# Patient Record
Sex: Female | Born: 1955 | Race: White | Hispanic: No | Smoking: Former smoker
Health system: Southern US, Community
[De-identification: ages and names within clinical notes are randomized; demographics above are authoritative.]

## PROBLEM LIST (undated history)

## (undated) DIAGNOSIS — G8194 Hemiplegia, unspecified affecting left nondominant side: Secondary | ICD-10-CM

## (undated) DIAGNOSIS — I739 Peripheral vascular disease, unspecified: Secondary | ICD-10-CM

## (undated) DIAGNOSIS — I509 Heart failure, unspecified: Secondary | ICD-10-CM

## (undated) DIAGNOSIS — G919 Hydrocephalus, unspecified: Secondary | ICD-10-CM

## (undated) DIAGNOSIS — I639 Cerebral infarction, unspecified: Secondary | ICD-10-CM

## (undated) DIAGNOSIS — J45909 Unspecified asthma, uncomplicated: Secondary | ICD-10-CM

## (undated) DIAGNOSIS — K5900 Constipation, unspecified: Secondary | ICD-10-CM

## (undated) DIAGNOSIS — I10 Essential (primary) hypertension: Secondary | ICD-10-CM

## (undated) DIAGNOSIS — E079 Disorder of thyroid, unspecified: Secondary | ICD-10-CM

## (undated) DIAGNOSIS — I619 Nontraumatic intracerebral hemorrhage, unspecified: Secondary | ICD-10-CM

## (undated) DIAGNOSIS — C50919 Malignant neoplasm of unspecified site of unspecified female breast: Secondary | ICD-10-CM

## (undated) DIAGNOSIS — K219 Gastro-esophageal reflux disease without esophagitis: Secondary | ICD-10-CM

## (undated) HISTORY — DX: Heart failure, unspecified: I50.9

## (undated) HISTORY — PX: BRAIN SURGERY: SHX531

## (undated) HISTORY — DX: Cerebral infarction, unspecified: I63.9

---

## 2005-12-19 ENCOUNTER — Emergency Department: Payer: Self-pay | Admitting: Emergency Medicine

## 2005-12-19 ENCOUNTER — Other Ambulatory Visit: Payer: Self-pay

## 2006-01-30 ENCOUNTER — Encounter: Payer: Self-pay | Admitting: Neurosurgery

## 2010-12-31 ENCOUNTER — Inpatient Hospital Stay: Payer: Self-pay | Admitting: Surgery

## 2011-01-11 ENCOUNTER — Ambulatory Visit: Payer: Self-pay | Admitting: Internal Medicine

## 2011-01-12 LAB — CANCER ANTIGEN 27.29: CA 27.29: 153.9 U/mL — ABNORMAL HIGH (ref 0.0–38.6)

## 2011-01-16 ENCOUNTER — Ambulatory Visit: Payer: Self-pay | Admitting: Internal Medicine

## 2011-01-23 ENCOUNTER — Ambulatory Visit: Payer: Self-pay | Admitting: Surgery

## 2011-02-10 ENCOUNTER — Ambulatory Visit: Payer: Self-pay | Admitting: Internal Medicine

## 2011-03-13 ENCOUNTER — Ambulatory Visit: Payer: Self-pay | Admitting: Internal Medicine

## 2011-04-13 ENCOUNTER — Ambulatory Visit: Payer: Self-pay | Admitting: Internal Medicine

## 2011-05-13 ENCOUNTER — Ambulatory Visit: Payer: Self-pay | Admitting: Internal Medicine

## 2011-06-13 ENCOUNTER — Ambulatory Visit: Payer: Self-pay | Admitting: Internal Medicine

## 2011-07-09 ENCOUNTER — Ambulatory Visit: Payer: Self-pay | Admitting: Surgery

## 2011-07-09 DIAGNOSIS — I10 Essential (primary) hypertension: Secondary | ICD-10-CM

## 2011-07-15 ENCOUNTER — Ambulatory Visit: Payer: Self-pay | Admitting: Surgery

## 2011-07-18 LAB — PATHOLOGY REPORT

## 2011-10-01 ENCOUNTER — Ambulatory Visit: Payer: Self-pay | Admitting: Internal Medicine

## 2011-10-11 ENCOUNTER — Ambulatory Visit: Payer: Self-pay | Admitting: Internal Medicine

## 2011-10-29 LAB — CBC CANCER CENTER
Basophil #: 0 x10 3/mm (ref 0.0–0.1)
HCT: 40 % (ref 35.0–47.0)
HGB: 13.4 g/dL (ref 12.0–16.0)
Lymphocyte #: 1 x10 3/mm (ref 1.0–3.6)
MCHC: 33.6 g/dL (ref 32.0–36.0)
Monocyte %: 8.6 %
RBC: 4.37 10*6/uL (ref 3.80–5.20)
WBC: 7.1 x10 3/mm (ref 3.6–11.0)

## 2011-11-05 LAB — CBC CANCER CENTER
Eosinophil #: 0.2 x10 3/mm (ref 0.0–0.7)
Eosinophil %: 3.5 %
MCH: 31 pg (ref 26.0–34.0)
Neutrophil #: 4.9 x10 3/mm (ref 1.4–6.5)
Platelet: 318 x10 3/mm (ref 150–440)

## 2011-11-11 ENCOUNTER — Ambulatory Visit: Payer: Self-pay | Admitting: Internal Medicine

## 2011-11-12 LAB — CBC CANCER CENTER
Eosinophil #: 0.2 x10 3/mm (ref 0.0–0.7)
Lymphocyte %: 16.6 %
Monocyte %: 9.4 %
Neutrophil %: 69.5 %
Platelet: 340 x10 3/mm (ref 150–440)
WBC: 6.1 x10 3/mm (ref 3.6–11.0)

## 2011-12-11 ENCOUNTER — Ambulatory Visit: Payer: Self-pay | Admitting: Internal Medicine

## 2012-10-09 IMAGING — PT NM PET TUM IMG RESTAG (PS) SKULL BASE T - THIGH
5 series · 23 of 25 positions shown · non-contrast
Comparison: none

REASON FOR EXAM: left breast cancer
COMMENTS:

[Series 3: ct wb 3.0 b30f · axial · 3.0mm · 0.98mm/px · z∈[-1355,-487]mm · 8 of 435 slices shown]
[im 1/435]
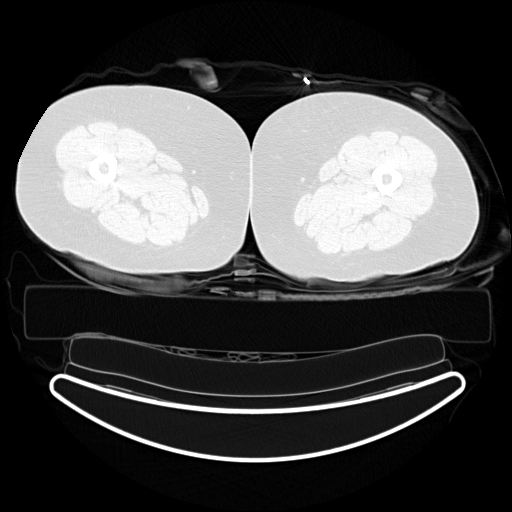
[im 55/435]
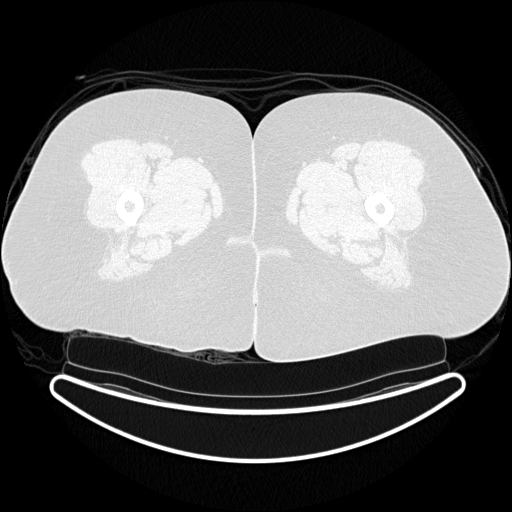
[im 109/435]
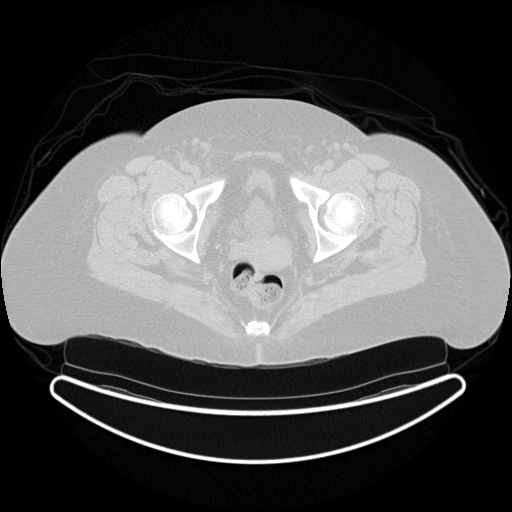
[im 163/435]
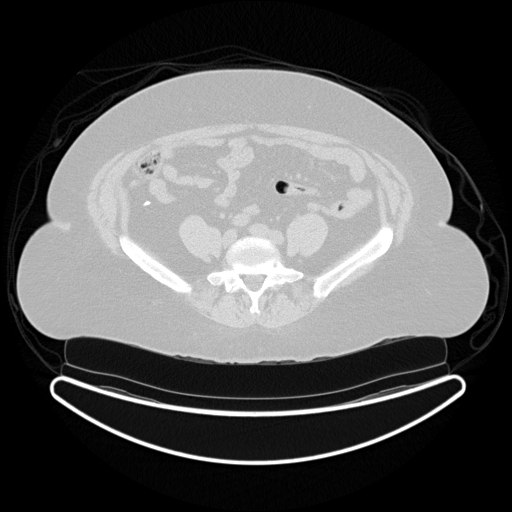
[im 218/435]
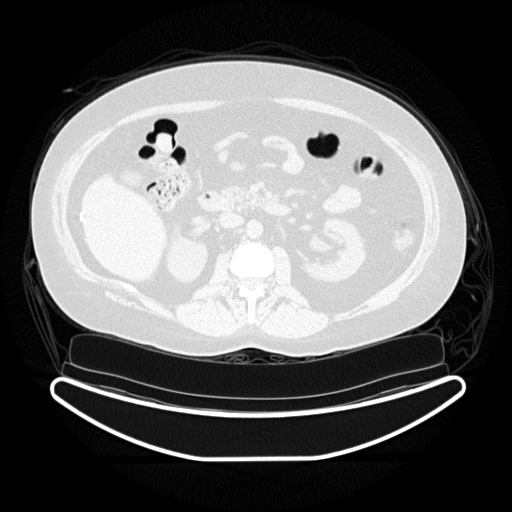
[im 272/435]
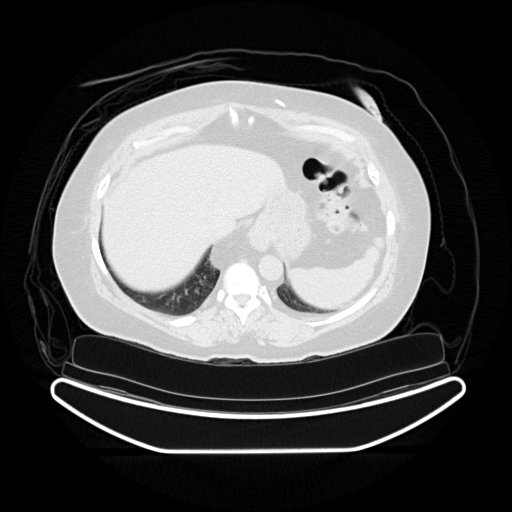
[im 380/435]
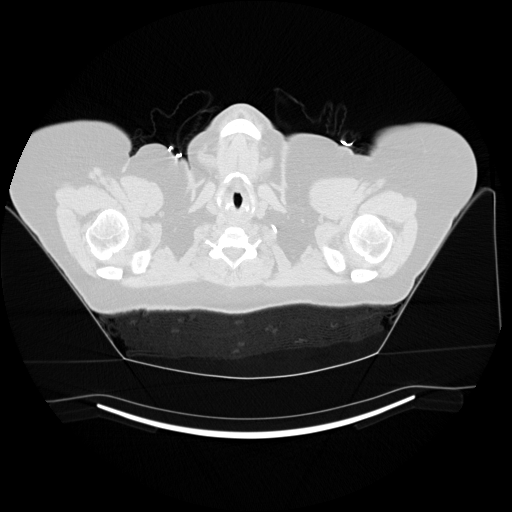
[im 435/435]
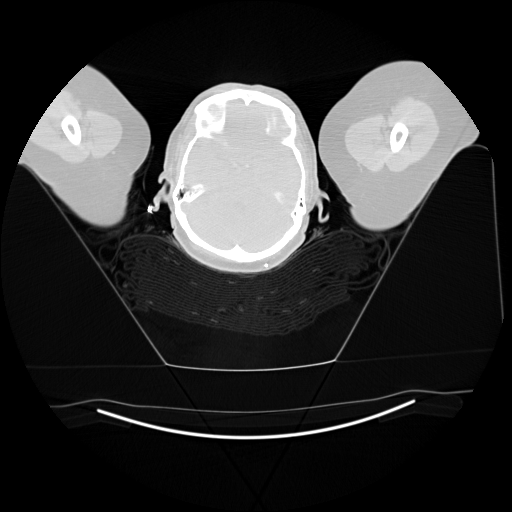

[Series 102: pet wb · axial · 5.0mm · 4.07mm/px · z∈[-1354,-487]mm · 7 of 290 slices shown]
[im 1/290]
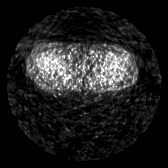
[im 49/290]
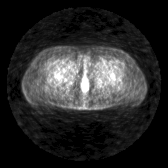
[im 97/290]
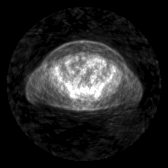
[im 145/290]
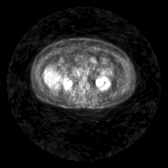
[im 193/290]
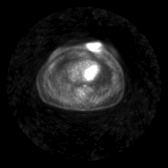
[im 241/290]
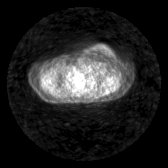
[im 290/290]
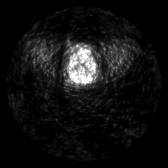

[Series 803: pet axial · 3 of 168 slices shown]
[im 1/168]
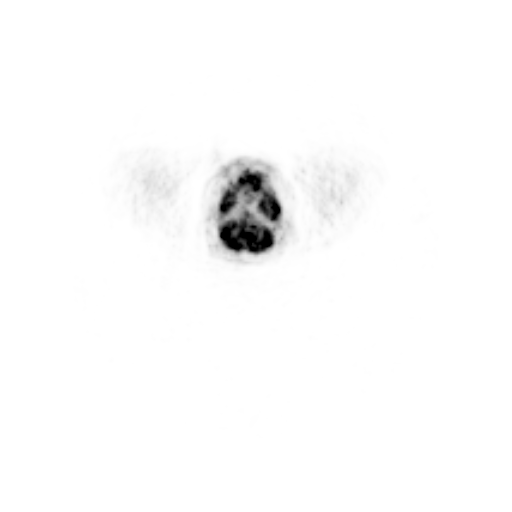
[im 56/168]
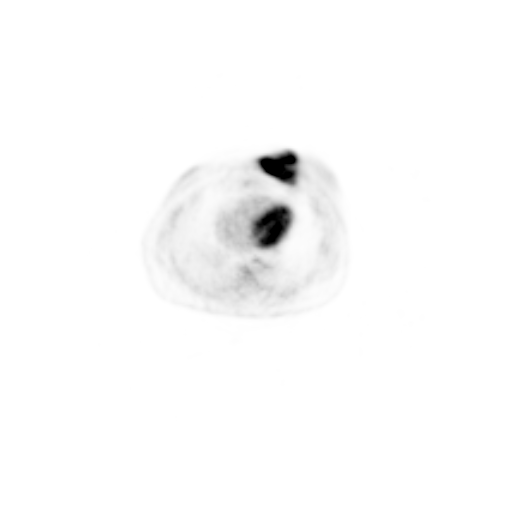
[im 168/168]
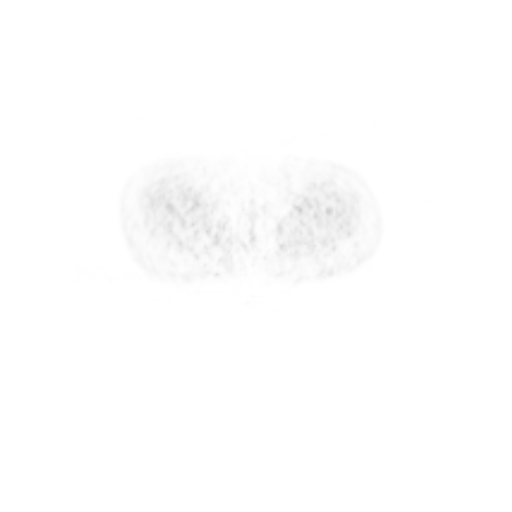

[Series 804: pet coronal · 2 of 74 slices shown]
[im 1/74]
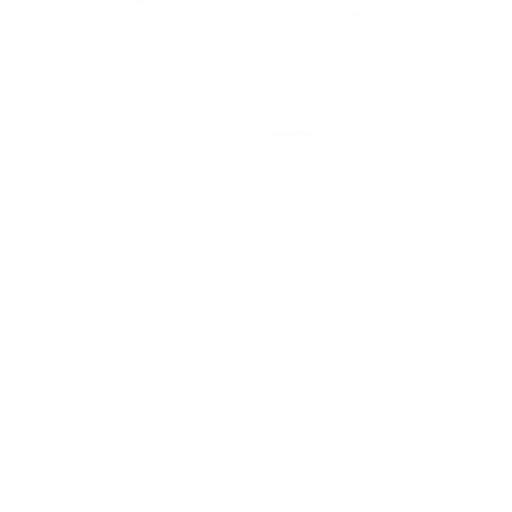
[im 74/74]
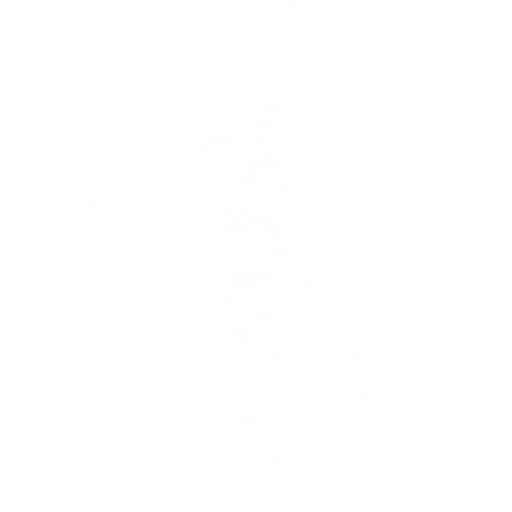

[Series 805: pet sagittal · 3 of 112 slices shown]
[im 1/112]
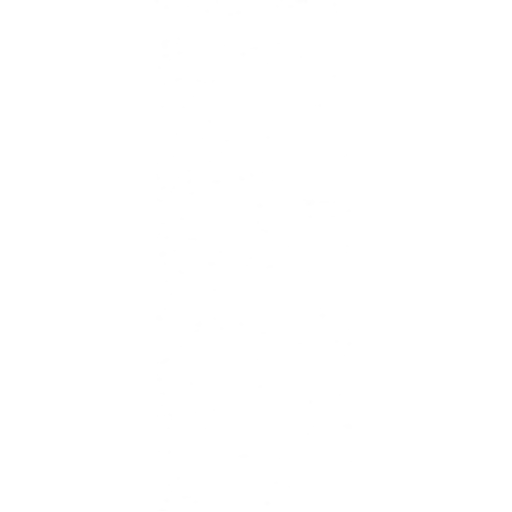
[im 56/112]
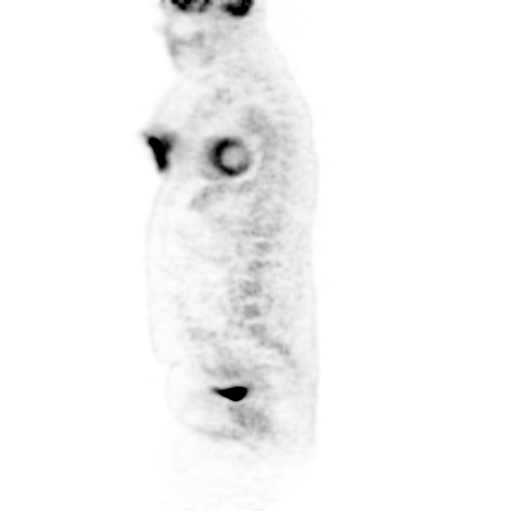
[im 112/112]
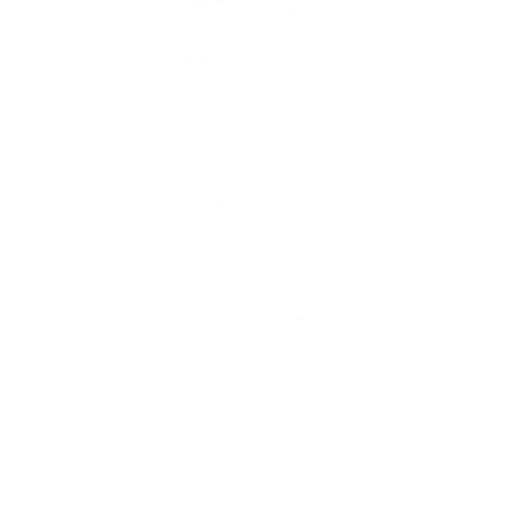

[23 of 25 positions shown; findings below may reference images not displayed]

PROCEDURE:     PET - PET/CT RESTG BREAST CA  - January 16, 2011 [DATE]

RESULT:     The patient is undergoing staging of breast malignancy. The
patient underwent biopsy 10 days ago on the left. The patient's fasting
blood glucose level was 100 mg/dL. The patient received 12.9 mCi of F-18
labeled FDG at [DATE] a.m. Scanning was then performed from [DATE] to [DATE]
a.m. A noncontrast CT scan was performed at the same sitting for
coregistration attenuation correction.

Uptake of the radiopharmaceutical within the neck is normal.

The left breast is enlarged and there is a large mass associated with the
medial aspect of the breast. This mass avidly accumulates the
radiopharmaceutical. A central area of photopenia may reflect the presence
of necrosis. The maximal SUV value demonstrated here is 19 with a mean of
16. I do not see abnormal uptake of the radiopharmaceutical elsewhere in the
left breast nor within the normal sized right breast. No abnormal uptake in
the axilla is demonstrated. Within the thoracic cavity I see no abnormal
uptake in the mediastinum or hilar regions nor pulmonary parenchymal.

Within the abdomen and pelvis normal expected activity within the urinary
bladder and kidneys is seen. There is a tiny focus to the right of the
urinary bladder posteriorly in the pelvis which does not clearly correspond
to a lymph node. It exhibits SUV of 9.6 maximally and 6.7 as a mean. This
likely reflects activity within the distal ureter.

On the CT images the thyroid gland is seen to be heterogeneous in density. I
see no mediastinal or hilar lymphadenopathy nor pleural nor pericardial
effusion. There are no adrenal masses. The kidney are normal in appearance.
The liver and gallbladder exhibit no acute abnormality. I see no periaortic
or pericaval lymphadenopathy. There is no evidence of ascites.
IMPRESSION: 1. There is markedly abnormal uptake within the known mass within the left
breast. I do not see definite abnormal uptake elsewhere within the body. A
tiny focus of increased uptake within the right aspect of the pelvis is
nonspecific and may be related to the distal right ureter.
2. I do not see evidence of acute abnormality on the CT images elsewhere in
the chest or abdomen or pelvis. There is nodularity within the normal size
thyroid gland.

## 2012-11-05 ENCOUNTER — Inpatient Hospital Stay (HOSPITAL_COMMUNITY)
Admission: EM | Admit: 2012-11-05 | Discharge: 2012-11-09 | DRG: 556 | Disposition: A | Discharge status: Rehabilitation Facility | Payer: Medicaid Other | Attending: Internal Medicine | Admitting: Internal Medicine

## 2012-11-05 ENCOUNTER — Observation Stay (HOSPITAL_COMMUNITY): Payer: Medicaid Other

## 2012-11-05 ENCOUNTER — Emergency Department (HOSPITAL_COMMUNITY): Payer: Medicaid Other

## 2012-11-05 ENCOUNTER — Encounter (HOSPITAL_COMMUNITY): Payer: Self-pay | Admitting: Emergency Medicine

## 2012-11-05 DIAGNOSIS — I6350 Cerebral infarction due to unspecified occlusion or stenosis of unspecified cerebral artery: Secondary | ICD-10-CM

## 2012-11-05 DIAGNOSIS — E669 Obesity, unspecified: Secondary | ICD-10-CM | POA: Diagnosis present

## 2012-11-05 DIAGNOSIS — Z9889 Other specified postprocedural states: Secondary | ICD-10-CM

## 2012-11-05 DIAGNOSIS — Z8679 Personal history of other diseases of the circulatory system: Secondary | ICD-10-CM

## 2012-11-05 DIAGNOSIS — R29898 Other symptoms and signs involving the musculoskeletal system: Principal | ICD-10-CM | POA: Diagnosis present

## 2012-11-05 DIAGNOSIS — M6281 Muscle weakness (generalized): Secondary | ICD-10-CM

## 2012-11-05 DIAGNOSIS — Z982 Presence of cerebrospinal fluid drainage device: Secondary | ICD-10-CM

## 2012-11-05 DIAGNOSIS — R4789 Other speech disturbances: Secondary | ICD-10-CM | POA: Diagnosis present

## 2012-11-05 DIAGNOSIS — J45909 Unspecified asthma, uncomplicated: Secondary | ICD-10-CM | POA: Diagnosis not present

## 2012-11-05 DIAGNOSIS — R531 Weakness: Secondary | ICD-10-CM | POA: Diagnosis present

## 2012-11-05 DIAGNOSIS — I10 Essential (primary) hypertension: Secondary | ICD-10-CM | POA: Diagnosis present

## 2012-11-05 DIAGNOSIS — K219 Gastro-esophageal reflux disease without esophagitis: Secondary | ICD-10-CM | POA: Diagnosis present

## 2012-11-05 DIAGNOSIS — I639 Cerebral infarction, unspecified: Secondary | ICD-10-CM

## 2012-11-05 DIAGNOSIS — Z853 Personal history of malignant neoplasm of breast: Secondary | ICD-10-CM

## 2012-11-05 HISTORY — DX: Malignant neoplasm of unspecified site of unspecified female breast: C50.919

## 2012-11-05 HISTORY — DX: Essential (primary) hypertension: I10

## 2012-11-05 HISTORY — DX: Unspecified asthma, uncomplicated: J45.909

## 2012-11-05 HISTORY — DX: Hydrocephalus, unspecified: G91.9

## 2012-11-05 LAB — BASIC METABOLIC PANEL
BUN: 11 mg/dL (ref 6–23)
CO2: 25 mEq/L (ref 19–32)
Chloride: 108 mEq/L (ref 96–112)
GFR calc non Af Amer: 69 mL/min — ABNORMAL LOW (ref >90)
Glucose, Bld: 95 mg/dL (ref 70–99)
Potassium: 3.9 mEq/L (ref 3.5–5.1)
Sodium: 144 mEq/L (ref 135–145)

## 2012-11-05 LAB — CBC
Hemoglobin: 12.2 g/dL (ref 12.0–15.0)
MCH: 31.6 pg (ref 26.0–34.0)
MCHC: 33.6 g/dL (ref 30.0–36.0)
MCV: 94 fL (ref 78.0–100.0)
RBC: 3.86 MIL/uL — ABNORMAL LOW (ref 3.87–5.11)

## 2012-11-05 LAB — CBC WITH DIFFERENTIAL/PLATELET
Eosinophils Absolute: 0.1 10*3/uL (ref 0.0–0.7)
HCT: 41.7 % (ref 36.0–46.0)
Hemoglobin: 14 g/dL (ref 12.0–15.0)
Lymphs Abs: 1.7 10*3/uL (ref 0.7–4.0)
MCH: 31.7 pg (ref 26.0–34.0)
MCHC: 33.6 g/dL (ref 30.0–36.0)
MCV: 94.6 fL (ref 78.0–100.0)
Monocytes Absolute: 0.6 10*3/uL (ref 0.1–1.0)
Monocytes Relative: 6 % (ref 3–12)
Neutrophils Relative %: 73 % (ref 43–77)
RBC: 4.41 MIL/uL (ref 3.87–5.11)

## 2012-11-05 LAB — TROPONIN I: Troponin I: <0.3 ng/mL (ref <0.30)

## 2012-11-05 MED ORDER — METOPROLOL SUCCINATE ER 50 MG PO TB24
50.0000 mg | ORAL_TABLET | Freq: Every morning | ORAL | Status: DC
  Administered 2012-11-06 – 2012-11-09 (×4): 50 mg via ORAL
  Filled 2012-11-05 (×5): qty 1

## 2012-11-05 MED ORDER — ASPIRIN 325 MG PO TABS
325.0000 mg | ORAL_TABLET | Freq: Every day | ORAL | Status: DC
  Administered 2012-11-05 – 2012-11-09 (×5): 325 mg via ORAL
  Filled 2012-11-05 (×6): qty 1

## 2012-11-05 MED ORDER — IPRATROPIUM-ALBUTEROL 18-103 MCG/ACT IN AERO
2.0000 | INHALATION_SPRAY | Freq: Four times a day (QID) | RESPIRATORY_TRACT | Status: DC | PRN
  Filled 2012-11-05: qty 14.7

## 2012-11-05 MED ORDER — PANTOPRAZOLE SODIUM 40 MG PO TBEC
40.0000 mg | DELAYED_RELEASE_TABLET | Freq: Every day | ORAL | Status: DC
  Administered 2012-11-06 – 2012-11-09 (×4): 40 mg via ORAL
  Filled 2012-11-05 (×5): qty 1

## 2012-11-05 MED ORDER — ONDANSETRON HCL 4 MG/2ML IJ SOLN: 4.0000 mg | Freq: Three times a day (TID) | INTRAMUSCULAR | Status: AC | PRN

## 2012-11-05 MED ORDER — ASPIRIN 300 MG RE SUPP
300.0000 mg | Freq: Every day | RECTAL | Status: DC
Start: 2012-11-05 — End: 2012-11-09
  Filled 2012-11-05 (×5): qty 1

## 2012-11-05 MED ORDER — ONDANSETRON HCL 4 MG/2ML IJ SOLN
4.0000 mg | Freq: Four times a day (QID) | INTRAMUSCULAR | Status: DC | PRN
  Filled 2012-11-05: qty 2

## 2012-11-05 MED ORDER — ENOXAPARIN SODIUM 40 MG/0.4ML ~~LOC~~ SOLN
40.0000 mg | SUBCUTANEOUS | Status: DC
  Administered 2012-11-05 – 2012-11-08 (×4): 40 mg via SUBCUTANEOUS
  Filled 2012-11-05 (×5): qty 0.4

## 2012-11-05 MED ORDER — SODIUM CHLORIDE 0.9 % IV BOLUS (SEPSIS)
1000.0000 mL | Freq: Once | INTRAVENOUS | Status: AC
  Administered 2012-11-05: 1000 mL via INTRAVENOUS

## 2012-11-05 NOTE — ED Notes (Signed)
Pt states she has numbness on left side; pt states it started yesterday and states the left side did not act like it was suppose to; pts sister at bedside states that the pt speech is "prolonged." pt denies change in her speech; pt alert and mentating appropriately; pt states she feels like her left side is heavy

## 2012-11-05 NOTE — ED Notes (Signed)
States only has partial feeling in left side strated yesterday  Also having left sided facial droop and speech slurred yesterday. Weak on one side

## 2012-11-05 NOTE — ED Notes (Signed)
Pt alert and mentating appropriately; pt denies pain; pt states she has some lightheadedness; pt denies dizziness; pt denies n/v/d; pt denies fevers and chills; pt c/o weakness and numbness on left side

## 2012-11-05 NOTE — ED Notes (Signed)
Report given to floor nurse, Evea, RN; Nurse has no further questions upon report given; pt being prepared for transport after EKG completed

## 2012-11-05 NOTE — H&P (Signed)
[facility] History and Physical  Desiree Mitchell ZOX:[id number] DOB: 1955/09/27 DOA: 11/05/2012  Referring physician: Dr. Rhunette Croft PCP: No primary provider on file.   Chief Complaint: left side weakness and chest discomfort.  HPI: Desiree Mitchell is a 57 y.o. female PMH of breast cancer (s/p bilat mastectomy and chemotherapy; currently in remision and no receiving any treatment), HTN, and hydrocephalus (with VP shunt placement); came to ED after experiencing sudden onset of left side weakness and numbness. Patient reports symptoms first started Tuesday night (only her left leg was feeling heavy); then on Wednesday symptoms and heavy left side sensation worsen and start affecting her arm; patient decline seeking medical attention at that moment. On day of admission 11/05/12 patient unable move her left side, unable to walk w/o stumbling and also per family members started having difficulty speaking (some slurred speech and articulation difficulties). No code stroke activated due to delay in presentation. CT scan neg in ED; [facility] called to admit patient for further evaluation and treatment.  Of note, on ROS patient reports some mid epigastric/chest discomfort, but no palpitations; burning in nature and w/o radiation.  Patient denies SOB, nausea, vomiting, abdominal pain, fever, chills, headaches, blurred vision, diplopia or any other complaints.   Review of Systems:  Negative except as mentioned on HPI.  Past Medical History  Diagnosis Date  . Hypertension    Past Surgical History  Procedure Laterality Date  . Brain surgery      stent in head   Social History:  reports that she has never smoked. She does not have any smokeless tobacco history on file. She reports that  drinks alcohol. Her drug history is not on file. lives at home with her sisters; no need for assistance with ADL's prior to new deficit.  Allergies  Allergen Reactions  . Tape     blisters    Family Hx: significant for  HTN  Prior to Admission medications   Medication Sig Start Date End Date Taking? Authorizing Provider  acetaminophen (TYLENOL) 325 MG tablet Take 650 mg by mouth every 6 (six) hours as needed for pain.   Yes Historical Provider, MD  albuterol-ipratropium (COMBIVENT) 18-103 MCG/ACT inhaler Inhale 2 puffs into the lungs every 6 (six) hours as needed for wheezing.   Yes Historical Provider, MD  diphenhydramine-acetaminophen (TYLENOL PM) 25-500 MG TABS Take 1 tablet by mouth at bedtime as needed (to sleep).   Yes Historical Provider, MD  metoprolol succinate (TOPROL-XL) 50 MG 24 hr tablet Take 50 mg by mouth every morning. Take with or immediately following a meal.   Yes Historical Provider, MD   Physical Exam: Filed Vitals:   11/05/12 1600 11/05/12 1615 11/05/12 1642 11/05/12 1645  BP: 156/73 163/80  162/77  Pulse: 70 83  80  Temp:   98.7 F (37.1 C) 98.7 F (37.1 C)  TempSrc:    Oral  Resp: 18 18  20   SpO2: 97% 99%  99%     General:  Able to speak in full sentences, no CP or SOB; patient mild left facial droop and complaining of left side weakness and facial numbness  Eyes: perrl, no icterus, no nystagmus, EOMI  ENT: mois MM, no erythema or exudates, fair dentition  Neck: supple, no thyromegaly or bruits  Cardiovascular: no murmurs, no rubs, no gallops, S1 and S2  Respiratory: CTA bilaterally  Abdomen: obese, soft, NT, ND, positive BS  Skin: no rash or petechiae  Musculoskeletal: discomfort on her LE on palpation left > right; trace  edema bilaterally   Psychiatric: appropriate and with stable mood  Neurologic: CN intact, left side numbness described on exam; also with MS 2-3/5 affecting LUE and LLE (leg more than arm); right side 5/5; gait not evaluated. Normal speech. Tongue and uvula midline. AAOX3  Labs on Admission:  Basic Metabolic Panel:  Recent Labs Lab 11/05/12 1328  NA 144  K 3.9  CL 108  CO2 25  GLUCOSE 95  BUN 11  CREATININE 0.92  CALCIUM 9.1    CBC:  Recent Labs Lab 11/05/12 1328  WBC 8.6  NEUTROABS 6.3  HGB 14.0  HCT 41.7  MCV 94.6  PLT 315   Radiological Exams on Admission: Ct Head Wo Contrast  11/05/2012  *RADIOLOGY REPORT*  Clinical Data: Left side numbness.  CT HEAD WITHOUT CONTRAST  Technique:  Contiguous axial images were obtained from the base of the skull through the vertex without contrast.  Comparison: None.  Findings: The patient has a left parietal approach ventriculostomy shunt catheter.  The catheter just crosses the midline.  The ventricles are decompressed.  Hypoattenuation in the subcortical and periventricular deep white matter is identified.  There is no evidence of acute abnormality including infarct, hemorrhage, mass lesion, mass effect, midline shift or abnormal extra-axial fluid collection.  No hydrocephalus or pneumocephalus.  Calvarium intact.  IMPRESSION: No acute finding with a ventriculostomy shunt catheter in place. Chronic microvascular ischemic change noted.   Original Report Authenticated By: Holley Dexter, M.D.     EKG: SNR; no acute ischemic changes.  Assessment/Plan 1-Weakness of left side of body: sudden and still present on exam. Patient reports symptoms started about 24-30 hours prior to admission. Not TPA candidate due to dilate on presentation and hx of cerebral shunt. -admit to telemetry -MRI/MRA if possible (radiology will investigate if patient can have procedure or not) -ASA for secondary prevention -neurology consulted will follow recommendations -PT/OT/SLP -will check A1C, lipid panel, TSH and B12  2-HTN (hypertension): continue Toprol. Will be permissive with mild HTN in setting of acute CVA  3-H/O cerebral aneurysm repair: no coils seen on CT head. Patient VP shunt. Per radiologist he thinks patient will be able to have MRI.  4-Obesity: low calorie diet discussed  5-Asthma: stable, no SOB. Continue PRN combivent  6-Chest discomfort: appears to be related to  GERD. -start PPI -cycle CE'z -check EKG  7-LE discomfort and mild swelling: L > R; will get LE dopplers.  WUJ:[id number].  Neurology has been consulted by ED.  Code Status: Full Family Communication: sisters at bedside Disposition Plan: admit to telemetry inpatient status for further evaluation and treatment of left side weakness; LOS > 2 midnights. Patient appears to have acute CVA.  Time spent: >30 minutes  Trishia Cuthrell [facility] Pager [phone]  If 7PM-7AM, please contact night-coverage [email or web address] Password [facility] 11/05/2012, 5:32 PM

## 2012-11-05 NOTE — ED Notes (Signed)
MD at bedside. 

## 2012-11-06 ENCOUNTER — Encounter (HOSPITAL_COMMUNITY): Payer: Self-pay | Admitting: Neurology

## 2012-11-06 ENCOUNTER — Observation Stay (HOSPITAL_COMMUNITY): Payer: Medicaid Other

## 2012-11-06 DIAGNOSIS — I519 Heart disease, unspecified: Secondary | ICD-10-CM

## 2012-11-06 DIAGNOSIS — R4789 Other speech disturbances: Secondary | ICD-10-CM

## 2012-11-06 DIAGNOSIS — M79609 Pain in unspecified limb: Secondary | ICD-10-CM

## 2012-11-06 DIAGNOSIS — M6281 Muscle weakness (generalized): Secondary | ICD-10-CM

## 2012-11-06 LAB — URINALYSIS, ROUTINE W REFLEX MICROSCOPIC
Nitrite: NEGATIVE
Specific Gravity, Urine: 1.012 (ref 1.005–1.030)
Urobilinogen, UA: 1 mg/dL (ref 0.0–1.0)
pH: 6.5 (ref 5.0–8.0)

## 2012-11-06 LAB — TSH: TSH: 1.355 u[IU]/mL (ref 0.350–4.500)

## 2012-11-06 LAB — HEMOGLOBIN A1C: Hgb A1c MFr Bld: 6.1 % — ABNORMAL HIGH (ref <5.7)

## 2012-11-06 LAB — VITAMIN B12: Vitamin B-12: 269 pg/mL (ref 211–911)

## 2012-11-06 LAB — LIPID PANEL
Cholesterol: 180 mg/dL (ref 0–200)
HDL: 39 mg/dL — ABNORMAL LOW (ref >39)
LDL Cholesterol: 109 mg/dL — ABNORMAL HIGH (ref 0–99)
Triglycerides: 162 mg/dL — ABNORMAL HIGH (ref <150)

## 2012-11-06 LAB — URINE MICROSCOPIC-ADD ON

## 2012-11-06 MED ORDER — SIMVASTATIN 40 MG PO TABS
40.0000 mg | ORAL_TABLET | Freq: Every day | ORAL | Status: DC
  Administered 2012-11-06 – 2012-11-08 (×3): 40 mg via ORAL
  Filled 2012-11-06 (×5): qty 1

## 2012-11-06 NOTE — Progress Notes (Signed)
  Echocardiogram 2D Echocardiogram has been performed.  [place] 11/06/2012, 10:42 AM

## 2012-11-06 NOTE — Consult Note (Signed)
NEURO HOSPITALIST CONSULT NOTE    Reason for Consult: left sided weakness  HPI:                                                                                                                                          Desiree Mitchell is an 57 y.o. female who noted 3 days ago that " she had a cramp in her left calf".  This cramp then progressed to her left leg and arm over a 24-48 hour period.  Yesterday her sister felt she had a left facial droop and patient felt she then had weakness of her left leg, arm and face associated with left sided decreased sensation in same distribution. Patient states her symptoms have not changed.  She denies any new stressors in her life.  She is no on ASA at home. Initial CT was unremarkable for mass or bleed in brain.   Past Medical History  Diagnosis Date  . Hypertension   . Breast cancer   . Hydrocephalus   . Asthma     Past Surgical History  Procedure Laterality Date  . Brain surgery      Shunt placement    Family History: Mother HTN, DM Father HTN  Social History:  reports that she has never smoked. She does not have any smokeless tobacco history on file. She reports that  drinks alcohol. Her drug history is not on file.  Allergies  Allergen Reactions  . Tape     blisters    MEDICATIONS:                                                                                                                     Prior to Admission:  Prescriptions prior to admission  Medication Sig Dispense Refill  . acetaminophen (TYLENOL) 325 MG tablet Take 650 mg by mouth every 6 (six) hours as needed for pain.      Marland Kitchen albuterol-ipratropium (COMBIVENT) 18-103 MCG/ACT inhaler Inhale 2 puffs into the lungs every 6 (six) hours as needed for wheezing.      . diphenhydramine-acetaminophen (TYLENOL PM) 25-500 MG TABS Take 1 tablet by mouth at bedtime as needed (to sleep).      . metoprolol succinate (TOPROL-XL) 50 MG 24 hr tablet Take 50 mg by mouth  every morning. Take with or immediately following a meal.       Scheduled: . aspirin  300 mg Rectal Daily   Or  . aspirin  325 mg Oral Daily  . enoxaparin (LOVENOX) injection  40 mg Subcutaneous Q24H  . metoprolol succinate  50 mg Oral q morning - 10a  . pantoprazole  40 mg Oral Q1200     ROS:                                                                                                                                       History obtained from the patient  General ROS: negative for - chills, fatigue, fever, night sweats, weight gain or weight loss Psychological ROS: negative for - behavioral disorder, hallucinations, memory difficulties, mood swings or suicidal ideation Ophthalmic ROS: negative for - blurry vision, double vision, eye pain or loss of vision ENT ROS: negative for - epistaxis, nasal discharge, oral lesions, sore throat, tinnitus or vertigo Allergy and Immunology ROS: negative for - hives or itchy/watery eyes Hematological and Lymphatic ROS: negative for - bleeding problems, bruising or swollen lymph nodes Endocrine ROS: negative for - galactorrhea, hair pattern changes, polydipsia/polyuria or temperature intolerance Respiratory ROS: negative for - cough, hemoptysis, shortness of breath or wheezing Cardiovascular ROS: negative for - chest pain, dyspnea on exertion, edema or irregular heartbeat Gastrointestinal ROS: negative for - abdominal pain, diarrhea, hematemesis, nausea/vomiting or stool incontinence Genito-Urinary ROS: negative for - dysuria, hematuria, incontinence or urinary frequency/urgency Musculoskeletal ROS: negative for - joint swelling or muscular weakness Neurological ROS: as noted in HPI Dermatological ROS: negative for rash and skin lesion changes   Blood pressure 150/90, pulse 74, temperature 98.6 F (37 C), temperature source Oral, resp. rate 18, SpO2 98.00%.   Neurologic Examination:                                                                                                       Mental Status: Alert, oriented, thought content appropriate.  Speech fluent without evidence of aphasia.  Able to follow 3 step commands without difficulty. Cranial Nerves: II: Discs flat bilaterally; Visual fields grossly normal, pupils equal, round, reactive to light and accommodation III,IV, VI: ptosis not present, extra-ocular motions intact bilaterally V,VII: smile and face symmetric until I started to examine patient at this point she would hold left portion of face still.  No air leak in left aspect of face. facial light touch sensation decreased along left aspect of face and  splits midline to tuning fork along face and trunk.  VIII: hearing normal bilaterally IX,X: gag reflex present XI: bilateral shoulder shrug XII: midline tongue extension Motor: Upon walking in to room she is holding a Ipad in her left hand above the bed about 3 feet with no difficulty and talking to me with symmetrical face.  As I started to examin patient she states she cannot move her left arm off the bed. I held her arm off the bed and asked her to push down with left arm--she preceded to hold the arm 3 feet off the bed and state she could not lower her arm. Left grip showed minimal effort and left lower extremity showed 5/5 strength with intermittent effort and give way strength. Right UE and LE full 5/5  Sensory: splits midline sensation to LT and tuning fork from sternum to umbilicus.  Deep Tendon Reflexes: 2+ and symmetric throughout Plantars: Right: downgoing   Left: downgoing Cerebellar: normal finger-to-nose,  normal heel-to-shin test CV: pulses palpable throughout    Lab Results  Component Value Date/Time   CHOL 180 11/06/2012  6:40 AM    Results for orders placed during the hospital encounter of 11/05/12 (from the past 48 hour(s))  BASIC METABOLIC PANEL     Status: Abnormal   Collection Time    11/05/12  1:28 PM      Result Value Range   Sodium 144  135 -  145 mEq/L   Potassium 3.9  3.5 - 5.1 mEq/L   Chloride 108  96 - 112 mEq/L   CO2 25  19 - 32 mEq/L   Glucose, Bld 95  70 - 99 mg/dL   BUN 11  6 - 23 mg/dL   Creatinine, Ser 4.54  0.50 - 1.10 mg/dL   Calcium 9.1  8.4 - 09.8 mg/dL   GFR calc non Af Amer 69 (*) >90 mL/min   GFR calc Af Amer 80 (*) >90 mL/min   Comment:            The eGFR has been calculated     using the CKD EPI equation.     This calculation has not been     validated in all clinical     situations.     eGFR's persistently     <90 mL/min signify     possible Chronic Kidney Disease.  CBC WITH DIFFERENTIAL     Status: None   Collection Time    11/05/12  1:28 PM      Result Value Range   WBC 8.6  4.0 - 10.5 K/uL   RBC 4.41  3.87 - 5.11 MIL/uL   Hemoglobin 14.0  12.0 - 15.0 g/dL   HCT 11.9  14.7 - 82.9 %   MCV 94.6  78.0 - 100.0 fL   MCH 31.7  26.0 - 34.0 pg   MCHC 33.6  30.0 - 36.0 g/dL   RDW 56.2  13.0 - 86.5 %   Platelets 315  150 - 400 K/uL   Neutrophils Relative 73  43 - 77 %   Neutro Abs 6.3  1.7 - 7.7 K/uL   Lymphocytes Relative 19  12 - 46 %   Lymphs Abs 1.7  0.7 - 4.0 K/uL   Monocytes Relative 6  3 - 12 %   Monocytes Absolute 0.6  0.1 - 1.0 K/uL   Eosinophils Relative 1  0 - 5 %   Eosinophils Absolute 0.1  0.0 - 0.7 K/uL   Basophils Relative 0  0 - 1 %   Basophils Absolute 0.0  0.0 - 0.1 K/uL  TROPONIN I     Status: None   Collection Time    11/05/12  7:05 PM      Result Value Range   Troponin I <0.30  <0.30 ng/mL   Comment:            Due to the release kinetics of cTnI,     a negative result within the first hours     of the onset of symptoms does not rule out     myocardial infarction with certainty.     If myocardial infarction is still suspected,     repeat the test at appropriate intervals.  CBC     Status: Abnormal   Collection Time    11/05/12  7:31 PM      Result Value Range   WBC 8.7  4.0 - 10.5 K/uL   RBC 3.86 (*) 3.87 - 5.11 MIL/uL   Hemoglobin 12.2  12.0 - 15.0 g/dL   HCT  09.6  04.5 - 40.9 %   MCV 94.0  78.0 - 100.0 fL   MCH 31.6  26.0 - 34.0 pg   MCHC 33.6  30.0 - 36.0 g/dL   RDW 81.1  91.4 - 78.2 %   Platelets 342  150 - 400 K/uL  CREATININE, SERUM     Status: Abnormal   Collection Time    11/05/12  7:31 PM      Result Value Range   Creatinine, Ser 0.95  0.50 - 1.10 mg/dL   GFR calc non Af Amer 66 (*) >90 mL/min   GFR calc Af Amer 77 (*) >90 mL/min   Comment:            The eGFR has been calculated     using the CKD EPI equation.     This calculation has not been     validated in all clinical     situations.     eGFR's persistently     <90 mL/min signify     possible Chronic Kidney Disease.  TSH     Status: None   Collection Time    11/05/12  7:31 PM      Result Value Range   TSH 1.355  0.350 - 4.500 uIU/mL  VITAMIN B12     Status: None   Collection Time    11/05/12  7:31 PM      Result Value Range   Vitamin B-12 269  211 - 911 pg/mL  TROPONIN I     Status: None   Collection Time    11/06/12 12:39 AM      Result Value Range   Troponin I <0.30  <0.30 ng/mL   Comment:            Due to the release kinetics of cTnI,     a negative result within the first hours     of the onset of symptoms does not rule out     myocardial infarction with certainty.     If myocardial infarction is still suspected,     repeat the test at appropriate intervals.  URINALYSIS, ROUTINE W REFLEX MICROSCOPIC     Status: Abnormal   Collection Time    11/06/12 12:46 AM      Result Value Range   Color, Urine YELLOW  YELLOW   APPearance CLOUDY (*) CLEAR   Specific Gravity, Urine 1.012  1.005 - 1.030   pH  6.5  5.0 - 8.0   Glucose, UA NEGATIVE  NEGATIVE mg/dL   Hgb urine dipstick NEGATIVE  NEGATIVE   Bilirubin Urine NEGATIVE  NEGATIVE   Ketones, ur NEGATIVE  NEGATIVE mg/dL   Protein, ur NEGATIVE  NEGATIVE mg/dL   Urobilinogen, UA 1.0  0.0 - 1.0 mg/dL   Nitrite NEGATIVE  NEGATIVE   Leukocytes, UA LARGE (*) NEGATIVE  URINE MICROSCOPIC-ADD ON     Status:  Abnormal   Collection Time    11/06/12 12:46 AM      Result Value Range   Squamous Epithelial / LPF FEW (*) RARE   WBC, UA 21-50  <3 WBC/hpf   RBC / HPF 0-2  <3 RBC/hpf   Bacteria, UA FEW (*) RARE   Urine-Other AMORPHOUS URATES/PHOSPHATES    HEMOGLOBIN A1C     Status: Abnormal   Collection Time    11/06/12  1:00 AM      Result Value Range   Hemoglobin A1C 6.1 (*) <5.7 %   Comment: (NOTE)                                                                               According to the ADA Clinical Practice Recommendations for 2011, when     HbA1c is used as a screening test:      >=6.5%   Diagnostic of Diabetes Mellitus               (if abnormal result is confirmed)     5.7-6.4%   Increased risk of developing Diabetes Mellitus     References:Diagnosis and Classification of Diabetes Mellitus,Diabetes     Care,2011,34(Suppl 1):S62-S69 and Standards of Medical Care in             Diabetes - 2011,Diabetes Care,2011,34 (Suppl 1):S11-S61.   Mean Plasma Glucose 128 (*) <117 mg/dL  TROPONIN I     Status: None   Collection Time    11/06/12  6:40 AM      Result Value Range   Troponin I <0.30  <0.30 ng/mL   Comment:            Due to the release kinetics of cTnI,     a negative result within the first hours     of the onset of symptoms does not rule out     myocardial infarction with certainty.     If myocardial infarction is still suspected,     repeat the test at appropriate intervals.  LIPID PANEL     Status: Abnormal   Collection Time    11/06/12  6:40 AM      Result Value Range   Cholesterol 180  0 - 200 mg/dL   Triglycerides 161 (*) <150 mg/dL   HDL 39 (*) >09 mg/dL   Total CHOL/HDL Ratio 4.6     VLDL 32  0 - 40 mg/dL   LDL Cholesterol 604 (*) 0 - 99 mg/dL   Comment:            Total Cholesterol/HDL:CHD Risk     Coronary Heart Disease Risk Table  Men   Women      1/2 Average Risk   3.4   3.3      Average Risk       5.0   4.4      2 X Average Risk    9.6   7.1      3 X Average Risk  23.4   11.0                Use the calculated Patient Ratio     above and the CHD Risk Table     to determine the patient's CHD Risk.                ATP III CLASSIFICATION (LDL):      <100     mg/dL   Optimal      161-096  mg/dL   Near or Above                        Optimal      130-159  mg/dL   Borderline      045-409  mg/dL   High      >811     mg/dL   Very High    Dg Chest 2 View  11/05/2012  *RADIOLOGY REPORT*  Clinical Data: CVA  CHEST - 2 VIEW  Comparison: None.  Findings: Right Port-A-Cath tip:  SVC. VP shunt tubing noted.  The patient is rotated to the right on today's exam, resulting in reduced diagnostic sensitivity and specificity.   Cardiomegaly noted without edema.  No pleural effusion identified.  IMPRESSION:  1.  Cardiomegaly without edema. 2.  Right Port-A-Cath tip:  SVC.   Original Report Authenticated By: Gaylyn Rong, M.D.    Ct Head Wo Contrast  11/05/2012  *RADIOLOGY REPORT*  Clinical Data: Left side numbness.  CT HEAD WITHOUT CONTRAST  Technique:  Contiguous axial images were obtained from the base of the skull through the vertex without contrast.  Comparison: None.  Findings: The patient has a left parietal approach ventriculostomy shunt catheter.  The catheter just crosses the midline.  The ventricles are decompressed.  Hypoattenuation in the subcortical and periventricular deep white matter is identified.  There is no evidence of acute abnormality including infarct, hemorrhage, mass lesion, mass effect, midline shift or abnormal extra-axial fluid collection.  No hydrocephalus or pneumocephalus.  Calvarium intact.  IMPRESSION: No acute finding with a ventriculostomy shunt catheter in place. Chronic microvascular ischemic change noted.   Original Report Authenticated By: Holley Dexter, M.D.    Vascular Ultrasound  Carotid Duplex (Doppler) has been completed.  There is no obvious evidence of hemodynamically significant carotid  artery stenosis >40%. Vertebral arteries are patent with antegrade flow   Assessment/Plan: 57 YO female with progressive left sided weakness and decreased sensation.  Exam findings not consistent with stroke and show inconsistencies in both sensation and muscle testing.  CT head is negative for hydrocephalus or acute mass, bleed or CVA.  With exam finding and negative diagnostic tests likely not stroke and feel there ia a underlying psychogenic component.   Recommend: Given history of BRCa will obtain CT head with contrast tomorrow to confirm both no mass and evolution of possible stroke  Assessment and plan discussed with with attending physician and they are in agreement.    Felicie Morn PA-C [facility] [phone]  11/06/2012, 1:38 PM  Patient seen and examined together with physician assistant and I concur with the assessment and plan.  Dorian Pod, MD

## 2012-11-06 NOTE — Progress Notes (Signed)
Utilization review completed.  P.J. Jennie Hannay,RN,BSN Case Manager [phone]  

## 2012-11-06 NOTE — Evaluation (Signed)
Speech Language Pathology Evaluation Patient Details Name: Jariyah Hackley MRN: [id number] DOB: 03-30-1956 Today's Date: 11/06/2012 Time: 1308-6578 SLP Time Calculation (min): 20 min  Problem List:  Patient Active Problem List  Diagnosis  . Weakness of left side of body  . HTN (hypertension)  . H/O cerebral aneurysm repair  . Obesity  . Asthma   Past Medical History:  Past Medical History  Diagnosis Date  . Hypertension   . Breast cancer   . Hydrocephalus   . Asthma    Past Surgical History:  Past Surgical History  Procedure Laterality Date  . Brain surgery      stent in head   HPI:  57 YO female with progressive left sided weakness and decreased sensation.  Exam findings not consistent with stroke and show inconsistencies in both sensation and muscle testing.  CT head is negative for hydrocephalus or acute mass, bleed or CVA.  Concern for psychogenic component.     Assessment / Plan / Recommendation Clinical Impression  Pt demonstrates adequate cognitive linguistic function. She reports episodes today of slurred speech but is WFL at time of assessment. Pt verbalizing complex language with some hesitations and repetitions, but no evidence of aphasia or neurogenic deficit. Pt verbalizing anxiety regarding health, and also sadness over loss of family members. Pt may benefit from psych consult/counseling. No SLP f/u needed at this time. Will sign off.     SLP Assessment  Patient does not need any further Speech Lanaguage Pathology Services    Follow Up Recommendations       Frequency and Duration        Pertinent Vitals/Pain NA   SLP Goals     SLP Evaluation Prior Functioning  Cognitive/Linguistic Baseline: Within functional limits Type of Home: House Lives With: Family;Other (Comment) Available Help at Discharge: Family;Available 24 hours/day Vocation: On disability   Cognition  Overall Cognitive Status: Appears within functional limits for tasks  assessed Arousal/Alertness: Awake/alert Orientation Level: Oriented X4 Attention: Alternating Alternating Attention: Appears intact Memory: Appears intact Awareness: Appears intact Problem Solving: Appears intact Executive Function: Reasoning Reasoning: Appears intact Safety/Judgment: Appears intact    Comprehension  Auditory Comprehension Overall Auditory Comprehension: Appears within functional limits for tasks assessed    Expression Verbal Expression Overall Verbal Expression: Appears within functional limits for tasks assessed Written Expression Dominant Hand: Right   Oral / Motor Oral Motor/Sensory Function Overall Oral Motor/Sensory Function: Appears within functional limits for tasks assessed Motor Speech Overall Motor Speech: Appears within functional limits for tasks assessed   GO Functional Assessment Tool Used: clinical judgement Functional Limitations: Attention Attention Current Status (I6962): 0 percent impaired, limited or restricted Attention Goal Status (X5284): 0 percent impaired, limited or restricted Attention Discharge Status (X3244): 0 percent impaired, limited or restricted   Sherrina Zaugg, [name] 11/06/2012, 4:13 PM

## 2012-11-06 NOTE — Progress Notes (Signed)
Rehab Admissions Coordinator Note:  Patient was screened by Meryl Dare for appropriateness for an Inpatient Acute Rehab Consult.  At this time, we are recommending Inpatient Rehab consult.  Meryl Dare 11/06/2012, 3:13 PM  I can be reached at [phone].

## 2012-11-06 NOTE — Progress Notes (Signed)
Text paged MD to inform that MRI dept are unable to do MRI at this point because of unknown vp shunt. MRI personnel put in a request for medical records as [facility] for more information.

## 2012-11-06 NOTE — Evaluation (Signed)
Physical Therapy Evaluation Patient Details Name: Desiree Mitchell MRN: [id number] DOB: [birth year] Today's Date: 11/06/2012 Time: 4696-2952 PT Time Calculation (min): 32 min  PT Assessment / Plan / Recommendation Clinical Impression  Pt. was admitted after she developed left sided weakness, numbness and chest discomfort several days earlier.  she has history of hydrocephalus with shunt placement, cerebral aneurysm, breast cancer, asthma , HTN and obesity.  CT of head was negative for acute findings.  MRI results pending.  She presents to PT with weakness in left UE and left LE of non dominant side.  She also has trouble with conveying her ideas clearly on first try, but unsure this is related.  She will benefit from acute PT to address these and below areas of impairment.    PT Assessment  Patient needs continued PT services    Follow Up Recommendations  CIR    Does the patient have the potential to tolerate intense rehabilitation      Barriers to Discharge Decreased caregiver support;Other (comment) (pt. lives with sister who recently received a lung CA dx.)      Equipment Recommendations  Other (comment) (TBD)    Recommendations for Other Services Rehab consult   Frequency Min 4X/week    Precautions / Restrictions Precautions Precautions: Fall Restrictions Weight Bearing Restrictions: No   Pertinent Vitals/Pain No distress, no pain      Mobility  Bed Mobility Bed Mobility: Not assessed Transfers Transfers: Sit to Stand;Stand to Sit Sit to Stand: 4: Min assist;From chair/3-in-1 Stand to Sit: 4: Min assist;To chair/3-in-1 Details for Transfer Assistance: Pt. needs min assist for safety and stability Ambulation/Gait Ambulation/Gait Assistance: 4: Min assist Ambulation Distance (Feet): 75 Feet Assistive device: None Ambulation/Gait Assistance Details: Pt. with difficulty in left swing phase (slow and with increased effort) but able to manage herself. Gait Pattern:  Step-through pattern;Decreased stance time - left;Decreased step length - left;Decreased hip/knee flexion - left;Decreased dorsiflexion - left;Decreased weight shift to left Gait velocity: slow Stairs: No Modified Rankin (Stroke Patients Only) Pre-Morbid Rankin Score: No significant disability Modified Rankin: Moderate disability    Exercises     PT Diagnosis: Difficulty walking;Abnormality of gait;Hemiplegia non-dominant side  PT Problem List: Decreased strength;Decreased activity tolerance;Decreased balance;Decreased mobility;Decreased coordination;Decreased knowledge of use of DME;Impaired sensation;Obesity PT Treatment Interventions: DME instruction;Gait training;Stair training;Functional mobility training;Therapeutic activities;Balance training;Patient/family education   PT Goals Acute Rehab PT Goals PT Goal Formulation: With patient Time For Goal Achievement: 11/13/12 Potential to Achieve Goals: Good Pt will go Supine/Side to Sit: with modified independence;with HOB 0 degrees PT Goal: Supine/Side to Sit - Progress: Goal set today Pt will go Sit to Supine/Side: with modified independence;with HOB 0 degrees PT Goal: Sit to Supine/Side - Progress: Goal set today Pt will go Sit to Stand: with modified independence PT Goal: Sit to Stand - Progress: Goal set today Pt will go Stand to Sit: with modified independence PT Goal: Stand to Sit - Progress: Goal set today Pt will Transfer Bed to Chair/Chair to Bed: with modified independence PT Transfer Goal: Bed to Chair/Chair to Bed - Progress: Goal set today Pt will Ambulate: >150 feet;with least restrictive assistive device;with modified independence PT Goal: Ambulate - Progress: Goal set today  Visit Information  Last PT Received On: 11/06/12 Assistance Needed: +1    Subjective Data  Subjective: Pt. reports she lives with her sister.  "I do all the driving". Patient Stated Goal: to get better and return home with sister   Prior  Functioning  Home Living  Lives With: Family;Other (Comment) (sister) Available Help at Discharge: Family;Available 24 hours/day Type of Home: House Home Access: Stairs to enter [employer] of Steps: 1 Entrance Stairs-Rails: None Home Layout: One level Bathroom Shower/Tub: Forensic scientist: Standard Bathroom Accessibility: Yes How Accessible: Accessible via walker Home Adaptive Equipment: Bedside commode/3-in-1;Walker - rolling Prior Function Level of Independence: Independent Able to Take Stairs?: Yes Driving: Yes Vocation: On disability Communication Communication: No difficulties Dominant Hand: Right    Cognition  Cognition Overall Cognitive Status: Appears within functional limits for tasks assessed/performed Arousal/Alertness: Awake/alert Orientation Level: Oriented X4 / Intact Behavior During Session: WFL for tasks performed Cognition - Other Comments: appears somewhat stressed and " on edge"    Extremity/Trunk Assessment Right Upper Extremity Assessment RUE ROM/Strength/Tone: WFL for tasks assessed RUE Sensation: WFL - Light Touch RUE Coordination: WFL - gross/fine motor Left Upper Extremity Assessment LUE ROM/Strength/Tone: Deficits LUE ROM/Strength/Tone Deficits: strength grossly tested at 3-/5  LUE Sensation: Deficits LUE Sensation Deficits: pt. describes "numb" feeling even though she detects light touch LUE Coordination: Deficits LUE Coordination Deficits: slowed and clumsy Right Lower Extremity Assessment RLE ROM/Strength/Tone: Within functional levels RLE Sensation: WFL - Light Touch RLE Coordination: WFL - gross/fine motor Left Lower Extremity Assessment LLE ROM/Strength/Tone: Deficits LLE ROM/Strength/Tone Deficits: strength grossly 3/5 range LLE Sensation: Deficits LLE Sensation Deficits: pt. reports feeling of numbness , able to detect light touch LLE Coordination: Deficits LLE Coordination Deficits: difficulty  with RAMs and heel to shin Trunk Assessment Trunk Assessment: Normal   Balance Balance Balance Assessed: Yes Static Sitting Balance Static Sitting - Balance Support: No upper extremity supported;Feet supported Static Sitting - Level of Assistance: 6: Modified independent (Device/Increase time) Static Standing Balance Static Standing - Balance Support: No upper extremity supported;During functional activity Static Standing - Level of Assistance: 5: Stand by assistance  End of Session PT - End of Session Equipment Utilized During Treatment: Gait belt Activity Tolerance: Patient tolerated treatment well Patient left: in chair;with call bell/phone within reach Nurse Communication: Mobility status  GP Functional Assessment Tool Used: clinical judgement Functional Limitation: Mobility: Walking and moving around Mobility: Walking and Moving Around Current Status (W0981): At least 20 percent but less than 40 percent impaired, limited or restricted Mobility: Walking and Moving Around Discharge Status [phone]): At least 1 percent but less than 20 percent impaired, limited or restricted   Ferman Hamming 11/06/2012, 2:59 PM Weldon Picking PT Acute Rehab Services [phone] Beeper [phone]

## 2012-11-06 NOTE — Evaluation (Signed)
Occupational Therapy Evaluation Patient Details Name: Desiree Mitchell MRN: [id number] DOB: 15-Sep-1955 Today's Date: 11/06/2012 Time: 1478-2956 OT Time Calculation (min): 26 min  OT Assessment / Plan / Recommendation Clinical Impression  Pt. was admitted after she developed left sided weakness, numbness and chest discomfort several days earlier.  she has history of hydrocephalus with shunt placement, cerebral aneurysm, breast cancer, asthma , HTN and obesity.  CT of head was negative for acute findings.  MRI results pending.  Will benefit from continued OT services to address below problem list.  Recommending CIR to further progress rehab before return home.    OT Assessment  Patient needs continued OT Services    Follow Up Recommendations  CIR    Barriers to Discharge      Equipment Recommendations  Tub/shower seat    Recommendations for Other Services Rehab consult  Frequency  Min 3X/week    Precautions / Restrictions Precautions Precautions: Fall Restrictions Weight Bearing Restrictions: No   Pertinent Vitals/Pain See vitals    ADL  Eating/Feeding: Performed;Modified independent (using RUE) Where Assessed - Eating/Feeding: Chair Grooming: Performed;Brushing hair;Min guard (using RUE) Where Assessed - Grooming: Supported standing Upper Body Bathing: Simulated;Minimal assistance Where Assessed - Upper Body Bathing: Unsupported sitting Lower Body Bathing: Simulated;Minimal assistance Where Assessed - Lower Body Bathing: Supported sit to stand Upper Body Dressing: Simulated;Minimal assistance Where Assessed - Upper Body Dressing: Unsupported sitting Lower Body Dressing: Performed;Minimal assistance (don/doff socks) Where Assessed - Lower Body Dressing: Supported sit to Pharmacist, hospital: Mining engineer Method: Sit to Barista:  (chair) Equipment Used: Gait belt Transfers/Ambulation Related to ADLs: min assist for  balance ADL Comments: Pt with decreased independence due to L UE/LE weakness. Educated pt on performing LUE AROM and AAROM (using right hand to hold left wrist).  Pt attempting to use iPAD on OT arrival.  Encouraged pt to try to use Left hand when navigating/playing games on iPad.      OT Diagnosis: Generalized weakness;Paresis  OT Problem List: Decreased strength;Decreased range of motion;Impaired balance (sitting and/or standing);Decreased knowledge of use of DME or AE;Obesity;Impaired UE functional use;Impaired sensation OT Treatment Interventions: Self-care/ADL training;Therapeutic exercise;DME and/or AE instruction;Therapeutic activities;Patient/family education;Balance training   OT Goals Acute Rehab OT Goals OT Goal Formulation: With patient Time For Goal Achievement: 11/13/12 Potential to Achieve Goals: Good ADL Goals Pt Will Perform Grooming: with supervision;Standing at sink ADL Goal: Grooming - Progress: Goal set today Pt Will Perform Upper Body Bathing: with set-up;Sitting, chair;Sitting, edge of bed ADL Goal: Upper Body Bathing - Progress: Goal set today Pt Will Perform Upper Body Dressing: with set-up;Sitting, chair;Sitting, bed ADL Goal: Upper Body Dressing - Progress: Goal set today Pt Will Transfer to Toilet: with supervision;Ambulation;Comfort height toilet ADL Goal: Toilet Transfer - Progress: Goal set today Miscellaneous OT Goals Miscellaneous OT Goal #1: Pt will incorporate bil UE tasks into ADLs 75% of time to increase functional use of LUE. OT Goal: Miscellaneous Goal #1 - Progress: Goal set today Miscellaneous OT Goal #2: Pt will independently perform LUE AROM/AAROM 2-3x daily. OT Goal: Miscellaneous Goal #2 - Progress: Goal set today  Visit Information  Last OT Received On: 11/06/12 Assistance Needed: +1    Subjective Data      Prior Functioning     Home Living Lives With: Family;Other (Comment) Available Help at Discharge: Family;Available 24  hours/day Type of Home: House Home Access: Stairs to enter [employer] of Steps: 1 Entrance Stairs-Rails: None Home Layout: One level Bathroom Shower/Tub:  Tub/shower unit;Curtain Teacher, early years/pre: Yes How Accessible: Accessible via walker Home Adaptive Equipment: Bedside commode/3-in-1;Walker - rolling Prior Function Level of Independence: Independent Able to Take Stairs?: Yes Driving: Yes Vocation: On disability Communication Communication: No difficulties Dominant Hand: Right         Vision/Perception Vision - History Baseline Vision: No visual deficits Patient Visual Report: No change from baseline   Cognition  Cognition Overall Cognitive Status: Appears within functional limits for tasks assessed/performed Arousal/Alertness: Awake/alert Orientation Level: Oriented X4 / Intact Behavior During Session: WFL for tasks performed Cognition - Other Comments: appears somewhat stressed and " on edge"    Extremity/Trunk Assessment Right Upper Extremity Assessment RUE ROM/Strength/Tone: WFL for tasks assessed RUE Sensation: WFL - Light Touch;WFL - Proprioception RUE Coordination: WFL - gross/fine motor Left Upper Extremity Assessment LUE ROM/Strength/Tone: Deficits LUE ROM/Strength/Tone Deficits: strength 3/5 in hand, 2+/5 in elbow and shoulder.   LUE Sensation: Deficits LUE Sensation Deficits: "feels heavy" but able to detect light touch. LUE Coordination: Deficits LUE Coordination Deficits: incr time and effort Right Lower Extremity Assessment RLE ROM/Strength/Tone: Within functional levels RLE Sensation: WFL - Light Touch RLE Coordination: WFL - gross/fine motor Left Lower Extremity Assessment LLE ROM/Strength/Tone: Deficits LLE ROM/Strength/Tone Deficits: strength grossly 3/5 range LLE Sensation: Deficits LLE Sensation Deficits: pt. reports feeling of numbness , able to detect light touch LLE Coordination: Deficits LLE  Coordination Deficits: difficulty with RAMs and heel to shin Trunk Assessment Trunk Assessment: Normal     Mobility Bed Mobility Bed Mobility: Not assessed Transfers Transfers: Sit to Stand;Stand to Sit Sit to Stand: 4: Min assist;From chair/3-in-1;With armrests;With upper extremity assist Stand to Sit: 4: Min assist;To chair/3-in-1;With armrests;With upper extremity assist Details for Transfer Assistance: min assist for steadying and balance     Exercise     Balance Balance Balance Assessed: Yes Static Sitting Balance Static Sitting - Balance Support: No upper extremity supported;Feet supported Static Sitting - Level of Assistance: 6: Modified independent (Device/Increase time) Static Standing Balance Static Standing - Balance Support: No upper extremity supported;During functional activity Static Standing - Level of Assistance: 5: Stand by assistance;4: Min assist (close guarding for safety)   End of Session OT - End of Session Equipment Utilized During Treatment: Gait belt Activity Tolerance: Patient tolerated treatment well Patient left: in chair;with call bell/phone within reach;with nursing in room Nurse Communication: Mobility status  GO   11/06/2012 Cipriano Mile OTR/L Pager [phone] Office [phone]'  Cipriano Mile 11/06/2012, 4:47 PM

## 2012-11-06 NOTE — Progress Notes (Signed)
*  PRELIMINARY RESULTS* Vascular Ultrasound Carotid Duplex (Doppler) has been completed.  There is no obvious evidence of hemodynamically significant carotid artery stenosis >40%. Vertebral arteries are patent with antegrade flow.  Incidental finding: There is a 2.3cm round, heterogenous lesion with cystic components in the anteromedial right neck. There is homogenous thyroid tissue lateral to this lesion.  *Preliminary Results* Bilateral lower extremity venous duplex completed. Bilateral lower extremities are negative for deep vein thrombosis. No evidence of Baker's cyst bilaterally.  Preliminary results discussed with Dr.Gherghe.  11/06/2012 11:57 AM Gertie Fey, RDMS, RDCS

## 2012-11-06 NOTE — Progress Notes (Signed)
[facility] PROGRESS NOTE  Kathya Wilz ZOX:[id number] DOB: 1956/02/15 DOA: 11/05/2012 PCP: No primary provider on file.  Brief Narrative: Desiree Mitchell is a 57 y.o. female PMH of breast cancer (s/p bilat mastectomy and chemotherapy; currently in remision and no receiving any treatment), HTN, and hydrocephalus (with VP shunt placement); came to ED after experiencing sudden onset of left side weakness and numbness. Patient reports symptoms first started Tuesday night (only her left leg was feeling heavy); then on Wednesday symptoms and heavy left side sensation worsen and start affecting her arm; patient decline seeking medical attention at that moment. On day of admission 11/05/12 patient unable move her left side, unable to walk w/o stumbling and also per family members started having difficulty speaking (some slurred speech and articulation difficulties). No code stroke activated due to delay in presentation. CT scan neg in ED  Assessment/Plan: Weakness of left side of body: sudden and still present on exam. Patient reports symptoms started about 24-30 hours prior to admission. MRI/MRA under investigation by radiology whether it is safe.  -ASA for secondary prevention  -neurology consulted will follow recommendations  -PT/OT/SLP  -will check A1C, lipid panel, TSH and B12   HTN (hypertension): continue Toprol. Will be permissive with mild HTN in setting of acute CVA   H/O cerebral aneurysm repair: no coils seen on CT head. Patient VP shunt.   LE discomfort and mild swelling: L > R; will get LE dopplers.  Code Status: Presumed Full Family Communication: none  Disposition Plan: pending CVA evaluation  Consultants:  Neurology  Procedures:  none  Antibiotics:  none  HPI/Subjective: - persistent left sided weakness this morning  Objective: Filed Vitals:   11/06/12 0000 11/06/12 0200 11/06/12 0400 11/06/12 0600  BP: 148/80 168/88 167/83 175/89  Pulse: 84 65 76 70  Temp:  97.6 F (36.4 C) 97.6 F (36.4 C) 97.3 F (36.3 C) 97.7 F (36.5 C)  TempSrc:      Resp: 18 18 18 18   SpO2: 97% 98% 99% 98%    Intake/Output Summary (Last 24 hours) at 11/06/12 0804 Last data filed at 11/06/12 0743  Gross per 24 hour  Intake    360 ml  Output      0 ml  Net    360 ml   There were no vitals filed for this visit.  Exam:   General:  NAD  Cardiovascular: regular rate and rhythm, without MRG  Respiratory: good air movement, clear to auscultation throughout, no wheezing, ronchi or rales  Abdomen: soft, not tender to palpation, positive bowel sounds  MSK: no peripheral edema  Neuro: CN 2-12 grossly intact, left sided weakness present  Data Reviewed: Basic Metabolic Panel:  Recent Labs Lab 11/05/12 1328 11/05/12 1931  NA 144  --   K 3.9  --   CL 108  --   CO2 25  --   GLUCOSE 95  --   BUN 11  --   CREATININE 0.92 0.95  CALCIUM 9.1  --    Liver Function Tests: No results found for this basename: AST, ALT, ALKPHOS, BILITOT, PROT, ALBUMIN,  in the last 168 hours No results found for this basename: LIPASE, AMYLASE,  in the last 168 hours No results found for this basename: AMMONIA,  in the last 168 hours CBC:  Recent Labs Lab 11/05/12 1328 11/05/12 1931  WBC 8.6 8.7  NEUTROABS 6.3  --   HGB 14.0 12.2  HCT 41.7 36.3  MCV 94.6 94.0  PLT 315 342  Cardiac Enzymes:  Recent Labs Lab 11/05/12 1905 11/06/12 0039 11/06/12 0640  TROPONINI <0.30 <0.30 <0.30   BNP (last 3 results) No results found for this basename: PROBNP,  in the last 8760 hours CBG: No results found for this basename: GLUCAP,  in the last 168 hours  No results found for this or any previous visit (from the past 240 hour(s)).   Studies: Dg Chest 2 View  11/05/2012  *RADIOLOGY REPORT*  Clinical Data: CVA  CHEST - 2 VIEW  Comparison: None.  Findings: Right Port-A-Cath tip:  SVC. VP shunt tubing noted.  The patient is rotated to the right on today's exam, resulting in  reduced diagnostic sensitivity and specificity.   Cardiomegaly noted without edema.  No pleural effusion identified.  IMPRESSION:  1.  Cardiomegaly without edema. 2.  Right Port-A-Cath tip:  SVC.   Original Report Authenticated By: Gaylyn Rong, M.D.    Ct Head Wo Contrast  11/05/2012  *RADIOLOGY REPORT*  Clinical Data: Left side numbness.  CT HEAD WITHOUT CONTRAST  Technique:  Contiguous axial images were obtained from the base of the skull through the vertex without contrast.  Comparison: None.  Findings: The patient has a left parietal approach ventriculostomy shunt catheter.  The catheter just crosses the midline.  The ventricles are decompressed.  Hypoattenuation in the subcortical and periventricular deep white matter is identified.  There is no evidence of acute abnormality including infarct, hemorrhage, mass lesion, mass effect, midline shift or abnormal extra-axial fluid collection.  No hydrocephalus or pneumocephalus.  Calvarium intact.  IMPRESSION: No acute finding with a ventriculostomy shunt catheter in place. Chronic microvascular ischemic change noted.   Original Report Authenticated By: Holley Dexter, M.D.     Scheduled Meds: . aspirin  300 mg Rectal Daily   Or  . aspirin  325 mg Oral Daily  . enoxaparin (LOVENOX) injection  40 mg Subcutaneous Q24H  . metoprolol succinate  50 mg Oral q morning - 10a  . pantoprazole  40 mg Oral Q1200   Continuous Infusions:   Principal Problem:   Weakness of left side of body Active Problems:   HTN (hypertension)   H/O cerebral aneurysm repair   Obesity   Asthma  Pamella Pert, MD [facility] Pager [phone]. If 7 PM - 7 AM, please contact night-coverage at [email or web address], password [facility] 11/06/2012, 8:04 AM  LOS: 1 day

## 2012-11-07 ENCOUNTER — Inpatient Hospital Stay (HOSPITAL_COMMUNITY): Payer: Medicaid Other

## 2012-11-07 LAB — URINE CULTURE

## 2012-11-07 MED ORDER — TRAMADOL HCL 50 MG PO TABS
50.0000 mg | ORAL_TABLET | Freq: Four times a day (QID) | ORAL | Status: DC | PRN
  Administered 2012-11-07 – 2012-11-09 (×3): 50 mg via ORAL
  Filled 2012-11-07 (×3): qty 1

## 2012-11-07 NOTE — Progress Notes (Signed)
NEURO HOSPITALIST PROGRESS NOTE   SUBJECTIVE:                                                                                                                        Complains of pain both legs since last night. Otherwise, denies headache, vertigo, double vision, difficulty swallowing, slurred speech, language or vision impairment. CT brain showed a non specific 8 mm hypodensity in the right upper pons, visible only on a single image.   OBJECTIVE:                                                                                                                           Vital signs in last 24 hours: Temp:  [97.8 F (36.6 C)-98.6 F (37 C)] 97.8 F (36.6 C) (03/29 0600) Pulse Rate:  [57-77] 64 (03/29 0600) Resp:  [18] 18 (03/29 0600) BP: (148-173)/(80-92) 173/92 mmHg (03/29 0600) SpO2:  [95 %-99 %] 98 % (03/29 0600)  Intake/Output from previous day: 03/28 0701 - 03/29 0700 In: 1080 [P.O.:1080] Out: 300 [Urine:300] Intake/Output this shift: Total I/O In: 420 [P.O.:420] Out: -  Nutritional status: Cardiac  Past Medical History  Diagnosis Date  . Hypertension   . Breast cancer   . Hydrocephalus   . Asthma     Neurologic ROS negative with exception of above.   Neurologic Exam:  Mental status is intact. Speech and language are not impaired. Some mild weakness left lower face. There is left sided weakness but I am unsure she is given me full effort. No other abnormalities on neuro-exam.  Lab Results: Lab Results  Component Value Date/Time   CHOL 180 11/06/2012  6:40 AM   Lipid Panel  Recent Labs  11/06/12 0640  CHOL 180  TRIG 162*  HDL 39*  CHOLHDL 4.6  VLDL 32  LDLCALC 956*    Studies/Results: Dg Chest 2 View  11/05/2012  *RADIOLOGY REPORT*  Clinical Data: CVA  CHEST - 2 VIEW  Comparison: None.  Findings: Right Port-A-Cath tip:  SVC. VP shunt tubing noted.  The patient is rotated to the right on today's exam, resulting  in reduced diagnostic sensitivity and specificity.   Cardiomegaly noted without edema.  No pleural effusion identified.  IMPRESSION:  1.  Cardiomegaly without edema. 2.  Right Port-A-Cath tip:  SVC.   Original Report Authenticated By: Gaylyn Rong, M.D.    Ct Head Wo Contrast  11/07/2012  *RADIOLOGY REPORT*  Clinical Data: Weakness and dizziness.  Breast cancer. Hypertension.  CT HEAD WITHOUT CONTRAST  Technique:  Contiguous axial images were obtained from the base of the skull through the vertex without contrast.  Comparison: 11/05/2012  Findings: Questionable small chronic linear lacunar infarct in the left cerebellum, image 6 of series 2.  Nonspecific 8 mm hypodensity in the right upper pons, image 8 of series 2.  Thalami intact. Basal ganglia intact.  The left parietal and colostomy catheter is present extending to the left lateral ventricle and terminating in the vicinity of the corpus callosum.  Patchy white matter hypodensities favor chronic ischemic microvascular white matter disease.  No intracranial hemorrhages observed.  No hydrocephalus.  IMPRESSION:  1.  8 mm hypodense lesion in the right upper pons.  This is nonspecific and only visible on this single image - although quite likely a chronic lacunar infarct, I cannot exclude a subacute lesion or small mass. 2.  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 3.  Patchy white matter hypodensities favor chronic ischemic microvascular white matter disease. 4.  Left parietal ventriculostomy catheter positioning stable, tip adjacent to the corpus callosum.  No hydrocephalus.   Original Report Authenticated By: Gaylyn Rong, M.D.    Ct Head Wo Contrast  11/05/2012  *RADIOLOGY REPORT*  Clinical Data: Left side numbness.  CT HEAD WITHOUT CONTRAST  Technique:  Contiguous axial images were obtained from the base of the skull through the vertex without contrast.  Comparison: None.  Findings: The patient has a left parietal approach  ventriculostomy shunt catheter.  The catheter just crosses the midline.  The ventricles are decompressed.  Hypoattenuation in the subcortical and periventricular deep white matter is identified.  There is no evidence of acute abnormality including infarct, hemorrhage, mass lesion, mass effect, midline shift or abnormal extra-axial fluid collection.  No hydrocephalus or pneumocephalus.  Calvarium intact.  IMPRESSION: No acute finding with a ventriculostomy shunt catheter in place. Chronic microvascular ischemic change noted.   Original Report Authenticated By: Holley Dexter, M.D.     MEDICATIONS                                                                                                                       I have reviewed the patient's current medications.  ASSESSMENT/PLAN:  57 years old female with history of breast cancer on remission ( s/p bilateral mastectomy and chemotherapy), HTN, surgical repair brain aneurysm and VP shunt, admitted with new onset left sided weakness. Follow up CT brain was done without contrast lst night, and there is a small hypodense area in the right pons which could represent a subacute-chronic infarct and less likely a mass. It is unclear whether or not her shunt is MRI compatible. Unfortunately, she will need to have CT brain with contrast if MRI is not feasible. Will continue to follow.  Wyatt Portela, MD [facility] [phone]  11/07/2012, 9:11 AM

## 2012-11-07 NOTE — Progress Notes (Signed)
Per pt she has a shunt that has been placed for 10 years. She was told to not have a MRI after having this placed. To my knowledge there are no shunts that CAN NOT be scanned but there are shunts that are conditional for MRI. These shunts are not damaged by the MRI but do have to be re calibrated by a representative from the vendor of the shunt's manufactor; or the Neurosurgeon who manages this shunt with in 48-72 hours. Many attempts were made with [facility] yesterday ; informed/signed by pt consent faxed many times and many calls made with no response from hosp. I have also called and via the RN Darral Dash asked if the pt could get her purse back to the hosp as she says she does have a identifying card for this shunt in her purse. She sent her purse home with a friend when she arrived at the hospital. This also doesn't sound to be an option per the pt. If we can receive identifying information on this patient we will be happy to continue clearing this patient for MRI. Call with questions or concerns ext [phone] -Eustaquio Boyden, RT,R,MR

## 2012-11-07 NOTE — Progress Notes (Signed)
[facility] PROGRESS NOTE  Desiree Mitchell FAO:[id number] DOB: 1956-01-24 DOA: 11/05/2012 PCP: No primary provider on file.  Brief Narrative: Desiree Mitchell is a 57 y.o. female PMH of breast cancer (s/p bilat mastectomy and chemotherapy; currently in remision and no receiving any treatment), HTN, and hydrocephalus (with VP shunt placement); came to ED after experiencing sudden onset of left side weakness and numbness. Patient reports symptoms first started Tuesday night (only her left leg was feeling heavy); then on Wednesday symptoms and heavy left side sensation worsen and start affecting her arm; patient decline seeking medical attention at that moment. On day of admission 11/05/12 patient unable move her left side, unable to walk w/o stumbling and also per family members started having difficulty speaking (some slurred speech and articulation difficulties). No code stroke activated due to delay in presentation. CT scan neg in ED  Assessment/Plan: Weakness of left side of body: sudden and still present on exam. Patient reports symptoms started about 24-30 hours prior to admission. -ASA for secondary prevention  -neurology consulted will follow recommendations  -PT/OT/SLP  - CT with contrast pending  HTN (hypertension): continue Toprol.  H/O cerebral aneurysm repair: no coils seen on CT head. Patent VP shunt.   LE discomfort and mild swelling: L > R;  LE dopplers negative for DVT  Code Status: Presumed Full Family Communication: none  Disposition Plan: pending CVA evaluation  Consultants:  Neurology  Procedures:  none  Antibiotics:  none  HPI/Subjective: - doing well this morning, no new weakness, persistent left sided weakness.   Objective: Filed Vitals:   11/06/12 1506 11/06/12 1742 11/06/12 2200 11/07/12 0600  BP: 148/85 152/82 148/80 173/92  Pulse: 77 65 57 64  Temp: 98 F (36.7 C) 98.2 F (36.8 C) 98.1 F (36.7 C) 97.8 F (36.6 C)  TempSrc: Oral Oral     Resp: 18 18 18 18   SpO2: 97% 95% 99% 98%    Intake/Output Summary (Last 24 hours) at 11/07/12 0739 Last data filed at 11/06/12 1700  Gross per 24 hour  Intake   1080 ml  Output    300 ml  Net    780 ml   Exam:   General:  NAD  Cardiovascular: regular rate and rhythm, without MRG  Respiratory: good air movement, clear to auscultation throughout, no wheezing, ronchi or rales  Abdomen: soft, not tender to palpation, positive bowel sounds  MSK: no peripheral edema  Neuro: CN 2-12 grossly intact, left sided weakness present  Data Reviewed: Basic Metabolic Panel:  Recent Labs Lab 11/05/12 1328 11/05/12 1931  NA 144  --   K 3.9  --   CL 108  --   CO2 25  --   GLUCOSE 95  --   BUN 11  --   CREATININE 0.92 0.95  CALCIUM 9.1  --    CBC:  Recent Labs Lab 11/05/12 1328 11/05/12 1931  WBC 8.6 8.7  NEUTROABS 6.3  --   HGB 14.0 12.2  HCT 41.7 36.3  MCV 94.6 94.0  PLT 315 342   Cardiac Enzymes:  Recent Labs Lab 11/05/12 1905 11/06/12 0039 11/06/12 0640  TROPONINI <0.30 <0.30 <0.30    Recent Results (from the past 240 hour(s))  URINE CULTURE     Status: None   Collection Time    11/06/12 12:46 AM      Result Value Range Status   Specimen Description URINE, CLEAN CATCH   Final   Special Requests NONE   Final   Culture  Setup Time 11/06/2012 05:50   Final   Colony Count 9,000 COLONIES/ML   Final   Culture INSIGNIFICANT GROWTH   Final   Report Status 11/07/2012 FINAL   Final     Studies: Dg Chest 2 View  11/05/2012  *RADIOLOGY REPORT*  Clinical Data: CVA  CHEST - 2 VIEW  Comparison: None.  Findings: Right Port-A-Cath tip:  SVC. VP shunt tubing noted.  The patient is rotated to the right on today's exam, resulting in reduced diagnostic sensitivity and specificity.   Cardiomegaly noted without edema.  No pleural effusion identified.  IMPRESSION:  1.  Cardiomegaly without edema. 2.  Right Port-A-Cath tip:  SVC.   Original Report Authenticated By: Gaylyn Rong, M.D.    Ct Head Wo Contrast  11/05/2012  *RADIOLOGY REPORT*  Clinical Data: Left side numbness.  CT HEAD WITHOUT CONTRAST  Technique:  Contiguous axial images were obtained from the base of the skull through the vertex without contrast.  Comparison: None.  Findings: The patient has a left parietal approach ventriculostomy shunt catheter.  The catheter just crosses the midline.  The ventricles are decompressed.  Hypoattenuation in the subcortical and periventricular deep white matter is identified.  There is no evidence of acute abnormality including infarct, hemorrhage, mass lesion, mass effect, midline shift or abnormal extra-axial fluid collection.  No hydrocephalus or pneumocephalus.  Calvarium intact.  IMPRESSION: No acute finding with a ventriculostomy shunt catheter in place. Chronic microvascular ischemic change noted.   Original Report Authenticated By: Holley Dexter, M.D.     Scheduled Meds: . aspirin  300 mg Rectal Daily   Or  . aspirin  325 mg Oral Daily  . enoxaparin (LOVENOX) injection  40 mg Subcutaneous Q24H  . metoprolol succinate  50 mg Oral q morning - 10a  . pantoprazole  40 mg Oral Q1200  . simvastatin  40 mg Oral q1800   Continuous Infusions:   Principal Problem:   Weakness of left side of body Active Problems:   HTN (hypertension)   H/O cerebral aneurysm repair   Obesity   Asthma  Pamella Pert, MD [facility] Pager [phone]. If 7 PM - 7 AM, please contact night-coverage at [email or web address], password [facility] 11/07/2012, 7:39 AM  LOS: 2 days

## 2012-11-08 ENCOUNTER — Inpatient Hospital Stay (HOSPITAL_COMMUNITY): Payer: Medicaid Other

## 2012-11-08 MED ORDER — IOHEXOL 300 MG/ML  SOLN
80.0000 mL | Freq: Once | INTRAMUSCULAR | Status: AC | PRN
  Administered 2012-11-08: 80 mL via INTRAVENOUS

## 2012-11-08 MED ORDER — INFLUENZA VIRUS VACC SPLIT PF IM SUSP
0.5000 mL | INTRAMUSCULAR | Status: DC
  Filled 2012-11-08: qty 0.5

## 2012-11-08 NOTE — Progress Notes (Signed)
[facility] PROGRESS NOTE  Isamar Nazir NFA:[id number] DOB: 01-Oct-1955 DOA: 11/05/2012 PCP: No primary provider on file.  Brief Narrative: Desiree Mitchell is a 57 y.o. female PMH of breast cancer (s/p bilat mastectomy and chemotherapy; currently in remision and no receiving any treatment), HTN, and hydrocephalus (with VP shunt placement); came to ED after experiencing sudden onset of left side weakness and numbness. Patient reports symptoms first started Tuesday night (only her left leg was feeling heavy); then on Wednesday symptoms and heavy left side sensation worsen and start affecting her arm; patient decline seeking medical attention at that moment. On day of admission 11/05/12 patient unable move her left side, unable to walk w/o stumbling and also per family members started having difficulty speaking (some slurred speech and articulation difficulties). No code stroke activated due to delay in presentation. CT scan neg in ED  Assessment/Plan: Weakness of left side of body: sudden and still present on exam. Patient reports symptoms started about 24-30 hours prior to admission. -ASA for secondary prevention  -neurology consulted will follow recommendations  -PT/OT/SLP  - CT with contrast not impressive. Do not know MRI compatibility with her shunt. Of note, when she had the shunt her name was Deirdre Priest.   HTN (hypertension): continue Toprol.  H/O cerebral aneurysm repair: no coils seen on CT head. Patent VP shunt.   LE discomfort and mild swelling: L > R;  LE dopplers negative for DVT  Code Status: Presumed Full Family Communication: none  Disposition Plan: Rehab 1-2 days.   Consultants:  Neurology  Procedures:  2D echo Study Conclusions  - Left ventricle: The cavity size was normal. Wall thickness was normal. Systolic function was normal. The estimated ejection fraction was in the range of 60% to 65%. Wall motion was normal; there were no regional wall  motion abnormalities. Doppler parameters are consistent with abnormal left ventricular relaxation (grade 1 diastolic dysfunction). - Mitral valve: Trivial regurgitation. - Left atrium: The atrium was at the upper limits of normal in size. - Right atrium: Central venous pressure: 5mm Hg (est). - Atrial septum: No defect or patent foramen ovale was identified. - Tricuspid valve: Trivial regurgitation. - Pulmonary arteries: Systolic pressure could not be accurately estimated. - Pericardium, extracardiac: There was no pericardial effusion.   Antibiotics:  none  HPI/Subjective: - sitting in chair, comfortable  Objective: Filed Vitals:   11/08/12 0114 11/08/12 0605 11/08/12 0941 11/08/12 1429  BP: 146/70 138/77 155/70 156/72  Pulse: 71 65 61 64  Temp: 98.3 F (36.8 C) 97.6 F (36.4 C) 98 F (36.7 C) 98.2 F (36.8 C)  TempSrc: Oral Oral Oral Oral  Resp: 20 18 16 16   SpO2: 97% 99% 98% 99%    Intake/Output Summary (Last 24 hours) at 11/08/12 1512 Last data filed at 11/07/12 1800  Gross per 24 hour  Intake    360 ml  Output    650 ml  Net   -290 ml   Exam:   General:  NAD  Cardiovascular: regular rate and rhythm, without MRG  Respiratory: good air movement, clear to auscultation throughout, no wheezing, ronchi or rales  Abdomen: soft, not tender to palpation, positive bowel sounds  MSK: no peripheral edema  Neuro: CN 2-12 grossly intact, left sided weakness present  Data Reviewed: Basic Metabolic Panel:  Recent Labs Lab 11/05/12 1328 11/05/12 1931  NA 144  --   K 3.9  --   CL 108  --   CO2 25  --   GLUCOSE 95  --  BUN 11  --   CREATININE 0.92 0.95  CALCIUM 9.1  --    CBC:  Recent Labs Lab 11/05/12 1328 11/05/12 1931  WBC 8.6 8.7  NEUTROABS 6.3  --   HGB 14.0 12.2  HCT 41.7 36.3  MCV 94.6 94.0  PLT 315 342   Cardiac Enzymes:  Recent Labs Lab 11/05/12 1905 11/06/12 0039 11/06/12 0640  TROPONINI <0.30 <0.30 <0.30    Recent  Results (from the past 240 hour(s))  URINE CULTURE     Status: None   Collection Time    11/06/12 12:46 AM      Result Value Range Status   Specimen Description URINE, CLEAN CATCH   Final   Special Requests NONE   Final   Culture  Setup Time 11/06/2012 05:50   Final   Colony Count 9,000 COLONIES/ML   Final   Culture INSIGNIFICANT GROWTH   Final   Report Status 11/07/2012 FINAL   Final     Studies: Ct Head Wo Contrast  11/07/2012  *RADIOLOGY REPORT*  Clinical Data: Weakness and dizziness.  Breast cancer. Hypertension.  CT HEAD WITHOUT CONTRAST  Technique:  Contiguous axial images were obtained from the base of the skull through the vertex without contrast.  Comparison: 11/05/2012  Findings: Questionable small chronic linear lacunar infarct in the left cerebellum, image 6 of series 2.  Nonspecific 8 mm hypodensity in the right upper pons, image 8 of series 2.  Thalami intact. Basal ganglia intact.  The left parietal and colostomy catheter is present extending to the left lateral ventricle and terminating in the vicinity of the corpus callosum.  Patchy white matter hypodensities favor chronic ischemic microvascular white matter disease.  No intracranial hemorrhages observed.  No hydrocephalus.  IMPRESSION:  1.  8 mm hypodense lesion in the right upper pons.  This is nonspecific and only visible on this single image - although quite likely a chronic lacunar infarct, I cannot exclude a subacute lesion or small mass. 2.  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 3.  Patchy white matter hypodensities favor chronic ischemic microvascular white matter disease. 4.  Left parietal ventriculostomy catheter positioning stable, tip adjacent to the corpus callosum.  No hydrocephalus.   Original Report Authenticated By: Gaylyn Rong, M.D.    Ct Head W Wo Contrast  11/08/2012  *RADIOLOGY REPORT*  Clinical Data: Weakness and dizziness.  Breast cancer.  Abnormal CT scan.  CT HEAD WITHOUT AND  WITH CONTRAST  Technique:  Contiguous axial images were obtained from the base of the skull through the vertex without and with intravenous contrast.  Contrast: 80mL OMNIPAQUE IOHEXOL 300 MG/ML  SOLN  Comparison: CT head without contrast 11/07/2012.  Findings: Extensive white matter changes are again noted.  The postcontrast images demonstrate no pathologic enhancement. Asymmetric white matter hypoattenuation in the right pons is confirmed.  There is asymmetric white matter disease in the left cerebellum as well.  A left parietal ventriculostomy shunt is in place.  The tip of the shunt is at the anterior genu of the corpus callosum.  The ventricles are normal size.  No significant extra-axial fluid collection is present.  IMPRESSION:  1.  Diffuse white matter disease is stable. 2.  White matter changes in the right pons and left cerebellum are again noted.  These are nonspecific. 3.  Left parietal shunt catheter. 4.  No acute intracranial abnormality or significant interval change.  Of note, most ventriculostomy shunt catheters are safe within the MRI environment, but  the exact make and model is needed to determine if any reprogramming is necessary.   Original Report Authenticated By: Marin Roberts, M.D.     Scheduled Meds: . aspirin  300 mg Rectal Daily   Or  . aspirin  325 mg Oral Daily  . enoxaparin (LOVENOX) injection  40 mg Subcutaneous Q24H  . metoprolol succinate  50 mg Oral q morning - 10a  . pantoprazole  40 mg Oral Q1200  . simvastatin  40 mg Oral q1800   Continuous Infusions:   Principal Problem:   Weakness of left side of body Active Problems:   HTN (hypertension)   H/O cerebral aneurysm repair   Obesity   Asthma  Pamella Pert, MD [facility] Pager [phone]. If 7 PM - 7 AM, please contact night-coverage at [email or web address], password [facility] 11/08/2012, 3:12 PM  LOS: 3 days

## 2012-11-08 NOTE — Progress Notes (Signed)
NEURO HOSPITALIST PROGRESS NOTE   SUBJECTIVE:                                                                                                                        Offers no new neurological complains. CT brain with and without contrast confirms the presence of small, non enhancing, non specific area of low attenuation right pons. Stated that left sided weakness is unchanged.  OBJECTIVE:                                                                                                                           Vital signs in last 24 hours: Temp:  [97.6 F (36.4 C)-98.3 F (36.8 C)] 98 F (36.7 C) (03/30 0941) Pulse Rate:  [55-71] 61 (03/30 0941) Resp:  [16-20] 16 (03/30 0941) BP: (137-155)/(70-80) 155/70 mmHg (03/30 0941) SpO2:  [96 %-99 %] 98 % (03/30 0941)  Intake/Output from previous day: 03/29 0701 - 03/30 0700 In: 780 [P.O.:780] Out: 650 [Urine:650] Intake/Output this shift:   Nutritional status: Cardiac  Past Medical History  Diagnosis Date  . Hypertension   . Breast cancer   . Hydrocephalus   . Asthma     Neurologic ROS negative with exception of above.  Neurologic Exam:  Mental Status: Alert, oriented, thought content appropriate.  Speech fluent without evidence of aphasia.  Able to follow 3 step commands without difficulty. Cranial Nerves: II: Discs flat bilaterally; Visual fields grossly normal, pupils equal, round, reactive to light and accommodation III,IV, VI: ptosis not present, extra-ocular motions intact bilaterally V: facial light touch sensation normal bilaterally. VII: questionable subtle weakness left lower face. VIII: hearing normal bilaterally IX,X: gag reflex present XI: bilateral shoulder shrug XII: midline tongue extension Motor: Significant for weakness left arm and leg, but perhaps no full effort from the patient during testing.  Sensory: Pinprick and light touch intact throughout, bilaterally Deep  Tendon Reflexes: 2+ and symmetric throughout Plantars: Right: downgoing   Left: downgoing Cerebellar: normal finger-to-nose,  normal heel-to-shin test Gait: no tested. CV: pulses palpable throughout    Lab Results: Lab Results  Component Value Date/Time   CHOL 180 11/06/2012  6:40 AM   Lipid Panel  Recent Labs  11/06/12 0640  CHOL 180  TRIG 162*  HDL 39*  CHOLHDL 4.6  VLDL 32  LDLCALC 161*    Studies/Results: Ct Head Wo Contrast  11/07/2012  *RADIOLOGY REPORT*  Clinical Data: Weakness and dizziness.  Breast cancer. Hypertension.  CT HEAD WITHOUT CONTRAST  Technique:  Contiguous axial images were obtained from the base of the skull through the vertex without contrast.  Comparison: 11/05/2012  Findings: Questionable small chronic linear lacunar infarct in the left cerebellum, image 6 of series 2.  Nonspecific 8 mm hypodensity in the right upper pons, image 8 of series 2.  Thalami intact. Basal ganglia intact.  The left parietal and colostomy catheter is present extending to the left lateral ventricle and terminating in the vicinity of the corpus callosum.  Patchy white matter hypodensities favor chronic ischemic microvascular white matter disease.  No intracranial hemorrhages observed.  No hydrocephalus.  IMPRESSION:  1.  8 mm hypodense lesion in the right upper pons.  This is nonspecific and only visible on this single image - although quite likely a chronic lacunar infarct, I cannot exclude a subacute lesion or small mass. 2.  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 3.  Patchy white matter hypodensities favor chronic ischemic microvascular white matter disease. 4.  Left parietal ventriculostomy catheter positioning stable, tip adjacent to the corpus callosum.  No hydrocephalus.   Original Report Authenticated By: Gaylyn Rong, M.D.    Ct Head W Wo Contrast  11/08/2012  *RADIOLOGY REPORT*  Clinical Data: Weakness and dizziness.  Breast cancer.  Abnormal CT scan.   CT HEAD WITHOUT AND WITH CONTRAST  Technique:  Contiguous axial images were obtained from the base of the skull through the vertex without and with intravenous contrast.  Contrast: 80mL OMNIPAQUE IOHEXOL 300 MG/ML  SOLN  Comparison: CT head without contrast 11/07/2012.  Findings: Extensive white matter changes are again noted.  The postcontrast images demonstrate no pathologic enhancement. Asymmetric white matter hypoattenuation in the right pons is confirmed.  There is asymmetric white matter disease in the left cerebellum as well.  A left parietal ventriculostomy shunt is in place.  The tip of the shunt is at the anterior genu of the corpus callosum.  The ventricles are normal size.  No significant extra-axial fluid collection is present.  IMPRESSION:  1.  Diffuse white matter disease is stable. 2.  White matter changes in the right pons and left cerebellum are again noted.  These are nonspecific. 3.  Left parietal shunt catheter. 4.  No acute intracranial abnormality or significant interval change.  Of note, most ventriculostomy shunt catheters are safe within the MRI environment, but the exact make and model is needed to determine if any reprogramming is necessary.   Original Report Authenticated By: Marin Roberts, M.D.     MEDICATIONS                                                                                                                       I have reviewed the patient's current  medications.  ASSESSMENT/PLAN:                                                                                                           Clinically unchanged. CT brain with and without contrast showed small, non enhancing, non specific area of low attenuation right pons. A pontine stroke seems to be unlikely, as patient's findings on neuro-exam seem to be more consistent with a supratentorial than a brainstem syndrome. MRI brain not feasible as per neuroradiology. Continue PT and aspirin. Will sign  off.  Wyatt Portela, MD [facility] [phone]  11/08/2012, 12:46 PM

## 2012-11-09 ENCOUNTER — Inpatient Hospital Stay (HOSPITAL_COMMUNITY)
Admission: RE | Admit: 2012-11-09 | Discharge: 2012-11-14 | DRG: 945 | Disposition: A | Discharge status: Home Health | Payer: Medicaid Other | Source: Intra-hospital | Attending: Physical Medicine & Rehabilitation | Admitting: Physical Medicine & Rehabilitation

## 2012-11-09 DIAGNOSIS — Z9010 Acquired absence of unspecified breast and nipple: Secondary | ICD-10-CM

## 2012-11-09 DIAGNOSIS — G911 Obstructive hydrocephalus: Secondary | ICD-10-CM

## 2012-11-09 DIAGNOSIS — J45909 Unspecified asthma, uncomplicated: Secondary | ICD-10-CM

## 2012-11-09 DIAGNOSIS — Z7982 Long term (current) use of aspirin: Secondary | ICD-10-CM

## 2012-11-09 DIAGNOSIS — Z79899 Other long term (current) drug therapy: Secondary | ICD-10-CM

## 2012-11-09 DIAGNOSIS — E785 Hyperlipidemia, unspecified: Secondary | ICD-10-CM

## 2012-11-09 DIAGNOSIS — I6330 Cerebral infarction due to thrombosis of unspecified cerebral artery: Secondary | ICD-10-CM

## 2012-11-09 DIAGNOSIS — R29898 Other symptoms and signs involving the musculoskeletal system: Secondary | ICD-10-CM

## 2012-11-09 DIAGNOSIS — I10 Essential (primary) hypertension: Secondary | ICD-10-CM

## 2012-11-09 DIAGNOSIS — I639 Cerebral infarction, unspecified: Secondary | ICD-10-CM

## 2012-11-09 DIAGNOSIS — Z982 Presence of cerebrospinal fluid drainage device: Secondary | ICD-10-CM

## 2012-11-09 DIAGNOSIS — R29810 Facial weakness: Secondary | ICD-10-CM

## 2012-11-09 DIAGNOSIS — Z9221 Personal history of antineoplastic chemotherapy: Secondary | ICD-10-CM

## 2012-11-09 DIAGNOSIS — Z853 Personal history of malignant neoplasm of breast: Secondary | ICD-10-CM

## 2012-11-09 DIAGNOSIS — Z5189 Encounter for other specified aftercare: Principal | ICD-10-CM

## 2012-11-09 DIAGNOSIS — R471 Dysarthria and anarthria: Secondary | ICD-10-CM

## 2012-11-09 DIAGNOSIS — K219 Gastro-esophageal reflux disease without esophagitis: Secondary | ICD-10-CM

## 2012-11-09 LAB — CBC
Hemoglobin: 12.3 g/dL (ref 12.0–15.0)
MCV: 94.8 fL (ref 78.0–100.0)
Platelets: 334 10*3/uL (ref 150–400)
RBC: 3.88 MIL/uL (ref 3.87–5.11)
WBC: 9.3 10*3/uL (ref 4.0–10.5)

## 2012-11-09 LAB — CREATININE, SERUM: GFR calc Af Amer: 73 mL/min — ABNORMAL LOW (ref >90)

## 2012-11-09 MED ORDER — ENOXAPARIN SODIUM 40 MG/0.4ML ~~LOC~~ SOLN
40.0000 mg | SUBCUTANEOUS | Status: DC
  Administered 2012-11-09 – 2012-11-13 (×5): 40 mg via SUBCUTANEOUS
  Filled 2012-11-09 (×6): qty 0.4

## 2012-11-09 MED ORDER — ASPIRIN 325 MG PO TABS: 325.0000 mg | ORAL_TABLET | Freq: Every day | ORAL | Status: DC

## 2012-11-09 MED ORDER — PANTOPRAZOLE SODIUM 40 MG PO TBEC
40.0000 mg | DELAYED_RELEASE_TABLET | Freq: Every day | ORAL | Status: DC
  Administered 2012-11-10 – 2012-11-14 (×5): 40 mg via ORAL
  Filled 2012-11-09 (×4): qty 1

## 2012-11-09 MED ORDER — METOPROLOL SUCCINATE ER 50 MG PO TB24
50.0000 mg | ORAL_TABLET | Freq: Every day | ORAL | Status: DC
  Administered 2012-11-10 – 2012-11-14 (×5): 50 mg via ORAL
  Filled 2012-11-09 (×6): qty 1

## 2012-11-09 MED ORDER — SIMVASTATIN 40 MG PO TABS
40.0000 mg | ORAL_TABLET | Freq: Every day | ORAL | Status: DC
  Administered 2012-11-09 – 2012-11-13 (×5): 40 mg via ORAL
  Filled 2012-11-09 (×6): qty 1

## 2012-11-09 MED ORDER — ASPIRIN 300 MG RE SUPP
300.0000 mg | Freq: Every day | RECTAL | Status: DC
  Filled 2012-11-09 (×6): qty 1

## 2012-11-09 MED ORDER — SODIUM CHLORIDE 0.9 % IJ SOLN
10.0000 mL | INTRAMUSCULAR | Status: DC | PRN
  Administered 2012-11-09 – 2012-11-14 (×9): 10 mL

## 2012-11-09 MED ORDER — SORBITOL 70 % SOLN
30.0000 mL | Freq: Every day | Status: DC | PRN
  Administered 2012-11-11: 30 mL via ORAL
  Filled 2012-11-09: qty 30

## 2012-11-09 MED ORDER — ONDANSETRON HCL 4 MG/2ML IJ SOLN: 4.0000 mg | Freq: Four times a day (QID) | INTRAMUSCULAR | Status: DC | PRN

## 2012-11-09 MED ORDER — ONDANSETRON HCL 4 MG PO TABS: 4.0000 mg | ORAL_TABLET | Freq: Four times a day (QID) | ORAL | Status: DC | PRN

## 2012-11-09 MED ORDER — SIMVASTATIN 40 MG PO TABS: 40.0000 mg | ORAL_TABLET | Freq: Every day | ORAL | Status: DC

## 2012-11-09 MED ORDER — ASPIRIN 325 MG PO TABS
325.0000 mg | ORAL_TABLET | Freq: Every day | ORAL | Status: DC
  Administered 2012-11-10 – 2012-11-14 (×5): 325 mg via ORAL
  Filled 2012-11-09 (×6): qty 1

## 2012-11-09 MED ORDER — TRAMADOL HCL 50 MG PO TABS
50.0000 mg | ORAL_TABLET | Freq: Four times a day (QID) | ORAL | Status: DC | PRN
  Administered 2012-11-09 – 2012-11-13 (×5): 50 mg via ORAL
  Filled 2012-11-09 (×5): qty 1

## 2012-11-09 MED ORDER — ENOXAPARIN SODIUM 40 MG/0.4ML ~~LOC~~ SOLN: 40.0000 mg | SUBCUTANEOUS | Status: DC

## 2012-11-09 MED ORDER — IPRATROPIUM-ALBUTEROL 18-103 MCG/ACT IN AERO: 2.0000 | INHALATION_SPRAY | Freq: Four times a day (QID) | RESPIRATORY_TRACT | Status: DC | PRN

## 2012-11-09 MED ORDER — SODIUM CHLORIDE 0.9 % IJ SOLN
10.0000 mL | Freq: Two times a day (BID) | INTRAMUSCULAR | Status: DC
  Administered 2012-11-11: 10 mL

## 2012-11-09 MED ORDER — ACETAMINOPHEN 325 MG PO TABS
325.0000 mg | ORAL_TABLET | ORAL | Status: DC | PRN
  Administered 2012-11-13: 650 mg via ORAL
  Filled 2012-11-09 (×2): qty 2

## 2012-11-09 MED ORDER — INFLUENZA VIRUS VACC SPLIT PF IM SUSP
0.5000 mL | INTRAMUSCULAR | Status: AC | PRN
  Administered 2012-11-13: 0.5 mL via INTRAMUSCULAR
  Filled 2012-11-09: qty 0.5

## 2012-11-09 NOTE — Discharge Summary (Signed)
Physician Discharge Summary  Desiree Mitchell ZOX:[id number] DOB: 1956/05/09 DOA: 11/05/2012  PCP: No primary provider on file.  Admit date: 11/05/2012 Discharge date: 11/09/2012  Time spent: 40 minutes  Recommendations for Outpatient Follow-up:  1. Please follow up with Dr. Pearlean Brownie in stroke clinic in 1-2 months  Discharge Diagnoses:  Principal Problem:   Weakness of left side of body Active Problems:   HTN (hypertension)   H/O cerebral aneurysm repair   Obesity   Asthma  Discharge Condition: stable  Diet recommendation: heart healthy  There were no vitals filed for this visit.  History of present illness:  Desiree Mitchell is a 57 y.o. female PMH of breast cancer (s/p bilat mastectomy and chemotherapy; currently in remision and no receiving any treatment), HTN, and hydrocephalus (with VP shunt placement); came to ED after experiencing sudden onset of left side weakness and numbness. Patient reports symptoms first started Tuesday night (only her left leg was feeling heavy); then on Wednesday symptoms and heavy left side sensation worsen and start affecting her arm; patient decline seeking medical attention at that moment. On day of admission 11/05/12 patient unable move her left side, unable to walk w/o stumbling and also per family members started having difficulty speaking (some slurred speech and articulation difficulties). No code stroke activated due to delay in presentation. CT scan neg in ED  Hospital Course:  Weakness of left side of body: Sudden and still present on exam. Patient reports symptoms started about 24-30 hours prior to admission. Could not obtain an MRI due to shunt and unable to determine which type. CT showed nonspecific white matter changes in the right pons and left cerebellum. On full dose Aspirin and statin now and will be discharged to inpatient rehab.   HTN (hypertension): continue Toprol.  H/O cerebral aneurysm repair: no coils seen on CT head. Patent VP shunt.  Stable LE discomfort and mild swelling: L > R; LE dopplers negative for DVT  Procedures:  Carotid doppler No significant extracranial carotid artery stenosis demonstrated. Unable to visualize the right distal ICA due to patient anatomy. Vertebrals are patent with antegrade flow.  2D echo - Left ventricle: The cavity size was normal. Wall thickness was normal. Systolic function was normal. The estimated ejection fraction was in the range of 60% to 65%. Wall motion was normal; there were no regional wall motion abnormalities. Doppler parameters are consistent with abnormal left ventricular relaxation (grade 1 diastolic dysfunction). - Mitral valve: Trivial regurgitation. - Left atrium: The atrium was at the upper limits of normal in size. - Right atrium: Central venous pressure: 5mm Hg (est). - Atrial septum: No defect or patent foramen ovale was identified. - Tricuspid valve: Trivial regurgitation. - Pulmonary arteries: Systolic pressure could not be accurately estimated. - Pericardium, extracardiac: There was no pericardial effusion.  Left ventricle: The cavity size was normal. Wall thickness was normal. Systolic function was normal. The estimated ejection fraction was in the range of 60% to 65%. Wall motion was normal; there were no regional wall motion abnormalities. Doppler parameters are consistent with abnormal left ventricular relaxation (grade 1 diastolic dysfunction).  Consultations:  Neurology  Discharge Exam: Filed Vitals:   11/08/12 1818 11/08/12 2149 11/09/12 0255 11/09/12 0542  BP: 156/80 175/70 153/70 157/64  Pulse: 64 56 56 62  Temp: 98.3 F (36.8 C) 98.1 F (36.7 C) 97.6 F (36.4 C) 97.7 F (36.5 C)  TempSrc: Oral Oral Oral Oral  Resp: 17 16 16 15   Height:      SpO2:  98% 99% 98% 97%    General: NAD Cardiovascular: RRR Respiratory: CTA biL  Discharge Instructions     Medication List    TAKE these medications       acetaminophen 325  MG tablet  Commonly known as:  TYLENOL  Take 650 mg by mouth every 6 (six) hours as needed for pain.     albuterol-ipratropium 18-103 MCG/ACT inhaler  Commonly known as:  COMBIVENT  Inhale 2 puffs into the lungs every 6 (six) hours as needed for wheezing.     aspirin 325 MG tablet  Take 1 tablet (325 mg total) by mouth daily.     diphenhydramine-acetaminophen 25-500 MG Tabs  Commonly known as:  TYLENOL PM  Take 1 tablet by mouth at bedtime as needed (to sleep).     metoprolol succinate 50 MG 24 hr tablet  Commonly known as:  TOPROL-XL  Take 50 mg by mouth every morning. Take with or immediately following a meal.     simvastatin 40 MG tablet  Commonly known as:  ZOCOR  Take 1 tablet (40 mg total) by mouth daily at 6 PM.       Follow-up Information   Follow up with Gates Rigg, MD. Schedule an appointment as soon as possible for a visit in 1 month.   Contact information:   [address] [place] [address] [phone]      The results of significant diagnostics from this hospitalization (including imaging, microbiology, ancillary and laboratory) are listed below for reference.    Significant Diagnostic Studies: Dg Chest 2 View  11/05/2012  *RADIOLOGY REPORT*  Clinical Data: CVA  CHEST - 2 VIEW  Comparison: None.  Findings: Right Port-A-Cath tip:  SVC. VP shunt tubing noted.  The patient is rotated to the right on today's exam, resulting in reduced diagnostic sensitivity and specificity.   Cardiomegaly noted without edema.  No pleural effusion identified.  IMPRESSION:  1.  Cardiomegaly without edema. 2.  Right Port-A-Cath tip:  SVC.   Original Report Authenticated By: Gaylyn Rong, M.D.    Ct Head Wo Contrast  11/07/2012  *RADIOLOGY REPORT*  Clinical Data: Weakness and dizziness.  Breast cancer. Hypertension.  CT HEAD WITHOUT CONTRAST  Technique:  Contiguous axial images were obtained from the base of the skull through the vertex without contrast.   Comparison: 11/05/2012  Findings: Questionable small chronic linear lacunar infarct in the left cerebellum, image 6 of series 2.  Nonspecific 8 mm hypodensity in the right upper pons, image 8 of series 2.  Thalami intact. Basal ganglia intact.  The left parietal and colostomy catheter is present extending to the left lateral ventricle and terminating in the vicinity of the corpus callosum.  Patchy white matter hypodensities favor chronic ischemic microvascular white matter disease.  No intracranial hemorrhages observed.  No hydrocephalus.  IMPRESSION:  1.  8 mm hypodense lesion in the right upper pons.  This is nonspecific and only visible on this single image - although quite likely a chronic lacunar infarct, I cannot exclude a subacute lesion or small mass. 2.  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 3.  Patchy white matter hypodensities favor chronic ischemic microvascular white matter disease. 4.  Left parietal ventriculostomy catheter positioning stable, tip adjacent to the corpus callosum.  No hydrocephalus.   Original Report Authenticated By: Gaylyn Rong, M.D.    Ct Head Wo Contrast  11/05/2012  *RADIOLOGY REPORT*  Clinical Data: Left side numbness.  CT HEAD WITHOUT CONTRAST  Technique:  Contiguous axial images were obtained from the base of the skull through the vertex without contrast.  Comparison: None.  Findings: The patient has a left parietal approach ventriculostomy shunt catheter.  The catheter just crosses the midline.  The ventricles are decompressed.  Hypoattenuation in the subcortical and periventricular deep white matter is identified.  There is no evidence of acute abnormality including infarct, hemorrhage, mass lesion, mass effect, midline shift or abnormal extra-axial fluid collection.  No hydrocephalus or pneumocephalus.  Calvarium intact.  IMPRESSION: No acute finding with a ventriculostomy shunt catheter in place. Chronic microvascular ischemic change noted.    Original Report Authenticated By: Holley Dexter, M.D.    Ct Head W Wo Contrast  11/08/2012  *RADIOLOGY REPORT*  Clinical Data: Weakness and dizziness.  Breast cancer.  Abnormal CT scan.  CT HEAD WITHOUT AND WITH CONTRAST  Technique:  Contiguous axial images were obtained from the base of the skull through the vertex without and with intravenous contrast.  Contrast: 80mL OMNIPAQUE IOHEXOL 300 MG/ML  SOLN  Comparison: CT head without contrast 11/07/2012.  Findings: Extensive white matter changes are again noted.  The postcontrast images demonstrate no pathologic enhancement. Asymmetric white matter hypoattenuation in the right pons is confirmed.  There is asymmetric white matter disease in the left cerebellum as well.  A left parietal ventriculostomy shunt is in place.  The tip of the shunt is at the anterior genu of the corpus callosum.  The ventricles are normal size.  No significant extra-axial fluid collection is present.  IMPRESSION:  1.  Diffuse white matter disease is stable. 2.  White matter changes in the right pons and left cerebellum are again noted.  These are nonspecific. 3.  Left parietal shunt catheter. 4.  No acute intracranial abnormality or significant interval change.  Of note, most ventriculostomy shunt catheters are safe within the MRI environment, but the exact make and model is needed to determine if any reprogramming is necessary.   Original Report Authenticated By: Marin Roberts, M.D.     Microbiology: Recent Results (from the past 240 hour(s))  URINE CULTURE     Status: None   Collection Time    11/06/12 12:46 AM      Result Value Range Status   Specimen Description URINE, CLEAN CATCH   Final   Special Requests NONE   Final   Culture  Setup Time 11/06/2012 05:50   Final   Colony Count 9,000 COLONIES/ML   Final   Culture INSIGNIFICANT GROWTH   Final   Report Status 11/07/2012 FINAL   Final     Labs: Basic Metabolic Panel:  Recent Labs Lab 11/05/12 1328  11/05/12 1931  NA 144  --   K 3.9  --   CL 108  --   CO2 25  --   GLUCOSE 95  --   BUN 11  --   CREATININE 0.92 0.95  CALCIUM 9.1  --    CBC:  Recent Labs Lab 11/05/12 1328 11/05/12 1931  WBC 8.6 8.7  NEUTROABS 6.3  --   HGB 14.0 12.2  HCT 41.7 36.3  MCV 94.6 94.0  PLT 315 342   Cardiac Enzymes:  Recent Labs Lab 11/05/12 1905 11/06/12 0039 11/06/12 0640  TROPONINI <0.30 <0.30 <0.30     Signed:  Pamella Pert  [facility] 11/09/2012, 9:27 AM

## 2012-11-09 NOTE — H&P (Signed)
Physical Medicine and Rehabilitation Admission H&P    Chief Complaint  Patient presents with  . Stroke Symptoms  : HPI: Desiree Mitchell is a 57 y.o. right-handed female with history of breast cancer(status post bilateral mastectomy chemotherapy, currently in remission and not receiving any treatment), hypertension, hydrocephalus(cerebral aneurysm repair) with VP shunt placement x10 years. Admitted 11/05/2012 oh left-sided weakness. Cranial CT scan with diffuse white matter disease, left parietal ventriculostomy shunt in place. No acute intracranial abnormality noted. MRI of the brain completed a history of VP shunt. Patient did not receive TPA. Echocardiogram with ejection fraction of 65% grade 1 diastolic dysfunction. Venous Doppler studies of lower extremities negative for DVT. Carotid Dopplers with no ICA stenosis. Neurology services consulted presently on aspirin therapy for stroke prophylaxis as well as subcutaneous Lovenox.  Physical and occupational therapy evaluations completed with recommendations of physical medicine rehabilitation consult to consider inpatient rehabilitation services. Patient was felt to be a candidate for inpatient rehabilitation services and was admitted for a comprehensive rehabilitation program  Review of Systems  Respiratory: Positive for cough.  Gastrointestinal:  Reflux  Neurological: Positive for weakness.  All other systems reviewed and are negative   Past Medical History  Diagnosis Date  . Hypertension   . Breast cancer   . Hydrocephalus   . Asthma    Past Surgical History  Procedure Laterality Date  . Brain surgery      stent in head   No family history on file. Social History:  reports that she has never smoked. She does not have any smokeless tobacco history on file. She reports that  drinks alcohol. Her drug history is not on file. Allergies:  Allergies  Allergen Reactions  . Tape     blisters   Medications Prior to Admission  Medication  Sig Dispense Refill  . acetaminophen (TYLENOL) 325 MG tablet Take 650 mg by mouth every 6 (six) hours as needed for pain.      Marland Kitchen albuterol-ipratropium (COMBIVENT) 18-103 MCG/ACT inhaler Inhale 2 puffs into the lungs every 6 (six) hours as needed for wheezing.      . diphenhydramine-acetaminophen (TYLENOL PM) 25-500 MG TABS Take 1 tablet by mouth at bedtime as needed (to sleep).      . metoprolol succinate (TOPROL-XL) 50 MG 24 hr tablet Take 50 mg by mouth every morning. Take with or immediately following a meal.        Home: Home Living Lives With: Family;Other (Comment) Available Help at Discharge: Family;Available 24 hours/day Type of Home: House Home Access: Stairs to enter [employer] of Steps: 1 Entrance Stairs-Rails: None Home Layout: One level Bathroom Shower/Tub: Forensic scientist: Standard Bathroom Accessibility: Yes How Accessible: Accessible via walker Home Adaptive Equipment: Bedside commode/3-in-1;Walker - rolling   Functional History: Prior Function Able to Take Stairs?: Yes Driving: Yes Vocation: On disability  Functional Status:  Mobility: Bed Mobility Bed Mobility: Supine to Sit;Sitting - Scoot to Edge of Bed Supine to Sit: 5: Supervision;HOB flat;With rails Sitting - Scoot to Edge of Bed: 5: Supervision Transfers Transfers: Sit to Stand;Stand to Sit Sit to Stand: 4: Min guard;With upper extremity assist;From bed Stand to Sit: 4: Min guard;With upper extremity assist;With armrests;To chair/3-in-1 Ambulation/Gait Ambulation/Gait Assistance: 4: Min assist Ambulation Distance (Feet): 250 Feet Assistive device: None Ambulation/Gait Assistance Details: Pt continues to demonstrate increased effort with swing phase of left LE.  A for stability. Gait Pattern: Step-through pattern;Decreased step length - left;Decreased weight shift to left;Decreased hip/knee flexion - left Gait velocity:  decreased Stairs: No     ADL: ADL Eating/Feeding: Performed;Modified independent (using RUE) Where Assessed - Eating/Feeding: Chair Grooming: Performed;Brushing hair;Min guard (using RUE) Where Assessed - Grooming: Supported standing Upper Body Bathing: Simulated;Minimal assistance Where Assessed - Upper Body Bathing: Unsupported sitting Lower Body Bathing: Simulated;Minimal assistance Where Assessed - Lower Body Bathing: Supported sit to stand Upper Body Dressing: Simulated;Minimal assistance Where Assessed - Upper Body Dressing: Unsupported sitting Lower Body Dressing: Performed;Minimal assistance (don/doff socks) Where Assessed - Lower Body Dressing: Supported sit to Pharmacist, hospital: Mining engineer Method: Sit to Barista:  (chair) Equipment Used: Gait belt Transfers/Ambulation Related to ADLs: min assist for balance ADL Comments: Pt with decreased independence due to L UE/LE weakness. Educated pt on performing LUE AROM and AAROM (using right hand to hold left wrist).  Pt attempting to use iPAD on OT arrival.  Encouraged pt to try to use Left hand when navigating/playing games on iPad.    Cognition: Cognition Overall Cognitive Status: Appears within functional limits for tasks assessed Arousal/Alertness: Awake/alert Orientation Level: Oriented X4 Attention: Alternating Alternating Attention: Appears intact Memory: Appears intact Awareness: Appears intact Problem Solving: Appears intact Executive Function: Reasoning Reasoning: Appears intact Safety/Judgment: Appears intact Cognition Overall Cognitive Status: Appears within functional limits for tasks assessed/performed Arousal/Alertness: Awake/alert Orientation Level: Oriented X4 / Intact Behavior During Session: WFL for tasks performed Cognition - Other Comments: appears somewhat stressed and " on edge"  Physical Exam: Blood pressure 165/92, pulse 63, temperature 97.9 F (36.6 C),  temperature source Oral, resp. rate 17, height 5\' 1"  (1.549 m), SpO2 98.00%. Physical Exam  Vitals reviewed.  Constitutional: She is oriented to person, place, and time.  HENT: oral mucosa pink and moist Head: Normocephalic.  Eyes: EOM are normal.  Neck: Neck supple. No thyromegaly present. No jvd or lad Cardiovascular: Normal rate and regular rhythm. No murmurs Pulmonary/Chest: Effort normal and breath sounds normal. No respiratory distress. She has no wheezes.  Abdominal: Soft. Bowel sounds are normal. She exhibits no distension.  Neurological: She is alert and oriented to person, place, and time. Had some difficulty recalling recent hospitalizations  Follows three-step commands. Mild dysarthria and left facial weakness. LUE is 2+ proximally to 3- distally. LLE is 3- proximally to 4/5 distally. Subtle LT sensory loss left arm and leg. No visual spatial deficits. Reasonable insight and awareness. No gross limb ataxia appreciated on the left. Skin: Skin is warm and dry.  Psychiatric: She has a normal mood and affect   No results found for this or any previous visit (from the past 48 hour(s)). Ct Head W Wo Contrast  11/08/2012  *RADIOLOGY REPORT*  Clinical Data: Weakness and dizziness.  Breast cancer.  Abnormal CT scan.  CT HEAD WITHOUT AND WITH CONTRAST  Technique:  Contiguous axial images were obtained from the base of the skull through the vertex without and with intravenous contrast.  Contrast: 80mL OMNIPAQUE IOHEXOL 300 MG/ML  SOLN  Comparison: CT head without contrast 11/07/2012.  Findings: Extensive white matter changes are again noted.  The postcontrast images demonstrate no pathologic enhancement. Asymmetric white matter hypoattenuation in the right pons is confirmed.  There is asymmetric white matter disease in the left cerebellum as well.  A left parietal ventriculostomy shunt is in place.  The tip of the shunt is at the anterior genu of the corpus callosum.  The ventricles are normal  size.  No significant extra-axial fluid collection is present.  IMPRESSION:  1.  Diffuse white matter  disease is stable. 2.  White matter changes in the right pons and left cerebellum are again noted.  These are nonspecific. 3.  Left parietal shunt catheter. 4.  No acute intracranial abnormality or significant interval change.  Of note, most ventriculostomy shunt catheters are safe within the MRI environment, but the exact make and model is needed to determine if any reprogramming is necessary.   Original Report Authenticated By: Marin Roberts, M.D.     Post Admission Physician Evaluation: 1. Functional deficits secondary  to right pons and ?left cerebellum. 2. Patient is admitted to receive collaborative, interdisciplinary care between the physiatrist, rehab nursing staff, and therapy team. 3. Patient's level of medical complexity and substantial therapy needs in context of that medical necessity cannot be provided at a lesser intensity of care such as a SNF. 4. Patient has experienced substantial functional loss from his/her baseline which was documented above under the "Functional History" and "Functional Status" headings.  Judging by the patient's diagnosis, physical exam, and functional history, the patient has potential for functional progress which will result in measurable gains while on inpatient rehab.  These gains will be of substantial and practical use upon discharge  in facilitating mobility and self-care at the household level. 5. Physiatrist will provide 24 hour management of medical needs as well as oversight of the therapy plan/treatment and provide guidance as appropriate regarding the interaction of the two. 6. 24 hour rehab nursing will assist with bladder management, bowel management, safety, skin/wound care, disease management, medication administration, pain management and patient education  and help integrate therapy concepts, techniques,education, etc. 7. PT will assess and  treat for/with: Lower extremity strength, range of motion, stamina, balance, functional mobility, safety, adaptive techniques and equipment, NMR, education.   Goals are: mod I. 8. OT will assess and treat for/with: ADL's, functional mobility, safety, upper extremity strength, adaptive techniques and equipment, NMR, education.   Goals are: mod I. 9. SLP will assess and treat for/with: speech, communication.  Goals are: mod I. 10. Case Management and Social Worker will assess and treat for psychological issues and discharge planning. 11. Team conference will be held weekly to assess progress toward goals and to determine barriers to discharge. 12. Patient will receive at least 3 hours of therapy per day at least 5 days per week. 13. ELOS: 7-10 days      Prognosis:  excellent   Medical Problem List and Plan: 1. 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 DVT Prophylaxis/Anticoagulation: Subcutaneous Lovenox. Monitor platelet counts any signs of bleeding. Recheck in the am 3. Pain Management: Ultram as needed. Monitor with increased mobility 4. Neuropsych: This patient is capable of making decisions on his/her own behalf. 5. History of VP shunt/hydrocephalus x10 years 6. History of breast cancer status post bilateral mastectomy chemotherapy. Currently in remission and not receiving any treatment. 7. History of asthma. Combivent inhaler as needed. Denies any shortness of breath 8. Hypertension. Toprol-XL 50 mg daily. Monitor with increased mobility and adjust as indicated 9. Hyperlipidemia. Zocor  Ranelle Oyster, MD, [place] [facility]  11/09/2012

## 2012-11-09 NOTE — Progress Notes (Signed)
Pt reported having double mastectomy with one year this past December. Pt had IV saline lock to her right hand which she states was started before coming to the unit from the ED. From nursing judgement; pt IV was removed and dc'd. No s/s of swelling etc noted; Pt reported having deaccessed Port-Cath, RN IV team notified of the situation and now on unit. Will continue to monitor pt quietly.

## 2012-11-09 NOTE — Progress Notes (Signed)
Rehab admissions - Evaluated for possible admission.  I spoke with patient.  She would like to come to inpatient rehab prior to home with her friend (she call her a sister).  Bed available and can admit to acute inpatient rehab today.  Call me for questions.  #[phone]

## 2012-11-09 NOTE — PMR Pre-admission (Signed)
PMR Admission Coordinator Pre-Admission Assessment  Patient: Desiree Mitchell is an 57 y.o., female MRN: [id number] DOB: Nov 06, 1955 Height: 5\' 1"  (154.9 cm) Weight: Estimated 132 lbs               Insurance Information HMO:      PPO:       PCP:       IPA:       80/20:       OTHER:   PRIMARY: Medicaid      Policy#: [id number]      Subscriber: Daine Gip CM Name:        Phone#:       Fax#:   Pre-Cert#:        Employer: [employer] Benefits:  Phone #: [phone]     Name: Automated Eff. Date: Eligible 11/09/12     Deduct:        Out of Pocket Max:        Life Max:   CIR:        SNF:   Outpatient:       Co-Pay:   Home Health:        Co-Pay:   DME:       Co-Pay:   Providers:    Emergency Contact Information Contact Information   Name Relation Home Work Mobile   [place] Other [phone]     Lorella Nimrod Daughter   [phone]     Current Medical History  Patient Admitting Diagnosis: R Leonidas CVA  History of Present Illness: A 57 y.o. right-handed female with history of breast cancer(status post bilateral mastectomy chemotherapy, currently in remission and not receiving any treatment), hypertension, hydrocephalus(cerebral aneurysm repair) with VP shunt placement x10 years. Admitted 11/05/2012 oh left-sided weakness. Cranial CT scan with diffuse white matter disease, left parietal ventriculostomy shunt in place. No acute intracranial abnormality noted. MRI of the brain is pending. Patient did not receive TPA. Echocardiogram with ejection fraction of 65% grade 1 diastolic dysfunction. Venous Doppler studies of lower extremities negative for DVT. Carotid Dopplers with no ICA stenosis. Neurology services consulted presently on aspirin therapy for stroke prophylaxis as well as subcutaneous Lovenox. Full neurological workup ongoing. Physical and occupational therapy evaluations completed with recommendations of physical medicine rehabilitation consult to consider inpatient rehabilitation  services.   Total: 4=NIH  Past Medical History  Past Medical History  Diagnosis Date  . Hypertension   . Breast cancer   . Hydrocephalus   . Asthma     Family History  family history is not on file.  Prior Rehab/Hospitalizations:  Had some kind of rehab after aneurysm and shunt placement 7 yrs ago, but patient does not remember much about that time.   Current Medications  Current facility-administered medications:albuterol-ipratropium (COMBIVENT) inhaler 2 puff, 2 puff, Inhalation, Q6H PRN, Vassie Loll, MD;  aspirin suppository 300 mg, 300 mg, Rectal, Daily, Vassie Loll, MD;  aspirin tablet 325 mg, 325 mg, Oral, Daily, Vassie Loll, MD, 325 mg at 11/09/12 1014;  enoxaparin (LOVENOX) injection 40 mg, 40 mg, Subcutaneous, Q24H, Vassie Loll, MD, 40 mg at 11/08/12 2144 influenza  inactive virus vaccine (FLUZONE/FLUARIX) injection 0.5 mL, 0.5 mL, Intramuscular, Tomorrow-1000, Costin Gherghe, MD;  metoprolol succinate (TOPROL-XL) 24 hr tablet 50 mg, 50 mg, Oral, q morning - 10a, Vassie Loll, MD, 50 mg at 11/09/12 1014;  ondansetron (ZOFRAN) injection 4 mg, 4 mg, Intravenous, Q6H PRN, Vassie Loll, MD;  pantoprazole (PROTONIX) EC tablet 40 mg, 40 mg, Oral, Q1200, Vassie Loll, MD, 40 mg at 11/08/12 1238  simvastatin (ZOCOR) tablet 40 mg, 40 mg, Oral, q1800, Pamella Pert, MD, 40 mg at 11/08/12 1713;  traMADol (ULTRAM) tablet 50 mg, 50 mg, Oral, Q6H PRN, Jinger Neighbors, NP, 50 mg at 11/09/12 0347  Patients Current Diet: Cardiac  Precautions / Restrictions Precautions Precautions: Fall Restrictions Weight Bearing Restrictions: No   Prior Activity Level Community (5-7x/wk): Went out daily.  Walked to [employer] almost daily.  Home Assistive Devices / Equipment Home Assistive Devices/Equipment: None Home Adaptive Equipment: Bedside commode/3-in-1;Walker - rolling  Prior Functional Level Prior Function Level of Independence: Independent Able to Take Stairs?: Yes Driving:  Yes Vocation: On disability  Current Functional Level Cognition  Arousal/Alertness: Awake/alert Overall Cognitive Status: Appears within functional limits for tasks assessed Overall Cognitive Status: Appears within functional limits for tasks assessed/performed Orientation Level: Oriented X4 Cognition - Other Comments: appears somewhat stressed and " on edge" Attention: Alternating Alternating Attention: Appears intact Memory: Appears intact Awareness: Appears intact Problem Solving: Appears intact Executive Function: Reasoning Reasoning: Appears intact Safety/Judgment: Appears intact    Extremity Assessment (includes Sensation/Coordination)  RUE ROM/Strength/Tone: WFL for tasks assessed RUE Sensation: WFL - Light Touch;WFL - Proprioception RUE Coordination: WFL - gross/fine motor  RLE ROM/Strength/Tone: Within functional levels RLE Sensation: WFL - Light Touch RLE Coordination: WFL - gross/fine motor    ADLs  Eating/Feeding: Performed;Modified independent (using RUE) Where Assessed - Eating/Feeding: Chair Grooming: Performed;Brushing hair;Min guard (using RUE) Where Assessed - Grooming: Supported standing Upper Body Bathing: Simulated;Minimal assistance Where Assessed - Upper Body Bathing: Unsupported sitting Lower Body Bathing: Simulated;Minimal assistance Where Assessed - Lower Body Bathing: Supported sit to stand Upper Body Dressing: Simulated;Minimal assistance Where Assessed - Upper Body Dressing: Unsupported sitting Lower Body Dressing: Performed;Minimal assistance (don/doff socks) Where Assessed - Lower Body Dressing: Supported sit to Pharmacist, hospital: Mining engineer Method: Sit to Barista:  (chair) Equipment Used: Gait belt Transfers/Ambulation Related to ADLs: min assist for balance ADL Comments: Pt with decreased independence due to L UE/LE weakness. Educated pt on performing LUE AROM and AAROM (using  right hand to hold left wrist).  Pt attempting to use iPAD on OT arrival.  Encouraged pt to try to use Left hand when navigating/playing games on iPad.      Mobility  Bed Mobility: Supine to Sit;Sitting - Scoot to Edge of Bed Supine to Sit: 5: Supervision;HOB flat;With rails Sitting - Scoot to Edge of Bed: 5: Supervision    Transfers  Transfers: Sit to Stand;Stand to Sit Sit to Stand: 4: Min guard;With upper extremity assist;From bed Stand to Sit: 4: Min guard;With upper extremity assist;With armrests;To chair/3-in-1    Ambulation / Gait / Stairs / Psychologist, prison and probation services  Ambulation/Gait Ambulation/Gait Assistance: 4: Min Environmental consultant (Feet): 250 Feet Assistive device: None Ambulation/Gait Assistance Details: Pt continues to demonstrate increased effort with swing phase of left LE.  A for stability. Gait Pattern: Step-through pattern;Decreased step length - left;Decreased weight shift to left;Decreased hip/knee flexion - left Gait velocity: decreased Stairs: No    Posture / Balance Static Sitting Balance Static Sitting - Balance Support: No upper extremity supported;Feet supported Static Sitting - Level of Assistance: 6: Modified independent (Device/Increase time) Static Standing Balance Static Standing - Balance Support: No upper extremity supported;During functional activity Static Standing - Level of Assistance: 5: Stand by assistance;4: Min assist (close guarding for safety)    Special needs/care consideration BiPAP/CPAP No CPM No Continuous Drip IV No. However does have an access port in her R  chest.  Port was flushed 11/09/12. Dialysis No       Life Vest No Oxygen No Special Bed No Trach Size No Wound Vac (area) No      Skin Port under skin R chest with dressing intact.                               Bowel mgmt: Had BM 11/08/12 Bladder mgmt: Voiding in bathroom with assist.  Had one accident 4 days ago. Diabetic mgmt No    Previous Home Environment Living  Arrangements: Other relatives Lives With: Family;Other (Comment) Available Help at Discharge: Family;Available 24 hours/day Type of Home: House Home Layout: One level Home Access: Stairs to enter Entrance Stairs-Rails: None Entrance Stairs-Number of Steps: 1 Bathroom Shower/Tub: Forensic scientist: Standard Bathroom Accessibility: Yes How Accessible: Accessible via walker Home Care Services: No  Discharge Living Setting Plans for Discharge Living Setting: House;Lives with (comment) (Lives with a friend who she says is like a sister.) Type of Home at Discharge: House Discharge Home Layout: One level Discharge Home Access: Stairs to enter Entrance Stairs-Number of Steps: 4 steps back and 2 steps front porch entry. Do you have any problems obtaining your medications?: No  Social/Family/Support Systems Patient Roles: Parent (Widow for over a year.  ) Contact Information: Elsie Lincoln - friend (c) [phone] Anticipated Caregiver: friend Ability/Limitations of Caregiver: Friend can provide supervision. Caregiver Availability: 24/7 Discharge Plan Discussed with Primary Caregiver: Yes Is Caregiver In Agreement with Plan?: Yes Does Caregiver/Family have Issues with Lodging/Transportation while Pt is in Rehab?: No  Goals/Additional Needs Patient/Family Goal for Rehab: PT/OT mod I/S, ST mod I goals Expected length of stay: 7-10 days Cultural Considerations: [employer] Dietary Needs: Heart diet Equipment Needs: TBD Additional Information: Had breast cancer with chemo and radiation and bilateral mastectomy over a year ago 12/12. Pt/Family Agrees to Admission and willing to participate: Yes Program Orientation Provided & Reviewed with Pt/Caregiver Including Roles  & Responsibilities: Yes   Decrease burden of Care through IP rehab admission: Not applicable   Possible need for SNF placement upon discharge:  Not likely   Patient Condition: This patient's  condition remains as documented in the consult dated 11/09/12, in which the Rehabilitation Physician determined and documented that the patient's condition is appropriate for intensive rehabilitative care in an inpatient rehabilitation facility. Will admit to inpatient rehab today.  Preadmission Screen Completed By:  Trish Mage, 11/09/2012 12:07 PM ______________________________________________________________________   Discussed status with Dr. Riley Kill on 11/09/12 at 1203 and received telephone approval for admission today.  Admission Coordinator:  Trish Mage, time1203/Date03/31/14

## 2012-11-09 NOTE — H&P (Signed)
Physical Medicine and Rehabilitation Admission H&P  Chief Complaint   Patient presents with   .  Stroke Symptoms   :  HPI: Desiree Mitchell is a 57 y.o. right-handed female with history of breast cancer(status post bilateral mastectomy chemotherapy, currently in remission and not receiving any treatment), hypertension, hydrocephalus(cerebral aneurysm repair) with VP shunt placement x10 years. Admitted 11/05/2012 oh left-sided weakness. Cranial CT scan with diffuse white matter disease, left parietal ventriculostomy shunt in place. No acute intracranial abnormality noted. MRI of the brain completed a history of VP shunt. Patient did not receive TPA. Echocardiogram with ejection fraction of 65% grade 1 diastolic dysfunction. Venous Doppler studies of lower extremities negative for DVT. Carotid Dopplers with no ICA stenosis. Neurology services consulted presently on aspirin therapy for stroke prophylaxis as well as subcutaneous Lovenox. Physical and occupational therapy evaluations completed with recommendations of physical medicine rehabilitation consult to consider inpatient rehabilitation services. Patient was felt to be a candidate for inpatient rehabilitation services and was admitted for a comprehensive rehabilitation program  Review of Systems  Respiratory: Positive for cough.  Gastrointestinal:  Reflux  Neurological: Positive for weakness.  All other systems reviewed and are negative  Past Medical History   Diagnosis  Date   .  Hypertension    .  Breast cancer    .  Hydrocephalus    .  Asthma     Past Surgical History   Procedure  Laterality  Date   .  Brain surgery       stent in head    No family history on file.  Social History: reports that she has never smoked. She does not have any smokeless tobacco history on file. She reports that drinks alcohol. Her drug history is not on file.  Allergies:  Allergies   Allergen  Reactions   .  Tape      blisters    Medications Prior to  Admission   Medication  Sig  Dispense  Refill   .  acetaminophen (TYLENOL) 325 MG tablet  Take 650 mg by mouth every 6 (six) hours as needed for pain.     Marland Kitchen  albuterol-ipratropium (COMBIVENT) 18-103 MCG/ACT inhaler  Inhale 2 puffs into the lungs every 6 (six) hours as needed for wheezing.     .  diphenhydramine-acetaminophen (TYLENOL PM) 25-500 MG TABS  Take 1 tablet by mouth at bedtime as needed (to sleep).     .  metoprolol succinate (TOPROL-XL) 50 MG 24 hr tablet  Take 50 mg by mouth every morning. Take with or immediately following a meal.      Home:  Home Living  Lives With: Family;Other (Comment)  Available Help at Discharge: Family;Available 24 hours/day  Type of Home: House  Home Access: Stairs to enter  [employer] of Steps: 1  Entrance Stairs-Rails: None  Home Layout: One level  Bathroom Shower/Tub: Sports administrator: Standard  Bathroom Accessibility: Yes  How Accessible: Accessible via walker  Home Adaptive Equipment: Bedside commode/3-in-1;Walker - rolling  Functional History:  Prior Function  Able to Take Stairs?: Yes  Driving: Yes  Vocation: On disability  Functional Status:  Mobility:  Bed Mobility  Bed Mobility: Supine to Sit;Sitting - Scoot to Edge of Bed  Supine to Sit: 5: Supervision;HOB flat;With rails  Sitting - Scoot to Edge of Bed: 5: Supervision  Transfers  Transfers: Sit to Stand;Stand to Sit  Sit to Stand: 4: Min guard;With upper extremity assist;From bed  Stand to Sit: 4: Min  guard;With upper extremity assist;With armrests;To chair/3-in-1  Ambulation/Gait  Ambulation/Gait Assistance: 4: Min assist  Ambulation Distance (Feet): 250 Feet  Assistive device: None  Ambulation/Gait Assistance Details: Pt continues to demonstrate increased effort with swing phase of left LE. A for stability.  Gait Pattern: Step-through pattern;Decreased step length - left;Decreased weight shift to left;Decreased hip/knee flexion - left   Gait velocity: decreased  Stairs: No   ADL:  ADL  Eating/Feeding: Performed;Modified independent (using RUE)  Where Assessed - Eating/Feeding: Chair  Grooming: Performed;Brushing hair;Min guard (using RUE)  Where Assessed - Grooming: Supported standing  Upper Body Bathing: Simulated;Minimal assistance  Where Assessed - Upper Body Bathing: Unsupported sitting  Lower Body Bathing: Simulated;Minimal assistance  Where Assessed - Lower Body Bathing: Supported sit to stand  Upper Body Dressing: Simulated;Minimal assistance  Where Assessed - Upper Body Dressing: Unsupported sitting  Lower Body Dressing: Performed;Minimal assistance (don/doff socks)  Where Assessed - Lower Body Dressing: Supported sit to Scientist, research (life sciences): Corporate investment banker Method: Sit to Production manager: (chair)  Equipment Used: Gait belt  Transfers/Ambulation Related to ADLs: min assist for balance  ADL Comments: Pt with decreased independence due to L UE/LE weakness. Educated pt on performing LUE AROM and AAROM (using right hand to hold left wrist). Pt attempting to use iPAD on OT arrival. Encouraged pt to try to use Left hand when navigating/playing games on iPad.  Cognition:  Cognition  Overall Cognitive Status: Appears within functional limits for tasks assessed  Arousal/Alertness: Awake/alert  Orientation Level: Oriented X4  Attention: Alternating  Alternating Attention: Appears intact  Memory: Appears intact  Awareness: Appears intact  Problem Solving: Appears intact  Executive Function: Reasoning  Reasoning: Appears intact  Safety/Judgment: Appears intact  Cognition  Overall Cognitive Status: Appears within functional limits for tasks assessed/performed  Arousal/Alertness: Awake/alert  Orientation Level: Oriented X4 / Intact  Behavior During Session: WFL for tasks performed  Cognition - Other Comments: appears somewhat stressed and " on edge"  Physical  Exam:  Blood pressure 165/92, pulse 63, temperature 97.9 F (36.6 C), temperature source Oral, resp. rate 17, height 5\' 1"  (1.549 m), SpO2 98.00%.  Physical Exam  Vitals reviewed.  Constitutional: She is oriented to person, place, and time.  HENT: oral mucosa pink and moist  Head: Normocephalic.  Eyes: EOM are normal.  Neck: Neck supple. No thyromegaly present. No jvd or lad  Cardiovascular: Normal rate and regular rhythm. No murmurs  Pulmonary/Chest: Effort normal and breath sounds normal. No respiratory distress. She has no wheezes.  Abdominal: Soft. Bowel sounds are normal. She exhibits no distension.  Neurological: She is alert and oriented to person, place, and time. Had some difficulty recalling recent hospitalizations  Follows three-step commands. Mild dysarthria and left facial weakness. LUE is 2+ proximally to 3- distally. LLE is 3- proximally to 4/5 distally. Subtle LT sensory loss left arm and leg. No visual spatial deficits. Reasonable insight and awareness. No gross limb ataxia appreciated on the left.  Skin: Skin is warm and dry.  Psychiatric: She has a normal mood and affect  No results found for this or any previous visit (from the past 48 hour(s)).  Ct Head W Wo Contrast  11/08/2012 *RADIOLOGY REPORT* Clinical Data: Weakness and dizziness. Breast cancer. Abnormal CT scan. CT HEAD WITHOUT AND WITH CONTRAST Technique: Contiguous axial images were obtained from the base of the skull through the vertex without and with intravenous contrast. Contrast: 80mL OMNIPAQUE IOHEXOL 300 MG/ML SOLN Comparison: CT  head without contrast 11/07/2012. Findings: Extensive white matter changes are again noted. The postcontrast images demonstrate no pathologic enhancement. Asymmetric white matter hypoattenuation in the right pons is confirmed. There is asymmetric white matter disease in the left cerebellum as well. A left parietal ventriculostomy shunt is in place. The tip of the shunt is at the anterior  genu of the corpus callosum. The ventricles are normal size. No significant extra-axial fluid collection is present. IMPRESSION: 1. Diffuse white matter disease is stable. 2. White matter changes in the right pons and left cerebellum are again noted. These are nonspecific. 3. Left parietal shunt catheter. 4. No acute intracranial abnormality or significant interval change. Of note, most ventriculostomy shunt catheters are safe within the MRI environment, but the exact make and model is needed to determine if any reprogramming is necessary. Original Report Authenticated By: Marin Roberts, M.D.   Post Admission Physician Evaluation:  1. Functional deficits secondary to acute right pontine infarct and potentially also due to the acute left cerebellar infarct 2. Patient is admitted to receive collaborative, interdisciplinary care between the physiatrist, rehab nursing staff, and therapy team. 3. Patient's level of medical complexity and substantial therapy needs in context of that medical necessity cannot be provided at a lesser intensity of care such as a SNF. 4. Patient has experienced substantial functional loss from his/her baseline which was documented above under the "Functional History" and "Functional Status" headings. Judging by the patient's diagnosis, physical exam, and functional history, the patient has potential for functional progress which will result in measurable gains while on inpatient rehab. These gains will be of substantial and practical use upon discharge in facilitating mobility and self-care at the household level. 5. Physiatrist will provide 24 hour management of medical needs as well as oversight of the therapy plan/treatment and provide guidance as appropriate regarding the interaction of the two. 6. 24 hour rehab nursing will assist with bladder management, bowel management, safety, skin/wound care, disease management, medication administration, pain management and patient  education and help integrate therapy concepts, techniques,education, etc. 7. PT will assess and treat for/with: Lower extremity strength, range of motion, stamina, balance, functional mobility, safety, adaptive techniques and equipment, NMR, education. Goals are: mod I. 8. OT will assess and treat for/with: ADL's, functional mobility, safety, upper extremity strength, adaptive techniques and equipment, NMR, education. Goals are: mod I. 9. SLP will assess and treat for/with: speech, communication. Goals are: mod I. 10. Case Management and Social Worker will assess and treat for psychological issues and discharge planning. 11. Team conference will be held weekly to assess progress toward goals and to determine barriers to discharge. 12. Patient will receive at least 3 hours of therapy per day at least 5 days per week. 13. ELOS: 7-10 days Prognosis: excellent   Medical Problem List and Plan:  1. 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 DVT Prophylaxis/Anticoagulation: Subcutaneous Lovenox. Monitor platelet counts any signs of bleeding. Recheck in the am  3. Pain Management: Ultram as needed. Monitor with increased mobility  4. Neuropsych: This patient is capable of making decisions on his/her own behalf.  5. History of VP shunt/hydrocephalus x10 years  6. History of breast cancer status post bilateral mastectomy chemotherapy. Currently in remission and not receiving any treatment.  7. History of asthma. Combivent inhaler as needed. Denies any shortness of breath  8. Hypertension. Toprol-XL 50 mg daily. Monitor with increased mobility and adjust as indicated  9. Hyperlipidemia. Zocor  Ranelle Oyster,  MD, [place] [facility]  11/09/2012

## 2012-11-09 NOTE — Progress Notes (Signed)
Physical Therapy Treatment Patient Details Name: Desiree Mitchell MRN: [id number] DOB: [birth year] Today's Date: 11/09/2012 Time: 4098-1191 PT Time Calculation (min): 29 min  PT Assessment / Plan / Recommendation Comments on Treatment Session  Pt able to increase ambulation distance, but seems insecure due to left sided weakness.  Pt instructed and demonstrated general LE exercises to address weakness.  Edema was present in left UE this session.  Pt indicated it started this morning.  Left pt with left UE elevated on pillows.      Follow Up Recommendations  CIR     Does the patient have the potential to tolerate intense rehabilitation     Barriers to Discharge        Equipment Recommendations  Other (comment) (TBD)    Recommendations for Other Services    Frequency Min 4X/week   Plan Discharge plan remains appropriate;Frequency remains appropriate    Precautions / Restrictions Precautions Precautions: Fall Restrictions Weight Bearing Restrictions: No   Pertinent Vitals/Pain Pt reports no pain, but has increased discomfort.    Mobility  Bed Mobility Bed Mobility: Supine to Sit;Sitting - Scoot to Edge of Bed Supine to Sit: 5: Supervision;HOB flat;With rails Sitting - Scoot to Edge of Bed: 5: Supervision Details for Bed Mobility Assistance: Pt able to accomplish bed mobility with extra time, encouragement to use left UE. Transfers Transfers: Sit to Stand;Stand to Sit Sit to Stand: 4: Min guard;With upper extremity assist;From bed Stand to Sit: 4: Min guard;With upper extremity assist;With armrests;To chair/3-in-1 Details for Transfer Assistance: Min guard due to left sided weakness. Ambulation/Gait Ambulation/Gait Assistance: 4: Min assist Ambulation Distance (Feet): 250 Feet Assistive device: None Ambulation/Gait Assistance Details: Pt continues to demonstrate increased effort with swing phase of left LE.  A for stability. Gait Pattern: Step-through pattern;Decreased step  length - left;Decreased weight shift to left;Decreased hip/knee flexion - left Gait velocity: decreased    Exercises General Exercises - Lower Extremity Gluteal Sets: AROM;Strengthening;Both;10 reps;Seated Long Arc Quad: AROM;Strengthening;Both;10 reps;Seated Heel Slides: AROM;Both;Strengthening;10 reps;Seated Toe Raises: AROM;Strengthening;Both;10 reps;Seated Heel Raises: AROM;Strengthening;10 reps;Seated;Both   PT Diagnosis:    PT Problem List:   PT Treatment Interventions:     PT Goals Acute Rehab PT Goals Time For Goal Achievement: 11/13/12 Potential to Achieve Goals: Good Pt will go Supine/Side to Sit: with modified independence;with HOB 0 degrees PT Goal: Supine/Side to Sit - Progress: Progressing toward goal Pt will go Sit to Stand: with modified independence PT Goal: Sit to Stand - Progress: Progressing toward goal Pt will go Stand to Sit: with modified independence PT Goal: Stand to Sit - Progress: Progressing toward goal Pt will Ambulate: >150 feet;with least restrictive assistive device;with modified independence PT Goal: Ambulate - Progress: Progressing toward goal  Visit Information  Last PT Received On: 11/09/12 Assistance Needed: +1    Subjective Data  Subjective: Pt reports no pain but she feels weakness and discomfort.   Cognition  Cognition Overall Cognitive Status: Appears within functional limits for tasks assessed/performed Arousal/Alertness: Awake/alert Orientation Level: Oriented X4 / Intact Behavior During Session: WFL for tasks performed    Balance     End of Session PT - End of Session Equipment Utilized During Treatment: Gait belt Activity Tolerance: Patient tolerated treatment well Patient left: in chair;with call bell/phone within reach Nurse Communication: Mobility status   GP     Enid Baas, SPTA 11/09/2012, 9:11 AM

## 2012-11-09 NOTE — Progress Notes (Signed)
Patient admitted to 4005 at 1540. Patient alert and oriented x4, arrived in wheelchair and transferred with supervision to bed. Patient oriented to unit, rehab schedule, safety plan, and call bell system. Rehab stroke education packet given. Bed alarm in place, encouraged to call for assist. No c/o pain. Patient verbalized understanding of education. Hedy Camara

## 2012-11-09 NOTE — Progress Notes (Signed)
Occupational Therapy Treatment Patient Details Name: Desiree Mitchell MRN: [id number] DOB: 1956/08/06 Today's Date: 11/09/2012 Time: 4098-1191 OT Time Calculation (min): 17 min  OT Assessment / Plan / Recommendation Comments on Treatment Session pt making progress and doing well. pt scheduled to d/c to CIR today    Follow Up Recommendations  CIR    Barriers to Discharge       Equipment Recommendations  Tub/shower bench    Recommendations for Other Services    Frequency     Plan Discharge plan remains appropriate    Precautions / Restrictions Precautions Precautions: Fall Restrictions Weight Bearing Restrictions: No   Pertinent Vitals/Pain     ADL  Toilet Transfer: Performed;Min guard Toilet Transfer Method: Sit to stand    OT Diagnosis:    OT Problem List:   OT Treatment Interventions:     OT Goals ADL Goals ADL Goal: Toilet Transfer - Progress: Progressing toward goals Miscellaneous OT Goals OT Goal: Miscellaneous Goal #1 - Progress: Progressing toward goals  Visit Information  Last OT Received On: 11/09/12    Subjective Data  Subjective: " I hope I can goto rehab today " Patient Stated Goal: To return home   Prior Functioning       Cognition  Cognition Overall Cognitive Status: Appears within functional limits for tasks assessed/performed Arousal/Alertness: Awake/alert Orientation Level: Oriented X4 / Intact Behavior During Session: [facility] for tasks performed    Mobility  Bed Mobility Bed Mobility: Not assessed Transfers Transfers: Sit to Stand;Stand to Sit Sit to Stand: 4: Min guard;Without upper extremity assist;From chair/3-in-1;From toilet Stand to Sit: To chair/3-in-1;To toilet;4: Min guard;Without upper extremity assist    Exercises  Other Exercises Other Exercises: pt stood at tabletop x 3 minutes x 2 for B UE activity    Balance Balance Balance Assessed: Yes Static Standing Balance Static Standing - Balance Support: No upper extremity  supported;During functional activity Static Standing - Level of Assistance: 5: Stand by assistance;Other (comment) (min guard A)   End of Session OT - End of Session Equipment Utilized During Treatment: Gait belt Activity Tolerance: Patient tolerated treatment well Patient left: in chair;with call bell/phone within reach  GO     Desiree Mitchell 11/09/2012, 3:49 PM

## 2012-11-09 NOTE — Consult Note (Signed)
Physical Medicine and Rehabilitation Consult Reason for Consult: Question CVA Referring Physician: [facility]   HPI: Desiree Mitchell is a 57 y.o. right-handed female with history of breast cancer(status post bilateral mastectomy chemotherapy, currently in remission and not receiving any treatment), hypertension, hydrocephalus(cerebral aneurysm repair) with VP shunt placement x10 years. Admitted 11/05/2012 oh left-sided weakness. Cranial CT scan with diffuse white matter disease, left parietal ventriculostomy shunt in place. No acute intracranial abnormality noted. MRI of the brain is pending. Patient did not receive TPA. Echocardiogram with ejection fraction of 65% grade 1 diastolic dysfunction. Venous Doppler studies of lower extremities negative for DVT. Carotid Dopplers with no ICA stenosis. Neurology services consulted presently on aspirin therapy for stroke prophylaxis as well as subcutaneous Lovenox. Full neurological workup ongoing. Physical and occupational therapy evaluations completed with recommendations of physical medicine rehabilitation consult to consider inpatient rehabilitation services   Review of Systems  Respiratory: Positive for cough.   Gastrointestinal:       Reflux  Neurological: Positive for weakness.  All other systems reviewed and are negative.   Past Medical History  Diagnosis Date  . Hypertension   . Breast cancer   . Hydrocephalus   . Asthma    Past Surgical History  Procedure Laterality Date  . Brain surgery      stent in head   No family history on file. Social History:  reports that she has never smoked. She does not have any smokeless tobacco history on file. She reports that  drinks alcohol. Her drug history is not on file. Allergies:  Allergies  Allergen Reactions  . Tape     blisters   Medications Prior to Admission  Medication Sig Dispense Refill  . acetaminophen (TYLENOL) 325 MG tablet Take 650 mg by mouth every 6 (six) hours as needed for pain.       Marland Kitchen albuterol-ipratropium (COMBIVENT) 18-103 MCG/ACT inhaler Inhale 2 puffs into the lungs every 6 (six) hours as needed for wheezing.      . diphenhydramine-acetaminophen (TYLENOL PM) 25-500 MG TABS Take 1 tablet by mouth at bedtime as needed (to sleep).      . metoprolol succinate (TOPROL-XL) 50 MG 24 hr tablet Take 50 mg by mouth every morning. Take with or immediately following a meal.        Home: Home Living Lives With: Family;Other (Comment) Available Help at Discharge: Family;Available 24 hours/day Type of Home: House Home Access: Stairs to enter [employer] of Steps: 1 Entrance Stairs-Rails: None Home Layout: One level Bathroom Shower/Tub: Forensic scientist: Standard Bathroom Accessibility: Yes How Accessible: Accessible via walker Home Adaptive Equipment: Bedside commode/3-in-1;Walker - rolling  Functional History: Prior Function Able to Take Stairs?: Yes Driving: Yes Vocation: On disability Functional Status:  Mobility: Bed Mobility Bed Mobility: Not assessed Transfers Transfers: Sit to Stand;Stand to Sit Sit to Stand: 4: Min assist;From chair/3-in-1;With armrests;With upper extremity assist Stand to Sit: 4: Min assist;To chair/3-in-1;With armrests;With upper extremity assist Ambulation/Gait Ambulation/Gait Assistance: 4: Min assist Ambulation Distance (Feet): 75 Feet Assistive device: None Ambulation/Gait Assistance Details: Pt. with difficulty in left swing phase (slow and with increased effort) but able to manage herself. Gait Pattern: Step-through pattern;Decreased stance time - left;Decreased step length - left;Decreased hip/knee flexion - left;Decreased dorsiflexion - left;Decreased weight shift to left Gait velocity: slow Stairs: No    ADL: ADL Eating/Feeding: Performed;Modified independent (using RUE) Where Assessed - Eating/Feeding: Chair Grooming: Performed;Brushing hair;Min guard (using RUE) Where Assessed -  Grooming: Supported standing Upper Body Bathing: Simulated;Minimal  assistance Where Assessed - Upper Body Bathing: Unsupported sitting Lower Body Bathing: Simulated;Minimal assistance Where Assessed - Lower Body Bathing: Supported sit to stand Upper Body Dressing: Simulated;Minimal assistance Where Assessed - Upper Body Dressing: Unsupported sitting Lower Body Dressing: Performed;Minimal assistance (don/doff socks) Where Assessed - Lower Body Dressing: Supported sit to stand Toilet Transfer: Mining engineer Method: Sit to Barista:  (chair) Equipment Used: Gait belt Transfers/Ambulation Related to ADLs: min assist for balance ADL Comments: Pt with decreased independence due to L UE/LE weakness. Educated pt on performing LUE AROM and AAROM (using right hand to hold left wrist).  Pt attempting to use iPAD on OT arrival.  Encouraged pt to try to use Left hand when navigating/playing games on iPad.    Cognition: Cognition Overall Cognitive Status: Appears within functional limits for tasks assessed Arousal/Alertness: Awake/alert Orientation Level: Oriented X4 Attention: Alternating Alternating Attention: Appears intact Memory: Appears intact Awareness: Appears intact Problem Solving: Appears intact Executive Function: Reasoning Reasoning: Appears intact Safety/Judgment: Appears intact Cognition Overall Cognitive Status: Appears within functional limits for tasks assessed/performed Arousal/Alertness: Awake/alert Orientation Level: Oriented X4 / Intact Behavior During Session: WFL for tasks performed Cognition - Other Comments: appears somewhat stressed and " on edge"  Blood pressure 157/64, pulse 62, temperature 97.7 F (36.5 C), temperature source Oral, resp. rate 15, height 5\' 1"  (1.549 m), SpO2 97.00%. Physical Exam  Vitals reviewed. Constitutional: She is oriented to person, place, and time.  HENT:  Head: Normocephalic.   Eyes: EOM are normal.  Neck: Neck supple. No thyromegaly present.  Cardiovascular: Normal rate and regular rhythm.   Pulmonary/Chest: Effort normal and breath sounds normal. No respiratory distress. She has no wheezes.  Abdominal: Soft. Bowel sounds are normal. She exhibits no distension.  Neurological: She is alert and oriented to person, place, and time.  Follows three-step commands. Mild dysarthria and left facial weakness. LUE is 2+ proximally to 3- distally. LLE is 3- proximally to 4/5 distally. Subtle LT sensory loss left arm and leg. No visual spatial deficits. Reasonable insight and awareness.   Skin: Skin is warm and dry.  Psychiatric: She has a normal mood and affect.    No results found for this or any previous visit (from the past 24 hour(s)). Ct Head Wo Contrast  11/07/2012  *RADIOLOGY REPORT*  Clinical Data: Weakness and dizziness.  Breast cancer. Hypertension.  CT HEAD WITHOUT CONTRAST  Technique:  Contiguous axial images were obtained from the base of the skull through the vertex without contrast.  Comparison: 11/05/2012  Findings: Questionable small chronic linear lacunar infarct in the left cerebellum, image 6 of series 2.  Nonspecific 8 mm hypodensity in the right upper pons, image 8 of series 2.  Thalami intact. Basal ganglia intact.  The left parietal and colostomy catheter is present extending to the left lateral ventricle and terminating in the vicinity of the corpus callosum.  Patchy white matter hypodensities favor chronic ischemic microvascular white matter disease.  No intracranial hemorrhages observed.  No hydrocephalus.  IMPRESSION:  1.  8 mm hypodense lesion in the right upper pons.  This is nonspecific and only visible on this single image - although quite likely a chronic lacunar infarct, I cannot exclude a subacute lesion or small mass. 2.  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 3.  Patchy white matter hypodensities favor chronic ischemic  microvascular white matter disease. 4.  Left parietal ventriculostomy catheter positioning stable, tip adjacent to the corpus callosum.  No  hydrocephalus.   Original Report Authenticated By: Gaylyn Rong, M.D.    Ct Head W Wo Contrast  11/08/2012  *RADIOLOGY REPORT*  Clinical Data: Weakness and dizziness.  Breast cancer.  Abnormal CT scan.  CT HEAD WITHOUT AND WITH CONTRAST  Technique:  Contiguous axial images were obtained from the base of the skull through the vertex without and with intravenous contrast.  Contrast: 80mL OMNIPAQUE IOHEXOL 300 MG/ML  SOLN  Comparison: CT head without contrast 11/07/2012.  Findings: Extensive white matter changes are again noted.  The postcontrast images demonstrate no pathologic enhancement. Asymmetric white matter hypoattenuation in the right pons is confirmed.  There is asymmetric white matter disease in the left cerebellum as well.  A left parietal ventriculostomy shunt is in place.  The tip of the shunt is at the anterior genu of the corpus callosum.  The ventricles are normal size.  No significant extra-axial fluid collection is present.  IMPRESSION:  1.  Diffuse white matter disease is stable. 2.  White matter changes in the right pons and left cerebellum are again noted.  These are nonspecific. 3.  Left parietal shunt catheter. 4.  No acute intracranial abnormality or significant interval change.  Of note, most ventriculostomy shunt catheters are safe within the MRI environment, but the exact make and model is needed to determine if any reprogramming is necessary.   Original Report Authenticated By: Marin Roberts, M.D.     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 Does the need for close, 24 hr/day medical supervision in concert with the patient's rehab needs make it unreasonable for this patient to be served in a less intensive setting? Yes 2. Co-Morbidities requiring supervision/potential complications: htn,  asthma 3. Due to bladder management, bowel management, safety, skin/wound care, disease management, medication administration and patient education, does the patient require 24 hr/day rehab nursing? Yes 4. Does the patient require coordinated care of a physician, rehab nurse, PT (1-2 hrs/day, 5 days/week), OT (1-2 hrs/day, 5 days/week) and SLP (1 hrs/day, 5 days/week) to address physical and functional deficits in the context of the above medical diagnosis(es)? Yes Addressing deficits in the following areas: balance, endurance, locomotion, strength, transferring, bowel/bladder control, bathing, dressing, feeding, grooming and psychosocial support 5. Can the patient actively participate in an intensive therapy program of at least 3 hrs of therapy per day at least 5 days per week? Yes 6. The potential for patient to make measurable gains while on inpatient rehab is excellent 7. Anticipated functional outcomes upon discharge from inpatient rehab are supervision to mod I with PT, supervision to mod I with OT, mod I with SLP. 8. Estimated rehab length of stay to reach the above functional goals is: 7-10 days 9. Does the patient have adequate social supports to accommodate these discharge functional goals? Yes 10. Anticipated D/C setting: Home 11. Anticipated post D/C treatments: HH therapy 12. Overall Rehab/Functional Prognosis: excellent  RECOMMENDATIONS: This patient's condition is appropriate for continued rehabilitative care in the following setting: CIR Patient has agreed to participate in recommended program. Yes Note that insurance prior authorization may be required for reimbursement for recommended care.  Comment:Rehab RN to follow up.   Ranelle Oyster, MD, [place] [facility]     11/09/2012

## 2012-11-10 ENCOUNTER — Inpatient Hospital Stay (HOSPITAL_COMMUNITY): Payer: Medicaid Other

## 2012-11-10 ENCOUNTER — Inpatient Hospital Stay (HOSPITAL_COMMUNITY): Payer: Medicaid Other | Admitting: Occupational Therapy

## 2012-11-10 DIAGNOSIS — G8110 Spastic hemiplegia affecting unspecified side: Secondary | ICD-10-CM

## 2012-11-10 DIAGNOSIS — I6330 Cerebral infarction due to thrombosis of unspecified cerebral artery: Secondary | ICD-10-CM

## 2012-11-10 DIAGNOSIS — I639 Cerebral infarction, unspecified: Secondary | ICD-10-CM

## 2012-11-10 LAB — COMPREHENSIVE METABOLIC PANEL
Albumin: 3 g/dL — ABNORMAL LOW (ref 3.5–5.2)
Alkaline Phosphatase: 64 U/L (ref 39–117)
BUN: 17 mg/dL (ref 6–23)
Calcium: 8.7 mg/dL (ref 8.4–10.5)
Creatinine, Ser: 0.98 mg/dL (ref 0.50–1.10)
GFR calc Af Amer: 74 mL/min — ABNORMAL LOW (ref >90)
Glucose, Bld: 91 mg/dL (ref 70–99)
Potassium: 4 mEq/L (ref 3.5–5.1)
Total Protein: 6.2 g/dL (ref 6.0–8.3)

## 2012-11-10 LAB — CBC WITH DIFFERENTIAL/PLATELET
Basophils Relative: 1 % (ref 0–1)
Eosinophils Absolute: 0.4 10*3/uL (ref 0.0–0.7)
HCT: 37.7 % (ref 36.0–46.0)
Lymphocytes Relative: 22 % (ref 12–46)
Neutro Abs: 5 10*3/uL (ref 1.7–7.7)
Platelets: 310 10*3/uL (ref 150–400)
RDW: 13.6 % (ref 11.5–15.5)
WBC: 7.7 10*3/uL (ref 4.0–10.5)

## 2012-11-10 NOTE — Care Management Note (Signed)
[facility] Individual Statement of Services  Patient Name:  Desiree Mitchell  Date:  11/10/2012  Welcome to the [facility].  Our goal is to provide you with an individualized program based on your diagnosis and situation, designed to meet your specific needs.  With this comprehensive rehabilitation program, you will be expected to participate in at least 3 hours of rehabilitation therapies Monday-Friday, with modified therapy programming on the weekends.  Your rehabilitation program will include the following services:  Physical Therapy (PT), Occupational Therapy (OT), Speech Therapy (ST), 24 hour per day rehabilitation nursing, Therapeutic Recreaction (TR), Neuropsychology, Case Management ( Social Worker), Rehabilitation Medicine, Nutrition Services and Pharmacy Services  Weekly team conferences will be held on Wednesday to discuss your progress.  Your Social Worker will talk with you frequently to get your input and to update you on team discussions.  Team conferences with you and your family in attendance may also be held.  Expected length of stay: 7 days Overall anticipated outcome: supervision/mod/i level  Depending on your progress and recovery, your program may change. Your Social Worker will coordinate services and will keep you informed of any changes. Your Child psychotherapist names and contact numbers are listed  below.  The following services may also be recommended but are not provided by the [facility]:   Driving Evaluations  Home Health Rehabiltiation Services  Outpatient Rehabilitatation Servives    Arrangements will be made to provide these services after discharge if needed.  Arrangements include referral to agencies that provide these services.  Your insurance has been verified to be:  Medicaid Your primary doctor is:  None  Pertinent information will be shared with your doctor and your insurance company.  Social  Worker:  Dossie Der, [place] [phone]  Information discussed with and copy given to patient by: Lucy Chris, 11/10/2012, 10:51 AM

## 2012-11-10 NOTE — Progress Notes (Signed)
Noni Stonesifer, PTA [phone] 11/10/2012  

## 2012-11-10 NOTE — Evaluation (Signed)
Physical Therapy Assessment and Plan  Patient Details  Name: Desiree Mitchell MRN: [id number] Date of Birth: 1956-06-17  PT Diagnosis: Abnormality of gait, Hemiparesis non-dominant, Impaired sensation and Muscle weakness Rehab Potential: Good ELOS: 7 - 10 days   Today's Date: 11/10/2012 Time: 9:00 - 10:00 Time Calculation (min): 60 minutes  Problem List:  Patient Active Problem List  Diagnosis  . Weakness of left side of body  . HTN (hypertension)  . H/O cerebral aneurysm repair  . Obesity  . Asthma  . CVA (cerebral vascular accident)    Past Medical History:  Past Medical History  Diagnosis Date  . Hypertension   . Breast cancer   . Hydrocephalus   . Asthma    Past Surgical History:  Past Surgical History  Procedure Laterality Date  . Brain surgery      stent in head    Assessment & Plan Clinical Impression: Desiree Mitchell is a 57 y.o. right-handed female with history of breast cancer(status post bilateral mastectomy chemotherapy, currently in remission and not receiving any treatment), hypertension, hydrocephalus(cerebral aneurysm repair) with VP shunt placement x10 years. Admitted 11/05/2012 oh left-sided weakness. Cranial CT scan with diffuse white matter disease, left parietal ventriculostomy shunt in place. No acute intracranial abnormality noted. MRI of the brain completed a history of VP shunt. Patient did not receive TPA. Echocardiogram with ejection fraction of 65% grade 1 diastolic dysfunction. Venous Doppler studies of lower extremities negative for DVT. Carotid Dopplers with no ICA stenosis. Neurology services consulted presently on aspirin therapy for stroke prophylaxis as well as subcutaneous Lovenox. Physical and occupational therapy evaluations completed with recommendations of physical medicine rehabilitation consult to consider inpatient rehabilitation services. Patient was felt to be a candidate for inpatient rehabilitation services and was admitted for a  comprehensive rehabilitation program  on 11/09/2012 .   Patient currently requires supervision to min with mobility secondary to muscle weakness and left hemiparesis.  Prior to hospitalization, patient was independent  with mobility and lived with Friend(s) in a House home. Patient's home has a 1Ramped entrance. Friend's (Diane's) home has 2 steps to enter with no railings.  Patient will benefit from skilled PT intervention to maximize safe functional mobility and minimize fall risk for planned discharge home with 24 hour supervision.  Anticipate patient will benefit from follow up HH at discharge.  PT Assessment Rehab Potential: Good Barriers to Discharge: None  Skilled Therapeutic Intervention Patient participated in evaluation then treatment began. Patient ambulated without assistive device 200 feet with occasional min assist for balance. Patient's left LE tends to get weaker throughout walk. Patient would occasionally reach out for railing in hallway to assist with balance. BERG performed and patient scored 51/56. Patient ambulated up and down 10 steps with 1 railing on right and min assist and cueing for sequencing. Patient ambulated 200 feet pushing wheelchair with close supervision. Patient left in wheelchair with all items in reach.  PT Evaluation Precautions/Restrictions Fall risk Pain Pain Assessment Pain Assessment: No/denies pain Home Living/Prior Functioning Home Living Lives With: Friend(s) Available Help at Discharge: Family;Friend(s) Type of Home: House Prior Function Vocation: On disability Cognition Overall Cognitive Status: Appears within functional limits for tasks assessed Arousal/Alertness: Awake/alert Orientation Level: Oriented X4 Attention: Divided Alternating Attention: Appears intact Divided Attention: Appears intact Memory: Appears intact Awareness: Appears intact Problem Solving: Appears intact Executive Function:  Reasoning;Sequencing;Organizing;Decision Making;Initiating;Self Monitoring;Self Correcting Reasoning: Appears intact Sequencing: Appears intact Organizing: Appears intact Decision Making: Appears intact Initiating: Appears intact Self Monitoring: Appears intact Self Correcting:  Appears intact Safety/Judgment: Appears intact Sensation Sensation Light Touch: Impaired by gross assessment (pt reports left extremities do not feel the same) Stereognosis: Not tested Hot/Cold: Not tested Proprioception: Appears Intact Coordination Gross Motor Movements are Fluid and Coordinated: No Fine Motor Movements are Fluid and Coordinated: No Heel Shin Test: patient unable to perform fully with left LE Motor  Motor Motor: Hemiplegia  Mobility Transfers Sit to Stand: 5: Supervision;Without upper extremity assist;From bed Stand to Sit: 5: Supervision;With upper extremity assist;With armrests Locomotion  Ambulation Ambulation: Yes Ambulation/Gait Assistance: 4: Min guard Ambulation Distance (Feet): 200 Feet Assistive device: None Gait Gait: Yes Gait Pattern: Step-through pattern;Decreased weight shift to left;Decreased hip/knee flexion - left Gait velocity: decreased Stairs / Additional Locomotion Stairs: Yes Stairs Assistance: 4: Min guard Stair Management Technique: One rail Right;Step to pattern;Forwards Number of Stairs: 10 Height of Stairs: 6    Balance See BERG - 51/56 Extremity Assessment      RLE Assessment RLE Assessment: Within Functional Limits (grossly 4/5 throughout) LLE Assessment LLE Assessment: Exceptions to [facility] LLE Strength LLE Overall Strength: Deficits LLE Overall Strength Comments: 3-/5 throughout  FIM:  FIM - Locomotion: Ambulation Ambulation/Gait Assistance: 4: Min guard   Refer to Care Plan for Long Term Goals  Recommendations for other services: None  Discharge Criteria: Patient will be discharged from PT if patient refuses treatment 3 consecutive  times without medical reason, if treatment goals not met, if there is a change in medical status, if patient makes no progress towards goals or if patient is discharged from hospital.  The above assessment, treatment plan, treatment alternatives and goals were discussed and mutually agreed upon: by patient  [name] 11/10/2012, 4:34 PM

## 2012-11-10 NOTE — Evaluation (Signed)
Occupational Therapy Assessment and Plan  Patient Details  Name: Desiree Mitchell MRN: [id number] Date of Birth: 10/18/1955  OT Diagnosis: hemiplegia affecting non-dominant side Rehab Potential: Rehab Potential: Excellent ELOS: 5-7 days   Today's Date: 11/10/2012 Time: 1308-6578 and 4696-2952 Time Calculation (min): 60 min and 35 min  Problem List:  Patient Active Problem List  Diagnosis  . Weakness of left side of body  . HTN (hypertension)  . H/O cerebral aneurysm repair  . Obesity  . Asthma  . CVA (cerebral vascular accident)    Past Medical History:  Past Medical History  Diagnosis Date  . Hypertension   . Breast cancer   . Hydrocephalus   . Asthma    Past Surgical History:  Past Surgical History  Procedure Laterality Date  . Brain surgery      stent in head    Assessment & Plan Clinical Impression: Desiree Mitchell is a 57 y.o. right-handed female with history of breast cancer(status post bilateral mastectomy chemotherapy, currently in remission and not receiving any treatment), hypertension, hydrocephalus(cerebral aneurysm repair) with VP shunt placement x10 years. Admitted 11/05/2012 [facility] left-sided weakness. Cranial CT scan with diffuse white matter disease, left parietal ventriculostomy shunt in place. No acute intracranial abnormality noted. MRI of the brain completed a history of VP shunt. Patient did not receive TPA. Echocardiogram with ejection fraction of 65% grade 1 diastolic dysfunction. Venous Doppler studies of lower extremities negative for DVT. Carotid Dopplers with no ICA stenosis. Neurology services consulted presently on aspirin therapy for stroke prophylaxis as well as subcutaneous Lovenox. Physical and occupational therapy evaluations completed with recommendations of physical medicine rehabilitation consult to consider inpatient rehabilitation services. Patient was felt to be a candidate for inpatient rehabilitation services and was admitted for a comprehensive  rehabilitation program  Patient transferred to [facility] on 11/09/2012 .    Patient currently requires supervision with basic self-care skills secondary to muscle weakness, decreased coordination and decreased standing balance and hemiplegia.  Prior to hospitalization, patient was independent with BADLS/IADL and was driving. Patient will benefit from skilled intervention to increase independence with basic self-care skills and increase level of independence with iADL prior to discharge home with care partner.  Anticipate patient will require intermittent supervision and follow up outpatient.  OT - End of Session Activity Tolerance: Tolerates 30+ min activity without fatigue;Endurance does not limit participation in activity OT Assessment Rehab Potential: Excellent Barriers to Discharge: None OT Plan OT Intensity: Minimum of 1-2 x/day, 45 to 90 minutes OT Frequency: 5 out of 7 days OT Duration/Estimated Length of Stay: 5-7 days OT Treatment/Interventions: Balance/vestibular training;Discharge planning;DME/adaptive equipment instruction;Functional mobility training;Neuromuscular re-education;Patient/family education;Self Care/advanced ADL retraining;Therapeutic Activities;UE/LE Strength taining/ROM;Therapeutic Exercise;UE/LE Coordination activities OT Recommendation Patient destination: Home Follow Up Recommendations: Outpatient OT Equipment Recommended: Tub/shower seat   Skilled Therapeutic Intervention Visit 1: No C/o pain. Pt seen for initial evaluation and ADL retraining. Pt opted to bathe at sink level. She participated very well this am demonstrating good initiation to use her LUE as much as possible. Despite her weak grasp she was able to pull her socks on without assist.  She tends to be hyperverbal but she was able to remain on task with good attention.  Pt stood for the majority of the session with supervision to complete self care. Discussed safety with patient that even though she is moving  well, she needs to always call for assistance until her balance improves. Discussed goals and POC. PT had arrived for patient's next session.  Visit 2:  Pt seen this session to work activity tolerance, balance, and LUE AROM and functional use. Pt ambulated to and from the gym with supervision. Pt moves at a slow but steady pace.  She prefers to hold her arms crossed to reduce pain from UE edema.  Pt worked on LUE A/AROM with 1 lb dowel bar and holding beach ball to facilitate grasp and shoulder ROM.  She also did towel slides with forward reaching for self ROM.  In room she was able to open soda can and sugar packets.  Pt left in room in recliner with call light in reach.  OT Evaluation Precautions/Restrictions  Precautions Precautions: Fall Restrictions Weight Bearing Restrictions: No   Pain Pain Assessment Pain Assessment: No/denies pain Pain Score: Asleep Faces Pain Scale: No hurt Home Living/Prior Functioning Home Living Lives With: Friend(s) Available Help at Discharge: Available 24 hours/day Type of Home: House Home Access: Stairs to enter [employer] of Steps: 1 Entrance Stairs-Rails: None Home Layout: One level Bathroom Shower/Tub: Forensic scientist: Standard How Accessible: Accessible via walker Home Adaptive Equipment: Bedside commode/3-in-1;Walker - rolling Prior Function Level of Independence: Independent with basic ADLs;Independent with homemaking with ambulation;Independent with gait;Independent with transfers Able to Take Stairs?: Yes Driving: Yes Vocation: On disability Leisure: Hobbies-yes (Comment) Comments: flea market shopping, crafting, crocheting ADL   supervision level with BADL Vision/Perception  Vision - History Baseline Vision: No visual deficits Patient Visual Report: No change from baseline Vision - Assessment Eye Alignment: Within Functional Limits Perception Perception: Within Functional Limits Praxis Praxis:  Intact  Cognition Overall Cognitive Status: Appears within functional limits for tasks assessed Sensation Sensation Light Touch: Appears Intact Stereognosis: Appears Intact Hot/Cold: Appears Intact Proprioception: Appears Intact Coordination Gross Motor Movements are Fluid and Coordinated: No (limited by decreased strength) Fine Motor Movements are Fluid and Coordinated: No (limited in left hand due to weakness) Motor  Motor Motor: Hemiplegia Motor - Skilled Clinical Observations: LUE/LLE 3/5 strength Mobility     Trunk/Postural Assessment  Cervical Assessment Cervical Assessment: Within Functional Limits Thoracic Assessment Thoracic Assessment: Within Functional Limits Lumbar Assessment Lumbar Assessment: Within Functional Limits Postural Control Postural Control: Within Functional Limits  Balance Static Sitting Balance Static Sitting - Level of Assistance: 7: Independent Dynamic Sitting Balance Dynamic Sitting - Level of Assistance: 7: Independent Static Standing Balance Static Standing - Level of Assistance: 5: Stand by assistance Dynamic Standing Balance Dynamic Standing - Level of Assistance: 5: Stand by assistance Extremity/Trunk Assessment RUE Assessment RUE Assessment: Within Functional Limits LUE Assessment LUE Assessment: Exceptions to [facility] LUE AROM (degrees) Left Shoulder Flexion: 30 Degrees Left Elbow Flexion: 45 LUE Strength Gross Grasp: Impaired (3/5 strength)  FIM:  FIM - Grooming Grooming Steps: Wash, rinse, dry face;Wash, rinse, dry hands;Oral care, brush teeth, clean dentures;Brush, comb hair Grooming: 5: Supervision: safety issues or verbal cues FIM - Bathing Bathing Steps Patient Completed: Chest;Right Arm;Left Arm;Abdomen;Left upper leg;Right upper leg;Buttocks;Front perineal area;Right lower leg (including foot);Left lower leg (including foot) Bathing: 5: Supervision: Safety issues/verbal cues FIM - Upper Body Dressing/Undressing Upper body  dressing/undressing: 0: Wears gown/pajamas-no public clothing FIM - Lower Body Dressing/Undressing Lower body dressing/undressing steps patient completed: Thread/unthread right underwear leg;Thread/unthread left underwear leg;Pull underwear up/down;Thread/unthread right pants leg;Thread/unthread left pants leg;Pull pants up/down;Don/Doff right sock;Don/Doff left sock Lower body dressing/undressing: 5: Supervision: Safety issues/verbal cues FIM - Toileting Toileting steps completed by patient: Adjust clothing prior to toileting;Performs perineal hygiene;Adjust clothing after toileting Toileting: 5: Supervision: Safety issues/verbal cues FIM - Bed/Chair Transfer Bed/Chair Transfer: 7:  Supine > Sit: No assist;5: Bed > Chair or W/C: Supervision (verbal cues/safety issues) FIM - Diplomatic Services operational officer Devices: Grab bars Toilet Transfers: 5-To toilet/BSC: Supervision (verbal cues/safety issues);5-From toilet/BSC: Supervision (verbal cues/safety issues) FIM - Secretary/administrator Devices: Shower chair;Grab bars Tub/shower Transfers: 5-Into Tub/Shower: Supervision (verbal cues/safety issues);5-Out of Tub/Shower: Supervision (verbal cues/safety issues)   Refer to Care Plan for Long Term Goals  Recommendations for other services: None  Discharge Criteria: Patient will be discharged from OT if patient refuses treatment 3 consecutive times without medical reason, if treatment goals not met, if there is a change in medical status, if patient makes no progress towards goals or if patient is discharged from hospital.  The above assessment, treatment plan, treatment alternatives and goals were discussed and mutually agreed upon: by patient  [facility] 11/10/2012, 9:14 AM

## 2012-11-10 NOTE — Progress Notes (Signed)
Social Work Assessment and Plan Social Work Assessment and Plan  Patient Details  Name: Desiree Mitchell MRN: [id number] Date of Birth: 1956/03/03  Today's Date: 11/10/2012  Problem List:  Patient Active Problem List  Diagnosis  . Weakness of left side of body  . HTN (hypertension)  . H/O cerebral aneurysm repair  . Obesity  . Asthma  . CVA (cerebral vascular accident)   Past Medical History:  Past Medical History  Diagnosis Date  . Hypertension   . Breast cancer   . Hydrocephalus   . Asthma    Past Surgical History:  Past Surgical History  Procedure Laterality Date  . Brain surgery      stent in head   Social History:  reports that she has never smoked. She does not have any smokeless tobacco history on file. She reports that  drinks alcohol. Her drug history is not on file.  Family / Support Systems Marital Status: Widow/Widower How Long?: 2 years Patient Roles: Parent Children: Samara Deist Newcomb-daughter  [phone] Other Supports: Dianna Murdock-friend [phone] Anticipated Caregiver: Dianna Ability/Limitations of Caregiver: can provide assist in good shape and can provide pyshcial care if needed Caregiver Availability: 24/7 Family Dynamics: Close with her children, Dianna is like a sister to her and has been through all of her health issues with her.  She can count on her, while her children are busy with their children and own lives.  Social History Preferred language: English Religion: Unknown Cultural Background: No issues Education: High School Read: Yes Write: Yes Employment Status: Disabled Fish farm manager Issues: No issues Guardian/Conservator: None-according to MD pt is capable of making her own decisions    Abuse/Neglect Physical Abuse: Denies Verbal Abuse: Denies Sexual Abuse: Denies Exploitation of patient/patient's resources: Denies Self-Neglect: Denies  Emotional Status Pt's affect, behavior adn adjustment status: Pt  is motivated and wants to do well here.  She is please dwith the movement she has in her hand just needs to be able to direct it.  She has always been able to over come her health issues and plans to again. Recent Psychosocial Issues: Other medical issues has been thorugh a lot in the last few years. Pyschiatric History: No history-depression screen score-2 she feels she is doing fairly well with her condition and coping.  She takes each day at a time and relies upon her failth to get her through. Substance Abuse History: No issues  Patient / Family Perceptions, Expectations & Goals Pt/Family understanding of illness & functional limitations: Pt is able to explain her stroke and deficits and is encouraged by the progress she has made already.  She is hopeful she will do well here. Premorbid pt/family roles/activities: Mother, Grandmother, Retiree, [employer], Friend, etc Anticipated changes in roles/activities/participation: resume Pt/family expectations/goals: Pt states: " I want to do for myself, I do not want to bother others."  Pt has always been independent and wants to remain this way.  Community Resources [employer]: None Premorbid Home Care/DME Agencies: None Transportation available at discharge: Friend Resource referrals recommended: Support group (specify) (CVA Support group)  Discharge Planning Living Arrangements: Non-relatives/Friends Support Systems: Children;Other relatives;Friends/neighbors;Church/faith community Type of Residence: Private residence Insurance Resources: Medicaid (specify county) Medical sales representative) Financial Resources: SSD Financial Screen Referred: No Living Expenses: Other (Comment) (Friends home) Money Management: Patient Do you have any problems obtaining your medications?: No Home Management: Both she and Dianna Patient/Family Preliminary Plans: Return to Dianna's home where she can provide care if necessary and/or be there.  Dianna will be here  later and will address questions. Social Work Anticipated Follow Up Needs: Support Group;HH/OP  Clinical Impression Pleasant talkative female who is motivated to improve and get as independent as possible.  Dianna is supportive and committed to her and will assist if necessary. Children are supportive but their seems to be some issue with them at this time.  Lucy Chris 11/10/2012, 10:49 AM

## 2012-11-10 NOTE — Evaluation (Signed)
Speech Language Pathology Assessment and Plan  Patient Details  Name: Desiree Mitchell MRN: [id number] Date of Birth: 19-Jan-1956   Today's Date: 11/10/2012 Time: 4098-1191 Time Calculation (min): 51 min  Problem List:  Patient Active Problem List  Diagnosis  . Weakness of left side of body  . HTN (hypertension)  . H/O cerebral aneurysm repair  . Obesity  . Asthma  . CVA (cerebral vascular accident)   Past Medical History:  Past Medical History  Diagnosis Date  . Hypertension   . Breast cancer   . Hydrocephalus   . Asthma    Past Surgical History:  Past Surgical History  Procedure Laterality Date  . Brain surgery      stent in head    Assessment / Plan / Recommendation Clinical Impression  Desiree Mitchell is a 57 y.o. right-handed female with history of breast cancer(status post bilateral mastectomy chemotherapy, currently in remission and not receiving any treatment), hypertension, hydrocephalus(cerebral aneurysm repair) with VP shunt placement x10 years. Admitted 11/05/2012 [facility] left-sided weakness. Cranial CT scan with diffuse white matter disease, left parietal ventriculostomy shunt in place. No acute intracranial abnormality noted. MRI of the brain completed a history of VP shunt. Pt also found to have white matter changes in the pons and left cerebellum. Patient did not receive TPA. Physical and occupational therapy evaluations completed with recommendations of physical medicine rehabilitation consult to consider inpatient rehabilitation services.  Acute SLP evaluation revealed no acute cognitive linguistic changes. Patient was felt to be a candidate for inpatient rehabilitation services and was admitted for a comprehensive rehabilitation program.   After thorough assessment of higher level cognitive linguistic tasks, pt continues to demonstrate function WNL. Pt demonstrates capability of managing cognitive aspect of ADL's (memory, reasoning, finances, safety, etc). She does  demonstrate mildly tangential verbal expression and also struggles with complex abstract tasks, prefering to refer to topics and experiences familar to her. This is likely pts baseline function, which was confirmed by her close friend and roommate via phone. SLP also completed skilled observation of pt with PO intake given possible pontine involvement. Pt with no evidence of aspiration or dysphagia. She is recommended to continue a regular diet and thin liquids. No skilled SLP f/u in inpatient rehab warranted. SLP will sign off.     SLP Assessment  Patient does not need any further Speech Lanaguage Pathology Services    Recommendations  Diet Recommendations: Regular;Thin liquid Liquid Administration via: Cup;Straw Medication Administration: Whole meds with liquid Supervision: Patient able to self feed Oral Care Recommendations: Patient independent with oral care Follow up Recommendations: None    SLP Frequency     SLP Treatment/Interventions      Pain Pain Assessment Pain Assessment: No/denies pain Prior Functioning Cognitive/Linguistic Baseline: Within functional limits Type of Home: House Lives With: Friend(s) Available Help at Discharge: Family;Friend(s) Education: high school graduate Vocation: On disability  Short Term Goals: No short term goals set  See FIM for current functional status Refer to Care Plan for Long Term Goals  Recommendations for other services: None  Discharge Criteria: Patient will be discharged from SLP if patient refuses treatment 3 consecutive times without medical reason, if treatment goals not met, if there is a change in medical status, if patient makes no progress towards goals or if patient is discharged from hospital.  The above assessment, treatment plan, treatment alternatives and goals were discussed and mutually agreed upon: by patient  Desiree Mitchell, [name] 11/10/2012, 3:07 PM

## 2012-11-10 NOTE — Progress Notes (Signed)
Patient ID: Desiree Mitchell, female   DOB: [birth year], 57 y.o.   MRN: [id number] Subjective/Complaints: 57 y.o. right-handed female with history of breast cancer(status post bilateral mastectomy chemotherapy, currently in remission and not receiving any treatment), hypertension, hydrocephalus(cerebral aneurysm repair) with VP shunt placement x10 years. Admitted 11/05/2012 oh left-sided weakness. Cranial CT scan with diffuse white matter disease, left parietal ventriculostomy shunt in place. No acute intracranial abnormality noted. MRI of the brain completed a history of VP shunt. Patient did not receive TPA. Echocardiogram with ejection fraction of 65% grade 1 diastolic dysfunction. Venous Doppler studies of lower extremities negative for DVT. Carotid Dopplers with no ICA stenosis. Neurology services consulted presently on aspirin therapy for stroke prophylaxis as well as subcutaneous Lovenox. No problems last noc still feel ing weak on Left side Review of Systems  Eyes: Negative for blurred vision and double vision.  Neurological: Positive for sensory change and focal weakness. Negative for speech change and seizures.  All other systems reviewed and are negative.    Objective: Vital Signs: Blood pressure 153/64, pulse 65, temperature 97.8 F (36.6 C), temperature source Oral, resp. rate 18, height 5\' 1"  (1.549 m), weight 94.348 kg (208 lb), SpO2 97.00%. Ct Head W Wo Contrast  11/08/2012  *RADIOLOGY REPORT*  Clinical Data: Weakness and dizziness.  Breast cancer.  Abnormal CT scan.  CT HEAD WITHOUT AND WITH CONTRAST  Technique:  Contiguous axial images were obtained from the base of the skull through the vertex without and with intravenous contrast.  Contrast: 80mL OMNIPAQUE IOHEXOL 300 MG/ML  SOLN  Comparison: CT head without contrast 11/07/2012.  Findings: Extensive white matter changes are again noted.  The postcontrast images demonstrate no pathologic enhancement. Asymmetric white matter  hypoattenuation in the right pons is confirmed.  There is asymmetric white matter disease in the left cerebellum as well.  A left parietal ventriculostomy shunt is in place.  The tip of the shunt is at the anterior genu of the corpus callosum.  The ventricles are normal size.  No significant extra-axial fluid collection is present.  IMPRESSION:  1.  Diffuse white matter disease is stable. 2.  White matter changes in the right pons and left cerebellum are again noted.  These are nonspecific. 3.  Left parietal shunt catheter. 4.  No acute intracranial abnormality or significant interval change.  Of note, most ventriculostomy shunt catheters are safe within the MRI environment, but the exact make and model is needed to determine if any reprogramming is necessary.   Original Report Authenticated By: Marin Roberts, M.D.    Results for orders placed during the hospital encounter of 11/09/12 (from the past 72 hour(s))  CBC     Status: None   Collection Time    11/09/12  4:30 PM      Result Value Range   WBC 9.3  4.0 - 10.5 K/uL   RBC 3.88  3.87 - 5.11 MIL/uL   Hemoglobin 12.3  12.0 - 15.0 g/dL   HCT 13.0  86.5 - 78.4 %   MCV 94.8  78.0 - 100.0 fL   MCH 31.7  26.0 - 34.0 pg   MCHC 33.4  30.0 - 36.0 g/dL   RDW 69.6  29.5 - 28.4 %   Platelets 334  150 - 400 K/uL  CREATININE, SERUM     Status: Abnormal   Collection Time    11/09/12  4:30 PM      Result Value Range   Creatinine, Ser 0.99  0.50 - 1.10 mg/dL  GFR calc non Af Amer 63 (*) >90 mL/min   GFR calc Af Amer 73 (*) >90 mL/min   Comment:            The eGFR has been calculated     using the CKD EPI equation.     This calculation has not been     validated in all clinical     situations.     eGFR's persistently     <90 mL/min signify     possible Chronic Kidney Disease.  CBC WITH DIFFERENTIAL     Status: None   Collection Time    11/10/12  5:52 AM      Result Value Range   WBC 7.7  4.0 - 10.5 K/uL   RBC 3.96  3.87 - 5.11 MIL/uL    Hemoglobin 12.4  12.0 - 15.0 g/dL   HCT 45.4  09.8 - 11.9 %   MCV 95.2  78.0 - 100.0 fL   MCH 31.3  26.0 - 34.0 pg   MCHC 32.9  30.0 - 36.0 g/dL   RDW 14.7  82.9 - 56.2 %   Platelets 310  150 - 400 K/uL   Neutrophils Relative 65  43 - 77 %   Neutro Abs 5.0  1.7 - 7.7 K/uL   Lymphocytes Relative 22  12 - 46 %   Lymphs Abs 1.7  0.7 - 4.0 K/uL   Monocytes Relative 8  3 - 12 %   Monocytes Absolute 0.6  0.1 - 1.0 K/uL   Eosinophils Relative 5  0 - 5 %   Eosinophils Absolute 0.4  0.0 - 0.7 K/uL   Basophils Relative 1  0 - 1 %   Basophils Absolute 0.0  0.0 - 0.1 K/uL  COMPREHENSIVE METABOLIC PANEL     Status: Abnormal   Collection Time    11/10/12  5:52 AM      Result Value Range   Sodium 144  135 - 145 mEq/L   Potassium 4.0  3.5 - 5.1 mEq/L   Chloride 108  96 - 112 mEq/L   CO2 28  19 - 32 mEq/L   Glucose, Bld 91  70 - 99 mg/dL   BUN 17  6 - 23 mg/dL   Creatinine, Ser 1.30  0.50 - 1.10 mg/dL   Calcium 8.7  8.4 - 86.5 mg/dL   Total Protein 6.2  6.0 - 8.3 g/dL   Albumin 3.0 (*) 3.5 - 5.2 g/dL   AST 15  0 - 37 U/L   ALT 12  0 - 35 U/L   Alkaline Phosphatase 64  39 - 117 U/L   Total Bilirubin 0.3  0.3 - 1.2 mg/dL   GFR calc non Af Amer 64 (*) >90 mL/min   GFR calc Af Amer 74 (*) >90 mL/min   Comment:            The eGFR has been calculated     using the CKD EPI equation.     This calculation has not been     validated in all clinical     situations.     eGFR's persistently     <90 mL/min signify     possible Chronic Kidney Disease.     HEENT: normal Cardio: RRR Resp: CTA B/L GI: BS positive Extremity:  Pulses positive and No Edema Skin:   Intact Neuro: Alert/Oriented, Cranial Nerve II-XII normal, Abnormal Sensory reduced pinprick LUE and LLE , Abnormal Motor 2-/5 L delt Bi tri grip  HF 3-/5 Knee ext and ankle DF/PF, Abnormal FMC Ataxic/ dec FMC and Tone  Within Normal Limits, Tone:  Within Normal Limits and Other No aphasia or dysarthria Musc/Skel:  Normal Gen  NAD   Assessment/Plan: 1. Functional deficits secondary to brainstem infarct R pontine which require 3+ hours per day of interdisciplinary therapy in a comprehensive inpatient rehab setting. Physiatrist is providing close team supervision and 24 hour management of active medical problems listed below. Physiatrist and rehab team continue to assess barriers to discharge/monitor patient progress toward functional and medical goals. FIM:             FIM - Banker Devices: Bed rails Bed/Chair Transfer: 7: Supine > Sit: No assist;7: Sit > Supine: No assist;5: Sit > Supine: Supervision (verbal cues/safety issues);5: Bed > Chair or W/C: Supervision (verbal cues/safety issues)     Comprehension Comprehension Mode: Auditory Comprehension: 7-Follows complex conversation/direction: With no assist  Expression Expression Mode: Verbal Expression: 7-Expresses complex ideas: With no assist  Social Interaction Social Interaction: 7-Interacts appropriately with others - No medications needed.  Problem Solving Problem Solving: 7-Solves complex problems: Recognizes & self-corrects  Memory Memory: 7-Complete Independence: No helper  Medical Problem List and Plan:  1. 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 DVT Prophylaxis/Anticoagulation: Subcutaneous Lovenox. Monitor platelet counts any signs of bleeding. Recheck in the am  3. Pain Management: Ultram as needed. Monitor with increased mobility  4. Neuropsych: This patient is capable of making decisions on his/her own behalf.  5. History of VP shunt/hydrocephalus x10 years  6. History of breast cancer status post bilateral mastectomy chemotherapy. Currently in remission and not receiving any treatment.  7. History of asthma. Combivent inhaler as needed. Denies any shortness of breath  8. Hypertension. Toprol-XL 50 mg daily. Monitor with increased mobility and  adjust as indicated  9. Hyperlipidemia. Zocor    LOS (Days) 1 A FACE TO FACE EVALUATION WAS PERFORMED  KIRSTEINS,ANDREW E 11/10/2012, 6:58 AM

## 2012-11-10 NOTE — Plan of Care (Signed)
Overall Plan of Care [facility]) Patient Details Name: Desiree Mitchell MRN: [id number] DOB: 1955-12-16  Diagnosis:  Right CVA  Co-morbidities: Hypertension  .  Breast cancer  .  Hydrocephalus  .  Asthma    Functional Problem List  Patient demonstrates impairments in the following areas: Balance, Edema, Endurance and Motor  Basic ADL's: grooming, bathing, dressing and toileting Advanced ADL's: simple meal preparation, laundry and light housekeeping  Transfers:  bed to chair, toilet, tub/shower, car and furniture Locomotion:  ambulation and stairs  Additional Impairments:  Functional use of upper extremity  Anticipated Outcomes Item Anticipated Outcome  Eating/Swallowing  independent  Basic self-care  Mod I  Tolieting  Mod I  Bowel/Bladder  Mod I  Transfers  Mod I  Locomotion  Mod I with ambulation and stairs  Communication  Independent  Cognition  Independent  Pain  Mod I  Safety/Judgment  Mod I  Other     Therapy Plan: PT Intensity: Minimum of 1-2 x/day ,45 to 90 minutes PT Frequency: 5 out of 7 days PT Duration Estimated Length of Stay: 7 to 10 days OT Intensity: Minimum of 1-2 x/day, 45 to 90 minutes OT Frequency: 5 out of 7 days OT Duration/Estimated Length of Stay: 5-7 days      Team Interventions: Item RN PT OT SLP SW TR Other  Self Care/Advanced ADL Retraining   x      Neuromuscular Re-Education  x x      Therapeutic Activities  x x      UE/LE Strength Training/ROM  x x      UE/LE Coordination Activities   x      Visual/Perceptual Remediation/Compensation         DME/Adaptive Equipment Instruction   x      Therapeutic Exercise  x x      Balance/Vestibular Training x x x      Patient/Family Education x x x      Cognitive Remediation/Compensation         Functional Mobility Training  x x      Ambulation/Gait Training  x       Electronics engineer         Bladder Management         Bowel Management         Disease Management/Prevention x        Pain Management         Medication Management         Skin Care/Wound Management x        Splinting/Orthotics         Discharge Planning  x x      Psychosocial Support x                           Team Discharge Planning: Destination: PT-Home ,OT- Home , SLP-  Projected Follow-up: PT-Home health PT, OT-  Outpatient OT, SLP- None Projected Equipment Needs: PT-Rolling walker with 5" wheels, OT- Tub/shower seat, SLP-  Patient/family involved in discharge planning: PT- Patient,  OT-Patient, SLP-   MD ELOS: 10-14 days Medical Rehab Prognosis:  Good Assessment: 57 year old female with history of breast carcinoma  status post mastectomy partially 1 year ago. Comorbidities include hypertension. Patient requires CIR level PT OT as well as 24 7 rehabilitation RN and M.D. For right brain CVA probable subcortical. CT negative MRI could not be done secondary to shunt Team will focus on mobility, ADLs as well as higher level cognitive tasks    See Team Conference Notes for weekly updates to the plan of care

## 2012-11-10 NOTE — Progress Notes (Signed)
Patient information reviewed and entered into eRehab system by Verlin Duke, RN, CRRN, PPS Coordinator.  Information including medical coding and functional independence measure will be reviewed and updated through discharge.    

## 2012-11-11 ENCOUNTER — Inpatient Hospital Stay (HOSPITAL_COMMUNITY): Payer: Medicaid Other | Admitting: Occupational Therapy

## 2012-11-11 ENCOUNTER — Inpatient Hospital Stay (HOSPITAL_COMMUNITY): Payer: Medicaid Other

## 2012-11-11 NOTE — Progress Notes (Signed)
Social Work Patient ID: Desiree Mitchell, female   DOB: 06-26-1956, 57 y.o.   MRN: [id number] Met with pt to inform team conference goals-mod/i level and discharge 4/5.  She hopes she is ready and does not want to go home to soon. She will discuss with Dianna transportation to OP.  Will continue to monitor her progress and see if reaching goals.  Will discuss with team To make mod/i in room by friday so can build confidence for discharge home.

## 2012-11-11 NOTE — Progress Notes (Signed)
Occupational Therapy Session Note  Patient Details  Name: Desiree Mitchell MRN: [id number] Date of Birth: 11/20/1955  Today's Date: 11/11/2012 Time: 0805-0905 Time Calculation (min): 60 min   Skilled Therapeutic Interventions/Progress Updates:      Pt seen for BADL retraining of  bathing and dressing with a focus on dynamic balance and use of LUE. Pt only required distant supervision with all tasks and used her LUE as a nondominant assist.  Pt ambulated to gym with supervision without AD and worked on finger ladder to increase shoulder AROM.  Pt continues to only have 30 degrees of left shoulder AROM.  Pt then went to physical therapy session.  Therapy Documentation Precautions:  Precautions Precautions: Fall Restrictions Weight Bearing Restrictions: No    Pain: Pain Assessment Pain Assessment: No/denies pain ADL:  See FIM for current functional status  Therapy/Group: Individual Therapy  SAGUIER,JULIA 11/11/2012, 11:19 AM

## 2012-11-11 NOTE — Progress Notes (Signed)
Patient ID: Desiree Mitchell, female   DOB: 1956/08/06, 57 y.o.   MRN: [id number] Subjective/Complaints: 57 y.o. right-handed female with history of breast cancer(status post bilateral mastectomy chemotherapy, currently in remission and not receiving any treatment), hypertension, hydrocephalus(cerebral aneurysm repair) with VP shunt placement x10 years. Admitted 11/05/2012 oh left-sided weakness. Cranial CT scan with diffuse white matter disease, left parietal ventriculostomy shunt in place. No acute intracranial abnormality noted. MRI of the brain completed a history of VP shunt. Patient did not receive TPA. Echocardiogram with ejection fraction of 65% grade 1 diastolic dysfunction. Venous Doppler studies of lower extremities negative for DVT. Carotid Dopplers with no ICA stenosis. Neurology services consulted presently on aspirin therapy for stroke prophylaxis as well as subcutaneous Lovenox. No pain complaints no other issues noted  Review of Systems  Eyes: Negative for blurred vision and double vision.  Neurological: Positive for sensory change and focal weakness. Negative for speech change and seizures.  All other systems reviewed and are negative.    Objective: Vital Signs: Blood pressure 181/75, pulse 66, temperature 97.9 F (36.6 C), temperature source Oral, resp. rate 18, height 5\' 1"  (1.549 m), weight 94.348 kg (208 lb), SpO2 98.00%. No results found. Results for orders placed during the hospital encounter of 11/09/12 (from the past 72 hour(s))  CBC     Status: None   Collection Time    11/09/12  4:30 PM      Result Value Range   WBC 9.3  4.0 - 10.5 K/uL   RBC 3.88  3.87 - 5.11 MIL/uL   Hemoglobin 12.3  12.0 - 15.0 g/dL   HCT 13.0  86.5 - 78.4 %   MCV 94.8  78.0 - 100.0 fL   MCH 31.7  26.0 - 34.0 pg   MCHC 33.4  30.0 - 36.0 g/dL   RDW 69.6  29.5 - 28.4 %   Platelets 334  150 - 400 K/uL  CREATININE, SERUM     Status: Abnormal   Collection Time    11/09/12  4:30 PM      Result  Value Range   Creatinine, Ser 0.99  0.50 - 1.10 mg/dL   GFR calc non Af Amer 63 (*) >90 mL/min   GFR calc Af Amer 73 (*) >90 mL/min   Comment:            The eGFR has been calculated     using the CKD EPI equation.     This calculation has not been     validated in all clinical     situations.     eGFR's persistently     <90 mL/min signify     possible Chronic Kidney Disease.  CBC WITH DIFFERENTIAL     Status: None   Collection Time    11/10/12  5:52 AM      Result Value Range   WBC 7.7  4.0 - 10.5 K/uL   RBC 3.96  3.87 - 5.11 MIL/uL   Hemoglobin 12.4  12.0 - 15.0 g/dL   HCT 13.2  44.0 - 10.2 %   MCV 95.2  78.0 - 100.0 fL   MCH 31.3  26.0 - 34.0 pg   MCHC 32.9  30.0 - 36.0 g/dL   RDW 72.5  36.6 - 44.0 %   Platelets 310  150 - 400 K/uL   Neutrophils Relative 65  43 - 77 %   Neutro Abs 5.0  1.7 - 7.7 K/uL   Lymphocytes Relative 22  12 - 46 %  Lymphs Abs 1.7  0.7 - 4.0 K/uL   Monocytes Relative 8  3 - 12 %   Monocytes Absolute 0.6  0.1 - 1.0 K/uL   Eosinophils Relative 5  0 - 5 %   Eosinophils Absolute 0.4  0.0 - 0.7 K/uL   Basophils Relative 1  0 - 1 %   Basophils Absolute 0.0  0.0 - 0.1 K/uL  COMPREHENSIVE METABOLIC PANEL     Status: Abnormal   Collection Time    11/10/12  5:52 AM      Result Value Range   Sodium 144  135 - 145 mEq/L   Potassium 4.0  3.5 - 5.1 mEq/L   Chloride 108  96 - 112 mEq/L   CO2 28  19 - 32 mEq/L   Glucose, Bld 91  70 - 99 mg/dL   BUN 17  6 - 23 mg/dL   Creatinine, Ser 6.57  0.50 - 1.10 mg/dL   Calcium 8.7  8.4 - 84.6 mg/dL   Total Protein 6.2  6.0 - 8.3 g/dL   Albumin 3.0 (*) 3.5 - 5.2 g/dL   AST 15  0 - 37 U/L   ALT 12  0 - 35 U/L   Alkaline Phosphatase 64  39 - 117 U/L   Total Bilirubin 0.3  0.3 - 1.2 mg/dL   GFR calc non Af Amer 64 (*) >90 mL/min   GFR calc Af Amer 74 (*) >90 mL/min   Comment:            The eGFR has been calculated     using the CKD EPI equation.     This calculation has not been     validated in all clinical      situations.     eGFR's persistently     <90 mL/min signify     possible Chronic Kidney Disease.     HEENT: normal Cardio: RRR Resp: CTA B/L GI: BS positive Extremity:  Pulses positive and No Edema Skin:   Intact Neuro: Alert/Oriented, Cranial Nerve II-XII normal, Abnormal Sensory reduced pinprick LUE and LLE , Abnormal Motor 2-/5 L delt Bi tri grip HF 3-/5 Knee ext and ankle DF/PF, Abnormal FMC Ataxic/ dec FMC and Tone  Within Normal Limits, Tone:  Within Normal Limits and Other No aphasia or dysarthria Musc/Skel:  Normal Gen NAD   Assessment/Plan: 1. Functional deficits secondary to brainstem infarct R pontine which require 3+ hours per day of interdisciplinary therapy in a comprehensive inpatient rehab setting. Physiatrist is providing close team supervision and 24 hour management of active medical problems listed below. Physiatrist and rehab team continue to assess barriers to discharge/monitor patient progress toward functional and medical goals. FIM: FIM - Bathing Bathing Steps Patient Completed: Chest;Right Arm;Left Arm;Abdomen;Left upper leg;Right upper leg;Buttocks;Front perineal area;Right lower leg (including foot);Left lower leg (including foot) Bathing: 5: Supervision: Safety issues/verbal cues  FIM - Upper Body Dressing/Undressing Upper body dressing/undressing: 0: Wears gown/pajamas-no public clothing FIM - Lower Body Dressing/Undressing Lower body dressing/undressing steps patient completed: Thread/unthread right underwear leg;Thread/unthread left underwear leg;Pull underwear up/down;Thread/unthread right pants leg;Thread/unthread left pants leg;Pull pants up/down;Don/Doff right sock;Don/Doff left sock Lower body dressing/undressing: 5: Supervision: Safety issues/verbal cues  FIM - Toileting Toileting steps completed by patient: Performs perineal hygiene;Adjust clothing prior to toileting;Adjust clothing after toileting Toileting: 5: Supervision: Safety  issues/verbal cues  FIM - Archivist Transfers Assistive Devices: Grab bars Toilet Transfers: 5-To toilet/BSC: Supervision (verbal cues/safety issues);5-From toilet/BSC: Supervision (verbal cues/safety issues);4-From toilet/BSC: Min A (  steadying Pt. > 75%)  FIM - Bed/Chair Transfer Bed/Chair Transfer Assistive Devices: Bed rails Bed/Chair Transfer: 5: Supine > Sit: Supervision (verbal cues/safety issues);4: Sit > Supine: Min A (steadying pt. > 75%/lift 1 leg);4: Bed > Chair or W/C: Min A (steadying Pt. > 75%);4: Chair or W/C > Bed: Min A (steadying Pt. > 75%)  FIM - Locomotion: Ambulation Ambulation/Gait Assistance: 4: Min guard Locomotion: Ambulation: 4: Travels 150 ft or more with minimal assistance (Pt.>75%)  Comprehension Comprehension Mode: Auditory Comprehension: 7-Follows complex conversation/direction: With no assist  Expression Expression Mode: Verbal Expression: 6-Expresses complex ideas: With extra time/assistive device  Social Interaction Social Interaction: 7-Interacts appropriately with others - No medications needed.  Problem Solving Problem Solving: 5-Solves complex 90% of the time/cues < 10% of the time  Memory Memory: 7-Complete Independence: No helper  Medical Problem List and Plan:  1. 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 DVT Prophylaxis/Anticoagulation: Subcutaneous Lovenox. Monitor platelet counts any signs of bleeding. Recheck in the am  3. Pain Management: Ultram as needed. Monitor with increased mobility  4. Neuropsych: This patient is capable of making decisions on his/her own behalf.  5. History of VP shunt/hydrocephalus x10 years  6. History of breast cancer status post bilateral mastectomy chemotherapy. Currently in remission and not receiving any treatment. Question whether patient has had bilateral axillary dissection. Will check with her surgeon at [facility]. She is getting blood pressures  checked in both legs which may or may not be accurate 7. History of asthma. Combivent inhaler as needed. Denies any shortness of breath  8. Hypertension. Toprol-XL 50 mg daily. Monitor with increased mobility and adjust as indicated  9. Hyperlipidemia. Zocor    LOS (Days) 2 A FACE TO FACE EVALUATION WAS PERFORMED  KIRSTEINS,ANDREW E 11/11/2012, 7:17 AM

## 2012-11-11 NOTE — Progress Notes (Signed)
Occupational Therapy Note  Patient Details  Name: Desiree Mitchell MRN: [id number] Date of Birth: 1956/02/07 Today's Date: 11/11/2012  Time: 10-10:45am ( .) Pt seen for 1:1 session focusing on functional mobility, transfers, functional tasks. Pt walked from room to ADL apartment with close S and stood for most of session with no rest breaks needed. Pt made up bed in ADL apartment. Discussed tub transfers in pt's home with pt stating she does not have a tub transfer bench yet. Pt will most likely require a tub bench as she cannot lift her legs over tub. Pt walked back to her room, pushing her wheelchair. Pt required intermittent verbal cues to stay on task, as she tends to talk quite a bit. Ended session with pt sitting in recliner with call bell within reach. No pain reported today.   Khayri Kargbo Hessie Diener 11/11/2012, 12:18 PM

## 2012-11-11 NOTE — Plan of Care (Signed)
Problem: RH BOWEL ELIMINATION Goal: RH STG MANAGE BOWEL WITH ASSISTANCE STG Manage Bowel with mod I  Outcome: Not Progressing Sorbitol given 4/2 with results

## 2012-11-11 NOTE — Progress Notes (Addendum)
Physical Therapy Session Note  Patient Details  Name: Desiree Mitchell MRN: [id number] Date of Birth: [birth year]  Today's Date: 11/11/2012 Time: 0805-0905 and 1130-1200 Time Calculation (min): 60 min and 30 min  Short Term Goals: Week 1:  PT Short Term Goal 1 (Week 1): STG's = LTG's due to LOS  Skilled Therapeutic Interventions/Progress Updates:  1st treatment: High level gait: obstacle course weaving in/out of cones, retrieving items from floor. With fatigue, pt demonstrates poor eccentric control L knee, with knee hyperextension. Up/down 5 steps with 2 rails, close supervision, mod cues for sequencing.   Gait gym through unit, to room, 300' with close supervision, VCs for L knee control; pt states she is unable to feel L knee snap backwards.  neuromuscular re-education via demo, visual feedback, VCs for: - sitting-  -LLE heel/shin  -alternating toe taps-  L and R ankle DF  -trunk shortening/lengthening with reaching, feet unsupported to facilitate balance reactions  -standing- -L hamstring activation in standing for LLE stance control -calf raises, toe raises to facilitate balance strategies -standing on compliant Airex mat during wt shifting laterally, and during bil UE task of folding pillow cases -toe raises on compliant mat with good elicitation of ankle and hip strategies  2nd session: Gait without AD, room>< 125' gym focusing on L stance control, decreased knee hyperextension.  neuromuscular re-education as above, for : -L hamstring motor control via kicking a ball with L and R LE, RUe support -trunk rotation, balance strategies during transitional stepping 2 steps forward/backward to L or R, during L or R hand task involving reaching and retrieving overhead -symmetrical extension bil LEs on Kinetron in sitting, at 40 cm/sec x 3 minutes, without UE support  Pt stated she needed to use BR for BM.  Toilet transfer with supervision; hygiene and clothing mgt with supervision.   Gait in home setting and transfer to recliner with supervision.     Therapy Documentation Precautions:  Precautions Precautions: Fall Restrictions Weight Bearing Restrictions: No   Pain: Pain Assessment Pain Assessment: No/denies pain both sessions    Treatments Neuromuscular Facilitation: Lower Extremity;Upper Extremity;Activity to increase coordination;Activity to increase motor control;Activity to increase timing and sequencing;Activity to increase lateral weight shifting;Activity to increase grading;Forced use See FIM for current functional status  Therapy/Group: Individual Therapy  Desiree Mitchell 11/11/2012, 11:56 AM

## 2012-11-11 NOTE — Patient Care Conference (Signed)
Inpatient RehabilitationTeam Conference and Plan of Care Update Date: 11/11/2012   Time: 11:30 AM    Patient Name: Desiree Mitchell      Medical Record Number: [id number]  Date of Birth: 04-18-1956 Sex: Female         Room/Bed: 4005/4005-01 Payor Info: Payor: MEDICAID [place]  Plan: MEDICAID [place] ACCESS  Product Type: *No Product type*     Admitting Diagnosis: RT CVA  Admit Date/Time:  11/09/2012  3:41 PM Admission Comments: No comment available   Primary Diagnosis:  CVA (cerebral vascular accident) Principal Problem: CVA (cerebral vascular accident)  Patient Active Problem List   Diagnosis Date Noted  . CVA (cerebral vascular accident) 11/10/2012  . Weakness of left side of body 11/05/2012  . HTN (hypertension) 11/05/2012  . 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 Obesity 11/05/2012  . Asthma 11/05/2012    Expected Discharge Date: Expected Discharge Date: 11/14/12  Team Members Present: Physician leading conference: Dr. Claudette Laws Social Worker Present: Dossie Der, LCSW Nurse Present: Laural Roes, RN PT Present: Edman Circle, PT;Becky Henrene Dodge, PT;Caroline [place], PT;Other (comment) Clarisse Gouge ripa-PT) OT Present: Mackie Pai, Verlene Mayer, OT SLP Present: Fae Pippin, SLP Other (Discipline and Name): Charolette Child Coordinator     Current Status/Progress Goal Weekly Team Focus  Medical   Hypertension uncontrolled, cannot take BPs in arms  Adequate blood pressure management  Check  op notes from surgery last year to see whether patient has had bilateral axillary dissection   Bowel/Bladder   continent bowel/bladder, lbm 4/2 after sorbitol given  mod independent  toilet as needed, monitor for bm q2days or more frequently   Swallow/Nutrition/ Hydration             ADL's   supervision  mod I  balance, LUE neuro re-ed, pt/family educ, ADL retraining   Mobility   supervision to occasional min assist  mod I with transfers, ambulation, & supervision stairs   activity tolerance; strengthening left LE; high level balance and gait; patient/family education   Communication     na        Safety/Cognition/ Behavioral Observations    na        Pain     na        Skin   skin C.D.I.  no new breakdown while on rehab  monitor       *See Care Plan and progress notes for long and short-term goals.  Barriers to Discharge: Still requiring physical assistance    Possible Resolutions to Barriers:  Continue therapy    Discharge Planning/Teaching Needs:  Home with friend who can priovide supervision level      Team Discussion:  Strengthening doing well, making progress.  No speech to follow.  Short length of stay  Revisions to Treatment Plan:  None   Continued Need for Acute Rehabilitation Level of Care: The patient requires daily medical management by a physician with specialized training in physical medicine and rehabilitation for the following conditions: Daily direction of a multidisciplinary physical rehabilitation program to ensure safe treatment while eliciting the highest outcome that is of practical value to the patient.: Yes Daily medical management of patient stability for increased activity during participation in an intensive rehabilitation regime.: Yes Daily analysis of laboratory values and/or radiology reports with any subsequent need for medication adjustment of medical intervention for : Neurological problems;Post surgical problems  Eithan Beagle, Lemar Livings 11/12/2012, 8:09 AM

## 2012-11-12 ENCOUNTER — Inpatient Hospital Stay (HOSPITAL_COMMUNITY): Payer: Medicaid Other | Admitting: Occupational Therapy

## 2012-11-12 ENCOUNTER — Inpatient Hospital Stay (HOSPITAL_COMMUNITY): Payer: Medicaid Other

## 2012-11-12 MED ORDER — HYDROCODONE-ACETAMINOPHEN 5-325 MG PO TABS: 1.0000 | ORAL_TABLET | Freq: Four times a day (QID) | ORAL | Status: DC | PRN

## 2012-11-12 NOTE — Progress Notes (Addendum)
Occupational Therapy Session Note  Patient Details  Name: Desiree Mitchell MRN: [id number] Date of Birth: Jul 07, 1956  Today's Date: 11/12/2012 Time: 0803-0900 and 4098-1191 Time Calculation (min): 57 min and 30 min  Short Term Goals:  STG = LTGs     Skilled Therapeutic Interventions/Progress Updates:      Visit 1: No c/o pain.  Pt seen for BADL retraining of toileting, bathing, and dressing with a focus on dynamic balance and functional use of LUE.  Pt ambulated around room with mod I to gather clothing from drawers, stood to remove pants, and ambulated into shower all with mod I. Pt was also able to complete grooming and dressing with mod I.  Visit 2:  No c/o pain. Pt ambulated to ADL apartment to work on tub transfers.  Min assist with stepping in and out of tub and mod I with using a tub bench. Pt will need a tub bench for home use.  In kitchen, pt worked on making a fried egg including retreiving and finding cooking utensils, pan, ingredients all with mod I.   Tomorrow pt will need to complete ADL routine in apartment to reinforce skills learned.     Therapy Documentation Precautions:  Precautions Precautions: Fall Restrictions Weight Bearing Restrictions: No     Pain: Pain Assessment Pain Assessment: No/denies pain Pain Score: 0-No pain Faces Pain Scale: No hurt ADL: See FIM for current functional status  Therapy/Group: Individual Therapy  [name] 11/12/2012, 11:50 AM

## 2012-11-12 NOTE — Progress Notes (Signed)
Social Work Patient ID: Desiree Mitchell, female   DOB: 19-May-1956, 57 y.o.   MRN: [id number] Met with pt to discuss follow up therapies, she reports she has no transportation to [place] will make home health referral. Pt still trying to get PCP name for this worker so follow up appt can be set up prior to discharge.  Feeling more confident about discharge Sat.

## 2012-11-12 NOTE — ED Provider Notes (Addendum)
History     CSN: [id number]  Arrival date & time 11/05/12  1054   First MD Initiated Contact with Patient 11/05/12 1234      Chief Complaint  Patient presents with  . Stroke Symptoms    (Consider location/radiation/quality/duration/timing/severity/associated sxs/prior treatment) HPI Comments: Desiree Mitchell is a 57 y.o. female PMH of breast cancer (s/p bilat mastectomy and chemotherapy; currently in remision and no receiving any treatment), HTN, and hydrocephalus (with VP shunt placement); came to ED after experiencing sudden onset of left side weakness and numbness. Patient reports symptoms first started Tuesday night (only her left leg was feeling heavy); then on Wednesday symptoms and heavy left side sensation worsen and start affecting her arm; patient decline seeking medical attention at that moment - as she has several comorbidities, and she thought that she was going to improve. She comes to the ED, as she now has difficulty walking. No hx of strokes.  The history is provided by medical records and the patient.    Past Medical History  Diagnosis Date  . Hypertension   . Breast cancer   . Hydrocephalus   . Asthma     Past Surgical History  Procedure Laterality Date  . Brain surgery      stent in head    No family history on file.  History  Substance Use Topics  . Smoking status: Never Smoker   . Smokeless tobacco: Not on file  . Alcohol Use: Yes    OB History   Grav Para Term Preterm Abortions TAB SAB Ect Mult Living                  Review of Systems  Constitutional: Positive for activity change.  HENT: Negative for facial swelling and neck pain.   Respiratory: Negative for cough, shortness of breath and wheezing.   Cardiovascular: Negative for chest pain.  Gastrointestinal: Negative for nausea, vomiting, abdominal pain, diarrhea, constipation, blood in stool and abdominal distention.  Genitourinary: Negative for hematuria and difficulty urinating.   Skin: Negative for color change.  Neurological: Positive for speech difficulty and weakness. Negative for seizures and headaches.  Hematological: Does not bruise/bleed easily.  Psychiatric/Behavioral: Negative for confusion.    Allergies  Tape  Home Medications  No current outpatient prescriptions on file.  BP 179/80  Pulse 56  Temp(Src) 98 F (36.7 C) (Oral)  Resp 16  Ht 5\' 1"  (1.549 m)  SpO2 97%  Physical Exam  Nursing note and vitals reviewed. Constitutional: She is oriented to person, place, and time. She appears well-developed and well-nourished.  HENT:  Head: Normocephalic and atraumatic.  Eyes: EOM are normal. Pupils are equal, round, and reactive to light.  Neck: Neck supple.  Cardiovascular: Normal rate, regular rhythm and normal heart sounds.   No murmur heard. Pulmonary/Chest: Effort normal. No respiratory distress.  Abdominal: Soft. She exhibits no distension. There is no tenderness. There is no rebound and no guarding.  Neurological: She is alert and oriented to person, place, and time.  Left upper and left lower extremity has marked decrease in strength, and also mild sensory deficits.  Skin: Skin is warm and dry.    ED Course  Procedures (including critical care time)  Labs Reviewed  BASIC METABOLIC PANEL - Abnormal; Notable for the following:    GFR calc non Af Amer 69 (*)    GFR calc Af Amer 80 (*)    All other components within normal limits  HEMOGLOBIN A1C - Abnormal; Notable for the  following:    Hemoglobin A1C 6.1 (*)    Mean Plasma Glucose 128 (*)    All other components within normal limits  CBC - Abnormal; Notable for the following:    RBC 3.86 (*)    All other components within normal limits  CREATININE, SERUM - Abnormal; Notable for the following:    GFR calc non Af Amer 66 (*)    GFR calc Af Amer 77 (*)    All other components within normal limits  URINALYSIS, ROUTINE W REFLEX MICROSCOPIC - Abnormal; Notable for the following:     APPearance CLOUDY (*)    Leukocytes, UA LARGE (*)    All other components within normal limits  LIPID PANEL - Abnormal; Notable for the following:    Triglycerides 162 (*)    HDL 39 (*)    LDL Cholesterol 109 (*)    All other components within normal limits  URINE MICROSCOPIC-ADD ON - Abnormal; Notable for the following:    Squamous Epithelial / LPF FEW (*)    Bacteria, UA FEW (*)    All other components within normal limits  URINE CULTURE  CBC WITH DIFFERENTIAL  TROPONIN I  TROPONIN I  TROPONIN I  TSH  VITAMIN B12   No results found.   1. Stroke   2. Asthma   3. H/O cerebral aneurysm repair   4. HTN (hypertension)   5. Obesity   6. Weakness of left side of body       MDM  DDx includes:  Stroke - ischemic vs. hemorrhagic TIA Neuropathy Myelitis Electrolyte abnormality Neuropathy Muscular disease  Pt with hx of HTN, Breast Ca comes in with left sided deficits. Concerns for stroke. Will get Neurology involved. Not a candidate for TPA as she is out of the window.   Date: 11/12/2012  Rate: 70  Rhythm: normal sinus rhythm  QRS Axis: normal  Intervals: normal  ST/T Wave abnormalities: normal  Conduction Disutrbances: none  Narrative Interpretation: unremarkable      Derwood Kaplan, MD 11/12/12 1610       Derwood Kaplan, MD 11/12/12 9604

## 2012-11-12 NOTE — Progress Notes (Signed)
Patient ID: Desiree Mitchell, female   DOB: 1956-06-13, 57 y.o.   MRN: [id number] Subjective/Complaints: 57 y.o. right-handed female with history of breast cancer(status post bilateral mastectomy chemotherapy, currently in remission and not receiving any treatment), hypertension, hydrocephalus(cerebral aneurysm repair) with VP shunt placement x10 years. Admitted 11/05/2012 oh left-sided weakness. Cranial CT scan with diffuse white matter disease, left parietal ventriculostomy shunt in place. No acute intracranial abnormality noted. MRI of the brain completed a history of VP shunt. Patient did not receive TPA. Echocardiogram with ejection fraction of 65% grade 1 diastolic dysfunction. Venous Doppler studies of lower extremities negative for DVT. Carotid Dopplers with no ICA stenosis. Neurology services consulted presently on aspirin therapy for stroke prophylaxis as well as subcutaneous Lovenox. Problems with leg pain last noc, not currently no falls or trauma, no prior hx  Review of Systems  Eyes: Negative for blurred vision and double vision.  Neurological: Positive for sensory change and focal weakness. Negative for speech change and seizures.  All other systems reviewed and are negative.    Objective: Vital Signs: Blood pressure 168/85, pulse 71, temperature 98 F (36.7 C), temperature source Oral, resp. rate 18, height 5\' 1"  (1.549 m), weight 94.348 kg (208 lb), SpO2 97.00%. No results found. Results for orders placed during the hospital encounter of 11/09/12 (from the past 72 hour(s))  CBC     Status: None   Collection Time    11/09/12  4:30 PM      Result Value Range   WBC 9.3  4.0 - 10.5 K/uL   RBC 3.88  3.87 - 5.11 MIL/uL   Hemoglobin 12.3  12.0 - 15.0 g/dL   HCT 40.9  81.1 - 91.4 %   MCV 94.8  78.0 - 100.0 fL   MCH 31.7  26.0 - 34.0 pg   MCHC 33.4  30.0 - 36.0 g/dL   RDW 78.2  95.6 - 21.3 %   Platelets 334  150 - 400 K/uL  CREATININE, SERUM     Status: Abnormal   Collection Time     11/09/12  4:30 PM      Result Value Range   Creatinine, Ser 0.99  0.50 - 1.10 mg/dL   GFR calc non Af Amer 63 (*) >90 mL/min   GFR calc Af Amer 73 (*) >90 mL/min   Comment:            The eGFR has been calculated     using the CKD EPI equation.     This calculation has not been     validated in all clinical     situations.     eGFR's persistently     <90 mL/min signify     possible Chronic Kidney Disease.  CBC WITH DIFFERENTIAL     Status: None   Collection Time    11/10/12  5:52 AM      Result Value Range   WBC 7.7  4.0 - 10.5 K/uL   RBC 3.96  3.87 - 5.11 MIL/uL   Hemoglobin 12.4  12.0 - 15.0 g/dL   HCT 08.6  57.8 - 46.9 %   MCV 95.2  78.0 - 100.0 fL   MCH 31.3  26.0 - 34.0 pg   MCHC 32.9  30.0 - 36.0 g/dL   RDW 62.9  52.8 - 41.3 %   Platelets 310  150 - 400 K/uL   Neutrophils Relative 65  43 - 77 %   Neutro Abs 5.0  1.7 - 7.7 K/uL  Lymphocytes Relative 22  12 - 46 %   Lymphs Abs 1.7  0.7 - 4.0 K/uL   Monocytes Relative 8  3 - 12 %   Monocytes Absolute 0.6  0.1 - 1.0 K/uL   Eosinophils Relative 5  0 - 5 %   Eosinophils Absolute 0.4  0.0 - 0.7 K/uL   Basophils Relative 1  0 - 1 %   Basophils Absolute 0.0  0.0 - 0.1 K/uL  COMPREHENSIVE METABOLIC PANEL     Status: Abnormal   Collection Time    11/10/12  5:52 AM      Result Value Range   Sodium 144  135 - 145 mEq/L   Potassium 4.0  3.5 - 5.1 mEq/L   Chloride 108  96 - 112 mEq/L   CO2 28  19 - 32 mEq/L   Glucose, Bld 91  70 - 99 mg/dL   BUN 17  6 - 23 mg/dL   Creatinine, Ser 1.61  0.50 - 1.10 mg/dL   Calcium 8.7  8.4 - 09.6 mg/dL   Total Protein 6.2  6.0 - 8.3 g/dL   Albumin 3.0 (*) 3.5 - 5.2 g/dL   AST 15  0 - 37 U/L   ALT 12  0 - 35 U/L   Alkaline Phosphatase 64  39 - 117 U/L   Total Bilirubin 0.3  0.3 - 1.2 mg/dL   GFR calc non Af Amer 64 (*) >90 mL/min   GFR calc Af Amer 74 (*) >90 mL/min   Comment:            The eGFR has been calculated     using the CKD EPI equation.     This calculation has not been      validated in all clinical     situations.     eGFR's persistently     <90 mL/min signify     possible Chronic Kidney Disease.     HEENT: normal Cardio: RRR Resp: CTA B/L GI: BS positive Extremity:  Pulses positive and No Edema Skin:   Intact Neuro: Alert/Oriented, Cranial Nerve II-XII normal, Abnormal Sensory reduced pinprick LUE and LLE , Abnormal Motor 2-/5 L delt Bi tri grip HF 3-/5 Knee ext and ankle DF/PF, Abnormal FMC Ataxic/ dec FMC and Tone  Within Normal Limits, Tone:  Within Normal Limits and Other No aphasia or dysarthria Musc/Skel:  Normal Gen NAD BLE no swelling, decreased sensory only on Left , no joint swelling, no calf or thigh tenderness  Assessment/Plan: 1. Functional deficits secondary to brainstem infarct R pontine which require 3+ hours per day of interdisciplinary therapy in a comprehensive inpatient rehab setting. Physiatrist is providing close team supervision and 24 hour management of active medical problems listed below. Physiatrist and rehab team continue to assess barriers to discharge/monitor patient progress toward functional and medical goals. FIM: FIM - Bathing Bathing Steps Patient Completed: Chest;Right Arm;Left Arm;Abdomen;Left upper leg;Right upper leg;Buttocks;Front perineal area;Right lower leg (including foot);Left lower leg (including foot) Bathing: 5: Supervision: Safety issues/verbal cues  FIM - Upper Body Dressing/Undressing Upper body dressing/undressing steps patient completed: Thread/unthread right sleeve of pullover shirt/dresss;Thread/unthread left sleeve of pullover shirt/dress;Put head through opening of pull over shirt/dress;Pull shirt over trunk Upper body dressing/undressing: 5: Supervision: Safety issues/verbal cues FIM - Lower Body Dressing/Undressing Lower body dressing/undressing steps patient completed: Thread/unthread right underwear leg;Thread/unthread left underwear leg;Pull underwear up/down;Thread/unthread right pants  leg;Thread/unthread left pants leg;Pull pants up/down;Don/Doff left shoe;Don/Doff right shoe Lower body dressing/undressing: 5: Supervision: Safety issues/verbal  cues  FIM - Toileting Toileting steps completed by patient: Adjust clothing prior to toileting;Performs perineal hygiene;Adjust clothing after toileting Toileting: 5: Supervision: Safety issues/verbal cues  FIM - Archivist Transfers Assistive Devices: Grab bars Toilet Transfers: 5-To toilet/BSC: Supervision (verbal cues/safety issues);5-From toilet/BSC: Supervision (verbal cues/safety issues)  FIM - Press photographer Assistive Devices: Bed rails Bed/Chair Transfer: 7: Supine > Sit: No assist;7: Sit > Supine: No assist;5: Bed > Chair or W/C: Supervision (verbal cues/safety issues);5: Chair or W/C > Bed: Supervision (verbal cues/safety issues)  FIM - Locomotion: Ambulation Locomotion: Ambulation Assistive Devices:  (no AD) Ambulation/Gait Assistance: 4: Min guard Locomotion: Ambulation: 5: Travels 150 ft or more with supervision/safety issues (300)  Comprehension Comprehension Mode: Auditory Comprehension: 7-Follows complex conversation/direction: With no assist  Expression Expression Mode: Verbal Expression: 7-Expresses complex ideas: With no assist  Social Interaction Social Interaction: 7-Interacts appropriately with others - No medications needed.  Problem Solving Problem Solving: 7-Solves complex problems: Recognizes & self-corrects  Memory Memory: 7-Complete Independence: No helper  Medical Problem List and Plan:  1. 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 DVT Prophylaxis/Anticoagulation: Subcutaneous Lovenox. Monitor platelet counts any signs of bleeding. Recheck in the am  3. Pain Management: Ultram as needed. Monitor with increased mobility Leg pain exam unremarkable, moniotr 4. Neuropsych: This patient is capable of making decisions  on his/her own behalf.  5. History of VP shunt/hydrocephalus x10 years  6. History of breast cancer status post bilateral mastectomy chemotherapy. Currently in remission and not receiving any treatment. Question whether patient has had bilateral axillary dissection. Will check with her surgeon at [facility]. She is getting blood pressures checked in both legs which may or may not be accurate 7. History of asthma. Combivent inhaler as needed. Denies any shortness of breath  8. Hypertension. Toprol-XL 50 mg daily. Monitor with increased mobility and adjust as indicated  9. Hyperlipidemia. Zocor    LOS (Days) 3 A FACE TO FACE EVALUATION WAS PERFORMED  Veryl Abril E 11/12/2012, 7:08 AM

## 2012-11-12 NOTE — Progress Notes (Signed)
Physical Therapy Session Note  Patient Details  Name: Desiree Mitchell MRN: [id number] Date of Birth: Dec 21, 1955  Today's Date: 11/12/2012 Time:0903-1000 and  4098-1191 Time Calculation (min): 57 and 50 min  Short Term Goals: Week 1:  PT Short Term Goal 1 (Week 1): STG's = LTG's due to LOS     Skilled Therapeutic Interventions/Progress Updates:  1st tx- High level gait on carpet in congested setting, weaving in/out of chairs and equipment, requiring sidestepping and backwards stepping without LOB, with supervision.  Community gait using elevators, pulling open doors, walking on sloping concrete x 1000' with 1 seated rest break, with 1 LOB backwards as she stepped onto elevator, recovered independently. Pt using SMBQ on trial, with safe use; pt unsure if she wants this AD at d/c.  Pt continues to have L knee hyperextension with L stance phase, > 50% ot time.  neuromuscular re-education via demo, VCs, for: Targeted toe tapping in sitting, focusing on rapid alternating movements, L heel slides, trunk shortening/lengthening, all to facilitate increased speed, coordination and balance reactions.  Discussed lifetime fitness.  Pt reported that she has a safe area in which to walk in her neighborhood, after HHPT has cleared her for this.  2nd tx- Gait training with RW to decide upon AD, x 150' with supervision.  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  neuromuscular re-education as above: -L hamstring activation to decrease L knee hyperextension with stance phase -bil terminal hip extension and alternating hip abduction in tall kneeling on mat -L hand manipulation and toss of horseshoes in standing, no LOB 10/10 trials -retrieving and carrying items from floor, using L hand -alternating step/taps onto 5" high bench, focusing on L hip flexion sufficient to clear L foot, much improved in last couple of days -balance retraining in standing  given external perturbations in all directions; hip, ankle and stepping strategies elicited in all directions, but delayed with LLE during  posterior perturbations -gait returning to room, focusing on trunk rotation and increased L swing phase to increase velocity  Floor transfer onto mat; pt return demonstrated modified independent after 1 demo. HEP Level C challenge exs; no weights, hand out provided.  Pt is now modified independent in room, without AD.  Therapy Documentation Precautions:  Precautions Precautions: Fall Restrictions Weight Bearing Restrictions: No  Pain:none AM or PM sessions      See FIM for current functional status  Therapy/Group: Individual Therapy  Kannan Proia 11/12/2012, 2:21 PM

## 2012-11-13 ENCOUNTER — Inpatient Hospital Stay (HOSPITAL_COMMUNITY): Payer: Medicaid Other | Admitting: Occupational Therapy

## 2012-11-13 ENCOUNTER — Inpatient Hospital Stay (HOSPITAL_COMMUNITY): Payer: Medicaid Other

## 2012-11-13 ENCOUNTER — Inpatient Hospital Stay (HOSPITAL_COMMUNITY): Payer: Medicaid Other | Admitting: *Deleted

## 2012-11-13 DIAGNOSIS — I6330 Cerebral infarction due to thrombosis of unspecified cerebral artery: Secondary | ICD-10-CM

## 2012-11-13 DIAGNOSIS — G8110 Spastic hemiplegia affecting unspecified side: Secondary | ICD-10-CM

## 2012-11-13 MED ORDER — PANTOPRAZOLE SODIUM 40 MG PO TBEC: 40.0000 mg | DELAYED_RELEASE_TABLET | Freq: Every day | ORAL | Status: DC

## 2012-11-13 MED ORDER — METOPROLOL SUCCINATE ER 50 MG PO TB24: 50.0000 mg | ORAL_TABLET | Freq: Every morning | ORAL | Status: DC

## 2012-11-13 MED ORDER — ASPIRIN 325 MG PO TABS: 325.0000 mg | ORAL_TABLET | Freq: Every day | ORAL | Status: DC

## 2012-11-13 MED ORDER — SIMVASTATIN 40 MG PO TABS: 40.0000 mg | ORAL_TABLET | Freq: Every day | ORAL | Status: DC

## 2012-11-13 MED ORDER — HYDROCODONE-ACETAMINOPHEN 5-325 MG PO TABS: 1.0000 | ORAL_TABLET | Freq: Four times a day (QID) | ORAL | Status: DC | PRN

## 2012-11-13 MED ORDER — IPRATROPIUM-ALBUTEROL 18-103 MCG/ACT IN AERO: 2.0000 | INHALATION_SPRAY | Freq: Four times a day (QID) | RESPIRATORY_TRACT | Status: DC | PRN

## 2012-11-13 NOTE — Progress Notes (Signed)
Social Work Discharge Note Discharge Note  The overall goal for the admission was met for:   Discharge location: Yes-HOME WITH FRIEND-DIANNA   Length of Stay: Yes-5 DAYS  Discharge activity level: Yes-MOD/I LEVEL  Home/community participation: Yes  Services provided included: MD, RD, PT, OT, SLP, RN, Pharmacy and SW  Financial Services: Medicaid  Follow-up services arranged: Home Health: [facility]-PT,OT,RN, DME: [facility] and Patient/Family has no preference for HH/DME agencies  Comments (or additional information):MET GOALS AND PLEASED WITH PROGRESS.  SHE WILL SET UP APPT WITHY PCP SINCE HAD INFORMATION AT HOME  Patient/Family verbalized understanding of follow-up arrangements: Yes  Individual responsible for coordination of the follow-up plan: SELF & DIANNA-FRIEND  Confirmed correct DME delivered: Lucy Chris 11/13/2012    Lucy Chris

## 2012-11-13 NOTE — Discharge Summary (Signed)
  Discharge summary job [id number]

## 2012-11-13 NOTE — Progress Notes (Signed)
Patient ID: Desiree Mitchell, female   DOB: 07-09-1956, 57 y.o.   MRN: [id number] Subjective/Complaints: 57 y.o. right-handed female with history of breast cancer(status post bilateral mastectomy chemotherapy, currently in remission and not receiving any treatment), hypertension, hydrocephalus(cerebral aneurysm repair) with VP shunt placement x10 years. Admitted 11/05/2012 oh left-sided weakness. Cranial CT scan with diffuse white matter disease, left parietal ventriculostomy shunt in place. No acute intracranial abnormality noted. MRI of the brain completed a history of VP shunt. Patient did not receive TPA. Echocardiogram with ejection fraction of 65% grade 1 diastolic dysfunction. Venous Doppler studies of lower extremities negative for DVT. Carotid Dopplers with no ICA stenosis. Neurology services consulted presently on aspirin therapy for stroke prophylaxis as well as subcutaneous Lovenox.  Muscle soreness upper thigh Review of Systems  Eyes: Negative for blurred vision and double vision.  Neurological: Positive for sensory change and focal weakness. Negative for speech change and seizures.  All other systems reviewed and are negative.    Objective: Vital Signs: Blood pressure 150/72, pulse 62, temperature 97.5 F (36.4 C), temperature source Oral, resp. rate 20, height 5\' 1"  (1.549 m), weight 94.348 kg (208 lb), SpO2 97.00%. No results found. No results found for this or any previous visit (from the past 72 hour(s)).   HEENT: normal Cardio: RRR Resp: CTA B/L GI: BS positive Extremity:  Pulses positive and No Edema Skin:   Intact Neuro: Alert/Oriented, Cranial Nerve II-XII normal, Abnormal Sensory reduced pinprick LUE and LLE , Abnormal Motor 3-/5 L delt Bi tri grip HF 3-/5 Knee ext and ankle DF/PF, Abnormal FMC Ataxic/ dec FMC and Tone  Within Normal Limits, Tone:  Within Normal Limits and Other No aphasia or dysarthria Musc/Skel:  Normal Gen NAD BLE no swelling, decreased sensory only  on Left , no joint swelling, no calf or thigh tenderness  Assessment/Plan: 1. Functional deficits secondary to brainstem infarct R pontine which require 3+ hours per day of interdisciplinary therapy in a comprehensive inpatient rehab setting. Physiatrist is providing close team supervision and 24 hour management of active medical problems listed below. Should be medically ready for D/C in am FIM: FIM - Bathing Bathing Steps Patient Completed: Chest;Right Arm;Left Arm;Abdomen;Left upper leg;Right upper leg;Buttocks;Front perineal area;Right lower leg (including foot);Left lower leg (including foot) Bathing: 5: Supervision: Safety issues/verbal cues  FIM - Upper Body Dressing/Undressing Upper body dressing/undressing steps patient completed: Thread/unthread right sleeve of pullover shirt/dresss;Thread/unthread left sleeve of pullover shirt/dress;Put head through opening of pull over shirt/dress;Pull shirt over trunk Upper body dressing/undressing: 5: Supervision: Safety issues/verbal cues FIM - Lower Body Dressing/Undressing Lower body dressing/undressing steps patient completed: Thread/unthread right underwear leg;Thread/unthread left underwear leg;Pull underwear up/down;Thread/unthread right pants leg;Thread/unthread left pants leg;Pull pants up/down;Don/Doff left shoe;Don/Doff right shoe Lower body dressing/undressing: 5: Supervision: Safety issues/verbal cues  FIM - Toileting Toileting steps completed by patient: Adjust clothing prior to toileting;Performs perineal hygiene;Adjust clothing after toileting Toileting: 5: Supervision: Safety issues/verbal cues  FIM - Archivist Transfers Assistive Devices: Grab bars Toilet Transfers: 5-To toilet/BSC: Supervision (verbal cues/safety issues);5-From toilet/BSC: Supervision (verbal cues/safety issues)  FIM - Banker Devices: Bed rails Bed/Chair Transfer: 6: Bed > Chair or W/C: No assist;6:  Chair or W/C > Bed: No assist  FIM - Locomotion: Wheelchair Locomotion: Wheelchair: 0: Activity did not occur FIM - Locomotion: Ambulation Locomotion: Ambulation Assistive Devices: Occupational hygienist Ambulation/Gait Assistance: 5: Supervision Locomotion: Ambulation: 5: Travels 150 ft or more with supervision/safety issues  Comprehension Comprehension Mode: Auditory Comprehension: 7-Follows complex  conversation/direction: With no assist  Expression Expression Mode: Verbal Expression: 7-Expresses complex ideas: With no assist  Social Interaction Social Interaction: 7-Interacts appropriately with others - No medications needed.  Problem Solving Problem Solving: 7-Solves complex problems: Recognizes & self-corrects  Memory Memory: 7-Complete Independence: No helper  Medical Problem List and Plan:  1. 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 DVT Prophylaxis/Anticoagulation: Subcutaneous Lovenox. Monitor platelet counts any signs of bleeding. Recheck in the am  3. Pain Management: Ultram as needed. Monitor with increased mobility Leg pain exam unremarkable, moniotr 4. Neuropsych: This patient is capable of making decisions on his/her own behalf.  5. History of VP shunt/hydrocephalus x10 years  6. History of breast cancer status post bilateral mastectomy chemotherapy. Currently in remission and not receiving any treatment. Question whether patient has had bilateral axillary dissection. Will check with her surgeon at [facility]. She is getting blood pressures checked in both legs which may or may not be accurate 7. History of asthma. Combivent inhaler as needed. Denies any shortness of breath  8. Hypertension. Toprol-XL 50 mg daily. Monitor with increased mobility and adjust as indicated  9. Hyperlipidemia. Zocor    LOS (Days) 4 A FACE TO FACE EVALUATION WAS PERFORMED  Desiree Mitchell E 11/13/2012, 6:59 AM

## 2012-11-13 NOTE — Progress Notes (Signed)
Physical Therapy Discharge Summary  Patient Details  Name: Desiree Mitchell MRN: [id number] Date of Birth: [birth year]  Today's Date: 11/13/2012 Time: 0830-0930 Time Calculation (min): 60 min  Patient has met 7 of 7 long term goals due to improved activity tolerance, improved balance, increased strength and functional use of  left lower extremity.  Patient to discharge at an ambulatory level Modified Independent.   Patient's will discharge home with a friend who can assist as needed. Patient requires supervision on steps.   Reasons goals not met: n/a patient met all goals  Recommendation:  Patient will benefit from ongoing skilled PT services in outpatient setting to continue to advance safe functional mobility, address ongoing impairments in left LE weakness, and minimize fall risk.  Equipment: No equipment provided Patient reports that she has a small based quad cane at home.  Reasons for discharge: treatment goals met and discharge from hospital  Patient/family agrees with progress made and goals achieved: Yes  PT Discharge Precautions/Restrictions Restrictions Weight Bearing Restrictions: No Pain Pain Assessment Pain Assessment: No/denies pain Cognition Orientation Level: Oriented X4  Mobility Bed Mobility Bed Mobility: Rolling Right;Rolling Left;Supine to Sit;Sit to Supine Rolling Right: 7: Independent Rolling Left: 7: Independent Supine to Sit: 7: Independent Sitting - Scoot to Edge of Bed: 7: Independent Sit to Supine: 7: Independent Transfers Sit to Stand: 7: Independent Stand to Sit: 7: Independent Locomotion  Ambulation Ambulation: Yes Ambulation/Gait Assistance: 6: Modified independent (Device/Increase time) Ambulation Distance (Feet): 250 Feet Assistive device: Small based quad cane Gait Gait: Yes Gait Pattern: Within Functional Limits Gait Pattern: Step-through pattern;Decreased weight shift to left;Decreased hip/knee flexion - left High Level  Ambulation High Level Ambulation: Side stepping;Backwards walking;Sudden stops;Head turns;Direction changes Side Stepping: independent with side stepping each direction Backwards Walking: independent; decreased velocity Direction Changes: independently turns and changes directions; increased time Sudden Stops: Patient able to stop and turn and maintain balance Head Turns: Patient maintains speed with head turns Stairs / Additional Locomotion Stairs: Yes Stairs Assistance: 5: Supervision Stairs Assistance Details (indicate cue type and reason): Patient supervision up and down flight with 1 rail to access basement. patient up and down 2 steps with quad cane and supervision to access home. Stair Management Technique: One rail Right Number of Stairs: 14 Height of Stairs: 6 Curb: 6: Modified independent (Device/increase time)   Balance Standardized Balance Assessment Standardized Balance Assessment: Dynamic Gait Index Dynamic Gait Index Level Surface: Mild Impairment Change in Gait Speed: Mild Impairment Gait with Horizontal Head Turns: Normal Gait with Vertical Head Turns: Normal Gait and Pivot Turn: Mild Impairment Step Over Obstacle: Normal Step Around Obstacles: Normal Steps: Mild Impairment Total Score: 20  Skilled Intervention: Treatment session focused on grad day and reassessment for discharge. Patient up and down flight of stairs with 1 rail and supervision. Patient up and down 2 steps with small based quad cane and supervision to access home. Patient independent with bed mobility and modified independent with car and furniture transfers. Patient instructed in [place] home exercise program and returned demonstration. Patient ambulated around and over obstacles and performed high level gait activities including side stepping, backwards walking, walking with head turns, varying speeds, starts and stops independently without assistive device. Discussed with patient activity at home as  well as recommendation to use quad cane in community and on uneven surfaces. Patient had no further questions regarding education for discharge.   See FIM for current functional status  [name] 11/13/2012, 10:42 AM

## 2012-11-13 NOTE — Progress Notes (Signed)
Occupational Therapy Session Note  Patient Details  Name: Desiree Mitchell MRN: [id number] Date of Birth: April 04, 1956  Today's Date: 11/13/2012 Time: 1000-1115 Time Calculation (min): 75 min   Skilled Therapeutic Interventions/Progress Updates:    Pt seen this session to prepare for discharge by focusing on HEP and LUE strengthening.  She had completed all bathing and dressing this am with mod I. Pt ambulated to gym to work on UBE for 15 min, completed HEP, pt provided with a handout, provided with theraputty.  She worked on additional exercises in supine to increase shoulder flexion and tricep extension.  Pt then ambulated back to room with mod I.  Pt is now mod I in the room and ready for discharge tomorrow am.  Therapy Documentation Precautions:  Precautions Precautions: Fall Restrictions Weight Bearing Restrictions: No   Pain: Pain Assessment Pain Assessment: No/denies pain Pain Score: 0-No pain  ADL:  See FIM for current functional status  Therapy/Group: Individual Therapy  SAGUIER,JULIA 11/13/2012, 11:52 AM

## 2012-11-13 NOTE — Discharge Summary (Signed)
Desiree Mitchell, Desiree Mitchell NO.:  [id number]  MEDICAL RECORD NO.:  [id number]  LOCATION:  4005                         FACILITY:  [facility]  PHYSICIAN:  Desiree Mitchell, P.A.  DATE OF BIRTH:  [birth year]  DATE OF ADMISSION:  11/09/2012 DATE OF DISCHARGE:                              DISCHARGE SUMMARY   DISCHARGE DIAGNOSES:  Right thrombotic cerebrovascular accident, subcutaneous Lovenox for deep vein thrombosis prophylaxis, history of ventriculoperitoneal shunt with hydrocephalus, history of breast cancer with bilateral mastectomy, chemotherapy, history of asthma, hypertension, hyperlipidemia.  HISTORY:  This is a 57 year old right-handed female, history of breast cancer, status post bilateral mastectomy, chemotherapy, currently in remission and not receiving treatment as well as history of hypertension and hydrocephalus with VP shunt x10 years who was admitted November 05, 2012, with left-sided weakness.  Cranial CT scan showed diffuse white matter disease, left parietal ventriculostomy shunt in place.  No acute abnormalities.  MRI of the brain, not completed secondary to history of VP shunt.  The patient did not receive tPA.  Echocardiogram with ejection fraction of 65%, grade 1 diastolic dysfunction.  Venous Doppler studies of lower extremities negative for DVT.  Carotid Dopplers with no ICA stenosis.  Neurology Service was consulted, maintained on aspirin therapy for stroke prophylaxis as well as subcutaneous Lovenox. Physical and occupational therapy ongoing.  The patient was admitted for comprehensive rehab program.  PAST MEDICAL HISTORY:  See discharge diagnoses.  SOCIAL HISTORY:  Lives with family.  FUNCTIONAL HISTORY:  Prior to admission was independent.  Retired on disability.  Functional status upon admission to rehab services was minimal assist to ambulate 250 feet.  PHYSICAL EXAMINATION:  VITAL SIGNS:  Blood pressure 165/92, pulse 63, temperature 97.9,  respirations 17. GENERAL:  This was an alert female. HEENT:  Pupils round and reactive to light.  Oriented x3.  Mild dysarthric speech, but fully intelligible.  Left facial weakness. LUNGS:  Clear to auscultation. CARDIAC:  Rate controlled. ABDOMEN:  Soft, nontender.  Good bowel sounds.  REHABILITATION HOSPITAL COURSE:  The patient was admitted to inpatient rehab services with therapies initiated on a 3-hour daily basis consisting of physical therapy, occupational therapy, speech therapy, and rehabilitation nursing.  The following issues were addressed during the patient's rehabilitation stay. Pertaining to Desiree Mitchell's right thrombotic CVA remained stable, maintained on aspirin therapy.  Subcutaneous Lovenox ongoing for DVT prophylaxis with no bleeding episodes.  She had a history of a VP shunt with hydrocephalus x10 years.  An MRI was not completed during workup of stroke secondary to history of VP shunt, history of breast cancer with bilateral mastectomy, chemotherapy currently in remission not receiving any treatment.  She had been seen by [facility] in the past.  Attempts were made to obtain records in regard to her followup.  She did have a history of asthma with no shortness of breath.  She remained in a Combivent inhaler as needed.  Blood pressures controlled with Toprol 50 mg daily.  She would need follow up with her primary MD.  She continued on Zocor for hyperlipidemia.  The patient received weekly collaborative interdisciplinary team conferences to discuss estimated length of stay, family teaching, and  any barriers to discharge.  She was continent of bowel and bladder.  Supervision overall for activities of daily living, supervision to occasional minimal assist for functional mobility, overall she made steady gains and was encouraged with overall progress.  She was to be discharged to home with friend after family teaching completed with  progressive gains.  DISCHARGE MEDICATIONS: 1. Aspirin 325 mg p.o. daily. 2. Hydrocodone 1 tablet every 6 hours as needed for moderate pain. 3. Toprol-XL 50 mg p.o. daily. 4. Protonix 40 mg p.o. daily. 5. Zocor 40 mg p.o. daily.  DIET:  Regular.  SPECIAL INSTRUCTIONS:  The patient would follow up with Dr. Claudette Mitchell at the outpatient rehab center December 04, 2012.  Dr. Delia Mitchell, Neurology Services in 1 month.  Call for appointment. Arrangements were to be made to follow up with her primary MD upon discharge.  Ongoing therapies were dictated as per rehab services.     Desiree Mitchell, P.A.     DA/MEDQ  D:  11/13/2012  T:  11/13/2012  Job:  [id number]  cc:   Desiree P. Pearlean Brownie, MD

## 2012-11-13 NOTE — Progress Notes (Signed)
Occupational Therapy Discharge Summary  Patient Details  Name: Desiree Mitchell MRN: [id number] Date of Birth: 08-Oct-1955  Today's Date: 11/13/2012  Patient has met 12 of 12 long term goals due to improved activity tolerance, improved balance, ability to compensate for deficits, functional use of  LEFT upper and LEFT lower extremity and improved coordination.  Patient to discharge at overall Modified Independent level.    Reasons goals not met: n/a  Recommendation:  Patient will benefit from ongoing skilled OT services in home health setting to continue to advance functional skills in the area of iADL.  Equipment: tub bench  Reasons for discharge: treatment goals met  Patient/family agrees with progress made and goals achieved: Yes  OT Discharge ADL  mod I with tub transfers and bathing, I with dressing, toileting, grooming, eating Vision/Perception  Vision - Assessment Eye Alignment: Within Functional Limits Perception Perception: Within Functional Limits Praxis Praxis: Intact  Cognition Overall Cognitive Status: Appears within functional limits for tasks assessed Orientation Level: Oriented X4 Sensation Sensation Light Touch: Appears Intact Stereognosis: Appears Intact Hot/Cold: Appears Intact Proprioception: Appears Intact Coordination Gross Motor Movements are Fluid and Coordinated: Yes Fine Motor Movements are Fluid and Coordinated: Yes Motor  Motor Motor: Hemiplegia Mobility  Bed Mobility Bed Mobility: Rolling Right;Rolling Left;Supine to Sit;Sit to Supine Rolling Right: 7: Independent Rolling Left: 7: Independent Supine to Sit: 7: Independent Sitting - Scoot to Edge of Bed: 7: Independent Sit to Supine: 7: Independent Transfers Sit to Stand: 7: Independent Stand to Sit: 7: Independent  Trunk/Postural Assessment  Cervical Assessment Cervical Assessment: Within Functional Limits Thoracic Assessment Thoracic Assessment: Within Functional Limits Lumbar  Assessment Lumbar Assessment: Within Functional Limits Postural Control Postural Control: Within Functional Limits  Balance Standardized Balance Assessment Standardized Balance Assessment: Dynamic Gait Index Dynamic Gait Index Level Surface: Mild Impairment Change in Gait Speed: Mild Impairment Gait with Horizontal Head Turns: Normal Gait with Vertical Head Turns: Normal Gait and Pivot Turn: Mild Impairment Step Over Obstacle: Normal Step Around Obstacles: Normal Steps: Mild Impairment Total Score: 20 Static Sitting Balance Static Sitting - Level of Assistance: 7: Independent Dynamic Sitting Balance Dynamic Sitting - Level of Assistance: 7: Independent Static Standing Balance Static Standing - Level of Assistance: 7: Independent Dynamic Standing Balance Dynamic Standing - Level of Assistance: 6: Modified independent (Device/Increase time) Extremity/Trunk Assessment RUE Assessment RUE Assessment: Within Functional Limits LUE AROM (degrees) Left Shoulder Flexion: 50 Degrees Left Elbow Flexion: 130 LUE Strength Gross Grasp: Functional (10lb grasp strength (versus 60 on right))  See FIM for current functional status  SAGUIER,JULIA 11/13/2012, 11:49 AM

## 2012-11-13 NOTE — Progress Notes (Signed)
Physical Therapy Session Note  Patient Details  Name: Desiree Mitchell MRN: [id number] Date of Birth: 05-17-1956  Today's Date: 11/13/2012 Time: 1500-1540 Time Calculation (min): 40 min Missed 20 minutes secondary to patient unwilling to participate for remainder of session  Short Term Goals: Week 1:  PT Short Term Goal 1 (Week 1): STG's = LTG's due to LOS  Skilled Therapeutic Interventions/Progress Updates:    Patient received supine in bed, asleep. Patient requires encouragement to participate in therapy, states she is very tired. This session focused on static and dynamic balance tasks (see details of BERG Balance Test below) and gait training (see details below). Patient unwilling to participate in remainder of session, stating she is "worn out" and has "done a lot of activity today". Attempted to engage patient in NuStep exercise, [place] exercises, and high level mat activities, but patient declines to participate in any and requests to return to room. Patient left in room sitting edge of bed with all needs within reach.  Therapy Documentation Precautions:  Precautions Precautions: None Restrictions Weight Bearing Restrictions: No Pain: Pain Assessment Pain Assessment: No/denies pain Pain Score: 0-No pain Locomotion : Ambulation Ambulation: Yes Ambulation/Gait Assistance: 6: Modified independent (Device/Increase time) Ambulation Distance (Feet): 200 Feet Assistive device: None Gait Gait: Yes Gait Pattern: Within Functional Limits Gait Pattern: Step-through pattern;Decreased weight shift to left;Decreased hip/knee flexion - left  Balance: Balance Balance Assessed: Yes Standardized Balance Assessment Standardized Balance Assessment: Berg Balance Test Berg Balance Test Sit to Stand: Able to stand without using hands and stabilize independently Standing Unsupported: Able to stand safely 2 minutes Sitting with Back Unsupported but Feet Supported on Floor or Stool: Able to sit  safely and securely 2 minutes Stand to Sit: Sits safely with minimal use of hands Transfers: Able to transfer safely, minor use of hands Standing Unsupported with Eyes Closed: Able to stand 10 seconds safely Standing Ubsupported with Feet Together: Able to place feet together independently and stand 1 minute safely From Standing, Reach Forward with Outstretched Arm: Can reach confidently >25 cm (10") From Standing Position, Pick up Object from Floor: Able to pick up shoe safely and easily From Standing Position, Turn to Look Behind Over each Shoulder: Looks behind from both sides and weight shifts well Turn 360 Degrees: Able to turn 360 degrees safely in 4 seconds or less Standing Unsupported, Alternately Place Feet on Step/Stool: Able to stand independently and safely and complete 8 steps in 20 seconds Standing Unsupported, One Foot in Front: Able to place foot tandem independently and hold 30 seconds Standing on One Leg: Able to lift leg independently and hold equal to or more than 3 seconds Total Score: 54/56, indicating patient is at a low risk for falls (>25% risk)  See FIM for current functional status  Therapy/Group: Individual Therapy  Chipper Herb. Lizzie Cokley, PT, DPT  11/13/2012, 3:50 PM

## 2012-11-14 MED ORDER — HEPARIN SOD (PORK) LOCK FLUSH 100 UNIT/ML IV SOLN
500.0000 [IU] | INTRAVENOUS | Status: AC | PRN
  Administered 2012-11-14: 500 [IU]

## 2012-11-14 NOTE — Progress Notes (Signed)
Patient discharged about 1420 to sisters home with adopted sister. Patient discharged with all belongings and discharge instructions via Harvel Ricks PA given yesterday. Went over medications and follow up appointments with patient. Patient verbalized understanding. Patient denied any pain. Patient left with ordered tub bench. No complication noted. Patient stable.

## 2012-11-14 NOTE — Discharge Summary (Signed)
Mitchell, Desiree Mitchell NO.:  [id number]  MEDICAL RECORD NO.:  [id number]  LOCATION:  4005                         FACILITY:  [facility]  PHYSICIAN:  Erick Colace, Desiree.D.DATE OF BIRTH:  12-14-1955  DATE OF ADMISSION:  11/09/2012 DATE OF DISCHARGE:  11/13/2012                              DISCHARGE SUMMARY   DISCHARGE DIAGNOSES: 1. Right thrombotic cerebrovascular accident. 2. Subcutaneous Lovenox for deep venous thrombosis prophylaxis. 3. History of ventriculoperitoneal shunt, hydrocephalus x10 years. 4. History of breast cancer, bilateral mastectomy, chemotherapy. 5. History of asthma. 6. Hypertension. 7. Hyperlipidemia.  HISTORY OF PRESENT ILLNESS:  This is a 57 year old right-handed female with history of breast cancer.  Bilateral mastectomies, chemotherapy, currently in remission, as well as cerebral aneurysm repair x10 years who was admitted on November 05, 2012, with left-sided weakness.  Cranial CT scan with diffuse white matter disease, left parietal ventriculostomy shunt in place.  No acute intracranial abnormalities.  MRI of the brain could not be completed due to VP shunt.  The patient did not receive tPA.  AUTHOR REQUESTS TO OMIT THIS DICTATION.     Mariam Dollar, P.A.   ______________________________ Erick Colace, Desiree.D.    DA/MEDQ  D:  11/13/2012  T:  11/13/2012  Job:  [id number]  cc:   Pramod P. Pearlean Brownie, MD

## 2012-11-14 NOTE — Progress Notes (Signed)
Patient ID: Desiree Mitchell, female   DOB: February 15, 1956, 57 y.o.   MRN: [id number] Subjective/Complaints: 57 y.o. right-handed female with history of breast cancer(status post bilateral mastectomy chemotherapy, currently in remission and not receiving any treatment), hypertension, hydrocephalus(cerebral aneurysm repair) with VP shunt placement x10 years. Admitted 11/05/2012 oh left-sided weakness. Cranial CT scan with diffuse white matter disease, left parietal ventriculostomy shunt in place. No acute intracranial abnormality noted. MRI of the brain completed a history of VP shunt. Patient did not receive TPA. Echocardiogram with ejection fraction of 65% grade 1 diastolic dysfunction. Venous Doppler studies of lower extremities negative for DVT. Carotid Dopplers with no ICA stenosis. Neurology services consulted presently on aspirin therapy for stroke prophylaxis as well as subcutaneous Lovenox.  Muscle soreness upper thigh Review of Systems  Eyes: Negative for blurred vision and double vision.  Neurological: Positive for sensory change and focal weakness. Negative for speech change and seizures.  All other systems reviewed and are negative.    Objective: Vital Signs: Blood pressure 155/82, pulse 73, temperature 97.6 F (36.4 C), temperature source Oral, resp. rate 18, height 5\' 1"  (1.549 m), weight 94.348 kg (208 lb), SpO2 98.00%. No results found. No results found for this or any previous visit (from the past 72 hour(s)).   HEENT: normal Cardio: RRR Resp: CTA B/L GI: BS positive Extremity:  Pulses positive and No Edema Skin:   Intact Neuro: Alert/Oriented, Cranial Nerve II-XII normal, Abnormal Sensory reduced pinprick LUE and LLE , Abnormal Motor 3-/5 L delt Bi tri grip HF 3-/5 Knee ext and ankle DF/PF, Abnormal FMC Ataxic/ dec FMC and Tone  Within Normal Limits, Tone:  Within Normal Limits and Other No aphasia or dysarthria Musc/Skel:  Normal Gen NAD BLE no swelling, decreased sensory only  on Left , no joint swelling, no calf or thigh tenderness  Assessment/Plan: 1. Functional deficits secondary to brainstem infarct R pontine Stable for D/C today F/u PCP in 1-2 weeks F/u PM&R 3 weeks See D/C summary See D/C instructions  FIM: FIM - Bathing Bathing Steps Patient Completed: Chest;Right Arm;Left Arm;Abdomen;Left upper leg;Right upper leg;Buttocks;Front perineal area;Right lower leg (including foot);Left lower leg (including foot) Bathing: 7: Complete Independence: No helper  FIM - Upper Body Dressing/Undressing Upper body dressing/undressing steps patient completed: Thread/unthread right sleeve of pullover shirt/dresss;Thread/unthread left sleeve of pullover shirt/dress;Put head through opening of pull over shirt/dress;Pull shirt over trunk Upper body dressing/undressing: 7: Complete Independence: No helper FIM - Lower Body Dressing/Undressing Lower body dressing/undressing steps patient completed: Thread/unthread right underwear leg;Thread/unthread left underwear leg;Pull underwear up/down;Thread/unthread right pants leg;Thread/unthread left pants leg;Pull pants up/down;Don/Doff left shoe;Don/Doff right shoe Lower body dressing/undressing: 7: Complete Independence: No helper  FIM - Toileting Toileting steps completed by patient: Adjust clothing prior to toileting;Performs perineal hygiene;Adjust clothing after toileting Toileting: 7: Independent: No helper, no device  FIM - Diplomatic Services operational officer Devices: Grab bars Toilet Transfers: 7-Independent: No helper  FIM - Banker Devices: Bed rails Bed/Chair Transfer: 7: Independent: No helper  FIM - Locomotion: Wheelchair Locomotion: Wheelchair: 0: Activity did not occur FIM - Locomotion: Ambulation Locomotion: Ambulation Assistive Devices: Occupational hygienist Ambulation/Gait Assistance: 6: Modified independent (Device/Increase time) Locomotion: Ambulation: 6: Travels 150  ft or more with assistive device/no helper  Comprehension Comprehension Mode: Auditory Comprehension: 7-Follows complex conversation/direction: With no assist  Expression Expression Mode: Verbal Expression: 7-Expresses complex ideas: With no assist  Social Interaction Social Interaction: 6-Interacts appropriately with others with medication or extra time (anti-anxiety, antidepressant).  Problem Solving Problem Solving: 6-Solves complex problems: With extra time  Memory Memory: 7-Complete Independence: No helper  Medical Problem List and Plan:  1. 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 DVT Prophylaxis/Anticoagulation: Subcutaneous Lovenox. Monitor platelet counts any signs of bleeding. Recheck in the am  3. Pain Management: Ultram as needed. Monitor with increased mobility Leg pain exam unremarkable, moniotr 4. Neuropsych: This patient is capable of making decisions on his/her own behalf.  5. History of VP shunt/hydrocephalus x10 years  6. History of breast cancer status post bilateral mastectomy chemotherapy. Currently in remission and not receiving any treatment. Question whether patient has had bilateral axillary dissection. Will check with her surgeon at [facility]. She is getting blood pressures checked in both legs which may or may not be accurate 7. History of asthma. Combivent inhaler as needed. Denies any shortness of breath  8. Hypertension. Toprol-XL 50 mg daily. Monitor with increased mobility and adjust as indicated  9. Hyperlipidemia. Zocor    LOS (Days) 5 A FACE TO FACE EVALUATION WAS PERFORMED  Krishav Mamone E 11/14/2012, 7:08 AM

## 2012-11-18 ENCOUNTER — Telehealth: Payer: Self-pay

## 2012-11-18 NOTE — Telephone Encounter (Signed)
[facility] called to get verbal order to do home health occupational therapy.  Verbal given.  Patient has a follow up appointment.

## 2012-12-04 ENCOUNTER — Encounter: Payer: Self-pay | Admitting: Physical Medicine & Rehabilitation

## 2012-12-04 ENCOUNTER — Ambulatory Visit (HOSPITAL_BASED_OUTPATIENT_CLINIC_OR_DEPARTMENT_OTHER): Payer: Medicaid Other | Admitting: Physical Medicine & Rehabilitation

## 2012-12-04 ENCOUNTER — Encounter: Payer: Medicaid Other | Attending: Physical Medicine & Rehabilitation

## 2012-12-04 VITALS — BP 214/125 | HR 77 | Resp 14 | Ht 61.0 in | Wt 212.0 lb

## 2012-12-04 DIAGNOSIS — M7502 Adhesive capsulitis of left shoulder: Secondary | ICD-10-CM

## 2012-12-04 DIAGNOSIS — M6281 Muscle weakness (generalized): Secondary | ICD-10-CM

## 2012-12-04 DIAGNOSIS — Z9221 Personal history of antineoplastic chemotherapy: Secondary | ICD-10-CM | POA: Insufficient documentation

## 2012-12-04 DIAGNOSIS — Z853 Personal history of malignant neoplasm of breast: Secondary | ICD-10-CM | POA: Insufficient documentation

## 2012-12-04 DIAGNOSIS — Z982 Presence of cerebrospinal fluid drainage device: Secondary | ICD-10-CM | POA: Insufficient documentation

## 2012-12-04 DIAGNOSIS — R531 Weakness: Secondary | ICD-10-CM

## 2012-12-04 DIAGNOSIS — M7500 Adhesive capsulitis of unspecified shoulder: Secondary | ICD-10-CM

## 2012-12-04 DIAGNOSIS — R29898 Other symptoms and signs involving the musculoskeletal system: Secondary | ICD-10-CM | POA: Insufficient documentation

## 2012-12-04 DIAGNOSIS — Z9010 Acquired absence of unspecified breast and nipple: Secondary | ICD-10-CM | POA: Insufficient documentation

## 2012-12-04 NOTE — Progress Notes (Signed)
This is a 57 year old right-handed female  with history of breast cancer. Bilateral mastectomies, chemotherapy,  currently in remission, as well as cerebral aneurysm repair x10 years  who was admitted on November 05, 2012, with left-sided weakness. Cranial  CT scan with diffuse white matter disease, left parietal ventriculostomy  shunt in place. No acute intracranial abnormalities Home health therapy will be finishing next week, PT and OT  Chief complaint is left arm weakness. Upper part of left leg feels heavy  Has not seen primary care physician. She will be seeing family practice will call on May 1 to set up appointment.  No falls at home. Patient is dressing  and bathing  Examination Gen. No acute distress Mood and affect Are flat  Motor strength is 3 minus at the left deltoid, biceps, triceps, grip 4+ left hip flexor knee extensor ankle dorsiflexor plantar flexor 5/5 in the same muscle groups on the right side Range of motion reduced with shoulder abduction and external rotation on the left side only. Ambulates without assistive device. Slow step length widened base of support.   Impression Left-sided weakness some of this appears to be chronic and more related to range of motion restrictions at the left shoulder. 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 In addition she's had history of cerebral aneurysm repair 10 years ago and left parietal ventriculostomy shunt. Will refer to [facility] for further therapy. RTC 1 month

## 2012-12-04 NOTE — Progress Notes (Signed)
  Subjective:    Patient ID: Desiree Mitchell, female    DOB: 07-Nov-1955, 57 y.o.   MRN: [id number]  HPI  Pain Inventory Average Pain 5 Pain Right Now 5 My pain is dull  In the last 24 hours, has pain interfered with the following? General activity 5 Relation with others 5 Enjoyment of life 7 What TIME of day is your pain at its worst? evening Sleep (in general) Good  Pain is worse with: walking Pain improves with: medication Relief from Meds: 5  Mobility walk without assistance use a cane how many minutes can you walk? 30 transfers alone Do you have any goals in this area?  yes  Function disabled: date disabled . I need assistance with the following:  feeding, dressing, bathing, toileting, meal prep, household duties and shopping Do you have any goals in this area?  yes  Neuro/Psych anxiety  Prior Studies Any changes since last visit?  no  Physicians involved in your care Any changes since last visit?  no   Family History  Problem Relation Age of Onset  . Cancer Mother   . Diabetes Father   . Cancer Father    History   Social History  . Marital Status: Widowed    Spouse Name: N/A    Number of Children: N/A  . Years of Education: N/A   Social History Main Topics  . Smoking status: Never Smoker   . Smokeless tobacco: None  . Alcohol Use: Yes  . Drug Use: None  . Sexually Active: None   Other Topics Concern  . None   Social History Narrative  . None   Past Surgical History  Procedure Laterality Date  . Brain surgery      stent in head   Past Medical History  Diagnosis Date  . Hypertension   . Breast cancer   . Hydrocephalus   . Asthma   . Stroke    BP 214/125  Pulse 77  Resp 14  Ht 5\' 1"  (1.549 m)  Wt 212 lb (96.163 kg)  BMI 40.08 kg/m2  SpO2 97%     Review of Systems  Constitutional: Positive for unexpected weight change.  Gastrointestinal: Positive for diarrhea.  Psychiatric/Behavioral: The patient is nervous/anxious.   All  other systems reviewed and are negative.       Objective:   Physical Exam        Assessment & Plan:

## 2012-12-21 ENCOUNTER — Ambulatory Visit: Payer: Medicaid Other | Attending: Physical Medicine & Rehabilitation | Admitting: Physical Therapy

## 2012-12-21 DIAGNOSIS — R269 Unspecified abnormalities of gait and mobility: Secondary | ICD-10-CM | POA: Insufficient documentation

## 2012-12-21 DIAGNOSIS — IMO0001 Reserved for inherently not codable concepts without codable children: Secondary | ICD-10-CM | POA: Insufficient documentation

## 2012-12-21 DIAGNOSIS — M6281 Muscle weakness (generalized): Secondary | ICD-10-CM | POA: Insufficient documentation

## 2013-01-05 ENCOUNTER — Ambulatory Visit: Payer: Medicaid Other | Admitting: Physical Medicine & Rehabilitation

## 2013-01-06 ENCOUNTER — Ambulatory Visit: Payer: Medicaid Other | Admitting: Physical Therapy

## 2013-04-06 ENCOUNTER — Ambulatory Visit: Payer: Self-pay | Attending: Internal Medicine | Admitting: Internal Medicine

## 2013-04-06 MED ORDER — SIMVASTATIN 40 MG PO TABS: 40.0000 mg | ORAL_TABLET | Freq: Every day | ORAL | Status: DC

## 2013-04-06 MED ORDER — PANTOPRAZOLE SODIUM 40 MG PO TBEC: 40.0000 mg | DELAYED_RELEASE_TABLET | Freq: Every day | ORAL | Status: DC

## 2013-04-06 MED ORDER — IPRATROPIUM-ALBUTEROL 18-103 MCG/ACT IN AERO: 2.0000 | INHALATION_SPRAY | Freq: Four times a day (QID) | RESPIRATORY_TRACT | Status: DC | PRN

## 2013-04-06 MED ORDER — METOPROLOL SUCCINATE ER 50 MG PO TB24: 50.0000 mg | ORAL_TABLET | Freq: Every morning | ORAL | Status: DC

## 2013-04-06 MED ORDER — CLONIDINE HCL 0.2 MG PO TABS: 0.2000 mg | ORAL_TABLET | Freq: Once | ORAL | Status: DC

## 2013-04-06 NOTE — Progress Notes (Unsigned)
Patient ID: Desiree Mitchell, female   DOB: 09/01/1955, 57 y.o.   MRN: [id number]  CC:  HPI: 57 y.o. right-handed female with history of breast cancer(status post bilateral mastectomy chemotherapy, currently in remission and not receiving any treatment), hypertension, hydrocephalus(cerebral aneurysm repair) with VP shunt placement x10 years. Admitted 11/05/2012 oh left-sided weakness. Cranial CT scan with diffuse white matter disease, left parietal ventriculostomy shunt in place. No acute intracranial abnormality noted. MRI of the brain completed a history of VP shunt. Patient did not receive TPA. Echocardiogram with ejection fraction of 65% grade 1 diastolic dysfunction. Venous Doppler studies of lower extremities negative for DVT. Carotid Dopplers with no ICA stenosis Patient has regained full use of her left arm and her left leg. She is ambulating without any cane or walker. She has completed home health physical therapy.  Unfortunately she is hypertensive today with systolic blood pressure in the 180s. She ran out of her metoprolol a week ago She is a nonsmoker She is followed in [place] by an oncologist for her breast cancer She states that she is in remission  Allergies  Allergen Reactions  . Tape     blisters   Past Medical History  Diagnosis Date  . Hypertension   . Breast cancer   . Hydrocephalus   . Asthma   . Stroke    Current Outpatient Prescriptions on File Prior to Visit  Medication Sig Dispense Refill  . acetaminophen (TYLENOL) 325 MG tablet Take 650 mg by mouth every 6 (six) hours as needed for pain.      Marland Kitchen aspirin 325 MG tablet Take 1 tablet (325 mg total) by mouth daily.  60 tablet  0  . HYDROcodone-acetaminophen (NORCO/VICODIN) 5-325 MG per tablet Take 1 tablet by mouth every 6 (six) hours as needed.  30 tablet  0   No current facility-administered medications on file prior to visit.   Family History  Problem Relation Age of Onset  . Cancer Mother   . Diabetes  Father   . Cancer Father    History   Social History  . Marital Status: Widowed    Spouse Name: N/A    Number of Children: N/A  . Years of Education: N/A   Occupational History  . Not on file.   Social History Main Topics  . Smoking status: Never Smoker   . Smokeless tobacco: Not on file  . Alcohol Use: Yes  . Drug Use: Not on file  . Sexual Activity: Not on file   Other Topics Concern  . Not on file   Social History Narrative  . No narrative on file    Review of Systems  Constitutional: Negative for fever, chills, diaphoresis, activity change, appetite change and fatigue.  HENT: Negative for ear pain, nosebleeds, congestion, facial swelling, rhinorrhea, neck pain, neck stiffness and ear discharge.   Eyes: Negative for pain, discharge, redness, itching and visual disturbance.  Respiratory: Negative for cough, choking, chest tightness, shortness of breath, wheezing and stridor.   Cardiovascular: Negative for chest pain, palpitations and leg swelling.  Gastrointestinal: Negative for abdominal distention.  Genitourinary: Negative for dysuria, urgency, frequency, hematuria, flank pain, decreased urine volume, difficulty urinating and dyspareunia.  Musculoskeletal: Negative for back pain, joint swelling, arthralgias and gait problem.  Neurological: Negative for dizziness, tremors, seizures, syncope, facial asymmetry, speech difficulty, weakness, light-headedness, numbness and headaches.  Hematological: Negative for adenopathy. Does not bruise/bleed easily.  Psychiatric/Behavioral: Negative for hallucinations, behavioral problems, confusion, dysphoric mood, decreased concentration and agitation.  Objective:   Filed Vitals:   04/06/13 1123  BP: 183/70  Pulse: 98  Temp: 98 F (36.7 C)  Resp: 18    Physical Exam  Constitutional: Appears well-developed and well-nourished. No distress.  HENT: Normocephalic. External right and left ear normal. Oropharynx is clear and  moist.  Eyes: Conjunctivae and EOM are normal. PERRLA, no scleral icterus.  Neck: Normal ROM. Neck supple. No JVD. No tracheal deviation. No thyromegaly.  CVS: RRR, S1/S2 +, no murmurs, no gallops, no carotid bruit.  Pulmonary: Effort and breath sounds normal, no stridor, rhonchi, wheezes, rales.  Abdominal: Soft. BS +,  no distension, tenderness, rebound or guarding.  Musculoskeletal: Normal range of motion. No edema and no tenderness.  Lymphadenopathy: No lymphadenopathy noted, cervical, inguinal. Neuro: Alert. Normal reflexes, muscle tone coordination. No cranial nerve deficit. Skin: Skin is warm and dry. No rash noted. Not diaphoretic. No erythema. No pallor.  Psychiatric: Normal mood and affect. Behavior, judgment, thought content normal.   Lab Results  Component Value Date   WBC 7.7 11/10/2012   HGB 12.4 11/10/2012   HCT 37.7 11/10/2012   MCV 95.2 11/10/2012   PLT 310 11/10/2012   Lab Results  Component Value Date   CREATININE 0.98 11/10/2012   BUN 17 11/10/2012   NA 144 11/10/2012   K 4.0 11/10/2012   CL 108 11/10/2012   CO2 28 11/10/2012    Lab Results  Component Value Date   HGBA1C 6.1* 11/06/2012   Lipid Panel     Component Value Date/Time   CHOL 180 11/06/2012 0640   TRIG 162* 11/06/2012 0640   HDL 39* 11/06/2012 0640   CHOLHDL 4.6 11/06/2012 0640   VLDL 32 11/06/2012 0640   LDLCALC 109* 11/06/2012 0640       Assessment and plan:   Patient Active Problem List   Diagnosis Date Noted  . CVA (cerebral vascular accident) 11/10/2012  . Weakness of left side of body 11/05/2012  . HTN (hypertension) 11/05/2012  . 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 Obesity 11/05/2012  . Asthma 11/05/2012    Hypertension Refilled metoprolol, we'll have her return in 3 days for a recheck Given one dose of clonidine 0.2 now   Asthma Refill albuterol  Dyslipidemia Refilled Zocor  Breast cancer in remission  Patient to return in 3 days for a blood pressure check She is also to return  in one month for repeat blood work, lipid panel, CMP, CBC at which time the patient would need an office appointment        The patient was given clear instructions to go to ER or return to medical center if symptoms don't improve, worsen or new problems develop. The patient verbalized understanding. The patient was told to call to get any lab results if not heard anything in the next week.

## 2013-04-06 NOTE — Progress Notes (Unsigned)
Patient here for hospital follow up Recently had a stroke Has history of bilateral breast cancer

## 2013-04-09 ENCOUNTER — Ambulatory Visit: Payer: Self-pay | Attending: Internal Medicine | Admitting: Pharmacist

## 2013-04-09 VITALS — BP 160/92 | HR 62 | Temp 98.0°F | Resp 15

## 2013-04-09 DIAGNOSIS — Z0000 Encounter for general adult medical examination without abnormal findings: Secondary | ICD-10-CM

## 2013-04-09 NOTE — Progress Notes (Signed)
Patient was seen for blood pressure check only 160/92 pulse 62

## 2014-12-04 NOTE — Consult Note (Signed)
Reason for Visit: This 59 year old Female patient presents to the clinic for initial evaluation of  Breast cancer .   Referred by Dr. Ma Hillock.  Diagnosis:   Chief Complaint/Diagnosis   59 year old female with prior history of right breast cancer 20 years prior now with fungating T4 B. mass of the left breast status post neoadjuvant chemotherapy with complete response status post bilateral mastectomies with no residual tumor no axillary lymph nodes on pathologic examination.   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 She was started on neoadjuvant chemotherapy with Choksi Taxol and Cytoxan. She then underwent bilateral mastectomies and axillary lymph node dissection. Only granulation tissue and fibrosis was seen no active tumor was identified. 10 axillary lymph nodes were identified none showing metastatic disease. Patient has done well postoperatively.she has been presented at our tumor conference and recommendation for adjuvant radiation therapy was made. She is having no significant lymphedema of left upper extremity this time. Her mastectomy scars are well-healed.  Past Hx:    Bronchial asthma:    Obesity:    HTN:    Aneurysm:    Right breast Lumpectomy: 1991   Right chest Port A Cath: 23-Jan-2011   I & D left breast: May 2012   VP shunt: 2007  Past, Family and Social History:   Past Medical History positive    Cardiovascular hypertension     Past Surgical History History of head injury with intracranial shot which was read diverted at the time of mastectomy., Right lumpectomy 1991    Family History positive    Family History Comments Patient's mother with breast cancer at age 3, family history stomach cancer and hypertension    Social History noncontributory    Additional Past Medical and Surgical History Seen by herself today   Allergies:   Cephalexin: Itching, Blisters, Rash  Tape: Blisters  Home Meds:  Home Medications: Medication Instructions Status  Metoprolol Succinate ER 25 mg oral tablet, extended release 1 tab(s) orally once a day (in the morning) Active  Combivent 18 mcg-103 mcg-/inh inhalation aerosol 1 puff(s) inhaled , As Needed Active   Review of Systems:   General negative    Performance Status (ECOG) 0    Skin negative    Breast see HPI    Ophthalmologic negative    ENMT negative    Respiratory and Thorax negative    Cardiovascular negative    Gastrointestinal negative    Musculoskeletal negative    Neurological see HPI    Psychiatric negative    Hematology/Lymphatics negative    Endocrine negative    Allergic/Immunologic negative   Nursing Notes:  Nursing Vital Signs and Chemo Nursing Nursing Notes: *CC Vital Signs Flowsheet:   19-Feb-13 11:36   Temp Temperature 97.3   Pulse Pulse 96   Respirations Respirations 18   SBP SBP 161   DBP DBP 108   Current Weight (kg) (kg) 102   Height (cm) centimeters 157.4   Physical Exam:  General/Skin/HEENT:  General normal    Skin normal    Eyes normal    ENMT normal    Head and Neck normal    Additional PE Well-developed slightly obese female in NAD. She status post bilateral mastectomies. No chest wall mass or nodularity is identified. No axillary or supraclavicular adenopathy is noted. Lungs are clear to A&P cardiac examination shows regular rate and rhythm. Abdomen is benign.   Breasts/Resp/CV/GI/GU:   Respiratory  and Thorax normal    Cardiovascular normal    Gastrointestinal normal    Genitourinary normal   MS/Neuro/Psych/Lymph:   Musculoskeletal normal    Neurological normal    Lymphatics normal   Other Results:  Radiology Results: Nuclear Med:    06-Jun-12 11:55, PET/CT Scan Breast CA Stage/Restaging   PET/CT Scan Breast CA Stage/Restaging    REASON FOR EXAM:    left breast cancer  COMMENTS:       PROCEDURE: PET - PET/CT RESTG BREAST CA  - Jan 16 2011 11:55AM     RESULT: The patient is undergoing staging of breast malignancy. The   patient underwent biopsy 10 days ago on the left. The patient's fasting   blood glucose level was 100 mg/dL. The patient received 12.9 mCi of F-18   labeled FDG at 10:14 a.m. Scanning was then performed from 11:24 to 11:45   a.m. A noncontrast CT scan was performed at the same sitting for   coregistration attenuation correction.    Uptake of the radiopharmaceutical within the neck is normal.    The left breast is enlarged and there is a large mass associated with the     medial aspect of the breast. This mass avidly accumulates the   radiopharmaceutical. A central area of photopenia may reflect the   presence of necrosis. The maximal SUV value demonstrated here is 19 with   a mean of 16. I do not see abnormal uptake of the radiopharmaceutical   elsewhere in the left breast nor within the normal sized right breast. No   abnormal uptake in the axilla is demonstrated. Within the thoracic cavity   I see no abnormal uptake in the mediastinum or hilar regions nor   pulmonary parenchymal.    Within the abdomen and pelvis normal expected activity withinthe urinary   bladder and kidneys is seen. There is a tiny focus to the right of the   urinary bladder posteriorly in the pelvis which does not clearly   correspond to a lymph node. It exhibits SUV of 9.6 maximally and 6.7 as a   mean. This likely reflects activity within the distal ureter.    On the CT  images the thyroid gland is seen to be heterogeneous in   density. I see no mediastinal or hilar lymphadenopathy nor pleural nor   pericardial effusion. There are no adrenal masses. The kidney arenormal   in appearance. The liver and gallbladder exhibit no acute abnormality. I   see no periaortic or pericaval lymphadenopathy. There is no evidence of   ascites.    IMPRESSION:   1. There is markedly abnormal uptake within the known mass within the   left breast. I do not see definite abnormal uptake elsewhere within the   body. A tiny focus of increased uptake within the right aspect of the   pelvis is nonspecific and may be related to the distal right ureter.  2. I do not see evidence of acute abnormality on the CT images elsewhere   in the chest  or abdomen or pelvis. There is nodularity within the normal   size thyroid gland.    Verified By: DAVID A. 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  Plan:   at this time based on extremely large exophytic and ulcerative nature of her lesion as well as his poorly differentiated nature believe she would benefit from radiation therapy to her left chest wall and peripheral lymphatics. Would plan on delivering 5000 cGy to both sites. Would boost or scar another 1400 cGy using electrons. Risks and benefits of treatment were reviewed with the patient and she seems to comprehend our treatment plan well. Her case was discussed at our weekly tumor conference and adjuvant radiation therapy was agreed upon. I am using Allen N. guidelines for dose determination. I have set her up for CT simulation later this week.  I would like to take this opportunity to thank you for allowing me to continue to participate in this patient's care.  CC Referral:   cc: Dr. Leanora Cover    Electronic Signatures: Baruch Gouty, Roda Shutters (MD)  (Signed 19-Feb-13 15:47)  Authored: HPI, Diagnosis, Past Hx, PFSH, Allergies, Home Meds, ROS, Nursing Notes, Physical Exam, Other Results, Encounter Assessment and Plan, CC Referring Physician   Last Updated: 19-Feb-13 15:47 by Armstead Peaks (MD)

## 2015-01-16 ENCOUNTER — Encounter: Payer: Self-pay | Admitting: Emergency Medicine

## 2015-01-16 ENCOUNTER — Emergency Department
Admission: EM | Admit: 2015-01-16 | Discharge: 2015-01-16 | Disposition: A | Discharge status: Home / Self Care | Payer: Self-pay | Attending: Emergency Medicine | Admitting: Emergency Medicine

## 2015-01-16 DIAGNOSIS — K0889 Other specified disorders of teeth and supporting structures: Secondary | ICD-10-CM

## 2015-01-16 DIAGNOSIS — I10 Essential (primary) hypertension: Secondary | ICD-10-CM | POA: Insufficient documentation

## 2015-01-16 DIAGNOSIS — Z79899 Other long term (current) drug therapy: Secondary | ICD-10-CM | POA: Insufficient documentation

## 2015-01-16 DIAGNOSIS — Z7982 Long term (current) use of aspirin: Secondary | ICD-10-CM | POA: Insufficient documentation

## 2015-01-16 DIAGNOSIS — K088 Other specified disorders of teeth and supporting structures: Secondary | ICD-10-CM | POA: Insufficient documentation

## 2015-01-16 MED ORDER — KETOROLAC TROMETHAMINE 30 MG/ML IJ SOLN
60.0000 mg | Freq: Once | INTRAMUSCULAR | Status: AC
  Administered 2015-01-16: 60 mg via INTRAMUSCULAR

## 2015-01-16 MED ORDER — KETOROLAC TROMETHAMINE 60 MG/2ML IM SOLN
INTRAMUSCULAR | Status: AC
  Filled 2015-01-16: qty 2

## 2015-01-16 MED ORDER — METOPROLOL TARTRATE 50 MG PO TABS: 50.0000 mg | ORAL_TABLET | Freq: Every day | ORAL | Status: DC

## 2015-01-16 MED ORDER — IBUPROFEN 800 MG PO TABS: 800.0000 mg | ORAL_TABLET | Freq: Three times a day (TID) | ORAL | Status: DC | PRN

## 2015-01-16 MED ORDER — CLONIDINE HCL 0.2 MG PO TABS: 0.2000 mg | ORAL_TABLET | Freq: Two times a day (BID) | ORAL | Status: DC

## 2015-01-16 MED ORDER — HYDROCODONE-ACETAMINOPHEN 5-325 MG PO TABS: 1.0000 | ORAL_TABLET | ORAL | Status: DC | PRN

## 2015-01-16 MED ORDER — AMOXICILLIN 500 MG PO TABS: 500.0000 mg | ORAL_TABLET | Freq: Three times a day (TID) | ORAL | Status: DC

## 2015-01-16 NOTE — Discharge Instructions (Signed)
Dental Pain A tooth ache may be caused by cavities (tooth decay). Cavities expose the nerve of the tooth to air and hot or cold temperatures. It may come from an infection or abscess (also called a boil or furuncle) around your tooth. It is also often caused by dental caries (tooth decay). This causes the pain you are having. DIAGNOSIS  Your caregiver can diagnose this problem by exam. TREATMENT   If caused by an infection, it may be treated with medications which kill germs (antibiotics) and pain medications as prescribed by your caregiver. Take medications as directed.  Only take over-the-counter or prescription medicines for pain, discomfort, or fever as directed by your caregiver.  Whether the tooth ache today is caused by infection or dental disease, you should see your dentist as soon as possible for further care. SEEK MEDICAL CARE IF: The exam and treatment you received today has been provided on an emergency basis only. This is not a substitute for complete medical or dental care. If your problem worsens or new problems (symptoms) appear, and you are unable to meet with your dentist, call or return to this location. SEEK IMMEDIATE MEDICAL CARE IF:   You have a fever.  You develop redness and swelling of your face, jaw, or neck.  You are unable to open your mouth.  You have severe pain uncontrolled by pain medicine. MAKE SURE YOU:   Understand these instructions.  Will watch your condition.  Will get help right away if you are not doing well or get worse. Document Released: 07/29/2005 Document Revised: 10/21/2011 Document Reviewed: 03/16/2008 [facility] Patient Information 2015 [place], [place]. This information is not intended to replace advice given to you by your health care provider. Make sure you discuss any questions you have with your health care provider.  OPTIONS FOR DENTAL FOLLOW UP CARE  [place] [facility] and [place] [email or web address] [phone])  [facility] [phone])  [place] [phone])  [place] [phone])  [facility] [phone]) This clinic caters to the indigent population and is on a lottery system. Location: [employer] [facility], [place], [place], [place] Clinic Hours: Wednesdays from 6pm - 9pm, patients seen by a lottery system. For dates, call or go to [email or web address] Services: Cleanings, fillings and simple extractions. Payment Options: DENTAL WORK IS FREE OF CHARGE. Bring proof of income or support. Best way to get seen: Arrive at 5:15 pm - this is a lottery, NOT first come/first serve, so arriving earlier will not increase your chances of being seen.     [facility] [phone] Select option 1 for emergencies   Location: [facility], [place], [address] [place] Clinic Hours: No walk-ins accepted - call the day before to schedule an appointment. Check in times are 9:30 am and 1:30 pm. Services: Simple extractions, temporary fillings, pulpectomy/pulp debridement, uncomplicated abscess drainage. Payment Options: PAYMENT IS DUE AT THE TIME OF SERVICE.  Fee is usually $100-200, additional surgical procedures (e.g. abscess drainage) may be extra. Cash, checks, Visa/MasterCard accepted.  Can file Medicaid if patient is covered for dental - patient should call case worker to check. No discount for [facility] patients. Best way to get seen: MUST call the day before and get onto the schedule. Can usually be seen the next 1-2 days. No walk-ins accepted.  [place] [phone]   Location: [place], [place] Clinic Hours: M, W, Th, F 8am or 1:30pm, Tues  9a or 1:30 - first come/first served. Services: Simple extractions, temporary fillings, uncomplicated abscess drainage.  You do not need to be an [facility] resident. Payment Options: PAYMENT IS DUE AT THE TIME OF SERVICE. Dental insurance, otherwise sliding scale - bring proof of income or support. Depending on income and treatment needed, cost is usually $50-200. Best way to get seen: Arrive early as it is first come/first served.     [facility] [phone]   Location: [place] [facility] Hours: Mon-Thu 8a-5p Services: Most basic dental services including extractions and fillings. Payment Options: PAYMENT IS DUE AT THE TIME OF SERVICE. Sliding scale, up to 50% off - bring proof if income or support. Medicaid with dental option accepted. Best way to get seen: Call to schedule an appointment, can usually be seen within 2 weeks OR they will try to see walk-ins - show up at [place] or 2p (you may have to wait).     [facility] [place] RESIDENTS ONLY   Location: [facility], [place] [address] [place], [place] [address] Clinic Hours: By appointment only. Monday - Thursday 8am-5pm, Friday 8am-12pm Services: Cleanings, fillings, extractions. Payment Options: PAYMENT IS DUE AT THE TIME OF SERVICE. Cash, Visa or MasterCard. Sliding scale - $30 minimum per service. Best way to get seen: Come in to office, complete packet and make an appointment - need proof of income or support monies for each household member and proof of [facility] residence. Usually takes about a month to get in.     [facility] [phone]   Location: [address] [place] Clinic Hours: Walk-in Urgent Care Dental Services are offered Monday-Friday mornings only. The numbers of emergencies accepted daily is limited to the number of providers available. Maximum 15 -  Mondays, Wednesdays & Thursdays Maximum 10 - Tuesdays & Fridays Services: You do not need to be a [facility] resident to be seen for a dental emergency. Emergencies are defined as pain, swelling, abnormal bleeding, or dental trauma. Walkins will receive x-rays if needed. NOTE: Dental cleaning is not an emergency. Payment Options: PAYMENT IS DUE AT THE TIME OF SERVICE. Minimum co-pay is $40.00 for uninsured patients. Minimum co-pay is $3.00 for Medicaid with dental coverage. Dental Insurance is accepted and must be presented at time of visit. Medicare does not cover dental. Forms of payment: Cash, credit card, checks. Best way to get seen: If not previously registered with the clinic, walk-in dental registration begins at 7:15 am and is on a first come/first serve basis. If previously registered with the clinic, call to make an appointment.     [facility] [place] ONLY   Location: [address] [place], [place] Clinic Hours: Mon-Thu 10a-2p Services: Extractions only! Payment Options: FREE (donations accepted) - bring proof of income or support Best way to get seen: Call and schedule an appointment OR come at 8am on the 1st Monday of every month (except for holidays) when it is first come/first served.     [facility] [phone]   Location: [place], [place] Clinic Hours: Friday mornings Services, Payment Options, Best way to get seen: Call for info

## 2015-01-16 NOTE — ED Notes (Signed)
States some of her teeth are breaking off. This jan finished chemo

## 2015-01-16 NOTE — ED Provider Notes (Signed)
[facility] Emergency Department Provider Note  ____________________________________________  Time seen: Approximately 5:19 PM  I have reviewed the triage vital signs and the nursing notes.   HISTORY  Chief Complaint Dental Pain    HPI Desiree Mitchell is a 59 y.o. female who presents for states that some of her teeth are breaking off on the right lower side with dental caries. Patient is status post chemotherapy for bilateral mastectomy for secondary to breast cancer. States that she has similar incident 10 years ago when she went through chemotherapy the last time. In addition she desires a refill on her blood pressure medication.   Past Medical History  Diagnosis Date  . Hypertension   . Breast cancer   . Hydrocephalus   . Asthma   . Stroke     Patient Active Problem List   Diagnosis Date Noted  . CVA (cerebral vascular accident) 11/10/2012  . Weakness of left side of body 11/05/2012  . HTN (hypertension) 11/05/2012  . 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 Obesity 11/05/2012  . Asthma 11/05/2012    Past Surgical History  Procedure Laterality Date  . Brain surgery      stent in head    Current Outpatient Rx  Name  Route  Sig  Dispense  Refill  . acetaminophen (TYLENOL) 325 MG tablet   Oral   Take 650 mg by mouth every 6 (six) hours as needed for pain.         Marland Kitchen albuterol-ipratropium (COMBIVENT) 18-103 MCG/ACT inhaler   Inhalation   Inhale 2 puffs into the lungs every 6 (six) hours as needed for wheezing.   1 Inhaler   6   . amoxicillin (AMOXIL) 500 MG tablet   Oral   Take 1 tablet (500 mg total) by mouth 3 (three) times daily.   30 tablet   0   . aspirin 325 MG tablet   Oral   Take 1 tablet (325 mg total) by mouth daily.   60 tablet   0   . cloNIDine (CATAPRES) 0.2 MG tablet   Oral   Take 1 tablet (0.2 mg total) by mouth 2 (two) times daily.   60 tablet   0   . HYDROcodone-acetaminophen (NORCO) 5-325 MG per  tablet   Oral   Take 1-2 tablets by mouth every 4 (four) hours as needed for moderate pain.   15 tablet   0   . ibuprofen (ADVIL,MOTRIN) 800 MG tablet   Oral   Take 1 tablet (800 mg total) by mouth every 8 (eight) hours as needed.   30 tablet   0   . metoprolol (LOPRESSOR) 50 MG tablet   Oral   Take 1 tablet (50 mg total) by mouth daily.   30 tablet   0   . pantoprazole (PROTONIX) 40 MG tablet   Oral   Take 1 tablet (40 mg total) by mouth daily.   30 tablet   1   . simvastatin (ZOCOR) 40 MG tablet   Oral   Take 1 tablet (40 mg total) by mouth daily at 6 PM.   30 tablet   6     Allergies Tape  Family History  Problem Relation Age of Onset  . Cancer Mother   . Diabetes Father   . Cancer Father     Social History History  Substance Use Topics  . Smoking status: Never Smoker   . Smokeless tobacco: Not on file  . Alcohol Use: No  Review of Systems Constitutional: No fever/chills Eyes: No visual changes. ENT: No sore throat. Positive dental pain. Cardiovascular: Denies chest pain. Respiratory: Denies shortness of breath. Gastrointestinal: No abdominal pain.  No nausea, no vomiting.  No diarrhea.  No constipation. Genitourinary: Negative for dysuria. Musculoskeletal: Negative for back pain. Skin: Negative for rash. Neurological: Negative for headaches, focal weakness or numbness.  10-point ROS otherwise negative.  ____________________________________________   PHYSICAL EXAM:  VITAL SIGNS: ED Triage Vitals  Enc Vitals Group     BP --      Pulse Rate 01/16/15 1655 94     Resp 01/16/15 1655 20     Temp 01/16/15 1655 98.5 F (36.9 C)     Temp Source 01/16/15 1655 Oral     SpO2 --      Weight 01/16/15 1655 200 lb (90.719 kg)     Height 01/16/15 1655 5\' 1"  (1.549 m)     Head Cir --      Peak Flow --      Pain Score 01/16/15 1657 9     Pain Loc --      Pain Edu? --      Excl. in [place]? --     Constitutional: Alert and oriented. Well appearing  and in no acute distress. Eyes: Conjunctivae are normal. PERRL. EOMI. Head: Atraumatic. Nose: No congestion/rhinnorhea. Mouth/Throat: Mucous membranes are moist.  Oropharynx non-erythematous. Positive caries noted on the right side of fractured teeth. Gums mildly erythematous. Neck: No stridor.   Cardiovascular: Normal rate, regular rhythm. Grossly normal heart sounds.  Good peripheral circulation. Respiratory: Normal respiratory effort.  No retractions. Lungs CTAB. Musculoskeletal: No lower extremity tenderness nor edema.  No joint effusions. Neurologic:  Normal speech and language. No gross focal neurologic deficits are appreciated. Speech is normal. No gait instability. Skin:  Skin is warm, dry and intact. No rash noted. Psychiatric: Mood and affect are normal. Speech and behavior are normal.  ____________________________________________   LABS (all labs ordered are listed, but only abnormal results are displayed)  Labs Reviewed - No data to display ____________________________________________  EKG  Deferred ____________________________________________  RADIOLOGY  Deferred ____________________________________________   PROCEDURES  Procedure(s) performed: None  Critical Care performed: No  ____________________________________________   INITIAL IMPRESSION / ASSESSMENT AND PLAN / ED COURSE  Pertinent labs & imaging results that were available during my care of the patient were reviewed by me and considered in my medical decision making (see chart for details). Dental caries, secondary to chemotherapy.Burnis Medin prescribe amoxicillin 500 mg 3 times a day, Motrin 800 mg 3 times a day, and hydrocodone as needed for severe pain. Patient instructed to follow-up with her PCP for her blood pressure medications which were provided as a courtesy today. A list of dental providers in the area was provided. She voices no other emergency medical complaints at this  time. ____________________________________________   FINAL CLINICAL IMPRESSION(S) / ED DIAGNOSES  Final diagnoses:  Pain, dental      Arlyss Repress, PA-C 01/16/15 1753  Ponciano Ort, MD 01/17/15 [phone]

## 2016-05-24 ENCOUNTER — Emergency Department
Admission: EM | Admit: 2016-05-24 | Discharge: 2016-05-24 | Disposition: A | Discharge status: Home / Self Care | Payer: Medicare Other | Attending: Emergency Medicine | Admitting: Emergency Medicine

## 2016-05-24 ENCOUNTER — Encounter: Payer: Self-pay | Admitting: Medical Oncology

## 2016-05-24 ENCOUNTER — Emergency Department: Payer: Medicare Other

## 2016-05-24 DIAGNOSIS — Y939 Activity, unspecified: Secondary | ICD-10-CM | POA: Diagnosis not present

## 2016-05-24 DIAGNOSIS — Z760 Encounter for issue of repeat prescription: Secondary | ICD-10-CM | POA: Insufficient documentation

## 2016-05-24 DIAGNOSIS — X501XXA Overexertion from prolonged static or awkward postures, initial encounter: Secondary | ICD-10-CM | POA: Insufficient documentation

## 2016-05-24 DIAGNOSIS — Z853 Personal history of malignant neoplasm of breast: Secondary | ICD-10-CM | POA: Diagnosis not present

## 2016-05-24 DIAGNOSIS — Y929 Unspecified place or not applicable: Secondary | ICD-10-CM | POA: Insufficient documentation

## 2016-05-24 DIAGNOSIS — S8991XA Unspecified injury of right lower leg, initial encounter: Secondary | ICD-10-CM | POA: Diagnosis present

## 2016-05-24 DIAGNOSIS — Y999 Unspecified external cause status: Secondary | ICD-10-CM | POA: Insufficient documentation

## 2016-05-24 DIAGNOSIS — Z7982 Long term (current) use of aspirin: Secondary | ICD-10-CM | POA: Diagnosis not present

## 2016-05-24 DIAGNOSIS — Z79899 Other long term (current) drug therapy: Secondary | ICD-10-CM | POA: Insufficient documentation

## 2016-05-24 DIAGNOSIS — I10 Essential (primary) hypertension: Secondary | ICD-10-CM | POA: Diagnosis not present

## 2016-05-24 DIAGNOSIS — J45909 Unspecified asthma, uncomplicated: Secondary | ICD-10-CM | POA: Diagnosis not present

## 2016-05-24 DIAGNOSIS — M25561 Pain in right knee: Secondary | ICD-10-CM

## 2016-05-24 DIAGNOSIS — S8001XA Contusion of right knee, initial encounter: Secondary | ICD-10-CM | POA: Insufficient documentation

## 2016-05-24 MED ORDER — TRAMADOL HCL 50 MG PO TABS: 50.0000 mg | ORAL_TABLET | Freq: Four times a day (QID) | ORAL | 0 refills | Status: AC | PRN

## 2016-05-24 MED ORDER — METOPROLOL TARTRATE 50 MG PO TABS: 50.0000 mg | ORAL_TABLET | Freq: Every day | ORAL | 0 refills | Status: DC

## 2016-05-24 NOTE — ED Triage Notes (Signed)
Pt reports she lost her balance yesterday and fell, denies hitting head, ,denies LOC. Since fall has been having rt knee pain that radiates up thigh. Pt is ambulatory to triage.

## 2016-05-24 NOTE — ED Notes (Signed)
Pt states right knee and upper leg pain after a fall yesterday, pt ambulatory to room using cane

## 2016-05-24 NOTE — ED Provider Notes (Signed)
[facility] Provider Note   CSN: [id number] Arrival date & time: 05/24/16  1658     History   Chief Complaint Chief Complaint  Patient presents with  . Fall  . Knee Pain    HPI Desiree Mitchell is a 60 y.o. female who presents emergency department for evaluation of right knee pain. Patient fell onto her right knee and twisted the knee one day ago. Patient was able to ambulate, went home and lateral showed a recliner. She had horrible pain throughout the night. Pain was severe, located along the anterior aspect of the knee.. She continues to have moderate pain today. She's had ibuprofen with no improvement. She denies any hip or ankle pain. She denies any other injury to her body. Patient states the fall occurred due to tripping, she did not injure her neck, head. She has had some swelling throughout the knee and pain with weightbearing activities.  HPI  Past Medical History:  Diagnosis Date  . Asthma   . Breast cancer ([place])   . Hydrocephalus   . Hypertension   . Stroke [facility])     Patient Active Problem List   Diagnosis Date Noted  . CVA (cerebral vascular accident) ([place]) 11/10/2012  . Weakness of left side of body 11/05/2012  . HTN (hypertension) 11/05/2012  . 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 Obesity 11/05/2012  . Asthma 11/05/2012    Past Surgical History:  Procedure Laterality Date  . BRAIN SURGERY     stent in head    OB History    No data available       Home Medications    Prior to Admission medications   Medication Sig Start Date End Date Taking? Authorizing Provider  acetaminophen (TYLENOL) 325 MG tablet Take 650 mg by mouth every 6 (six) hours as needed for pain.    Historical Provider, MD  albuterol-ipratropium (COMBIVENT) 18-103 MCG/ACT inhaler Inhale 2 puffs into the lungs every 6 (six) hours as needed for wheezing. 04/06/13   Reyne Dumas, MD  amoxicillin (AMOXIL) 500 MG tablet Take 1 tablet (500 mg total) by mouth 3  (three) times daily. 01/16/15   Arlyss Repress, PA-C  aspirin 325 MG tablet Take 1 tablet (325 mg total) by mouth daily. 11/13/12   Lavon Paganini Angiulli, PA-C  cloNIDine (CATAPRES) 0.2 MG tablet Take 1 tablet (0.2 mg total) by mouth 2 (two) times daily. 01/16/15 01/16/16  Pierce Crane Beers, PA-C  HYDROcodone-acetaminophen (NORCO) 5-325 MG per tablet Take 1-2 tablets by mouth every 4 (four) hours as needed for moderate pain. 01/16/15   Pierce Crane Beers, PA-C  ibuprofen (ADVIL,MOTRIN) 800 MG tablet Take 1 tablet (800 mg total) by mouth every 8 (eight) hours as needed. 01/16/15   Pierce Crane Beers, PA-C  metoprolol (LOPRESSOR) 50 MG tablet Take 1 tablet (50 mg total) by mouth daily. 05/24/16 05/24/17  Duanne Guess, PA-C  pantoprazole (PROTONIX) 40 MG tablet Take 1 tablet (40 mg total) by mouth daily. 04/06/13   Reyne Dumas, MD  simvastatin (ZOCOR) 40 MG tablet Take 1 tablet (40 mg total) by mouth daily at 6 PM. 04/06/13   Reyne Dumas, MD  traMADol (ULTRAM) 50 MG tablet Take 1 tablet (50 mg total) by mouth every 6 (six) hours as needed. 05/24/16 05/24/17  Duanne Guess, PA-C    Family History Family History  Problem Relation Age of Onset  . Cancer Mother   . Diabetes Father   . Cancer Father  Social History Social History  Substance Use Topics  . Smoking status: Never Smoker  . Smokeless tobacco: Not on file  . Alcohol use No     Allergies   Tape   Review of Systems Review of Systems  Constitutional: Negative for activity change, chills, fatigue and fever.  HENT: Negative for congestion, sinus pressure and sore throat.   Eyes: Negative for visual disturbance.  Respiratory: Negative for cough, chest tightness and shortness of breath.   Cardiovascular: Negative for chest pain and leg swelling.  Gastrointestinal: Negative for abdominal pain, diarrhea, nausea and vomiting.  Genitourinary: Negative for dysuria.  Musculoskeletal: Positive for arthralgias and gait problem.  Skin: Negative for  rash.  Neurological: Negative for weakness, numbness and headaches.  Hematological: Negative for adenopathy.  Psychiatric/Behavioral: Negative for agitation, behavioral problems and confusion.     Physical Exam Updated Vital Signs BP (!) 161/91 (BP Location: Left Arm)   Pulse 98   Temp 98.4 F (36.9 C)   Resp 18   Ht 5\' 1"  (1.549 m)   Wt 95.3 kg   SpO2 96%   BMI 39.68 kg/m   Physical Exam  Constitutional: She is oriented to person, place, and time. She appears well-developed and well-nourished. No distress.  Patient ambulates with a cane.  HENT:  Head: Normocephalic and atraumatic.  Mouth/Throat: Oropharynx is clear and moist.  Eyes: EOM are normal. Pupils are equal, round, and reactive to light. Right eye exhibits no discharge. Left eye exhibits no discharge.  Neck: Normal range of motion. Neck supple.  Cardiovascular: Normal rate, regular rhythm and intact distal pulses.   Pulmonary/Chest: Effort normal and breath sounds normal. No respiratory distress. She exhibits no tenderness.  Abdominal: Soft. She exhibits no distension. There is no tenderness.  Musculoskeletal: She exhibits no edema.  Examination of the right lower extremity shows patient has good range of motion right hip with internal and external rotation. No discomfort. She is able to straight leg raise. She is tender on the anterior aspect of the knee along the superior and inferior poles of the patella. Moderate ecchymosis to the anterior aspect of the knee. She has tenderness on the medial and lateral joint lines. She has a mild knee effusion with 0-100 range of motion. Knee is stable to valgus and varus stress testing. No skin breakdown noted. She has minimal swelling throughout the right leg, negative Homans sign. No tenderness throughout the ankle.  Neurological: She is alert and oriented to person, place, and time. She has normal reflexes.  Skin: Skin is warm and dry.  Psychiatric: She has a normal mood and  affect. Her behavior is normal. Thought content normal.     ED Treatments / Results  Labs (all labs ordered are listed, but only abnormal results are displayed) Labs Reviewed - No data to display  EKG  EKG Interpretation None       Radiology Dg Knee Complete 4 Views Right  Result Date: 05/24/2016 CLINICAL DATA:  Patient fell on left lower last night and is having right knee pain below the patella. Initial encounter. EXAM: RIGHT KNEE - COMPLETE 4+ VIEW COMPARISON:  None. FINDINGS: No evidence of fracture, dislocation, or joint effusion. No evidence of arthropathy or other focal bone abnormality. Soft tissues are unremarkable. IMPRESSION: Negative. Electronically Signed   By: Misty Stanley M.D.   On: 05/24/2016 18:52    Procedures Procedures (including critical care time)  Medications Ordered in ED Medications - No data to display   Initial Impression /  Assessment and Plan / ED Course  I have reviewed the triage vital signs and the nursing notes.  Pertinent labs & imaging results that were available during my care of the patient were reviewed by me and considered in my medical decision making (see chart for details).  Clinical Course  60 year old female with fall onto the right knee. X-ray show no evidence of acute bony abnormality. She has bruising on exam. Knee ligaments secretions are stable. Patient also requesting medication refill for her metoprolol, has an appointment in 3 weeks with new PCP. Medication was refilled.Patient will rest ice and elevate the knee. Tramadol as needed for pain. Follow-up with orthopedics if no improvement in 1 week.  Final Clinical Impressions(s) / ED Diagnoses   Final diagnoses:  Acute pain of right knee  Contusion of right knee, initial encounter  Medication refill    New Prescriptions New Prescriptions   TRAMADOL (ULTRAM) 50 MG TABLET    Take 1 tablet (50 mg total) by mouth every 6 (six) hours as needed.     Duanne Guess,  PA-C 05/24/16 [place], PA-C 05/24/16 [place], MD 05/24/16 [id number]

## 2021-05-21 ENCOUNTER — Other Ambulatory Visit: Payer: Self-pay

## 2021-05-21 ENCOUNTER — Inpatient Hospital Stay
Admission: EM | Admit: 2021-05-21 | Discharge: 2021-07-11 | DRG: 853 | Disposition: A | Discharge status: Skilled Nursing Facility | Payer: Medicare (Managed Care) | Attending: Internal Medicine | Admitting: Internal Medicine

## 2021-05-21 ENCOUNTER — Encounter
Admission: EM | Disposition: A | Discharge status: Skilled Nursing Facility | Payer: Self-pay | Source: Home / Self Care | Attending: Internal Medicine

## 2021-05-21 ENCOUNTER — Emergency Department: Payer: Medicare (Managed Care)

## 2021-05-21 DIAGNOSIS — E878 Other disorders of electrolyte and fluid balance, not elsewhere classified: Secondary | ICD-10-CM | POA: Diagnosis not present

## 2021-05-21 DIAGNOSIS — A4159 Other Gram-negative sepsis: Secondary | ICD-10-CM | POA: Diagnosis present

## 2021-05-21 DIAGNOSIS — A411 Sepsis due to other specified staphylococcus: Secondary | ICD-10-CM | POA: Diagnosis not present

## 2021-05-21 DIAGNOSIS — N390 Urinary tract infection, site not specified: Secondary | ICD-10-CM | POA: Diagnosis not present

## 2021-05-21 DIAGNOSIS — E8770 Fluid overload, unspecified: Secondary | ICD-10-CM | POA: Diagnosis not present

## 2021-05-21 DIAGNOSIS — I69354 Hemiplegia and hemiparesis following cerebral infarction affecting left non-dominant side: Secondary | ICD-10-CM

## 2021-05-21 DIAGNOSIS — M79642 Pain in left hand: Secondary | ICD-10-CM | POA: Diagnosis not present

## 2021-05-21 DIAGNOSIS — R1312 Dysphagia, oropharyngeal phase: Secondary | ICD-10-CM | POA: Diagnosis not present

## 2021-05-21 DIAGNOSIS — I6340 Cerebral infarction due to embolism of unspecified cerebral artery: Secondary | ICD-10-CM | POA: Diagnosis not present

## 2021-05-21 DIAGNOSIS — L89312 Pressure ulcer of right buttock, stage 2: Secondary | ICD-10-CM | POA: Diagnosis present

## 2021-05-21 DIAGNOSIS — Z6841 Body Mass Index (BMI) 40.0 and over, adult: Secondary | ICD-10-CM

## 2021-05-21 DIAGNOSIS — L89324 Pressure ulcer of left buttock, stage 4: Secondary | ICD-10-CM

## 2021-05-21 DIAGNOSIS — A419 Sepsis, unspecified organism: Secondary | ICD-10-CM | POA: Diagnosis present

## 2021-05-21 DIAGNOSIS — B9620 Unspecified Escherichia coli [E. coli] as the cause of diseases classified elsewhere: Secondary | ICD-10-CM | POA: Diagnosis not present

## 2021-05-21 DIAGNOSIS — G9340 Encephalopathy, unspecified: Secondary | ICD-10-CM

## 2021-05-21 DIAGNOSIS — Z66 Do not resuscitate: Secondary | ICD-10-CM | POA: Diagnosis not present

## 2021-05-21 DIAGNOSIS — D72829 Elevated white blood cell count, unspecified: Secondary | ICD-10-CM | POA: Diagnosis not present

## 2021-05-21 DIAGNOSIS — M6282 Rhabdomyolysis: Secondary | ICD-10-CM

## 2021-05-21 DIAGNOSIS — Z4659 Encounter for fitting and adjustment of other gastrointestinal appliance and device: Secondary | ICD-10-CM

## 2021-05-21 DIAGNOSIS — E538 Deficiency of other specified B group vitamins: Secondary | ICD-10-CM | POA: Diagnosis not present

## 2021-05-21 DIAGNOSIS — L89152 Pressure ulcer of sacral region, stage 2: Secondary | ICD-10-CM | POA: Diagnosis present

## 2021-05-21 DIAGNOSIS — Z9911 Dependence on respirator [ventilator] status: Secondary | ICD-10-CM

## 2021-05-21 DIAGNOSIS — Z978 Presence of other specified devices: Secondary | ICD-10-CM

## 2021-05-21 DIAGNOSIS — G919 Hydrocephalus, unspecified: Secondary | ICD-10-CM | POA: Diagnosis present

## 2021-05-21 DIAGNOSIS — R0902 Hypoxemia: Secondary | ICD-10-CM

## 2021-05-21 DIAGNOSIS — T8502XA Displacement of ventricular intracranial (communicating) shunt, initial encounter: Secondary | ICD-10-CM | POA: Diagnosis present

## 2021-05-21 DIAGNOSIS — Z79899 Other long term (current) drug therapy: Secondary | ICD-10-CM

## 2021-05-21 DIAGNOSIS — G928 Other toxic encephalopathy: Secondary | ICD-10-CM | POA: Diagnosis present

## 2021-05-21 DIAGNOSIS — J9600 Acute respiratory failure, unspecified whether with hypoxia or hypercapnia: Secondary | ICD-10-CM

## 2021-05-21 DIAGNOSIS — S80861A Insect bite (nonvenomous), right lower leg, initial encounter: Secondary | ICD-10-CM | POA: Diagnosis present

## 2021-05-21 DIAGNOSIS — D62 Acute posthemorrhagic anemia: Secondary | ICD-10-CM | POA: Diagnosis not present

## 2021-05-21 DIAGNOSIS — N179 Acute kidney failure, unspecified: Secondary | ICD-10-CM

## 2021-05-21 DIAGNOSIS — N950 Postmenopausal bleeding: Secondary | ICD-10-CM | POA: Diagnosis not present

## 2021-05-21 DIAGNOSIS — R7401 Elevation of levels of liver transaminase levels: Secondary | ICD-10-CM

## 2021-05-21 DIAGNOSIS — R6520 Severe sepsis without septic shock: Secondary | ICD-10-CM | POA: Diagnosis present

## 2021-05-21 DIAGNOSIS — W19XXXA Unspecified fall, initial encounter: Secondary | ICD-10-CM

## 2021-05-21 DIAGNOSIS — E870 Hyperosmolality and hypernatremia: Secondary | ICD-10-CM | POA: Diagnosis not present

## 2021-05-21 DIAGNOSIS — Z8679 Personal history of other diseases of the circulatory system: Secondary | ICD-10-CM

## 2021-05-21 DIAGNOSIS — E860 Dehydration: Secondary | ICD-10-CM | POA: Diagnosis present

## 2021-05-21 DIAGNOSIS — E861 Hypovolemia: Secondary | ICD-10-CM | POA: Diagnosis present

## 2021-05-21 DIAGNOSIS — R4781 Slurred speech: Secondary | ICD-10-CM | POA: Diagnosis not present

## 2021-05-21 DIAGNOSIS — S80862A Insect bite (nonvenomous), left lower leg, initial encounter: Secondary | ICD-10-CM | POA: Diagnosis present

## 2021-05-21 DIAGNOSIS — Z01818 Encounter for other preprocedural examination: Secondary | ICD-10-CM

## 2021-05-21 DIAGNOSIS — Z515 Encounter for palliative care: Secondary | ICD-10-CM

## 2021-05-21 DIAGNOSIS — Z9013 Acquired absence of bilateral breasts and nipples: Secondary | ICD-10-CM

## 2021-05-21 DIAGNOSIS — Z789 Other specified health status: Secondary | ICD-10-CM

## 2021-05-21 DIAGNOSIS — B888 Other specified infestations: Secondary | ICD-10-CM | POA: Diagnosis present

## 2021-05-21 DIAGNOSIS — I76 Septic arterial embolism: Secondary | ICD-10-CM | POA: Diagnosis not present

## 2021-05-21 DIAGNOSIS — J9601 Acute respiratory failure with hypoxia: Secondary | ICD-10-CM | POA: Diagnosis present

## 2021-05-21 DIAGNOSIS — Z853 Personal history of malignant neoplasm of breast: Secondary | ICD-10-CM

## 2021-05-21 DIAGNOSIS — E8729 Other acidosis: Secondary | ICD-10-CM

## 2021-05-21 DIAGNOSIS — Z9221 Personal history of antineoplastic chemotherapy: Secondary | ICD-10-CM

## 2021-05-21 DIAGNOSIS — K219 Gastro-esophageal reflux disease without esophagitis: Secondary | ICD-10-CM | POA: Diagnosis present

## 2021-05-21 DIAGNOSIS — Z23 Encounter for immunization: Secondary | ICD-10-CM

## 2021-05-21 DIAGNOSIS — L0889 Other specified local infections of the skin and subcutaneous tissue: Secondary | ICD-10-CM | POA: Diagnosis present

## 2021-05-21 DIAGNOSIS — Y831 Surgical operation with implant of artificial internal device as the cause of abnormal reaction of the patient, or of later complication, without mention of misadventure at the time of the procedure: Secondary | ICD-10-CM | POA: Diagnosis present

## 2021-05-21 DIAGNOSIS — F32A Depression, unspecified: Secondary | ICD-10-CM | POA: Diagnosis not present

## 2021-05-21 DIAGNOSIS — I639 Cerebral infarction, unspecified: Secondary | ICD-10-CM

## 2021-05-21 DIAGNOSIS — Y92009 Unspecified place in unspecified non-institutional (private) residence as the place of occurrence of the external cause: Secondary | ICD-10-CM

## 2021-05-21 DIAGNOSIS — T85618A Breakdown (mechanical) of other specified internal prosthetic devices, implants and grafts, initial encounter: Secondary | ICD-10-CM

## 2021-05-21 DIAGNOSIS — N840 Polyp of corpus uteri: Secondary | ICD-10-CM | POA: Diagnosis present

## 2021-05-21 DIAGNOSIS — L89896 Pressure-induced deep tissue damage of other site: Secondary | ICD-10-CM | POA: Diagnosis present

## 2021-05-21 DIAGNOSIS — E872 Acidosis, unspecified: Secondary | ICD-10-CM | POA: Diagnosis present

## 2021-05-21 DIAGNOSIS — G936 Cerebral edema: Secondary | ICD-10-CM | POA: Diagnosis present

## 2021-05-21 DIAGNOSIS — R0602 Shortness of breath: Secondary | ICD-10-CM

## 2021-05-21 DIAGNOSIS — I10 Essential (primary) hypertension: Secondary | ICD-10-CM | POA: Diagnosis present

## 2021-05-21 DIAGNOSIS — Z20822 Contact with and (suspected) exposure to covid-19: Secondary | ICD-10-CM | POA: Diagnosis present

## 2021-05-21 DIAGNOSIS — L8990 Pressure ulcer of unspecified site, unspecified stage: Secondary | ICD-10-CM | POA: Insufficient documentation

## 2021-05-21 DIAGNOSIS — K5900 Constipation, unspecified: Secondary | ICD-10-CM | POA: Diagnosis not present

## 2021-05-21 DIAGNOSIS — J45909 Unspecified asthma, uncomplicated: Secondary | ICD-10-CM | POA: Diagnosis present

## 2021-05-21 DIAGNOSIS — J9811 Atelectasis: Secondary | ICD-10-CM | POA: Diagnosis present

## 2021-05-21 DIAGNOSIS — N939 Abnormal uterine and vaginal bleeding, unspecified: Secondary | ICD-10-CM

## 2021-05-21 HISTORY — PX: SHUNT REMOVAL: SHX342

## 2021-05-21 HISTORY — PX: VENTRICULOSTOMY: SHX5377

## 2021-05-21 LAB — URINALYSIS, ROUTINE W REFLEX MICROSCOPIC
Bilirubin Urine: NEGATIVE
Glucose, UA: NEGATIVE mg/dL
Ketones, ur: 5 mg/dL — AB
Leukocytes,Ua: NEGATIVE
Nitrite: NEGATIVE
Protein, ur: 30 mg/dL — AB
Specific Gravity, Urine: 1.025 (ref 1.005–1.030)
pH: 5 (ref 5.0–8.0)

## 2021-05-21 LAB — BLOOD GAS, VENOUS
Acid-base deficit: 7.3 mmol/L — ABNORMAL HIGH (ref 0.0–2.0)
Bicarbonate: 21.2 mmol/L (ref 20.0–28.0)
O2 Saturation: 46.7 %
Patient temperature: 37
pCO2, Ven: 53 mmHg (ref 44.0–60.0)
pH, Ven: 7.21 — ABNORMAL LOW (ref 7.250–7.430)
pO2, Ven: 32 mmHg (ref 32.0–45.0)

## 2021-05-21 LAB — COMPREHENSIVE METABOLIC PANEL
ALT: 59 U/L — ABNORMAL HIGH (ref 0–44)
AST: 155 U/L — ABNORMAL HIGH (ref 15–41)
Albumin: 3.6 g/dL (ref 3.5–5.0)
Alkaline Phosphatase: 76 U/L (ref 38–126)
Anion gap: 23 — ABNORMAL HIGH (ref 5–15)
BUN: 84 mg/dL — ABNORMAL HIGH (ref 8–23)
CO2: 17 mmol/L — ABNORMAL LOW (ref 22–32)
Calcium: 8.8 mg/dL — ABNORMAL LOW (ref 8.9–10.3)
Chloride: 97 mmol/L — ABNORMAL LOW (ref 98–111)
Creatinine, Ser: 2.81 mg/dL — ABNORMAL HIGH (ref 0.44–1.00)
GFR, Estimated: 18 mL/min — ABNORMAL LOW (ref >60)
Glucose, Bld: 129 mg/dL — ABNORMAL HIGH (ref 70–99)
Potassium: 4.8 mmol/L (ref 3.5–5.1)
Sodium: 137 mmol/L (ref 135–145)
Total Bilirubin: 1.1 mg/dL (ref 0.3–1.2)
Total Protein: 7.6 g/dL (ref 6.5–8.1)

## 2021-05-21 LAB — CBC WITH DIFFERENTIAL/PLATELET
Abs Immature Granulocytes: 0.3 10*3/uL — ABNORMAL HIGH (ref 0.00–0.07)
Basophils Absolute: 0.1 10*3/uL (ref 0.0–0.1)
Basophils Relative: 0 %
Eosinophils Absolute: 0.1 10*3/uL (ref 0.0–0.5)
Eosinophils Relative: 0 %
HCT: 49 % — ABNORMAL HIGH (ref 36.0–46.0)
Hemoglobin: 15.9 g/dL — ABNORMAL HIGH (ref 12.0–15.0)
Immature Granulocytes: 1 %
Lymphocytes Relative: 2 %
Lymphs Abs: 0.6 10*3/uL — ABNORMAL LOW (ref 0.7–4.0)
MCH: 31.2 pg (ref 26.0–34.0)
MCHC: 32.4 g/dL (ref 30.0–36.0)
MCV: 96.3 fL (ref 80.0–100.0)
Monocytes Absolute: 2.1 10*3/uL — ABNORMAL HIGH (ref 0.1–1.0)
Monocytes Relative: 7 %
Neutro Abs: 27.1 10*3/uL — ABNORMAL HIGH (ref 1.7–7.7)
Neutrophils Relative %: 90 %
Platelets: 491 10*3/uL — ABNORMAL HIGH (ref 150–400)
RBC: 5.09 MIL/uL (ref 3.87–5.11)
RDW: 13.6 % (ref 11.5–15.5)
Smear Review: NORMAL
WBC: 30.3 10*3/uL — ABNORMAL HIGH (ref 4.0–10.5)
nRBC: 0 % (ref 0.0–0.2)

## 2021-05-21 LAB — CK: Total CK: 6755 U/L — ABNORMAL HIGH (ref 38–234)

## 2021-05-21 LAB — T4, FREE: Free T4: 1.37 ng/dL — ABNORMAL HIGH (ref 0.61–1.12)

## 2021-05-21 LAB — RESP PANEL BY RT-PCR (FLU A&B, COVID) ARPGX2
Influenza A by PCR: NEGATIVE
Influenza B by PCR: NEGATIVE
SARS Coronavirus 2 by RT PCR: NEGATIVE

## 2021-05-21 LAB — AMMONIA: Ammonia: 29 umol/L (ref 9–35)

## 2021-05-21 LAB — URINE DRUG SCREEN, QUALITATIVE (ARMC ONLY)
Amphetamines, Ur Screen: NOT DETECTED
Barbiturates, Ur Screen: NOT DETECTED
Benzodiazepine, Ur Scrn: NOT DETECTED
Cannabinoid 50 Ng, Ur ~~LOC~~: NOT DETECTED
Cocaine Metabolite,Ur ~~LOC~~: NOT DETECTED
MDMA (Ecstasy)Ur Screen: NOT DETECTED
Methadone Scn, Ur: NOT DETECTED
Opiate, Ur Screen: NOT DETECTED
Phencyclidine (PCP) Ur S: NOT DETECTED
Tricyclic, Ur Screen: NOT DETECTED

## 2021-05-21 LAB — TSH: TSH: 2.101 u[IU]/mL (ref 0.350–4.500)

## 2021-05-21 LAB — TROPONIN I (HIGH SENSITIVITY): Troponin I (High Sensitivity): 22 ng/L — ABNORMAL HIGH (ref <18)

## 2021-05-21 LAB — LACTIC ACID, PLASMA: Lactic Acid, Venous: 2.4 mmol/L (ref 0.5–1.9)

## 2021-05-21 SURGERY — SHUNT REMOVAL
Anesthesia: General | Laterality: Left

## 2021-05-21 MED ORDER — SODIUM CHLORIDE 0.9 % IV BOLUS
1000.0000 mL | Freq: Once | INTRAVENOUS | Status: AC
  Administered 2021-05-21: 1000 mL via INTRAVENOUS

## 2021-05-21 MED ORDER — NALOXONE HCL 2 MG/2ML IJ SOSY
0.4000 mg | PREFILLED_SYRINGE | Freq: Once | INTRAMUSCULAR | Status: DC
  Filled 2021-05-21: qty 2

## 2021-05-21 MED ORDER — PROPOFOL 10 MG/ML IV BOLUS
INTRAVENOUS | Status: AC
  Filled 2021-05-21: qty 20

## 2021-05-21 MED ORDER — SODIUM CHLORIDE 0.9 % IV SOLN
2.0000 g | Freq: Once | INTRAVENOUS | Status: AC
  Administered 2021-05-21: 2 g via INTRAVENOUS
  Filled 2021-05-21: qty 2

## 2021-05-21 MED ORDER — VANCOMYCIN HCL IN DEXTROSE 1-5 GM/200ML-% IV SOLN: 1000.0000 mg | Freq: Once | INTRAVENOUS | Status: DC

## 2021-05-21 MED ORDER — TETANUS-DIPHTH-ACELL PERTUSSIS 5-2.5-18.5 LF-MCG/0.5 IM SUSY
0.5000 mL | PREFILLED_SYRINGE | Freq: Once | INTRAMUSCULAR | Status: AC
  Administered 2021-05-21: 0.5 mL via INTRAMUSCULAR
  Filled 2021-05-21: qty 0.5

## 2021-05-21 MED ORDER — VANCOMYCIN HCL 2000 MG/400ML IV SOLN
2000.0000 mg | Freq: Once | INTRAVENOUS | Status: AC
  Administered 2021-05-21: 2000 mg via INTRAVENOUS
  Filled 2021-05-21: qty 400

## 2021-05-21 MED ORDER — METRONIDAZOLE 500 MG/100ML IV SOLN
500.0000 mg | Freq: Once | INTRAVENOUS | Status: AC
  Administered 2021-05-21: 500 mg via INTRAVENOUS
  Filled 2021-05-21: qty 100

## 2021-05-21 SURGICAL SUPPLY — 86 items
BLADE CLIPPER SURG (BLADE) ×2 IMPLANT
BLADE SURG 15 STRL LF DISP TIS (BLADE) ×1 IMPLANT
BLADE SURG 15 STRL SS (BLADE) ×1
BOOT SUTURE AID YELLOW STND (SUTURE) IMPLANT
BUR ACORN 7.5 PRECISION (BURR) ×2 IMPLANT
CHLORAPREP W/TINT 26 (MISCELLANEOUS) ×6 IMPLANT
COUNTER NEEDLE 20/40 LG (NEEDLE) ×4 IMPLANT
DEFOGGER ANTIFOG KIT (MISCELLANEOUS) ×2 IMPLANT
DERMABOND ADVANCED (GAUZE/BANDAGES/DRESSINGS) ×1
DERMABOND ADVANCED .7 DNX12 (GAUZE/BANDAGES/DRESSINGS) ×1 IMPLANT
DRAPE INCISE IOBAN 66X45 STRL (DRAPES) ×2 IMPLANT
DRAPE LAPAROTOMY 100X77 ABD (DRAPES) IMPLANT
DRAPE ORTHO SPLIT 77X108 STRL (DRAPES) ×1
DRAPE POUCH INSTRU U-SHP 10X18 (DRAPES) IMPLANT
DRAPE SURG 17X11 SM STRL (DRAPES) ×20 IMPLANT
DRAPE SURG IRRIG POUCH 19X23 (DRAPES) IMPLANT
DRAPE SURG ORHT 6 SPLT 77X108 (DRAPES) ×1 IMPLANT
DRAPE WARM FLUID 44X44 (DRAPES) IMPLANT
DRSG TEGADERM 4X4.75 (GAUZE/BANDAGES/DRESSINGS) ×2 IMPLANT
DRSG TELFA 3X8 NADH (GAUZE/BANDAGES/DRESSINGS) ×2 IMPLANT
DRSG TELFA 4X3 1S NADH ST (GAUZE/BANDAGES/DRESSINGS) ×4 IMPLANT
ELECT CAUTERY BLADE TIP 2.5 (TIP) ×2
ELECT COATED BLADE 2.86 ST (ELECTRODE) ×2 IMPLANT
ELECT REM PT RETURN 9FT ADLT (ELECTROSURGICAL) ×2
ELECTRODE CAUTERY BLDE TIP 2.5 (TIP) ×1 IMPLANT
ELECTRODE REM PT RTRN 9FT ADLT (ELECTROSURGICAL) ×1 IMPLANT
GAUZE 4X4 16PLY ~~LOC~~+RFID DBL (SPONGE) ×10 IMPLANT
GAUZE VASELINE 3X9 (GAUZE/BANDAGES/DRESSINGS) ×2 IMPLANT
GLOVE SURG SYN 6.5 ES PF (GLOVE) ×6 IMPLANT
GLOVE SURG SYN 8.5  E (GLOVE) ×3
GLOVE SURG SYN 8.5 E (GLOVE) ×3 IMPLANT
GLOVE SURG UNDER POLY LF SZ6.5 (GLOVE) ×2 IMPLANT
GOWN SRG LRG LVL 4 IMPRV REINF (GOWNS) ×1 IMPLANT
GOWN SRG XL LVL 3 NONREINFORCE (GOWNS) IMPLANT
GOWN STRL NON-REIN TWL XL LVL3 (GOWNS)
GOWN STRL REIN LRG LVL4 (GOWNS) ×1
GOWN STRL REUS W/ TWL LRG LVL3 (GOWN DISPOSABLE) ×1 IMPLANT
GOWN STRL REUS W/TWL LRG LVL3 (GOWN DISPOSABLE) ×1
GRADUATE 1200CC STRL 31836 (MISCELLANEOUS) ×2 IMPLANT
HEMOSTAT SURGICEL 2X14 (HEMOSTASIS) IMPLANT
KIT DRAIN CSF ACCUDRAIN (MISCELLANEOUS) ×2 IMPLANT
KIT TURNOVER KIT A (KITS) ×2 IMPLANT
L-HOOK LAP DISP 36CM (ELECTROSURGICAL)
LHOOK LAP DISP 36CM (ELECTROSURGICAL) IMPLANT
MANIFOLD NEPTUNE II (INSTRUMENTS) ×2 IMPLANT
MARKER SKIN DUAL TIP RULER LAB (MISCELLANEOUS) ×4 IMPLANT
MAT ABSORB  FLUID 56X50 GRAY (MISCELLANEOUS) ×1
MAT ABSORB FLUID 56X50 GRAY (MISCELLANEOUS) ×1 IMPLANT
NDL INSUFF ACCESS 14 VERSASTEP (NEEDLE) IMPLANT
NEEDLE HYPO 22GX1.5 SAFETY (NEEDLE) ×2 IMPLANT
NEEDLE INSUFFLATION 14GA 120MM (NEEDLE) ×2 IMPLANT
NS IRRIG 1000ML POUR BTL (IV SOLUTION) ×4 IMPLANT
PACK CRANIOTOMY CUSTOM (CUSTOM PROCEDURE TRAY) ×2 IMPLANT
PACK HEAD/NECK (MISCELLANEOUS) ×2 IMPLANT
PACK LAMINECTOMY NEURO (CUSTOM PROCEDURE TRAY) ×2 IMPLANT
PAD ARMBOARD 7.5X6 YLW CONV (MISCELLANEOUS) ×4 IMPLANT
PASSER CATH 65CM DISP (NEUROSURGERY SUPPLIES) IMPLANT
PASSER CATH SHUNT 55CM (INSTRUMENTS) IMPLANT
SET CATH VENT DRAIN 3-15 1.9D (DRAIN) ×2 IMPLANT
SET TUBE SMOKE EVAC HIGH FLOW (TUBING) ×2 IMPLANT
SHEATH PERITONEAL INTRO 61 (SHEATH) IMPLANT
SHEET NEURO XL SOL CTL (MISCELLANEOUS) IMPLANT
SLEEVE ENDOPATH XCEL 5M (ENDOMECHANICALS) ×2 IMPLANT
SPONGE DRAIN TRACH 4X4 STRL 2S (GAUZE/BANDAGES/DRESSINGS) ×2 IMPLANT
STAPLER SKIN PROX 35W (STAPLE) ×10 IMPLANT
STYLET DISP PRECALIBRATE (MISCELLANEOUS) IMPLANT
SURGIFLO W/THROMBIN 8M KIT (HEMOSTASIS) IMPLANT
SUT DVC VLOC 3-0 CL 6 P-12 (SUTURE) ×2 IMPLANT
SUT MNCRL 4-0 (SUTURE) ×2
SUT MNCRL 4-0 27XMFL (SUTURE) ×2
SUT MNCRL AB 3-0 PS2 27 (SUTURE) IMPLANT
SUT POLYSORB 3-0 18 V-20 (SUTURE) IMPLANT
SUT SILK 2 0 (SUTURE) ×1
SUT SILK 2-0 18XBRD TIE 12 (SUTURE) ×1 IMPLANT
SUT SILK 3-0 (SUTURE) ×2 IMPLANT
SUT V-LOC 90 ABS 3-0 VLT  V-20 (SUTURE) ×1
SUT V-LOC 90 ABS 3-0 VLT V-20 (SUTURE) ×1 IMPLANT
SUT VIC AB 2-0 CT1 18 (SUTURE) ×4 IMPLANT
SUT VICRYL 2-0 SH 8X27 (SUTURE) ×4 IMPLANT
SUTURE MNCRL 4-0 27XMF (SUTURE) ×2 IMPLANT
TAPE CLOTH 3X10 WHT NS LF (GAUZE/BANDAGES/DRESSINGS) ×4 IMPLANT
TOWEL OR 17X26 4PK STRL BLUE (TOWEL DISPOSABLE) ×14 IMPLANT
TRAY FOLEY MTR SLVR 16FR STAT (SET/KITS/TRAYS/PACK) IMPLANT
TROCAR XCEL NON-BLD 5MMX100MML (ENDOMECHANICALS) ×2 IMPLANT
TUBING CONNECTING 10 (TUBING) ×2 IMPLANT
WATER STERILE IRR 500ML POUR (IV SOLUTION) ×2 IMPLANT

## 2021-05-21 NOTE — Anesthesia Preprocedure Evaluation (Signed)
Anesthesia Evaluation  Patient identified by MRN, date of birth, ID band Patient awake    Reviewed: Allergy & Precautions, H&P , NPO status , Patient's Chart, lab work & pertinent test results, reviewed documented beta blocker date and time   Airway Mallampati: II  TM Distance: >3 FB Neck ROM: full    Dental  (+) Teeth Intact   Pulmonary neg pulmonary ROS,    Pulmonary exam normal        Cardiovascular negative cardio ROS Normal cardiovascular exam Rhythm:regular Rate:Normal     Neuro/Psych Change in mental status with ct revealing above. ja negative neurological ROS  negative psych ROS   GI/Hepatic negative GI ROS, Neg liver ROS,   Endo/Other  negative endocrine ROS  Renal/GU negative Renal ROS  negative genitourinary   Musculoskeletal   Abdominal   Peds  Hematology negative hematology ROS (+)   Anesthesia Other Findings History reviewed. No pertinent past medical history. History reviewed. No pertinent surgical history. BMI    Body Mass Index: 40.24 kg/m     Reproductive/Obstetrics negative OB ROS                             Anesthesia Physical Anesthesia Plan  ASA: 4 and emergent  Anesthesia Plan: General ETT   Post-op Pain Management:    Induction:   PONV Risk Score and Plan:   Airway Management Planned: Video Laryngoscope Planned  Additional Equipment:   Intra-op Plan:   Post-operative Plan:   Informed Consent: I have reviewed the patients History and Physical, chart, labs and discussed the procedure including the risks, benefits and alternatives for the proposed anesthesia with the patient or authorized representative who has indicated his/her understanding and acceptance.     Dental Advisory Given  Plan Discussed with: CRNA  Anesthesia Plan Comments:         Anesthesia Quick Evaluation

## 2021-05-21 NOTE — ED Provider Notes (Signed)
[facility] Emergency Department Provider Note  ____________________________________________   Event Date/Time   First MD Initiated Contact with Patient 05/21/21 1907     (approximate)  I have reviewed the triage vital signs and the nursing notes.   HISTORY  Chief Complaint Altered Mental Status    HPI Desiree Mitchell is a 65 y.o. female patient comes in by EMS being found unresponsive by family.  Patient was last seen normal Saturday at 4 PM.  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  On EMS arrival patient was unresponsive hypoxic in the 70s with a normal sugar.  Difficulty getting a blood pressure.  IO was placed in the right humeral head.  Patient was given 300 mL of LR, 40 mg lidocaine and 25 mg of Phenergan.  Patient is initially unresponsive but is able to wake up to voice and say her name.  She is able to move the right side of her body but not the left side.  Unable to get full HPI due to altered mental status    Medical: I was able to review her prior records and she has had prior stroke with left-sided deficits     Allergies No known allergies  Social History No drugs or alcohol per family   Review of Systems Unable to get full HPI due to altered mental status ____________________________________________   PHYSICAL EXAM:  VITAL SIGNS: Blood pressure (!) 110/93, pulse (!) 102, temperature (!) 94.9 F (34.9 C), resp. rate 20, height 5\' 2"  (1.575 m), weight 99.8 kg, SpO2 96 %.  Constitutional: Alert and oriented x1 ill, Eyes: Conjunctivae are normal.  Pupils are small but sluggishly reactive bilaterally Head: Atraumatic. Nose: No congestion/rhinnorhea. Mouth/Throat: Mucous membranes are moist.   Neck: No stridor. Trachea Midline. FROM.  She is got some abrasions on the left side of her neck from where she was leaning up against a basket Cardiovascular: Normal rate, regular  rhythm. Grossly normal heart sounds.  Good peripheral circulation. Respiratory: Normal respiratory effort.  No retractions. Lungs CTAB. Gastrointestinal: Soft and nontender. No distention. No abdominal bruits.  Musculoskeletal: No lower extremity tenderness nor edema.  No joint effusions.   Neurologic: Opens her eyes to voice and is able to mumble her name patient able to squeeze her right arm and wiggle her right toes but not able to do much on the left Skin:  Skin is warm, dry and intact. No rash noted. Psychiatric: Unable to fully assess GU: Deferred   ____________________________________________   LABS (all labs ordered are listed, but only abnormal results are displayed)  Labs Reviewed  CULTURE, BLOOD (ROUTINE X 2)  CULTURE, BLOOD (ROUTINE X 2)  RESP PANEL BY RT-PCR (FLU A&B, COVID) ARPGX2  CBC WITH DIFFERENTIAL/PLATELET  COMPREHENSIVE METABOLIC PANEL  URINE DRUG SCREEN, QUALITATIVE ([facility] ONLY)  URINALYSIS, ROUTINE W REFLEX MICROSCOPIC  CK  TSH  T4, FREE  LACTIC ACID, PLASMA  LACTIC ACID, PLASMA  BLOOD GAS, VENOUS  ETHANOL  SALICYLATE LEVEL  ACETAMINOPHEN LEVEL  AMMONIA  TROPONIN I (HIGH SENSITIVITY)   ____________________________________________   ED ECG REPORT I, Vanessa Jenera, the attending physician, personally viewed and interpreted this ECG.  Normal sinus rhythm 96, no ST elevation, no T wave inversions, normal intervals ____________________________________________  RADIOLOGY   Official radiology report(s): CT HEAD WO CONTRAST (5MM)  Result Date: 05/21/2021 CLINICAL DATA:  Found unresponsive.  VP shunt in place. EXAM: CT HEAD WITHOUT CONTRAST CT CERVICAL SPINE  WITHOUT CONTRAST CT CHEST, ABDOMEN AND PELVIS WITHOUT CONTRAST TECHNIQUE: Contiguous axial images were obtained from the base of the skull through the vertex without intravenous contrast. Multidetector CT imaging of the cervical spine was performed without intravenous contrast. Multiplanar CT image  reconstructions were also generated. Multidetector CT imaging of the chest, abdomen and pelvis was performed following the standard protocol without IV contrast. COMPARISON:  Pet CT 01/16/2011, chest x-ray 11/05/2012, CT miscellaneous 10/03/2011 FINDINGS: CT HEAD FINDINGS BRAIN: BRAIN Left posterior approach ventriculoperitoneal shunt with tip terminating midline along the anterior left lateral ventricle. Cerebral volume loss as well as interval development of mild hydrocephalus with surrounding periventricular edema. Patchy and confluent areas of decreased attenuation are noted throughout the deep and periventricular white matter of the cerebral hemispheres bilaterally, compatible with chronic microvascular ischemic disease. No evidence of large-territorial acute infarction. No parenchymal hemorrhage. No mass lesion. No extra-axial collection. No mass effect or midline shift. No hydrocephalus. Basilar cisterns are patent. Vascular: No hyperdense vessel. Atherosclerotic calcifications are present within the cavernous internal carotid arteries. Skull: No acute fracture or focal lesion. Sinuses/Orbits: Paranasal sinuses and mastoid air cells are clear. The orbits are unremarkable. Other: None. CT CERVICAL FINDINGS Alignment: Normal. Skull base and vertebrae: No acute fracture. No aggressive appearing focal osseous lesion or focal pathologic process. Soft tissues and spinal canal: No prevertebral fluid or swelling. No visible canal hematoma. Upper chest: Unremarkable. Other: Left ventricular peritoneal shunt is noted to coil within the soft tissues of the neck. CT CHEST: Ports and Devices: Right chest wall Port-A-Cath with tip at the level of the superior cavoatrial junction. Left VA shunt terminates in the anterior mediastinum and no longer within the left brachiocephalic vein as noted on CT miscellaneous 10/03/2011. Lungs/airways: Ground-glass airspace opacity within the right lower lobe and patchy airspace opacity  within left lower lobe. No focal consolidation. No pulmonary nodule. No pulmonary mass. No pulmonary contusion or laceration. No pneumatocele formation. The central airways are patent. Pleura: No pleural effusion. No pneumothorax. No hemothorax. Lymph Nodes: Limited evaluation for hilar lymphadenopathy on this noncontrast study. No mediastinal or axillary lymphadenopathy. Right axillary surgical clips. Mediastinum: No pneumomediastinum. No fat stranding within the mediastinum. No hematoma formation. The thoracic aorta is normal in caliber. The heart is normal in size. No significant pericardial effusion. The esophagus is unremarkable. Interval development of an enlarged right thyroid gland with limited evaluation due to streak artifact. Chest Wall / Breasts: No chest wall mass. Musculoskeletal: No acute rib or sternal fracture. No spinal fracture. CT ABDOMEN / PELVIS: Liver: Not enlarged. No focal lesion. Biliary System: Limited evaluation due to motion artifact. The gallbladder is otherwise unremarkable with no radio-opaque gallstones. No biliary ductal dilatation. Pancreas: Normal pancreatic contour. No main pancreatic duct dilatation. Spleen: Not enlarged. No focal lesion. Adrenal Glands: No nodularity bilaterally. Kidneys: No hydroureteronephrosis. No nephroureterolithiasis. No contour deforming renal mass. Foley catheter terminates within the urinary bladder lumen. The urinary bladder is decompressed. Bowel: No small or large bowel wall thickening or dilatation. The appendix is unremarkable. Mesentery, Omentum, and Peritoneum: No simple free fluid ascites. No pneumoperitoneum. No mesenteric hematoma identified. No organized fluid collection. Pelvic Organs: The uterus and bilateral adnexal regions are unremarkable. Lymph Nodes: No abdominal, pelvic, inguinal lymphadenopathy. Vasculature: No abdominal aorta or iliac aneurysm. Musculoskeletal: No significant soft tissue hematoma. No acute pelvic fracture. No  spinal fracture. IMPRESSION: 1. Interval development of mild hydrocephalus and periventricular edema. Associated malpositioned left VA shunt that terminates in the anterior mediastinum and no  longer within the left brachiocephalic vein as noted on CT miscellaneous 10/03/2011. The shunt is noted to be coiled within the neck. Recommend neurosurgery consultation. 2. No acute displaced fracture or traumatic listhesis of the cervical spine. 3. Ground-glass airspace opacity within the right lower lobe and patchy airspace opacity within left lower lobe. Findings may represent a combination of infection/inflammation as well as atelectasis. 4. No acute traumatic injury to the chest, abdomen, or pelvis with limited evaluation on this noncontrast study. 5. No acute fracture or traumatic malalignment of the thoracic or lumbar spine. 6.  Aortic Atherosclerosis (ICD10-I70.0). 7. Interval development of an enlarged right thyroid gland with limited evaluation due to streak artifact. Recommend thyroid ultrasound (ref: J Am Coll Radiol. 2015 Feb;12(2): 143-50). These results were called by telephone at the time of interpretation on 05/21/2021 at 9:48 pm to provider [facility] , who verbally acknowledged these results. Electronically Signed   By: Iven Finn M.D.   On: 05/21/2021 21:54   CT Cervical Spine Wo Contrast  Result Date: 05/21/2021 CLINICAL DATA:  Found unresponsive.  VP shunt in place. EXAM: CT HEAD WITHOUT CONTRAST CT CERVICAL SPINE WITHOUT CONTRAST CT CHEST, ABDOMEN AND PELVIS WITHOUT CONTRAST TECHNIQUE: Contiguous axial images were obtained from the base of the skull through the vertex without intravenous contrast. Multidetector CT imaging of the cervical spine was performed without intravenous contrast. Multiplanar CT image reconstructions were also generated. Multidetector CT imaging of the chest, abdomen and pelvis was performed following the standard protocol without IV contrast. COMPARISON:  Pet CT  01/16/2011, chest x-ray 11/05/2012, CT miscellaneous 10/03/2011 FINDINGS: CT HEAD FINDINGS BRAIN: BRAIN Left posterior approach ventriculoperitoneal shunt with tip terminating midline along the anterior left lateral ventricle. Cerebral volume loss as well as interval development of mild hydrocephalus with surrounding periventricular edema. Patchy and confluent areas of decreased attenuation are noted throughout the deep and periventricular white matter of the cerebral hemispheres bilaterally, compatible with chronic microvascular ischemic disease. No evidence of large-territorial acute infarction. No parenchymal hemorrhage. No mass lesion. No extra-axial collection. No mass effect or midline shift. No hydrocephalus. Basilar cisterns are patent. Vascular: No hyperdense vessel. Atherosclerotic calcifications are present within the cavernous internal carotid arteries. Skull: No acute fracture or focal lesion. Sinuses/Orbits: Paranasal sinuses and mastoid air cells are clear. The orbits are unremarkable. Other: None. CT CERVICAL FINDINGS Alignment: Normal. Skull base and vertebrae: No acute fracture. No aggressive appearing focal osseous lesion or focal pathologic process. Soft tissues and spinal canal: No prevertebral fluid or swelling. No visible canal hematoma. Upper chest: Unremarkable. Other: Left ventricular peritoneal shunt is noted to coil within the soft tissues of the neck. CT CHEST: Ports and Devices: Right chest wall Port-A-Cath with tip at the level of the superior cavoatrial junction. Left VA shunt terminates in the anterior mediastinum and no longer within the left brachiocephalic vein as noted on CT miscellaneous 10/03/2011. Lungs/airways: Ground-glass airspace opacity within the right lower lobe and patchy airspace opacity within left lower lobe. No focal consolidation. No pulmonary nodule. No pulmonary mass. No pulmonary contusion or laceration. No pneumatocele formation. The central airways are  patent. Pleura: No pleural effusion. No pneumothorax. No hemothorax. Lymph Nodes: Limited evaluation for hilar lymphadenopathy on this noncontrast study. No mediastinal or axillary lymphadenopathy. Right axillary surgical clips. Mediastinum: No pneumomediastinum. No fat stranding within the mediastinum. No hematoma formation. The thoracic aorta is normal in caliber. The heart is normal in size. No significant pericardial effusion. The esophagus is unremarkable. Interval development of an  enlarged right thyroid gland with limited evaluation due to streak artifact. Chest Wall / Breasts: No chest wall mass. Musculoskeletal: No acute rib or sternal fracture. No spinal fracture. CT ABDOMEN / PELVIS: Liver: Not enlarged. No focal lesion. Biliary System: Limited evaluation due to motion artifact. The gallbladder is otherwise unremarkable with no radio-opaque gallstones. No biliary ductal dilatation. Pancreas: Normal pancreatic contour. No main pancreatic duct dilatation. Spleen: Not enlarged. No focal lesion. Adrenal Glands: No nodularity bilaterally. Kidneys: No hydroureteronephrosis. No nephroureterolithiasis. No contour deforming renal mass. Foley catheter terminates within the urinary bladder lumen. The urinary bladder is decompressed. Bowel: No small or large bowel wall thickening or dilatation. The appendix is unremarkable. Mesentery, Omentum, and Peritoneum: No simple free fluid ascites. No pneumoperitoneum. No mesenteric hematoma identified. No organized fluid collection. Pelvic Organs: The uterus and bilateral adnexal regions are unremarkable. Lymph Nodes: No abdominal, pelvic, inguinal lymphadenopathy. Vasculature: No abdominal aorta or iliac aneurysm. Musculoskeletal: No significant soft tissue hematoma. No acute pelvic fracture. No spinal fracture. IMPRESSION: 1. Interval development of mild hydrocephalus and periventricular edema. Associated malpositioned left VA shunt that terminates in the anterior  mediastinum and no longer within the left brachiocephalic vein as noted on CT miscellaneous 10/03/2011. The shunt is noted to be coiled within the neck. Recommend neurosurgery consultation. 2. No acute displaced fracture or traumatic listhesis of the cervical spine. 3. Ground-glass airspace opacity within the right lower lobe and patchy airspace opacity within left lower lobe. Findings may represent a combination of infection/inflammation as well as atelectasis. 4. No acute traumatic injury to the chest, abdomen, or pelvis with limited evaluation on this noncontrast study. 5. No acute fracture or traumatic malalignment of the thoracic or lumbar spine. 6.  Aortic Atherosclerosis (ICD10-I70.0). 7. Interval development of an enlarged right thyroid gland with limited evaluation due to streak artifact. Recommend thyroid ultrasound (ref: J Am Coll Radiol. 2015 Feb;12(2): 143-50). These results were called by telephone at the time of interpretation on 05/21/2021 at 9:48 pm to provider [facility] , who verbally acknowledged these results. Electronically Signed   By: Iven Finn M.D.   On: 05/21/2021 21:54   DG Chest Portable 1 View  Result Date: 05/21/2021 CLINICAL DATA:  Found unresponsive. EXAM: PORTABLE CHEST 1 VIEW COMPARISON:  None. FINDINGS: A right-sided venous Port-A-Cath is seen with its distal tip noted at the junction of the superior vena cava and right atrium. Intact shunt tubing is seen overlying the left neck soft tissues and left upper lung. Very mild atelectasis is seen within the left lung base. There is no evidence of acute infiltrate, pleural effusion or pneumothorax. The heart size and mediastinal contours are within normal limits. The visualized skeletal structures are unremarkable. IMPRESSION: 1. Very mild left basilar atelectasis without evidence of an acute infiltrate. Electronically Signed   By: Virgina Norfolk M.D.   On: 05/21/2021 19:32   CT CHEST ABDOMEN PELVIS WO CONTRAST  Result  Date: 05/21/2021 CLINICAL DATA:  Found unresponsive.  VP shunt in place. EXAM: CT HEAD WITHOUT CONTRAST CT CERVICAL SPINE WITHOUT CONTRAST CT CHEST, ABDOMEN AND PELVIS WITHOUT CONTRAST TECHNIQUE: Contiguous axial images were obtained from the base of the skull through the vertex without intravenous contrast. Multidetector CT imaging of the cervical spine was performed without intravenous contrast. Multiplanar CT image reconstructions were also generated. Multidetector CT imaging of the chest, abdomen and pelvis was performed following the standard protocol without IV contrast. COMPARISON:  Pet CT 01/16/2011, chest x-ray 11/05/2012, CT miscellaneous 10/03/2011 FINDINGS: CT HEAD  FINDINGS BRAIN: BRAIN Left posterior approach ventriculoperitoneal shunt with tip terminating midline along the anterior left lateral ventricle. Cerebral volume loss as well as interval development of mild hydrocephalus with surrounding periventricular edema. Patchy and confluent areas of decreased attenuation are noted throughout the deep and periventricular white matter of the cerebral hemispheres bilaterally, compatible with chronic microvascular ischemic disease. No evidence of large-territorial acute infarction. No parenchymal hemorrhage. No mass lesion. No extra-axial collection. No mass effect or midline shift. No hydrocephalus. Basilar cisterns are patent. Vascular: No hyperdense vessel. Atherosclerotic calcifications are present within the cavernous internal carotid arteries. Skull: No acute fracture or focal lesion. Sinuses/Orbits: Paranasal sinuses and mastoid air cells are clear. The orbits are unremarkable. Other: None. CT CERVICAL FINDINGS Alignment: Normal. Skull base and vertebrae: No acute fracture. No aggressive appearing focal osseous lesion or focal pathologic process. Soft tissues and spinal canal: No prevertebral fluid or swelling. No visible canal hematoma. Upper chest: Unremarkable. Other: Left ventricular peritoneal  shunt is noted to coil within the soft tissues of the neck. CT CHEST: Ports and Devices: Right chest wall Port-A-Cath with tip at the level of the superior cavoatrial junction. Left VA shunt terminates in the anterior mediastinum and no longer within the left brachiocephalic vein as noted on CT miscellaneous 10/03/2011. Lungs/airways: Ground-glass airspace opacity within the right lower lobe and patchy airspace opacity within left lower lobe. No focal consolidation. No pulmonary nodule. No pulmonary mass. No pulmonary contusion or laceration. No pneumatocele formation. The central airways are patent. Pleura: No pleural effusion. No pneumothorax. No hemothorax. Lymph Nodes: Limited evaluation for hilar lymphadenopathy on this noncontrast study. No mediastinal or axillary lymphadenopathy. Right axillary surgical clips. Mediastinum: No pneumomediastinum. No fat stranding within the mediastinum. No hematoma formation. The thoracic aorta is normal in caliber. The heart is normal in size. No significant pericardial effusion. The esophagus is unremarkable. Interval development of an enlarged right thyroid gland with limited evaluation due to streak artifact. Chest Wall / Breasts: No chest wall mass. Musculoskeletal: No acute rib or sternal fracture. No spinal fracture. CT ABDOMEN / PELVIS: Liver: Not enlarged. No focal lesion. Biliary System: Limited evaluation due to motion artifact. The gallbladder is otherwise unremarkable with no radio-opaque gallstones. No biliary ductal dilatation. Pancreas: Normal pancreatic contour. No main pancreatic duct dilatation. Spleen: Not enlarged. No focal lesion. Adrenal Glands: No nodularity bilaterally. Kidneys: No hydroureteronephrosis. No nephroureterolithiasis. No contour deforming renal mass. Foley catheter terminates within the urinary bladder lumen. The urinary bladder is decompressed. Bowel: No small or large bowel wall thickening or dilatation. The appendix is unremarkable.  Mesentery, Omentum, and Peritoneum: No simple free fluid ascites. No pneumoperitoneum. No mesenteric hematoma identified. No organized fluid collection. Pelvic Organs: The uterus and bilateral adnexal regions are unremarkable. Lymph Nodes: No abdominal, pelvic, inguinal lymphadenopathy. Vasculature: No abdominal aorta or iliac aneurysm. Musculoskeletal: No significant soft tissue hematoma. No acute pelvic fracture. No spinal fracture. IMPRESSION: 1. Interval development of mild hydrocephalus and periventricular edema. 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 The shunt is noted to be coiled within the neck. Recommend neurosurgery consultation. 2. No acute displaced fracture or traumatic listhesis of the cervical spine. 3. Ground-glass airspace opacity within the right lower lobe and patchy airspace opacity within left lower lobe. Findings may represent a combination of infection/inflammation as well as atelectasis. 4. No acute traumatic injury to the chest, abdomen, or pelvis with limited evaluation  on this noncontrast study. 5. No acute fracture or traumatic malalignment of the thoracic or lumbar spine. 6.  Aortic Atherosclerosis (ICD10-I70.0). 7. Interval development of an enlarged right thyroid gland with limited evaluation due to streak artifact. Recommend thyroid ultrasound (ref: J Am Coll Radiol. 2015 Feb;12(2): 143-50). These results were called by telephone at the time of interpretation on 05/21/2021 at 9:48 pm to provider [facility] , who verbally acknowledged these results. Electronically Signed   By: Iven Finn M.D.   On: 05/21/2021 21:54    ____________________________________________   PROCEDURES  Procedure(s) performed (including Critical Care):  .1-3 Lead EKG Interpretation Performed by: Vanessa Glen Ullin, MD Authorized by: Vanessa [place], MD      Interpretation: normal     ECG rate:  90s   ECG rate assessment: normal     Rhythm: sinus rhythm     Ectopy: none     Conduction: normal   .Critical Care Performed by: Vanessa [place], MD Authorized by: Vanessa Orleans, MD   Critical care provider statement:    Critical care time (minutes):  45   Critical care was necessary to treat or prevent imminent or life-threatening deterioration of the following conditions:  Sepsis   Critical care was time spent personally by me on the following activities:  Blood draw for specimens, discussions with consultants, evaluation of patient's response to treatment, examination of patient, interpretation of cardiac output measurements, obtaining history from patient or surrogate, pulse oximetry, ordering and review of radiographic studies, ordering and review of laboratory studies, re-evaluation of patient's condition and review of old charts   I assumed direction of critical care for this patient from another provider in my specialty: yes     Care discussed with: admitting provider     ____________________________________________   INITIAL IMPRESSION / ASSESSMENT AND PLAN / ED COURSE  Desiree Mitchell was evaluated in Emergency Department on 05/21/2021 for the symptoms described in the history of present illness. She was evaluated in the context of the global COVID-19 pandemic, which necessitated consideration that the patient might be at risk for infection with the SARS-CoV-2 virus that causes COVID-19. Institutional protocols and algorithms that pertain to the evaluation of patients at risk for COVID-19 are in a state of rapid change based on information released by regulatory bodies including the CDC and federal and state organizations. These policies and algorithms were followed during the patient's care in the ED.    Patient is an significantly altered but is able to follow commands and wake up to name and move the right side of her body.  Initially was  some concern that she had had a prior stroke with right-sided deficits and that she was having new left-sided deficits but I did confirm on her prior charts that she had baseline left-sided deficits.  Unknown last known normal therefore stroke code not called.  Patient significantly hypothermic hypotensive.  White count came back significantly elevated so patient had a sepsis alert and start on broad-spectrum antibiotics.  Initially was going to order CT scans with contrast to evaluate for any LVO or other causes of her elevated white count but unfortunately her creatinine was significantly elevated so I did a CT without contrast.  CT scans were concerning for the potential of shunt malfunction.  I did consult Dr. Cari Caraway who is going to come in for fixing her shunt.  Unclear why her white count still elevated she is hypothermic but this could be from her being altered and  sitting on the toilet for 2 days.  Family later stated that they found her just sitting on a close toilet with her neck up against a basket.  Patient still able to wake up to her name on reassessment.  Blood pressures are better after the fluids.  Will discuss with ICU for admission       ____________________________________________   FINAL CLINICAL IMPRESSION(S) / ED DIAGNOSES   Final diagnoses:  Fall  Shunt malfunction, initial encounter  Sepsis, due to unspecified organism, unspecified whether acute organ dysfunction present [facility])      MEDICATIONS GIVEN DURING THIS VISIT:  Medications  naloxone (NARCAN) injection 0.4 mg (0.4 mg Intravenous Not Given 05/21/21 2053)  metroNIDAZOLE (FLAGYL) IVPB 500 mg (500 mg Intravenous New Bag/Given 05/21/21 2214)  vancomycin (VANCOREADY) IVPB 2000 mg/400 mL (has no administration in time range)  sodium chloride 0.9 % bolus 1,000 mL (0 mLs Intravenous Stopped 05/21/21 2113)  ceFEPIme (MAXIPIME) 2 g in sodium chloride 0.9 % 100 mL IVPB (0 g Intravenous Stopped 05/21/21 2210)   sodium chloride 0.9 % bolus 1,000 mL (1,000 mLs Intravenous New Bag/Given 05/21/21 2132)  Tdap (BOOSTRIX) injection 0.5 mL (0.5 mLs Intramuscular Given 05/21/21 2133)     ED Discharge Orders     None        Note:  This document was prepared using Dragon voice recognition software and may include unintentional dictation errors.    Vanessa Shickley, MD 05/21/21 [phone]

## 2021-05-21 NOTE — ED Notes (Signed)
Patient noted to be soiled of arrival.  When changing patient, bed bugs noted.  Patient cleansed with cleaning wipes.  New linens and gown applied

## 2021-05-21 NOTE — ED Notes (Signed)
Patient's family member at bedside.  Updated on plan of care.

## 2021-05-21 NOTE — Consult Note (Signed)
Referring Physician:  No referring provider defined for this encounter.  Primary Physician:  Pcp, No  Chief Complaint:  altered mental status (Level 5 qualifier)  History of Present Illness: 05/21/2021 Desiree Mitchell is a 65 y.o. female who presents with the chief complaint of found with alteration in mental status by family.  Last seen normal 2 days ago, found by grandson today sitting in her bathroom not at her baseline.  She had a subarachnoid hemorrhage more than a decade ago, and had a shunt placed for management of hydrocephalus.  This was revised to a VA shunt when she underwent treatment for breast cancer.  She has not had a revision since.  She presents today unable to provide any history. At her baseline she is able to live independently.    She also has signs of sepsis and has started antibiotics.  Review of Systems:  A 10 point review of systems is negative, except for the pertinent positives and negatives detailed in the HPI.  Past Medical History: History reviewed. No pertinent past medical history.  Past Surgical History: History reviewed. No pertinent surgical history.  Allergies: Allergies as of 05/21/2021   (No Known Allergies)    Medications:  Current Facility-Administered Medications:    metroNIDAZOLE (FLAGYL) IVPB 500 mg, 500 mg, Intravenous, Once, Desiree Plevna, MD, Last Rate: 100 mL/hr at 05/21/21 2214, 500 mg at 05/21/21 2214   naloxone [facility]) injection 0.4 mg, 0.4 mg, Intravenous, Once, Desiree Nunez, MD   vancomycin (VANCOREADY) IVPB 2000 mg/400 mL, 2,000 mg, Intravenous, Once, Desiree Mitchell, [place] Mitchell No current outpatient medications on file.   Social History:    Family Medical History: History reviewed. No pertinent family history.  Physical Examination: Vitals:   05/21/21 2200 05/21/21 2231  BP: (!) 127/103 (!) 110/93  Pulse: 99 (!) 102  Resp: 20 20  Temp: (!) 94.3 F (34.6 C) (!) 94.9 F (34.9 C)  SpO2: 97% 96%   Heart sounds  normal no MRG. Chest Clear to Auscultation Bilaterally.   General: Patient is poorly responsive.  Psychiatric: Patient is not very interactive.  Head:  Pupils equal, round, and reactive to light.  ENT:  Oral mucosa appears well hydrated.  Neck:   Supple.  Full range of motion.  Respiratory: Patient is sleeping and snoring.  Extremities: No edema.  Vascular: Palpable pulses in dorsal pedal vessels.  Skin:   On exposed skin, there are no abnormal skin lesions.  NEUROLOGICAL:  General: In no acute distress.   Sleeping, snoring.  OE to stimulation and can say her name.  No other orientation.  Pupils equal round and reactive to light.  Facial tone is symmetric.  Does not protrude tongue or participate in drift.    Strength  Moves R to stimulation at least 4/5.  L side less mobile but still at least 3/5 grossly.  Does not cooperate with individual muscle groups or with sensory examination.   She does not localize well.  Reflexes 2+  Gait is untested  Imaging: CT Head and CAP 05/21/21 IMPRESSION: 1. Interval development of mild hydrocephalus and periventricular edema. 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 The shunt is noted to be coiled within the neck. Recommend neurosurgery consultation. 2. No acute displaced fracture or traumatic listhesis of the cervical spine. 3. Ground-glass airspace opacity within the right lower lobe and patchy airspace opacity within left lower  lobe. Findings may represent a combination of infection/inflammation as well as atelectasis. 4. No acute traumatic injury to the chest, abdomen, or pelvis with limited evaluation on this noncontrast study.   5. No acute fracture or traumatic malalignment of the thoracic or lumbar spine. 6.  Aortic Atherosclerosis (ICD10-I70.0). 7. Interval development of an enlarged  right thyroid gland with limited evaluation due to streak artifact. Recommend thyroid ultrasound (ref: J Am Coll Radiol. 2015 Feb;12(2): 143-50).   These results were called by telephone at the time of interpretation on 05/21/2021 at 9:48 pm to provider Desiree Mitchell , who verbally acknowledged these results.     Electronically Signed   By: Desiree Mitchell M.D.   On: 05/21/2021 21:54    I have personally reviewed the images and agree with the above interpretation.  Labs: CBC Latest Ref Rng & Units 05/21/2021  WBC 4.0 - 10.5 K/uL 30.3(H)  Hemoglobin 12.0 - 15.0 g/dL 15.9(H)  Hematocrit 36.0 - 46.0 % 49.0(H)  Platelets 150 - 400 K/uL 491(H)       Assessment and Plan: [name] is a pleasant 65 y.o. female with signs of sepsis and altered mental status off of baseline.  Her current GCS is E2V3M4=9.  She has clear change in ventricular size compared to prior imaging in 2014, and has distal catheter that is not at the SVC-atrial junction.  Though she may have more going on than a simple shunt malfunction, her clinical presentation is very concerning given the increased size of her ventricles and her loss of mental status.  I have reviewed this with her son and daughter, stating that I cannot rule out that other factors are also contributing to her presentation, or that they are primary explanatory factors for her presentation.    I recommended removal of her shunt and placement of an external ventricular drain.    I discussed the planned procedure at length with the patient's daughter and son, including the risks, benefits, alternatives, and indications. The risks discussed include but are not limited to bleeding, infection, need for reoperation, spinal fluid leak, stroke, vision loss, anesthetic complication, coma, paralysis, and even death. I also described in detail that improvement was not guaranteed, and that it is possible her other medical issues including sepsis are the primary  issues currently.  The patient's son and daughter expressed understanding of these risks, and asked that we proceed with surgery. I described the surgery in layman's terms, and gave ample opportunity for questions, which were answered to the best of my ability.  We will proceed urgently to the operating room.    Rayshawn Maney K. Izora Ribas MD, [place] Dept. of Neurosurgery

## 2021-05-21 NOTE — ED Notes (Signed)
Patient taken to CT at this time.

## 2021-05-21 NOTE — Progress Notes (Signed)
Following for sepsis monitoring ?

## 2021-05-21 NOTE — ED Triage Notes (Signed)
Pt arrives to ER from home via [facility] with c/c of being found unresponsive by family. Last seen normal Saturday at 1600 by family. Pt with Hx of stroke in 2007 with ventral shunt in place. EMS reports pt responsive to pain. Unable to obtain BP or O2 sat en route. P 98, CBG 122. EMS placed IO line in right humeral. Pt received 300 mL LR, 40mg  lidocaine, 25mg  phenergan. Upon arrival, pt initially unresponsive but did rouse to painful stimuli and able to give simple responses to Dr Jari Pigg at bedside.

## 2021-05-21 NOTE — ED Notes (Signed)
Patient will open eyes to voice at this time.  Will make eye contact and can shake head intermittent to questions

## 2021-05-21 NOTE — Consult Note (Signed)
PHARMACY -  BRIEF ANTIBIOTIC NOTE   Pharmacy has received consult(s) for cefepime and vancomycin from an ED provider.  The patient's profile has been reviewed for ht/wt/allergies/indication/available labs.    One time order(s) placed for  Cefepime 2 gram Vancomycin 2 gram  Further antibiotics/pharmacy consults should be ordered by admitting physician if indicated.                       Thank you, Dorothe Pea, PharmD, BCPS Clinical Pharmacist   05/21/2021  8:29 PM

## 2021-05-21 NOTE — Consult Note (Signed)
CODE SEPSIS - PHARMACY COMMUNICATION  **Broad Spectrum Antibiotics should be administered within 1 hour of Sepsis diagnosis**  Time Code Sepsis Called/Page Received: 2026  Antibiotics Ordered: 2026  Time of 1st antibiotic administration: 2132   If necessary, Name of Provider/Nurse Contacted: Natasha Bence, RN Provider wanted patient to have CT prior to Abx, slight delay in therapy    Dorothe Pea ,PharmD, BCPS Clinical Pharmacist  05/21/2021  8:29 PM

## 2021-05-22 ENCOUNTER — Encounter: Payer: Self-pay | Admitting: Neurosurgery

## 2021-05-22 ENCOUNTER — Inpatient Hospital Stay: Payer: Medicare (Managed Care)

## 2021-05-22 ENCOUNTER — Emergency Department: Payer: Medicare (Managed Care) | Admitting: Anesthesiology

## 2021-05-22 DIAGNOSIS — E8729 Other acidosis: Secondary | ICD-10-CM | POA: Diagnosis not present

## 2021-05-22 DIAGNOSIS — I639 Cerebral infarction, unspecified: Secondary | ICD-10-CM | POA: Diagnosis not present

## 2021-05-22 DIAGNOSIS — M6282 Rhabdomyolysis: Secondary | ICD-10-CM

## 2021-05-22 DIAGNOSIS — J45909 Unspecified asthma, uncomplicated: Secondary | ICD-10-CM | POA: Diagnosis present

## 2021-05-22 DIAGNOSIS — E870 Hyperosmolality and hypernatremia: Secondary | ICD-10-CM | POA: Diagnosis not present

## 2021-05-22 DIAGNOSIS — E872 Acidosis, unspecified: Secondary | ICD-10-CM | POA: Diagnosis present

## 2021-05-22 DIAGNOSIS — R6520 Severe sepsis without septic shock: Secondary | ICD-10-CM | POA: Diagnosis present

## 2021-05-22 DIAGNOSIS — Z515 Encounter for palliative care: Secondary | ICD-10-CM | POA: Diagnosis not present

## 2021-05-22 DIAGNOSIS — A419 Sepsis, unspecified organism: Secondary | ICD-10-CM | POA: Diagnosis present

## 2021-05-22 DIAGNOSIS — E669 Obesity, unspecified: Secondary | ICD-10-CM | POA: Diagnosis not present

## 2021-05-22 DIAGNOSIS — Z6841 Body Mass Index (BMI) 40.0 and over, adult: Secondary | ICD-10-CM | POA: Diagnosis not present

## 2021-05-22 DIAGNOSIS — I69354 Hemiplegia and hemiparesis following cerebral infarction affecting left non-dominant side: Secondary | ICD-10-CM | POA: Diagnosis not present

## 2021-05-22 DIAGNOSIS — I6340 Cerebral infarction due to embolism of unspecified cerebral artery: Secondary | ICD-10-CM | POA: Diagnosis not present

## 2021-05-22 DIAGNOSIS — L8990 Pressure ulcer of unspecified site, unspecified stage: Secondary | ICD-10-CM | POA: Diagnosis not present

## 2021-05-22 DIAGNOSIS — Z23 Encounter for immunization: Secondary | ICD-10-CM | POA: Diagnosis present

## 2021-05-22 DIAGNOSIS — J9601 Acute respiratory failure with hypoxia: Secondary | ICD-10-CM

## 2021-05-22 DIAGNOSIS — L89324 Pressure ulcer of left buttock, stage 4: Secondary | ICD-10-CM | POA: Diagnosis present

## 2021-05-22 DIAGNOSIS — R7401 Elevation of levels of liver transaminase levels: Secondary | ICD-10-CM | POA: Diagnosis not present

## 2021-05-22 DIAGNOSIS — L89312 Pressure ulcer of right buttock, stage 2: Secondary | ICD-10-CM | POA: Diagnosis not present

## 2021-05-22 DIAGNOSIS — T85618A Breakdown (mechanical) of other specified internal prosthetic devices, implants and grafts, initial encounter: Secondary | ICD-10-CM

## 2021-05-22 DIAGNOSIS — G936 Cerebral edema: Secondary | ICD-10-CM | POA: Diagnosis present

## 2021-05-22 DIAGNOSIS — F32A Depression, unspecified: Secondary | ICD-10-CM | POA: Diagnosis not present

## 2021-05-22 DIAGNOSIS — A411 Sepsis due to other specified staphylococcus: Secondary | ICD-10-CM | POA: Diagnosis present

## 2021-05-22 DIAGNOSIS — A4159 Other Gram-negative sepsis: Secondary | ICD-10-CM | POA: Diagnosis present

## 2021-05-22 DIAGNOSIS — Z7189 Other specified counseling: Secondary | ICD-10-CM | POA: Diagnosis not present

## 2021-05-22 DIAGNOSIS — I76 Septic arterial embolism: Secondary | ICD-10-CM | POA: Diagnosis not present

## 2021-05-22 DIAGNOSIS — Y92009 Unspecified place in unspecified non-institutional (private) residence as the place of occurrence of the external cause: Secondary | ICD-10-CM | POA: Diagnosis not present

## 2021-05-22 DIAGNOSIS — I6389 Other cerebral infarction: Secondary | ICD-10-CM | POA: Diagnosis not present

## 2021-05-22 DIAGNOSIS — N950 Postmenopausal bleeding: Secondary | ICD-10-CM | POA: Diagnosis not present

## 2021-05-22 DIAGNOSIS — G9340 Encephalopathy, unspecified: Secondary | ICD-10-CM

## 2021-05-22 DIAGNOSIS — Y831 Surgical operation with implant of artificial internal device as the cause of abnormal reaction of the patient, or of later complication, without mention of misadventure at the time of the procedure: Secondary | ICD-10-CM | POA: Diagnosis present

## 2021-05-22 DIAGNOSIS — Z20822 Contact with and (suspected) exposure to covid-19: Secondary | ICD-10-CM | POA: Diagnosis present

## 2021-05-22 DIAGNOSIS — Z66 Do not resuscitate: Secondary | ICD-10-CM | POA: Diagnosis not present

## 2021-05-22 DIAGNOSIS — A4150 Gram-negative sepsis, unspecified: Secondary | ICD-10-CM | POA: Diagnosis not present

## 2021-05-22 DIAGNOSIS — T8502XA Displacement of ventricular intracranial (communicating) shunt, initial encounter: Secondary | ICD-10-CM | POA: Diagnosis present

## 2021-05-22 DIAGNOSIS — J9600 Acute respiratory failure, unspecified whether with hypoxia or hypercapnia: Secondary | ICD-10-CM

## 2021-05-22 DIAGNOSIS — L89321 Pressure ulcer of left buttock, stage 1: Secondary | ICD-10-CM | POA: Diagnosis not present

## 2021-05-22 DIAGNOSIS — N179 Acute kidney failure, unspecified: Secondary | ICD-10-CM

## 2021-05-22 DIAGNOSIS — Z9911 Dependence on respirator [ventilator] status: Secondary | ICD-10-CM

## 2021-05-22 DIAGNOSIS — R0602 Shortness of breath: Secondary | ICD-10-CM | POA: Diagnosis not present

## 2021-05-22 DIAGNOSIS — Z978 Presence of other specified devices: Secondary | ICD-10-CM

## 2021-05-22 DIAGNOSIS — L89896 Pressure-induced deep tissue damage of other site: Secondary | ICD-10-CM | POA: Diagnosis present

## 2021-05-22 DIAGNOSIS — N939 Abnormal uterine and vaginal bleeding, unspecified: Secondary | ICD-10-CM | POA: Diagnosis not present

## 2021-05-22 DIAGNOSIS — G919 Hydrocephalus, unspecified: Secondary | ICD-10-CM | POA: Diagnosis present

## 2021-05-22 DIAGNOSIS — I10 Essential (primary) hypertension: Secondary | ICD-10-CM | POA: Diagnosis present

## 2021-05-22 DIAGNOSIS — G928 Other toxic encephalopathy: Secondary | ICD-10-CM | POA: Diagnosis present

## 2021-05-22 DIAGNOSIS — T85618D Breakdown (mechanical) of other specified internal prosthetic devices, implants and grafts, subsequent encounter: Secondary | ICD-10-CM | POA: Diagnosis not present

## 2021-05-22 LAB — BLOOD CULTURE ID PANEL (REFLEXED) - BCID2

## 2021-05-22 LAB — BLOOD GAS, ARTERIAL
Acid-base deficit: 11 mmol/L — ABNORMAL HIGH (ref 0.0–2.0)
Bicarbonate: 15.3 mmol/L — ABNORMAL LOW (ref 20.0–28.0)
FIO2: 60
MECHVT: 450 mL
O2 Saturation: 97 %
PEEP: 5 cmH2O
Patient temperature: 37
RATE: 20 resp/min
pCO2 arterial: 35 mmHg (ref 32.0–48.0)
pH, Arterial: 7.25 — ABNORMAL LOW (ref 7.350–7.450)
pO2, Arterial: 105 mmHg (ref 83.0–108.0)

## 2021-05-22 LAB — GLUCOSE, CAPILLARY
Glucose-Capillary: 101 mg/dL — ABNORMAL HIGH (ref 70–99)
Glucose-Capillary: 103 mg/dL — ABNORMAL HIGH (ref 70–99)
Glucose-Capillary: 104 mg/dL — ABNORMAL HIGH (ref 70–99)
Glucose-Capillary: 73 mg/dL (ref 70–99)
Glucose-Capillary: 87 mg/dL (ref 70–99)
Glucose-Capillary: 88 mg/dL (ref 70–99)
Glucose-Capillary: 91 mg/dL (ref 70–99)

## 2021-05-22 LAB — BASIC METABOLIC PANEL
Anion gap: 13 (ref 5–15)
BUN: 88 mg/dL — ABNORMAL HIGH (ref 8–23)
CO2: 18 mmol/L — ABNORMAL LOW (ref 22–32)
Calcium: 7.3 mg/dL — ABNORMAL LOW (ref 8.9–10.3)
Chloride: 109 mmol/L (ref 98–111)
Creatinine, Ser: 2.6 mg/dL — ABNORMAL HIGH (ref 0.44–1.00)
GFR, Estimated: 20 mL/min — ABNORMAL LOW (ref >60)
Glucose, Bld: 93 mg/dL (ref 70–99)
Potassium: 3.8 mmol/L (ref 3.5–5.1)
Sodium: 140 mmol/L (ref 135–145)

## 2021-05-22 LAB — CBC
HCT: 38.9 % (ref 36.0–46.0)
Hemoglobin: 13.4 g/dL (ref 12.0–15.0)
MCH: 32.8 pg (ref 26.0–34.0)
MCHC: 34.4 g/dL (ref 30.0–36.0)
MCV: 95.3 fL (ref 80.0–100.0)
Platelets: 353 10*3/uL (ref 150–400)
RBC: 4.08 MIL/uL (ref 3.87–5.11)
RDW: 13.6 % (ref 11.5–15.5)
WBC: 21.9 10*3/uL — ABNORMAL HIGH (ref 4.0–10.5)
nRBC: 0 % (ref 0.0–0.2)

## 2021-05-22 LAB — PROCALCITONIN: Procalcitonin: 1.28 ng/mL

## 2021-05-22 LAB — ETHANOL: Alcohol, Ethyl (B): <10 mg/dL (ref <10)

## 2021-05-22 LAB — TROPONIN I (HIGH SENSITIVITY): Troponin I (High Sensitivity): 86 ng/L — ABNORMAL HIGH (ref <18)

## 2021-05-22 LAB — ACETAMINOPHEN LEVEL: Acetaminophen (Tylenol), Serum: <10 ug/mL — ABNORMAL LOW (ref 10–30)

## 2021-05-22 LAB — SALICYLATE LEVEL: Salicylate Lvl: <7 mg/dL — ABNORMAL LOW (ref 7.0–30.0)

## 2021-05-22 LAB — MAGNESIUM: Magnesium: 2.4 mg/dL (ref 1.7–2.4)

## 2021-05-22 LAB — LACTIC ACID, PLASMA
Lactic Acid, Venous: 1.5 mmol/L (ref 0.5–1.9)
Lactic Acid, Venous: 2.2 mmol/L (ref 0.5–1.9)

## 2021-05-22 LAB — SURGICAL PATHOLOGY

## 2021-05-22 LAB — PHOSPHORUS: Phosphorus: 7.8 mg/dL — ABNORMAL HIGH (ref 2.5–4.6)

## 2021-05-22 LAB — HIV ANTIBODY (ROUTINE TESTING W REFLEX): HIV Screen 4th Generation wRfx: NONREACTIVE

## 2021-05-22 LAB — CK: Total CK: 5609 U/L — ABNORMAL HIGH (ref 38–234)

## 2021-05-22 LAB — HEMOGLOBIN A1C
Hgb A1c MFr Bld: 5.8 % — ABNORMAL HIGH (ref 4.8–5.6)
Mean Plasma Glucose: 120 mg/dL

## 2021-05-22 MED ORDER — DEXAMETHASONE SODIUM PHOSPHATE 10 MG/ML IJ SOLN
INTRAMUSCULAR | Status: DC | PRN
Start: 2021-05-22 — End: 2021-05-22
  Administered 2021-05-22: 10 mg via INTRAVENOUS

## 2021-05-22 MED ORDER — SODIUM BICARBONATE 8.4 % IV SOLN
50.0000 meq | Freq: Once | INTRAVENOUS | Status: AC
  Administered 2021-05-22: 50 meq via INTRAVENOUS
  Filled 2021-05-22: qty 50

## 2021-05-22 MED ORDER — ALBUTEROL SULFATE (2.5 MG/3ML) 0.083% IN NEBU: 2.5000 mg | INHALATION_SOLUTION | RESPIRATORY_TRACT | Status: DC | PRN

## 2021-05-22 MED ORDER — POLYETHYLENE GLYCOL 3350 17 G PO PACK
17.0000 g | PACK | Freq: Every day | ORAL | Status: DC | PRN
Start: 2021-05-22 — End: 2021-06-08

## 2021-05-22 MED ORDER — GERHARDT'S BUTT CREAM
TOPICAL_CREAM | Freq: Four times a day (QID) | CUTANEOUS | Status: DC
  Administered 2021-05-23 – 2021-05-29 (×12): 1 via TOPICAL
  Filled 2021-05-22 (×5): qty 1

## 2021-05-22 MED ORDER — SODIUM CHLORIDE 0.9 % IV SOLN
2.0000 g | INTRAVENOUS | Status: DC
  Administered 2021-05-22: 2 g via INTRAVENOUS
  Filled 2021-05-22 (×2): qty 2

## 2021-05-22 MED ORDER — FENTANYL 2500MCG IN NS 250ML (10MCG/ML) PREMIX INFUSION
50.0000 ug/h | INTRAVENOUS | Status: DC
  Administered 2021-05-22: 50 ug/h via INTRAVENOUS
  Filled 2021-05-22: qty 250

## 2021-05-22 MED ORDER — MIDAZOLAM HCL 2 MG/2ML IJ SOLN
INTRAMUSCULAR | Status: AC
  Filled 2021-05-22: qty 2

## 2021-05-22 MED ORDER — DOCUSATE SODIUM 50 MG/5ML PO LIQD
100.0000 mg | Freq: Two times a day (BID) | ORAL | Status: DC
  Administered 2021-05-22: 100 mg
  Filled 2021-05-22: qty 10

## 2021-05-22 MED ORDER — VANCOMYCIN HCL IN DEXTROSE 1-5 GM/200ML-% IV SOLN: 1000.0000 mg | INTRAVENOUS | Status: DC

## 2021-05-22 MED ORDER — MIDAZOLAM HCL 2 MG/2ML IJ SOLN: 2.0000 mg | INTRAMUSCULAR | Status: DC | PRN

## 2021-05-22 MED ORDER — PROPOFOL 10 MG/ML IV BOLUS
INTRAVENOUS | Status: DC | PRN
  Administered 2021-05-22: 120 mg via INTRAVENOUS

## 2021-05-22 MED ORDER — PANTOPRAZOLE SODIUM 40 MG IV SOLR
40.0000 mg | Freq: Every day | INTRAVENOUS | Status: DC
  Administered 2021-05-22 – 2021-05-30 (×10): 40 mg via INTRAVENOUS
  Filled 2021-05-22 (×10): qty 40

## 2021-05-22 MED ORDER — MIDAZOLAM HCL 2 MG/2ML IJ SOLN
INTRAMUSCULAR | Status: DC | PRN
  Administered 2021-05-22 (×2): 1 mg via INTRAVENOUS

## 2021-05-22 MED ORDER — LIDOCAINE HCL (CARDIAC) PF 100 MG/5ML IV SOSY
PREFILLED_SYRINGE | INTRAVENOUS | Status: DC | PRN
  Administered 2021-05-22: 60 mg via INTRAVENOUS

## 2021-05-22 MED ORDER — ONDANSETRON HCL 4 MG/2ML IJ SOLN
INTRAMUSCULAR | Status: DC | PRN
  Administered 2021-05-22: 4 mg via INTRAVENOUS

## 2021-05-22 MED ORDER — FENTANYL BOLUS VIA INFUSION
50.0000 ug | INTRAVENOUS | Status: DC | PRN
  Administered 2021-05-22: 50 ug via INTRAVENOUS
  Filled 2021-05-22: qty 100

## 2021-05-22 MED ORDER — SODIUM CHLORIDE 0.9 % IV SOLN
INTRAVENOUS | Status: DC | PRN
  Administered 2021-05-22: 50 ug/min via INTRAVENOUS

## 2021-05-22 MED ORDER — FENTANYL CITRATE PF 50 MCG/ML IJ SOSY: 50.0000 ug | PREFILLED_SYRINGE | Freq: Once | INTRAMUSCULAR | Status: AC

## 2021-05-22 MED ORDER — SODIUM CHLORIDE 0.9 % IV SOLN: INTRAVENOUS | Status: DC | PRN

## 2021-05-22 MED ORDER — FENTANYL CITRATE (PF) 100 MCG/2ML IJ SOLN
INTRAMUSCULAR | Status: DC | PRN
  Administered 2021-05-22 (×2): 50 ug via INTRAVENOUS

## 2021-05-22 MED ORDER — ROCURONIUM BROMIDE 100 MG/10ML IV SOLN
INTRAVENOUS | Status: DC | PRN
  Administered 2021-05-22: 70 mg via INTRAVENOUS
  Administered 2021-05-22: 30 mg via INTRAVENOUS

## 2021-05-22 MED ORDER — POLYETHYLENE GLYCOL 3350 17 G PO PACK: 17.0000 g | PACK | Freq: Every day | ORAL | Status: DC

## 2021-05-22 MED ORDER — DEXTROSE 50 % IV SOLN
12.5000 g | Freq: Once | INTRAVENOUS | Status: AC
  Administered 2021-05-22: 12.5 g via INTRAVENOUS
  Filled 2021-05-22: qty 50

## 2021-05-22 MED ORDER — DOCUSATE SODIUM 100 MG PO CAPS: 100.0000 mg | ORAL_CAPSULE | Freq: Two times a day (BID) | ORAL | Status: DC | PRN

## 2021-05-22 MED ORDER — NOREPINEPHRINE 4 MG/250ML-% IV SOLN: 0.0000 ug/min | INTRAVENOUS | Status: DC

## 2021-05-22 MED ORDER — SODIUM CHLORIDE 0.9% FLUSH
10.0000 mL | Freq: Two times a day (BID) | INTRAVENOUS | Status: DC
  Administered 2021-05-22 – 2021-06-08 (×33): 10 mL

## 2021-05-22 MED ORDER — PHENYLEPHRINE HCL (PRESSORS) 10 MG/ML IV SOLN
INTRAVENOUS | Status: DC | PRN
  Administered 2021-05-22 (×6): 200 ug via INTRAVENOUS

## 2021-05-22 MED ORDER — LACTATED RINGERS IV SOLN: INTRAVENOUS | Status: DC

## 2021-05-22 MED ORDER — CHLORHEXIDINE GLUCONATE CLOTH 2 % EX PADS
6.0000 | MEDICATED_PAD | Freq: Every day | CUTANEOUS | Status: DC
  Administered 2021-05-23: 6 via TOPICAL

## 2021-05-22 MED ORDER — SODIUM CHLORIDE 0.9% FLUSH
10.0000 mL | INTRAVENOUS | Status: DC | PRN
  Administered 2021-06-04: 10 mL

## 2021-05-22 MED ORDER — LACTATED RINGERS IV BOLUS
1000.0000 mL | Freq: Once | INTRAVENOUS | Status: AC
  Administered 2021-05-22: 1000 mL via INTRAVENOUS

## 2021-05-22 MED ORDER — LABETALOL HCL 5 MG/ML IV SOLN
INTRAVENOUS | Status: DC | PRN
  Administered 2021-05-22: 5 mg via INTRAVENOUS

## 2021-05-22 MED ORDER — PROPOFOL 1000 MG/100ML IV EMUL
0.0000 ug/kg/min | INTRAVENOUS | Status: DC
  Administered 2021-05-22: 5 ug/kg/min via INTRAVENOUS
  Filled 2021-05-22: qty 100

## 2021-05-22 MED ORDER — FENTANYL CITRATE (PF) 100 MCG/2ML IJ SOLN
INTRAMUSCULAR | Status: AC
  Filled 2021-05-22: qty 2

## 2021-05-22 MED ORDER — STERILE WATER FOR INJECTION IV SOLN
INTRAVENOUS | Status: AC
  Filled 2021-05-22: qty 150

## 2021-05-22 NOTE — Progress Notes (Signed)
PHARMACY CONSULT NOTE - FOLLOW UP  Pharmacy Consult for Electrolyte Monitoring and Replacement   Recent Labs: Potassium (mmol/L)  Date Value  05/21/2021 4.8   Calcium (mg/dL)  Date Value  05/21/2021 8.8 (L)   Albumin (g/dL)  Date Value  05/21/2021 3.6   Sodium (mmol/L)  Date Value  05/21/2021 137     Assessment: 10/10:  Ca = 8.8 , Abl = 3.6,  Corrected Ca = 9.12  Goal of Therapy:  Electrolytes WNL   Plan:  No additional electrolytes supplementation needed at this time.  Will recheck electrolytes on 10/11 with AM labs.   Orene Desanctis ,PharmD Clinical Pharmacist 05/22/2021 2:05 AM

## 2021-05-22 NOTE — Final Consult Note (Signed)
[place] [place] [facility] Nurse Consult Note: Patient receiving care in [facility] ICU 3. Primary RN, Myra, present at time of my assessment Reason for Consult: PIs to neck, buttocks, coccyx Wound type: The only truly identifiable PI is a DTPI to the right ischial tuberosity. It is dark purple and approximately 4 cm x 5 cm and is highly likely to develop into an unstageable PI. Pressure Injury POA: Yes Measurement: Wound bed: Drainage (amount, consistency, odor)  Periwound: Dressing procedure/placement/frequency: The posterior neck, the flank areas of the body, and the legs have extensive discoloration, some of which seems consistent with moisture damage and abrasion.  Almost the entire body is covered with insect bites.  According to the note from Olga Millers, NP on 05/22/21 at 3:30 a.m.   The family reported they found her sitting on a toilet, slumped with her neck up against a basket. And, the patient arrived infested with bed bugs and roaches.  Monitor the wound area(s) for worsening of condition such as: Signs/symptoms of infection,  Increase in size,  Development of or worsening of odor, Development of pain, or increased pain at the affected locations.  Notify the medical team if any of these develop.  Thank you for the consult.  Discussed plan of care with the bedside nurse.  [place] nurse will not follow at this time.  Please re-consult the [place] team if needed.  Val Riles, RN, MSN, CWOCN, CNS-BC, pager [phone]

## 2021-05-22 NOTE — Plan of Care (Signed)
Pt's EVD adjusted to -10cm H20 measured at Tragus per orders from Dr. Izora Ribas. Additionally not monitoring ICP's per provider order.

## 2021-05-22 NOTE — Procedures (Signed)
Extubation Procedure Note  Patient Details:   Name: Desiree Mitchell DOB: 13-Nov-1955 MRN: [id number]   Airway Documentation:    Vent end date: 05/22/21 Vent end time: 1137   Evaluation  O2 sats: stable throughout Complications: No apparent complications Patient did tolerate procedure well. Bilateral Breath Sounds:  Clear/Diminished.   Yes.  Extubated to room air without incident.  Desiree Mitchell 05/22/2021, 11:38 AM

## 2021-05-22 NOTE — Anesthesia Procedure Notes (Signed)
Procedure Name: Intubation Date/Time: 05/22/2021 12:39 AM Performed by: Tollie Eth, CRNA Pre-anesthesia Checklist: Patient identified, Patient being monitored, Timeout performed, Emergency Drugs available and Suction available Patient Re-evaluated:Patient Re-evaluated prior to induction Oxygen Delivery Method: Circle system utilized Preoxygenation: Pre-oxygenation with 100% oxygen Induction Type: IV induction Ventilation: Mask ventilation without difficulty Laryngoscope Size: 3 and McGraph Grade View: Grade III Tube type: Oral Tube size: 7.5 mm Number of attempts: 1 Airway Equipment and Method: Stylet and Video-laryngoscopy Placement Confirmation: ETT inserted through vocal cords under direct vision, positive ETCO2 and breath sounds checked- equal and bilateral Secured at: 21 cm Tube secured with: Tape Dental Injury: Teeth and Oropharynx as per pre-operative assessment

## 2021-05-22 NOTE — H&P (Addendum)
NAME:  Desiree Mitchell, MRN:  [id number], DOB:  12-02-1955, LOS: 0 ADMISSION DATE:  05/21/2021, CONSULTATION DATE: 05/22/2021 REFERRING MD: Dr. Derenda Fennel. Desiree Mitchell, CHIEF COMPLAINT: Altered mental status  History of Present Illness:  65 year old female presented at [facility] ED on 05/21/2021 from home via EMS after being found unresponsive by family.  Patient was last seen normal on Saturday, 05/19/2021 at 4 PM.  On EMS arrival patient was unresponsive and hypoxic with SPO2 in the 70s and a normal blood sugar, EMS had difficulty getting a blood pressure.  An IO was placed in the right humeral head and the patient received 300 mL of LR, 40 mg of lidocaine and 25 mg of Phenergan.  Family later reported to EDP that they found her sitting on a toilet slumped with her neck up against a basket. Of note patient arrived infested with bed bugs and roaches. ED course: Per EDP handoff and documentation patient was able to move the right side of her body but not the left side.  She opened her eyes to voice and was able to mumble her name.  CT scans were concerning for potential shunt malfunction and Dr. Cari Mitchell with neurosurgery was consulted.  Dr. Cari Mitchell is coming in to take the patient urgently to the OR for possible shunt revision.  Patient was worked up for sepsis protocol. medications given: 2 L of NS, cefepime, Flagyl and vancomycin Initial Vitals: Severely hypothermic with temp 91.1, RR 21, NSR at 95, BP 169/123 with SPO2 95% on nonrebreather 15 L Significant labs: (Labs/ Imaging personally reviewed) I, Domingo Pulse [place], AGACNP-BC, personally viewed and interpreted this ECG. Chemistry: Na+: 137, K+: 4.8, Cl: 97, BUN/Cr.:  84/2.81, Serum CO2/ AG: 17/23, AST: 155, ALT: 59, CK: 6755 Hematology: WBC: 30.3, Hgb: 15.9, Troponin: 22, Lactic/ PCT: 2.4 > 2.2/ pending, COVID-19 & Influenza A/B: negative VBG: 7.21/53/32/21.2 CXR 05/21/2021: Mild left basilar atelectasis without evidence of an acute  infiltrate CT head without contrast 05/21/2021: Interval development of mild hydrocephalus and periventricular edema, associated malpositioned left VA shunt that terminates in the anterior mediastinum and no longer within the left brachiocephalic vein.  Shunt noted to be coiled within the neck.  Groundglass opacity within the right lower lobe and patchy airspace opacity within the left lower lobe. CT cervical spine 05/21/2021: No acute fracture or traumatic injury noted.  Interval development of enlarged right thyroid gland.  Patient taken by Dr. Cari Mitchell with neurosurgery emergently to the OR to remove her VA shunt and place an external ventricular drain.  PCCM consulted for admission due to complexity of medical issues and patient currently requiring mechanical ventilatory support post surgery.  Neurosurgery will closely follow and manage EVD.  Pertinent  Medical History  CVA-left sided deficits  Subarachnoid Hemorrhage with subsequent hydrocephalus and shunt placement  Breast cancer (with shunt revised to VA shunt) Asthma Hypertension  Significant Hospital Events: Including procedures, antibiotic start and stop dates in addition to other pertinent events   05/22/2021: Patient admitted to ICU requiring mechanical ventilatory support status post VA shunt removal and EVD placement in the setting of rhabdomyolysis and sepsis  Interim History / Subjective:  Patient sedated and paralyzed per OR staff postop on mechanical ventilation  Objective   Blood pressure (!) 164/145, pulse (!) 105, temperature (!) 96.2 F (35.7 C), resp. rate 20, height 5\' 2"  (1.575 m), weight 99.8 kg, SpO2 98 %.        Intake/Output Summary (Last 24 hours) at 05/22/2021 0251 Last data filed at 05/22/2021 [phone]  Gross per 24 hour  Intake 2900.63 ml  Output 10 ml  Net 2890.63 ml   Filed Weights   05/21/21 1913  Weight: 99.8 kg    Examination: General: Adult female, critically ill, lying in bed intubated &  sedated requiring mechanical ventilation  NAD HEENT: MM pink/moist, anicteric, atraumatic, neck supple Neuro: Sedated and paralyzed in OR, unable to follow commands, PERRL +2, no cough/gag/corneal reflex CV: s1s2 RRR, NSR on monitor, no r/m/g Pulm: Regular, non labored on PRVC at 60% FiO2 with PEEP of 5, breath sounds clear-BUL & clear/diminished-BLL GI: soft, rounded, bs x 4 GU: foley in place with cloudy amber urine Skin: PTA pressure injury posterior neck, scattered bug bites and excoriation on extremities > Limited exam due to recent neurosurgery Extremities: warm/dry, pulses + 2 R/P, trace edema noted BLE  Resolved Hospital Problem list     Assessment & Plan:  Acute Respiratory Failure in the postoperative setting after neurosurgical VA shunt removal with EVD placement  - Ventilator settings: PRVC  8 mL/kg, 60% FiO2, 5 PEEP, continue ventilator support & lung protective strategies - Wean PEEP & FiO2 as tolerated, maintain SpO2 > 90% - Head of bed elevated 30 degrees, VAP protocol in place - Plateau pressures less than 30 cm H20  - Intermittent chest x-ray & ABG PRN - Daily WUA with SBT as tolerated  - Ensure adequate pulmonary hygiene  - bronchodilators PRN - PAD protocol in place: continue Fentanyl drip & Propofol drip  Rhabdomyolysis secondary to hypovolemia in the setting of dehydration Increased Anion Gap Metabolic Acidosis Acute Kidney Injury  Baseline Cr: 0.9, Cr on admission: 2.81, CK: 6,755 >> 5,609 - Strict I/O's: alert provider if UOP < 0.5 mL/kg/hr - IVF hydration: 3rd L of LR ordered >  followed by continuous IVF, initiate sodium bicarbonate drip to help treat severe rhabdomyolysis & metabolic acidosis (discussed with Dr. Cari Mitchell who approved of administration at a moderate rate) - Daily BMP, replace electrolytes PRN - Avoid nephrotoxic agents as able, ensure adequate renal perfusion - consider renal US  Sepsis without septic shock due to unknown source in the  setting of malfunctioned VA shunt - Supplemental oxygen as needed, to maintain SpO2 > 90% - f/u cultures, trend lactic/ PCT - Daily CBC - monitor WBC/ fever curve - IV antibiotics: cefepime & vancomycin   - IVF hydration as above - Consider vasopressors to maintain MAP< 65, norepinephrine first choice - Strict I/O's: alert provider if UOP < 0.5 mL/kg/hr  VA Shunt Malfunction Acute encephalopathy Patient returned postop with EVD in place.   - Dr. Cari Mitchell went through EVD orders with nursing bedside: monitor drain output every 2 hours. Call if non-functional. Set ventriculostomy height to 10 cm above the tragus -Frequent neuro checks -Management by neurosurgery appreciate input  Transaminitis secondary to hypovolemia in the setting of rhabdomyolysis - Trend hepatic function - Consider RUQ [place], if no improvement post IV fluid resuscitation - avoid hepatotoxic agents  Posterior Neck, Buttocks & Coccyx Pressure Injuries secondary to being found down at home for unknown amount of time - Q 2 h turns and repositioning as EVD allows - ROM Q 4 h - WOC consult  Best Practice (right click and "Reselect all SmartList Selections" daily)  Diet/type: NPO w/ meds via tube DVT prophylaxis: SCD GI prophylaxis: PPI Lines: Central line Foley:  Yes, and it is still needed Code Status:  full code Last date of multidisciplinary goals of care discussion [05/22/21]  Labs   CBC: Recent Labs  Lab 05/21/21 1905  WBC 30.3*  NEUTROABS 27.1*  HGB 15.9*  HCT 49.0*  MCV 96.3  PLT 491*    Basic Metabolic Panel: Recent Labs  Lab 05/21/21 1905  NA 137  K 4.8  CL 97*  CO2 17*  GLUCOSE 129*  BUN 84*  CREATININE 2.81*  CALCIUM 8.8*   GFR: Estimated Creatinine Clearance: 22.6 mL/min (A) (by C-G formula based on SCr of 2.81 mg/dL (H)). Recent Labs  Lab 05/21/21 1905 05/21/21 2146 05/21/21 2344  WBC 30.3*  --   --   LATICACIDVEN  --  2.4* 2.2*    Liver Function Tests: Recent Labs   Lab 05/21/21 1905  AST 155*  ALT 59*  ALKPHOS 76  BILITOT 1.1  PROT 7.6  ALBUMIN 3.6   No results for input(s): LIPASE, AMYLASE in the last 168 hours. Recent Labs  Lab 05/21/21 1905  AMMONIA 29    ABG    Component Value Date/Time   HCO3 21.2 05/21/2021 1909   ACIDBASEDEF 7.3 (H) 05/21/2021 1909   O2SAT 46.7 05/21/2021 1909     Coagulation Profile: No results for input(s): INR, PROTIME in the last 168 hours.  Cardiac Enzymes: Recent Labs  Lab 05/21/21 1905  CKTOTAL 6,755*    HbA1C: No results found for: HGBA1C  CBG: No results for input(s): GLUCAP in the last 168 hours.  Review of Systems:   UTA- patient intubated and sedated  Past Medical History:  She,  has no past medical history on file.   Surgical History:  History reviewed. No pertinent surgical history.   Social History:      Family History:  Her family history is not on file.   Allergies No Known Allergies   Home Medications  Prior to Admission medications   Not on File     Critical care time: 65 minutes       Venetia Night, AGACNP-BC Acute Care Nurse Practitioner [place]   [phone] / [phone] Please see Amion for pager details.

## 2021-05-22 NOTE — Progress Notes (Signed)
eLink Physician-Brief Progress Note Patient Name: Desiree Mitchell DOB: [birth year] MRN: [id number]   Date of Service  05/22/2021  HPI/Events of Note  65 yr old female with hx of shunt placed more than a decade ago following SAH presents with altered mental status, leukocytosis, and renal failure.  Imaging of head revealed enlarged ventricle.  Patient taken to Or where shunt was removed and EVD placed.  She is now in the ICU intubated.  Empiric abx have been ordered.  She received 2 liters of crystalloid earlier this evening.  She is being admitted by critical care service.  eICU Interventions  Chart reviewed     Intervention Category Evaluation Type: New Patient Evaluation  Mauri Brooklyn, P 05/22/2021, 3:26 AM

## 2021-05-22 NOTE — Progress Notes (Addendum)
    Attending Progress Note  History: Annete Ayuso presenting on 05/21/21 with after being found unresponsive by family. She has a history of prior subarachnoid hemorrhage requiring shunt placement. Resulting in neurosurgery consult. CT head showed increased ventricular size concerning for shunt malfunction.  She underwent removal of shunt and placement of EVD on 05/21/21.   POD#1: Currently off all sedation  Physical Exam: Vitals:   05/22/21 0800 05/22/21 0818  BP: (!) 140/93   Pulse: 95   Resp: 19   Temp: (!) 97 F (36.1 C)   SpO2: 99% 95%    Intubated.  PERRL Opens eyes to painful stimuli. Localizes to pain on the bilaterally more briskly on left than right.  Incision sites c/d/I.   Data:  Recent Labs  Lab 05/21/21 1905 05/22/21 0342  NA 137 140  K 4.8 3.8  CL 97* 109  CO2 17* 18*  BUN 84* 88*  CREATININE 2.81* 2.60*  GLUCOSE 129* 93  CALCIUM 8.8* 7.3*   Recent Labs  Lab 05/21/21 1905  AST 155*  ALT 59*  ALKPHOS 76     Recent Labs  Lab 05/21/21 1905 05/22/21 0342  WBC 30.3* 21.9*  HGB 15.9* 13.4  HCT 49.0* 38.9  PLT 491* 353   No results for input(s): APTT, INR in the last 168 hours.       Other tests/results:  CT head  IMPRESSION: 1. Interval development of mild hydrocephalus and periventricular edema. 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 The shunt is noted to be coiled within the neck. Recommend neurosurgery consultation. 2. No acute displaced fracture or traumatic listhesis of the cervical spine. 3. Ground-glass airspace opacity within the right lower lobe and patchy airspace opacity within left lower lobe. Findings may represent a combination of infection/inflammation as well as atelectasis. 4. No acute traumatic injury to the chest, abdomen, or pelvis with limited evaluation on this noncontrast  study.   5. No acute fracture or traumatic malalignment of the thoracic or lumbar spine. 6.  Aortic Atherosclerosis (ICD10-I70.0). 7. Interval development of an enlarged right thyroid gland with limited evaluation due to streak artifact. Recommend thyroid ultrasound (ref: J Am Coll Radiol. 2015 Feb;12(2): 143-50).   These results were called by telephone at the time of interpretation on 05/21/2021 at 9:48 pm to provider [facility] , who verbally acknowledged these results.   Electronically Signed   By: Iven Finn M.D.   On: 05/21/2021 21:54    Assessment/Plan:  Jerika Wales is a 65 y.o presenting to the ER after being found down by her family on 05/21/21. She was found to be septic and head CT was concerning for shunt malfunction.  She underwent removal of VA shunt on 05/21/2021 and placement of EVD.  - EVD in place at a pop off of 10.  - cultures pending  - remainder of management per recommendations of critical care.  Cooper Render  Department of Neurosurgery

## 2021-05-22 NOTE — Anesthesia Postprocedure Evaluation (Signed)
Anesthesia Post Note  Patient: Desiree Mitchell  Procedure(s) Performed: SHUNT REMOVAL (Left) VENTRICULOSTOMY (Left)  Patient location during evaluation: ICU Anesthesia Type: General Level of consciousness: sedated Vital Signs Assessment: post-procedure vital signs reviewed and stable Respiratory status: patient on ventilator - see flowsheet for VS Anesthetic complications: no   No notable events documented.   Last Vitals:  Vitals:   05/22/21 0600 05/22/21 0630  BP: (!) 130/56   Pulse: 79 77  Resp: 20 20  Temp: 36.6 C 36.5 C  SpO2: 98% 100%    Last Pain:  Vitals:   05/21/21 2134  TempSrc: Bladder  PainSc:                  Lerry Liner

## 2021-05-22 NOTE — Progress Notes (Signed)
PHARMACY - PHYSICIAN COMMUNICATION CRITICAL VALUE ALERT - BLOOD CULTURE IDENTIFICATION (BCID)  Desiree Mitchell is an 65 y.o. female who presented to [facility] on 05/21/2021 with a chief complaint of altered mental status/sepsis w/ malfunctioning VA shunt   Assessment:  1/4 (anaerobic) MSSE, unkown source, potential contaminant given only 1/4. Patient with malpositioned ventriculoatrial shunt (hydrocephalus) present on admission requiring removal 10/11 and placement of left parietal approach ventriculostomy. Patient also with posterior pressure injuries and Acute resp failure with chest CT c/f: "Ground-glass airspace opacity within the right lower lobe and patchy airspace opacity within left lower lobe"  Name of physician (or Provider) Contacted: Jonny Ruiz, NP  Current antibiotics: cefepime + Vancomycin (Day 2)   Changes to prescribed antibiotics recommended:  Patient is on recommended antibiotics - No changes needed Given multiple potential sources of infection, continuing broad spectrum abx for now until more cultures result and repeating blood cultures   Results for orders placed or performed during the hospital encounter of 05/21/21  Blood Culture ID Panel (Reflexed) (Collected: 05/21/2021  7:05 PM)  Result Value Ref Range   Enterococcus faecalis NOT DETECTED NOT DETECTED   Enterococcus Faecium NOT DETECTED NOT DETECTED   Listeria monocytogenes NOT DETECTED NOT DETECTED   Staphylococcus species DETECTED (A) NOT DETECTED   Staphylococcus aureus (BCID) NOT DETECTED NOT DETECTED   Staphylococcus epidermidis DETECTED (A) NOT DETECTED   Staphylococcus lugdunensis NOT DETECTED NOT DETECTED   Streptococcus species NOT DETECTED NOT DETECTED   Streptococcus agalactiae NOT DETECTED NOT DETECTED   Streptococcus pneumoniae NOT DETECTED NOT DETECTED   Streptococcus pyogenes NOT DETECTED NOT DETECTED   A.calcoaceticus-baumannii NOT DETECTED NOT DETECTED   Bacteroides fragilis NOT DETECTED NOT  DETECTED   Enterobacterales NOT DETECTED NOT DETECTED   Enterobacter cloacae complex NOT DETECTED NOT DETECTED   Escherichia coli NOT DETECTED NOT DETECTED   Klebsiella aerogenes NOT DETECTED NOT DETECTED   Klebsiella oxytoca NOT DETECTED NOT DETECTED   Klebsiella pneumoniae NOT DETECTED NOT DETECTED   Proteus species NOT DETECTED NOT DETECTED   Salmonella species NOT DETECTED NOT DETECTED   Serratia marcescens NOT DETECTED NOT DETECTED   Haemophilus influenzae NOT DETECTED NOT DETECTED   Neisseria meningitidis NOT DETECTED NOT DETECTED   Pseudomonas aeruginosa NOT DETECTED NOT DETECTED   Stenotrophomonas maltophilia NOT DETECTED NOT DETECTED   Candida albicans NOT DETECTED NOT DETECTED   Candida auris NOT DETECTED NOT DETECTED   Candida glabrata NOT DETECTED NOT DETECTED   Candida krusei NOT DETECTED NOT DETECTED   Candida parapsilosis NOT DETECTED NOT DETECTED   Candida tropicalis NOT DETECTED NOT DETECTED   Cryptococcus neoformans/gattii NOT DETECTED NOT DETECTED   Methicillin resistance mecA/C NOT DETECTED NOT DETECTED    Dorothe Pea, PharmD, BCPS Clinical Pharmacist   Dorothe Pea 05/22/2021  9:49 PM

## 2021-05-22 NOTE — Op Note (Signed)
Indications: The patient is a 65yo female who presented with concern for shunt malfunction as well as signs of sepsis.  She had increased ventricular size on a CT, as well as altered mental status.  Due to concern for shunt malfunction, removal of shunt and placement of EVD was recommended.  Findings: good flowing ventriculostomy; significant pressure sore present on the back of neck that was present on admission  Preoperative Diagnosis: shunt malfunction Postoperative Diagnosis: same   EBL: 10 ml IVF: see AR ml Drains: ventricular drain placed Disposition: Intubated and critically ill to ICU Complications: none   Preoperative Note:   Risks of surgery discussed include: infection, bleeding, stroke, coma, death, paralysis, CSF leak, nerve/spinal cord injury, numbness, tingling, weakness, complex regional pain syndrome, recurrent stenosis and/or disc herniation, vascular injury, development of instability, neck/back pain, need for further surgery, persistent symptoms, development of deformity, and the risks of anesthesia. The patient's family understood these risks and agreed to proceed.  NAME OF PROCEDURE:               1. Removal of complete ventriculoatrial shunt 2. Placement of left parietal approach ventriculostomy  PROCEDURE:  Patient was brought to the operating room, then intubated. She was positioned on a gel roll and on  the horseshoe.  Her hair was clipped.  A significant pressure sore was noted from her period of nonresponsiveness outside of the hospital.  This was documented.  The operative site was prepped and draped.  The neck incision was opened and the tubing found.  The A connector was identified, then disconnected.  The atrial tubing was removed and noted to have some CSF flow, but no blood on the end of the catheter. CSF was collected for culture.  The cranial incision was then opened.  The proximal catheter and valve were identified, then removed.  The ventriculostomy  was then passed along the same tract to 11 cm at the skull.  Good flow of CSF was noted.  The catheter was tunneled 5 cm away and secured at the skin.  The connector was secured.  We then closed the neck incision and the cranial incision with 2-0 vicryl.  Staples were used on the skin of the cranial incision.  The neck incision was sealed with dermabond.  The ventriculostomy was secured with 2-0 silk, then connected to the collection chamber.  Good flow was noted.  The dressings were placed and the drapes then removed.  Needle, lap and all counts were correct at the end of the case.    The patient remained intubated for transfer to ICU.  Meade Maw MD Neurosurgery

## 2021-05-22 NOTE — Progress Notes (Signed)
Pharmacy Antibiotic Note  Desiree Mitchell is a 65 y.o. female admitted on 05/21/2021 with sepsis.  Pharmacy has been consulted for Vancomycin, Cefepime dosing.  Plan: Vancomycin 2 gm IV X 1 given on 10/10 @ 2340. Vancomycin 1 gm IV Q48H ordered to start on 10/13 @ 0000.  AUC = 561.3 Vanc trough = 15   Cefepime 2 gm IV X 1 given on 10/10 @ 2132. Cefepime 2 gm IV Q24H ordered to continue on 10/11 @ 2100.   Height: 5\' 2"  (157.5 cm) Weight: 99.8 kg (220 lb) IBW/kg (Calculated) : 50.1  Temp (24hrs), Avg:95 F (35 C), Min:93.6 F (34.2 C), Max:96.2 F (35.7 C)  Recent Labs  Lab 05/21/21 1905 05/21/21 2146 05/21/21 2344  WBC 30.3*  --   --   CREATININE 2.81*  --   --   LATICACIDVEN  --  2.4* 2.2*    Estimated Creatinine Clearance: 22.6 mL/min (A) (by C-G formula based on SCr of 2.81 mg/dL (H)).    No Known Allergies  Antimicrobials this admission:   >>    >>   Dose adjustments this admission:   Microbiology results:  BCx:   UCx:    Sputum:    MRSA PCR:   Thank you for allowing pharmacy to be a part of this patient's care.  Michel Hendon D 05/22/2021 3:06 AM

## 2021-05-22 NOTE — Transfer of Care (Signed)
Immediate Anesthesia Transfer of Care Note  Patient: Desiree Mitchell  Procedure(s) Performed: SHUNT REMOVAL (Left) VENTRICULOSTOMY (Left)  Patient Location: ICU  Anesthesia Type:General  Level of Consciousness: sedated  Airway & Oxygen Therapy: Patient remains intubated per anesthesia plan  Post-op Assessment: Report given to RN and Post -op Vital signs reviewed and stable  Post vital signs: Reviewed and stable  Last Vitals:  Vitals Value Taken Time  BP 141/79 05/22/21 0218  Temp    Pulse 79 05/22/21 0224  Resp 20 05/22/21 0224  SpO2 98 % 05/22/21 0224  Vitals shown include unvalidated device data.  Last Pain:  Vitals:   05/21/21 2134  TempSrc: Bladder  PainSc:          Complications: No notable events documented.

## 2021-05-22 NOTE — Progress Notes (Signed)
PHARMACY CONSULT NOTE - FOLLOW UP  Pharmacy Consult for Electrolyte Monitoring and Replacement   Recent Labs: Potassium (mmol/L)  Date Value  05/22/2021 3.8   Magnesium (mg/dL)  Date Value  05/22/2021 2.4   Calcium (mg/dL)  Date Value  05/22/2021 7.3 (L)   Albumin (g/dL)  Date Value  05/21/2021 3.6   Phosphorus (mg/dL)  Date Value  05/22/2021 7.8 (H)   Sodium (mmol/L)  Date Value  05/22/2021 140     Assessment: 65yo Female with h/o SAH req'ing shunt >10y ago presents with AMS, leukocytosis, and renal failure. Pt went to OR to have shunt removed & EVD was placed. Pt transferred to ICU and intubated started on empiric abx. Pharmacy consulted for electrolyte mgmt.  Ca = 8.8 , Alb = 3.6,  Corrected Ca = 9.12 K: 4.8>3.8; Phos: 7.8; Mg: 2.4 Scr: 2.81>2.6  Goal of Therapy:  Electrolytes WNL   Plan:  Lytes currently WNL not requiring repletion at this time.  On MIVF LR @75ml /hr & NaHCO3 @75ml /hr currently. [I/O: net +3.1L] Will CTM trend with AM labs and replace PRN.  Lorna Dibble ,PharmD Clinical Pharmacist 05/22/2021 12:10 PM

## 2021-05-23 ENCOUNTER — Inpatient Hospital Stay: Payer: Medicare (Managed Care)

## 2021-05-23 DIAGNOSIS — I6389 Other cerebral infarction: Secondary | ICD-10-CM | POA: Diagnosis not present

## 2021-05-23 DIAGNOSIS — G9340 Encephalopathy, unspecified: Secondary | ICD-10-CM | POA: Diagnosis not present

## 2021-05-23 DIAGNOSIS — J9601 Acute respiratory failure with hypoxia: Secondary | ICD-10-CM | POA: Diagnosis not present

## 2021-05-23 DIAGNOSIS — T85618A Breakdown (mechanical) of other specified internal prosthetic devices, implants and grafts, initial encounter: Secondary | ICD-10-CM | POA: Diagnosis not present

## 2021-05-23 LAB — BLOOD CULTURE ID PANEL (REFLEXED) - BCID2
A.calcoaceticus-baumannii: NOT DETECTED
Bacteroides fragilis: NOT DETECTED
CTX-M ESBL: NOT DETECTED
Candida albicans: NOT DETECTED
Candida auris: NOT DETECTED
Candida glabrata: NOT DETECTED
Candida krusei: NOT DETECTED
Candida parapsilosis: NOT DETECTED
Candida tropicalis: NOT DETECTED
Carbapenem resist OXA 48 LIKE: NOT DETECTED
Carbapenem resistance IMP: NOT DETECTED
Carbapenem resistance KPC: NOT DETECTED
Carbapenem resistance NDM: NOT DETECTED
Carbapenem resistance VIM: NOT DETECTED
Cryptococcus neoformans/gattii: NOT DETECTED
Enterobacter cloacae complex: NOT DETECTED
Enterobacterales: DETECTED — AB
Enterococcus Faecium: NOT DETECTED
Enterococcus faecalis: NOT DETECTED
Escherichia coli: NOT DETECTED
Haemophilus influenzae: NOT DETECTED
Klebsiella aerogenes: NOT DETECTED
Klebsiella oxytoca: NOT DETECTED
Klebsiella pneumoniae: NOT DETECTED
Listeria monocytogenes: NOT DETECTED
Neisseria meningitidis: NOT DETECTED
Proteus species: DETECTED — AB
Pseudomonas aeruginosa: NOT DETECTED
Salmonella species: NOT DETECTED
Serratia marcescens: NOT DETECTED
Staphylococcus aureus (BCID): NOT DETECTED
Staphylococcus epidermidis: NOT DETECTED
Staphylococcus lugdunensis: NOT DETECTED
Staphylococcus species: DETECTED — AB
Stenotrophomonas maltophilia: NOT DETECTED
Streptococcus agalactiae: NOT DETECTED
Streptococcus pneumoniae: NOT DETECTED
Streptococcus pyogenes: NOT DETECTED
Streptococcus species: NOT DETECTED

## 2021-05-23 LAB — CBC WITH DIFFERENTIAL/PLATELET
Abs Immature Granulocytes: 0.13 10*3/uL — ABNORMAL HIGH (ref 0.00–0.07)
Basophils Absolute: 0 10*3/uL (ref 0.0–0.1)
Basophils Relative: 0 %
Eosinophils Absolute: 0 10*3/uL (ref 0.0–0.5)
Eosinophils Relative: 0 %
HCT: 39.5 % (ref 36.0–46.0)
Hemoglobin: 13.4 g/dL (ref 12.0–15.0)
Immature Granulocytes: 1 %
Lymphocytes Relative: 5 %
Lymphs Abs: 0.9 10*3/uL (ref 0.7–4.0)
MCH: 32.6 pg (ref 26.0–34.0)
MCHC: 33.9 g/dL (ref 30.0–36.0)
MCV: 96.1 fL (ref 80.0–100.0)
Monocytes Absolute: 1.6 10*3/uL — ABNORMAL HIGH (ref 0.1–1.0)
Monocytes Relative: 9 %
Neutro Abs: 16.4 10*3/uL — ABNORMAL HIGH (ref 1.7–7.7)
Neutrophils Relative %: 85 %
Platelets: 401 10*3/uL — ABNORMAL HIGH (ref 150–400)
RBC: 4.11 MIL/uL (ref 3.87–5.11)
RDW: 13.9 % (ref 11.5–15.5)
WBC: 19 10*3/uL — ABNORMAL HIGH (ref 4.0–10.5)
nRBC: 0 % (ref 0.0–0.2)

## 2021-05-23 LAB — GLUCOSE, CAPILLARY
Glucose-Capillary: 91 mg/dL (ref 70–99)
Glucose-Capillary: 97 mg/dL (ref 70–99)
Glucose-Capillary: 100 mg/dL — ABNORMAL HIGH (ref 70–99)
Glucose-Capillary: 101 mg/dL — ABNORMAL HIGH (ref 70–99)
Glucose-Capillary: 105 mg/dL — ABNORMAL HIGH (ref 70–99)
Glucose-Capillary: 102 mg/dL — ABNORMAL HIGH (ref 70–99)

## 2021-05-23 LAB — BASIC METABOLIC PANEL
Anion gap: 11 (ref 5–15)
BUN: 76 mg/dL — ABNORMAL HIGH (ref 8–23)
CO2: 22 mmol/L (ref 22–32)
Calcium: 8.1 mg/dL — ABNORMAL LOW (ref 8.9–10.3)
Chloride: 111 mmol/L (ref 98–111)
Creatinine, Ser: 1.81 mg/dL — ABNORMAL HIGH (ref 0.44–1.00)
GFR, Estimated: 31 mL/min — ABNORMAL LOW (ref >60)
Glucose, Bld: 108 mg/dL — ABNORMAL HIGH (ref 70–99)
Potassium: 4.2 mmol/L (ref 3.5–5.1)
Sodium: 144 mmol/L (ref 135–145)

## 2021-05-23 LAB — MAGNESIUM: Magnesium: 2.8 mg/dL — ABNORMAL HIGH (ref 1.7–2.4)

## 2021-05-23 LAB — PHOSPHORUS: Phosphorus: 3.8 mg/dL (ref 2.5–4.6)

## 2021-05-23 LAB — PROCALCITONIN: Procalcitonin: 1.11 ng/mL

## 2021-05-23 MED ORDER — SODIUM CHLORIDE 0.9 % IV SOLN
2.0000 g | Freq: Two times a day (BID) | INTRAVENOUS | Status: AC
  Administered 2021-05-23 (×2): 2 g via INTRAVENOUS
  Filled 2021-05-23 (×3): qty 2

## 2021-05-23 MED ORDER — ENOXAPARIN SODIUM 40 MG/0.4ML IJ SOSY
40.0000 mg | PREFILLED_SYRINGE | INTRAMUSCULAR | Status: DC
  Administered 2021-05-23 – 2021-05-28 (×6): 40 mg via SUBCUTANEOUS
  Filled 2021-05-23 (×6): qty 0.4

## 2021-05-23 MED ORDER — SODIUM CHLORIDE 0.9 % IV SOLN
2.0000 g | INTRAVENOUS | Status: AC
  Administered 2021-05-24 – 2021-05-31 (×8): 2 g via INTRAVENOUS
  Filled 2021-05-23 (×8): qty 2

## 2021-05-23 NOTE — Progress Notes (Addendum)
    Attending Progress Note  History: Desiree Mitchell presenting on 05/21/21 with after being found unresponsive by family. She has a history of prior subarachnoid hemorrhage requiring shunt placement. Resulting in neurosurgery consult. CT head showed increased ventricular size concerning for shunt malfunction.  She underwent removal of shunt and placement of EVD on 05/21/21.   POD#2: patient extubated yesterday but increased O2 requirement overnight   POD#1: Currently off all sedation  Physical Exam: Vitals:   05/23/21 0530 05/23/21 0600  BP: 128/88 (!) 164/103  Pulse: (!) 111 (!) 109  Resp: 15 15  Temp: 99.7 F (37.6 C) 99.7 F (37.6 C)  SpO2: 94% 97%    On 5L O2  PERRL Opens eyes to painful stimuli. Localizes to pain on RUE and withdrawals on BLE Incision sites c/d/I.  CSF remains clear.  EVD output 56 overnight   Data:  Recent Labs  Lab 05/21/21 1905 05/22/21 0342 05/23/21 0430  NA 137 140 144  K 4.8 3.8 4.2  CL 97* 109 111  CO2 17* 18* 22  BUN 84* 88* 76*  CREATININE 2.81* 2.60* 1.81*  GLUCOSE 129* 93 108*  CALCIUM 8.8* 7.3* 8.1*    Recent Labs  Lab 05/21/21 1905  AST 155*  ALT 59*  ALKPHOS 76      Recent Labs  Lab 05/21/21 1905 05/22/21 0342 05/23/21 0430  WBC 30.3* 21.9* 19.0*  HGB 15.9* 13.4 13.4  HCT 49.0* 38.9 39.5  PLT 491* 353 401*    No results for input(s): APTT, INR in the last 168 hours.       Other tests/results:  CT head  IMPRESSION: 1. Interval development of mild hydrocephalus and periventricular edema. 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 The shunt is noted to be coiled within the neck. Recommend neurosurgery consultation. 2. No acute displaced fracture or traumatic listhesis of the cervical spine. 3. Ground-glass airspace opacity within the right lower lobe and patchy airspace  opacity within left lower lobe. Findings may represent a combination of infection/inflammation as well as atelectasis. 4. No acute traumatic injury to the chest, abdomen, or pelvis with limited evaluation on this noncontrast study.   5. No acute fracture or traumatic malalignment of the thoracic or lumbar spine. 6.  Aortic Atherosclerosis (ICD10-I70.0). 7. Interval development of an enlarged right thyroid gland with limited evaluation due to streak artifact. Recommend thyroid ultrasound (ref: J Am Coll Radiol. 2015 Feb;12(2): 143-50).   These results were called by telephone at the time of interpretation on 05/21/2021 at 9:48 pm to provider [facility] , who verbally acknowledged these results.   Electronically Signed   By: Iven Finn M.D.   On: 05/21/2021 21:54    Assessment/Plan:  Desiree Mitchell is a 65 y.o presenting to the ER after being found down by her family on 05/21/21. She was found to be septic and head CT was concerning for shunt malfunction.  She underwent removal of VA shunt on 05/21/2021 and placement of EVD.  - EVD in place at a pop off of 10. CSF remains clear - continue q1 neuro checks - CSF cultures pending  - remainder of management per recommendations of critical care.  Cooper Render  Department of Neurosurgery

## 2021-05-23 NOTE — Progress Notes (Signed)
Pharmacy Antibiotic Note  Desiree Mitchell is a 65 y.o. female admitted on 05/21/2021 with sepsis.  Pharmacy has been consulted for Vancomycin, Cefepime dosing.  Plan: Vancomycin 2 gm IV X1 (10/10 @ 2340); then: Vancomycin 1 gm IV Q48H ordered to start on (10/13 @ 0000). (est AUC 447.9 (400-550 goal), SCr 1.81; Vd 0.5 (BMI >30)) Measure vancomycin AUC at steady state as indicated  Cefepime 2 gm IV X 1 (10/10 @ 2132); then: Cefepime 2g IV q24h>q12h per current renal fxn.   Height: 5\' 2"  (157.5 cm) Weight: 88.3 kg (194 lb 10.7 oz) IBW/kg (Calculated) : 50.1  Temp (24hrs), Avg:99.5 F (37.5 C), Min:98.2 F (36.8 C), Max:99.9 F (37.7 C)  Recent Labs  Lab 05/21/21 1905 05/21/21 2146 05/21/21 2344 05/22/21 0342 05/23/21 0430  WBC 30.3*  --   --  21.9* 19.0*  CREATININE 2.81*  --   --  2.60* 1.81*  LATICACIDVEN  --  2.4* 2.2* 1.5  --      Estimated Creatinine Clearance: 32.8 mL/min (A) (by C-G formula based on SCr of 1.81 mg/dL (H)).    No Known Allergies  Antimicrobials this admission: Vanc/CFP (10/10>> MTZ x1 (10/10)  Dose adjustments this admission: Renal function recovering; CTM adjust PRN - Cefepime updated 10/12  Microbiology results: 10/10 BCID - proteus spp in 1of4 & staph spp in 2of4 (no resistances detected), BCID 1of4 MSSE 10/10 Bcx: GPCs & GNRs (pending speciation) 10/11 BCx:  10/11: CSF-NGTD   10/10: FLU/COV - negative   Thank you for allowing pharmacy to be a part of this patient's care.  Shanon Brow Chalyn Amescua 05/23/2021 9:28 AM

## 2021-05-23 NOTE — Progress Notes (Signed)
PHARMACY CONSULT NOTE - FOLLOW UP  Pharmacy Consult for Electrolyte Monitoring and Replacement   Recent Labs: Potassium (mmol/L)  Date Value  05/23/2021 4.2   Magnesium (mg/dL)  Date Value  05/23/2021 2.8 (H)   Calcium (mg/dL)  Date Value  05/23/2021 8.1 (L)   Albumin (g/dL)  Date Value  05/21/2021 3.6   Phosphorus (mg/dL)  Date Value  05/23/2021 3.8   Sodium (mmol/L)  Date Value  05/23/2021 144     Assessment: 65yo Female with h/o SAH req'ing shunt >10y ago presents with AMS, leukocytosis, and renal failure. Pt went to OR to have shunt removed & EVD was placed. Pt transferred to ICU and intubated started on empiric abx. Pharmacy consulted for electrolyte mgmt.  K: 3.8>4.2; Phos: 7.8>3.8; Mg: 2.4>2.8 Scr: 2.81>2.6>1.81  Goal of Therapy:  Electrolytes WNL   Plan:  Renal function continues to improve Scr 2.6>1.81; UOP 0.8 ml/k/h Lytes currently WNL not requiring repletion at this time.  On MIVF LR @75ml /hr currently. [I/O: net +2.3L] Will CTM trend with AM labs and replace PRN.  Lorna Dibble ,PharmD Clinical Pharmacist 05/23/2021 9:22 AM

## 2021-05-23 NOTE — TOC Initial Note (Addendum)
Transition of Care [facility]) - Initial/Assessment Note    Patient Details  Name: Desiree Mitchell MRN: [id number] Date of Birth: Jan 04, 1956  [facility] CM/SW Contact:    Ova Freshwater Phone Number: [phone] 05/23/2021, 9:07 AM  Clinical Narrative:                  Patient presents to [facility] due to unresponsiveness. CSW spoke with patient's daughter [name] [phone] [facility]). Ms. Patrick North stated the patient lives at home and is able to manage all ADLs independently except driving.  Ms. Patrick North, and her brother Elouise Munroe, along w/ pt's brother Dionicia Abler provide support for the patient.  CSW stated TOC would continue to follow and would contact Ms. Patrick North once patient was closer to discharge.  CSW gave Ms. Newcomb contact information and encouraged her to contact me if she had any questions or concerns. Ms. Patrick North requested an update from the physician.  CSW stated I would relay the request during rounds.  Expected Discharge Plan: Long Term Acute Care (LTAC) Barriers to Discharge: Continued Medical Work up   Patient Goals and CMS Choice        Expected Discharge Plan and Services Expected Discharge Plan: Long Term Acute Care (LTAC) In-house Referral: Clinical Social Work   Post Acute Care Choice: Long Term Acute Care (LTAC) Living arrangements for the past 2 months: Single Family Home                                      Prior Living Arrangements/Services Living arrangements for the past 2 months: Single Family Home Lives with:: Self Patient language and need for interpreter reviewed:: Yes Do you feel safe going back to the place where you live?: Yes      Need for Family Participation in Patient Care: Yes (Comment) Care giver support system in place?: Yes (comment)   Criminal Activity/Legal Involvement Pertinent to Current Situation/Hospitalization: No - Comment as needed  Activities of Daily Living      Permission  Sought/Granted Permission sought to share information with : Family Supports    Share Information with NAME: [email or web address] (Daughter)   [phone] (Mobile)           Emotional Assessment Appearance:: Appears stated age Attitude/Demeanor/Rapport: Unable to Assess Affect (typically observed): Unable to Assess   Alcohol / Substance Use: Not Applicable Psych Involvement: No (comment)  Admission diagnosis:  Fall [W19.XXXA] Shunt malfunction, initial encounter [T85.618A] Sepsis, due to unspecified organism, unspecified whether acute organ dysfunction present ([place]) [A41.9] Septic shock ([place]) [A41.9, R65.21] Patient Active Problem List   Diagnosis Date Noted   Sepsis ([place]) 05/22/2021   Endotracheally intubated 05/22/2021   On mechanically assisted ventilation ([place]) 05/22/2021   Shunt malfunction 05/22/2021   Rhabdomyolysis 05/22/2021   AKI (acute kidney injury) ([place]) 05/22/2021   Increased anion gap metabolic acidosis 61/95/0932   Acute encephalopathy 05/22/2021   Transaminitis 05/22/2021   Pressure injury of skin 05/22/2021   PCP:  Pcp, No Pharmacy:  No Pharmacies Listed    Social Determinants of Health (SDOH) Interventions    Readmission Risk Interventions No flowsheet data found.

## 2021-05-23 NOTE — Progress Notes (Signed)
PHARMACY - PHYSICIAN COMMUNICATION CRITICAL VALUE ALERT - BLOOD CULTURE IDENTIFICATION (BCID)  Desiree Mitchell is an 65 y.o. female who presented to [facility] on 05/21/2021 with a chief complaint of sepsis.   Assessment:  Proteus species in 1 of 4 bottles (aerobic) and staph species in 2 of 4 bottles (anaerobic and aerobic), no resistance detected.  (include suspected source if known)  Name of physician (or Provider) Contacted: Jonny Ruiz , NP   Current antibiotics: Vanc, cefepime   Changes to prescribed antibiotics recommended:  Patient is on recommended antibiotics - No changes needed  Results for orders placed or performed during the hospital encounter of 05/21/21  Blood Culture ID Panel (Reflexed) (Collected: 05/21/2021  7:05 PM)  Result Value Ref Range   Enterococcus faecalis NOT DETECTED NOT DETECTED   Enterococcus Faecium NOT DETECTED NOT DETECTED   Listeria monocytogenes NOT DETECTED NOT DETECTED   Staphylococcus species DETECTED (A) NOT DETECTED   Staphylococcus aureus (BCID) NOT DETECTED NOT DETECTED   Staphylococcus epidermidis NOT DETECTED NOT DETECTED   Staphylococcus lugdunensis NOT DETECTED NOT DETECTED   Streptococcus species NOT DETECTED NOT DETECTED   Streptococcus agalactiae NOT DETECTED NOT DETECTED   Streptococcus pneumoniae NOT DETECTED NOT DETECTED   Streptococcus pyogenes NOT DETECTED NOT DETECTED   A.calcoaceticus-baumannii NOT DETECTED NOT DETECTED   Bacteroides fragilis NOT DETECTED NOT DETECTED   Enterobacterales DETECTED (A) NOT DETECTED   Enterobacter cloacae complex NOT DETECTED NOT DETECTED   Escherichia coli NOT DETECTED NOT DETECTED   Klebsiella aerogenes NOT DETECTED NOT DETECTED   Klebsiella oxytoca NOT DETECTED NOT DETECTED   Klebsiella pneumoniae NOT DETECTED NOT DETECTED   Proteus species DETECTED (A) NOT DETECTED   Salmonella species NOT DETECTED NOT DETECTED   Serratia marcescens NOT DETECTED NOT DETECTED   Haemophilus influenzae NOT  DETECTED NOT DETECTED   Neisseria meningitidis NOT DETECTED NOT DETECTED   Pseudomonas aeruginosa NOT DETECTED NOT DETECTED   Stenotrophomonas maltophilia NOT DETECTED NOT DETECTED   Candida albicans NOT DETECTED NOT DETECTED   Candida auris NOT DETECTED NOT DETECTED   Candida glabrata NOT DETECTED NOT DETECTED   Candida krusei NOT DETECTED NOT DETECTED   Candida parapsilosis NOT DETECTED NOT DETECTED   Candida tropicalis NOT DETECTED NOT DETECTED   Cryptococcus neoformans/gattii NOT DETECTED NOT DETECTED   CTX-M ESBL NOT DETECTED NOT DETECTED   Carbapenem resistance IMP NOT DETECTED NOT DETECTED   Carbapenem resistance KPC NOT DETECTED NOT DETECTED   Carbapenem resistance NDM NOT DETECTED NOT DETECTED   Carbapenem resist OXA 48 LIKE NOT DETECTED NOT DETECTED   Carbapenem resistance VIM NOT DETECTED NOT DETECTED    [name] D 05/23/2021  5:32 AM

## 2021-05-23 NOTE — Progress Notes (Addendum)
CHIEF COMPLAINT:   Chief Complaint  Patient presents with   Altered Mental Status    Subjective  S/p VP shunt removal and IC drain placement Extubated successfully   EVENTS 10/10 admitted or severe sepsis from infected Vp shunt 10/11    1. Removal of complete ventriculoatrial shunt              2. Placement of left parietal approach ventriculostomy 10/11 extubated successfully 10/12 SD status      Objective    ROS Lethargic but arousable  Examination:  General exam: Appears calm and comfortable  Respiratory system: Clear to auscultation. Respiratory effort normal. HEENT: [place]/AT, PERRLA, no thrush, no stridor. Cardiovascular system: S1 & S2 heard, RRR. No JVD, murmurs, rubs, gallops or clicks. No pedal edema. Gastrointestinal system: Abdomen is nondistended, soft and nontender. No organomegaly or masses felt. Normal bowel sounds heard. Central nervous system: lethargic, EVD in place Psychiatry: lethargic  VITALS:  height is 5\' 2"  (1.575 m) and weight is 88.3 kg. Her temperature is 99.7 F (37.6 C). Her blood pressure is 164/103 (abnormal) and her pulse is 109 (abnormal). Her respiration is 15 and oxygen saturation is 97%.   I personally reviewed Labs under Results section.   CBC    Component Value Date/Time   WBC 19.0 (H) 05/23/2021 0430   RBC 4.11 05/23/2021 0430   HGB 13.4 05/23/2021 0430   HCT 39.5 05/23/2021 0430   PLT 401 (H) 05/23/2021 0430   MCV 96.1 05/23/2021 0430   MCH 32.6 05/23/2021 0430   MCHC 33.9 05/23/2021 0430   RDW 13.9 05/23/2021 0430   LYMPHSABS 0.9 05/23/2021 0430   MONOABS 1.6 (H) 05/23/2021 0430   EOSABS 0.0 05/23/2021 0430   BASOSABS 0.0 05/23/2021 0430   BMP Latest Ref Rng & Units 05/23/2021 05/22/2021 05/21/2021  Glucose 70 - 99 mg/dL 108(H) 93 129(H)  BUN 8 - 23 mg/dL 76(H) 88(H) 84(H)  Creatinine 0.44 - 1.00 mg/dL 1.81(H) 2.60(H) 2.81(H)  Sodium 135 - 145 mmol/L 144 140 137  Potassium 3.5 - 5.1 mmol/L 4.2 3.8 4.8   Chloride 98 - 111 mmol/L 111 109 97(L)  CO2 22 - 32 mmol/L 22 18(L) 17(L)  Calcium 8.9 - 10.3 mg/dL 8.1(L) 7.3(L) 8.8(L)       Radiology Reports DG Abd 1 View  Result Date: 05/22/2021 CLINICAL DATA:  Intubation and OG tube placement EXAM: PORTABLE CHEST 1 VIEW COMPARISON:  Chest 05/21/2021 FINDINGS: An endotracheal tube has been placed with its tip measuring 4.6 cm above the carina. An enteric tube has been placed with its tip in the right upper quadrant consistent with location in the distal stomach. Pre-existing right central venous catheter. Shallow inspiration. Heart size and pulmonary vascularity are normal. Linear atelectasis in the lung bases, progressing since prior study. No pleural effusions. No pneumothorax. Mediastinal contours appear intact. Visualized bowel gas pattern is normal. IMPRESSION: Appliances appear in satisfactory location. Electronically Signed   By: Lucienne Capers M.D.   On: 05/22/2021 03:55   CT HEAD WO CONTRAST (5MM)  Result Date: 05/21/2021 CLINICAL DATA:  Found unresponsive.  VP shunt in place. EXAM: CT HEAD WITHOUT CONTRAST CT CERVICAL SPINE WITHOUT CONTRAST CT CHEST, ABDOMEN AND PELVIS WITHOUT CONTRAST TECHNIQUE: Contiguous axial images were obtained from the base of the skull through the vertex without intravenous contrast. Multidetector CT imaging of the cervical spine was performed without intravenous contrast. Multiplanar CT image reconstructions were also generated. Multidetector CT imaging of the chest, abdomen and pelvis was performed  following the standard protocol without IV contrast. COMPARISON:  Pet CT 01/16/2011, chest x-ray 11/05/2012, CT miscellaneous 10/03/2011 FINDINGS: CT HEAD FINDINGS BRAIN: BRAIN Left posterior approach ventriculoperitoneal shunt with tip terminating midline along the anterior left lateral ventricle. Cerebral volume loss as well as interval development of mild hydrocephalus with surrounding periventricular edema. Patchy and  confluent areas of decreased attenuation are noted throughout the deep and periventricular white matter of the cerebral hemispheres bilaterally, compatible with chronic microvascular ischemic disease. No evidence of large-territorial acute infarction. No parenchymal hemorrhage. No mass lesion. No extra-axial collection. No mass effect or midline shift. No hydrocephalus. Basilar cisterns are patent. Vascular: No hyperdense vessel. Atherosclerotic calcifications are present within the cavernous internal carotid arteries. Skull: No acute fracture or focal lesion. Sinuses/Orbits: Paranasal sinuses and mastoid air cells are clear. The orbits are unremarkable. Other: None. CT CERVICAL FINDINGS Alignment: Normal. Skull base and vertebrae: No acute fracture. No aggressive appearing focal osseous lesion or focal pathologic process. Soft tissues and spinal canal: No prevertebral fluid or swelling. No visible canal hematoma. Upper chest: Unremarkable. Other: Left ventricular peritoneal shunt is noted to coil within the soft tissues of the neck. CT CHEST: Ports and Devices: Right chest wall Port-A-Cath with tip at the level of the superior cavoatrial junction. Left VA shunt terminates in the anterior mediastinum and no longer within the left brachiocephalic vein as noted on CT miscellaneous 10/03/2011. Lungs/airways: Ground-glass airspace opacity within the right lower lobe and patchy airspace opacity within left lower lobe. No focal consolidation. No pulmonary nodule. No pulmonary mass. No pulmonary contusion or laceration. No pneumatocele formation. The central airways are patent. Pleura: No pleural effusion. No pneumothorax. No hemothorax. Lymph Nodes: Limited evaluation for hilar lymphadenopathy on this noncontrast study. No mediastinal or axillary lymphadenopathy. Right axillary surgical clips. Mediastinum: No pneumomediastinum. No fat stranding within the mediastinum. No hematoma formation. The thoracic aorta is normal in  caliber. The heart is normal in size. No significant pericardial effusion. The esophagus is unremarkable. Interval development of an enlarged right thyroid gland with limited evaluation due to streak artifact. Chest Wall / Breasts: No chest wall mass. Musculoskeletal: No acute rib or sternal fracture. No spinal fracture. CT ABDOMEN / PELVIS: Liver: Not enlarged. No focal lesion. Biliary System: Limited evaluation due to motion artifact. The gallbladder is otherwise unremarkable with no radio-opaque gallstones. No biliary ductal dilatation. Pancreas: Normal pancreatic contour. No main pancreatic duct dilatation. Spleen: Not enlarged. No focal lesion. Adrenal Glands: No nodularity bilaterally. Kidneys: No hydroureteronephrosis. No nephroureterolithiasis. No contour deforming renal mass. Foley catheter terminates within the urinary bladder lumen. The urinary bladder is decompressed. Bowel: No small or large bowel wall thickening or dilatation. The appendix is unremarkable. Mesentery, Omentum, and Peritoneum: No simple free fluid ascites. No pneumoperitoneum. No mesenteric hematoma identified. No organized fluid collection. Pelvic Organs: The uterus and bilateral adnexal regions are unremarkable. Lymph Nodes: No abdominal, pelvic, inguinal lymphadenopathy. Vasculature: No abdominal aorta or iliac aneurysm. Musculoskeletal: No significant soft tissue hematoma. No acute pelvic fracture. No spinal fracture. IMPRESSION: 1. Interval development of mild hydrocephalus and periventricular edema. 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 The shunt is noted to be coiled within the neck. Recommend neurosurgery consultation. 2. No acute displaced fracture or traumatic listhesis of the cervical spine. 3. Ground-glass airspace opacity within the right lower lobe and patchy airspace opacity within left  lower lobe. Findings may represent  a combination of infection/inflammation as well as atelectasis. 4. No acute traumatic injury to the chest, abdomen, or pelvis with limited evaluation on this noncontrast study. 5. No acute fracture or traumatic malalignment of the thoracic or lumbar spine. 6.  Aortic Atherosclerosis (ICD10-I70.0). 7. Interval development of an enlarged right thyroid gland with limited evaluation due to streak artifact. Recommend thyroid ultrasound (ref: J Am Coll Radiol. 2015 Feb;12(2): 143-50). These results were called by telephone at the time of interpretation on 05/21/2021 at 9:48 pm to provider [facility] , who verbally acknowledged these results. Electronically Signed   By: Iven Finn M.D.   On: 05/21/2021 21:54   CT Cervical Spine Wo Contrast  Result Date: 05/21/2021 CLINICAL DATA:  Found unresponsive.  VP shunt in place. EXAM: CT HEAD WITHOUT CONTRAST CT CERVICAL SPINE WITHOUT CONTRAST CT CHEST, ABDOMEN AND PELVIS WITHOUT CONTRAST TECHNIQUE: Contiguous axial images were obtained from the base of the skull through the vertex without intravenous contrast. Multidetector CT imaging of the cervical spine was performed without intravenous contrast. Multiplanar CT image reconstructions were also generated. Multidetector CT imaging of the chest, abdomen and pelvis was performed following the standard protocol without IV contrast. COMPARISON:  Pet CT 01/16/2011, chest x-ray 11/05/2012, CT miscellaneous 10/03/2011 FINDINGS: CT HEAD FINDINGS BRAIN: BRAIN Left posterior approach ventriculoperitoneal shunt with tip terminating midline along the anterior left lateral ventricle. Cerebral volume loss as well as interval development of mild hydrocephalus with surrounding periventricular edema. Patchy and confluent areas of decreased attenuation are noted throughout the deep and periventricular white matter of the cerebral hemispheres bilaterally, compatible with chronic microvascular ischemic  disease. No evidence of large-territorial acute infarction. No parenchymal hemorrhage. No mass lesion. No extra-axial collection. No mass effect or midline shift. No hydrocephalus. Basilar cisterns are patent. Vascular: No hyperdense vessel. Atherosclerotic calcifications are present within the cavernous internal carotid arteries. Skull: No acute fracture or focal lesion. Sinuses/Orbits: Paranasal sinuses and mastoid air cells are clear. The orbits are unremarkable. Other: None. CT CERVICAL FINDINGS Alignment: Normal. Skull base and vertebrae: No acute fracture. No aggressive appearing focal osseous lesion or focal pathologic process. Soft tissues and spinal canal: No prevertebral fluid or swelling. No visible canal hematoma. Upper chest: Unremarkable. Other: Left ventricular peritoneal shunt is noted to coil within the soft tissues of the neck. CT CHEST: Ports and Devices: Right chest wall Port-A-Cath with tip at the level of the superior cavoatrial junction. Left VA shunt terminates in the anterior mediastinum and no longer within the left brachiocephalic vein as noted on CT miscellaneous 10/03/2011. Lungs/airways: Ground-glass airspace opacity within the right lower lobe and patchy airspace opacity within left lower lobe. No focal consolidation. No pulmonary nodule. No pulmonary mass. No pulmonary contusion or laceration. No pneumatocele formation. The central airways are patent. Pleura: No pleural effusion. No pneumothorax. No hemothorax. Lymph Nodes: Limited evaluation for hilar lymphadenopathy on this noncontrast study. No mediastinal or axillary lymphadenopathy. Right axillary surgical clips. Mediastinum: No pneumomediastinum. No fat stranding within the mediastinum. No hematoma formation. The thoracic aorta is normal in caliber. The heart is normal in size. No significant pericardial effusion. The esophagus is unremarkable. Interval development of an enlarged right thyroid gland with limited evaluation due  to streak artifact. Chest Wall / Breasts: No chest wall mass. Musculoskeletal: No acute rib or sternal fracture. No spinal fracture. CT ABDOMEN / PELVIS: Liver: Not enlarged. No focal lesion. Biliary System: Limited evaluation due to motion artifact. The gallbladder is otherwise unremarkable with no radio-opaque gallstones. No  biliary ductal dilatation. Pancreas: Normal pancreatic contour. No main pancreatic duct dilatation. Spleen: Not enlarged. No focal lesion. Adrenal Glands: No nodularity bilaterally. Kidneys: No hydroureteronephrosis. No nephroureterolithiasis. No contour deforming renal mass. Foley catheter terminates within the urinary bladder lumen. The urinary bladder is decompressed. Bowel: No small or large bowel wall thickening or dilatation. The appendix is unremarkable. Mesentery, Omentum, and Peritoneum: No simple free fluid ascites. No pneumoperitoneum. No mesenteric hematoma identified. No organized fluid collection. Pelvic Organs: The uterus and bilateral adnexal regions are unremarkable. Lymph Nodes: No abdominal, pelvic, inguinal lymphadenopathy. Vasculature: No abdominal aorta or iliac aneurysm. Musculoskeletal: No significant soft tissue hematoma. No acute pelvic fracture. No spinal fracture. IMPRESSION: 1. Interval development of mild hydrocephalus and periventricular edema. 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 The shunt is noted to be coiled within the neck. Recommend neurosurgery consultation. 2. No acute displaced fracture or traumatic listhesis of the cervical spine. 3. Ground-glass airspace opacity within the right lower lobe and patchy airspace opacity within left lower lobe. Findings may represent a combination of infection/inflammation as well as atelectasis. 4. No acute traumatic injury to the chest, abdomen, or pelvis with limited evaluation on  this noncontrast study. 5. No acute fracture or traumatic malalignment of the thoracic or lumbar spine. 6.  Aortic Atherosclerosis (ICD10-I70.0). 7. Interval development of an enlarged right thyroid gland with limited evaluation due to streak artifact. Recommend thyroid ultrasound (ref: J Am Coll Radiol. 2015 Feb;12(2): 143-50). These results were called by telephone at the time of interpretation on 05/21/2021 at 9:48 pm to provider [facility] , who verbally acknowledged these results. Electronically Signed   By: Iven Finn M.D.   On: 05/21/2021 21:54   DG Chest Port 1 View  Result Date: 05/22/2021 CLINICAL DATA:  Intubation and OG tube placement EXAM: PORTABLE CHEST 1 VIEW COMPARISON:  Chest 05/21/2021 FINDINGS: An endotracheal tube has been placed with its tip measuring 4.6 cm above the carina. An enteric tube has been placed with its tip in the right upper quadrant consistent with location in the distal stomach. Pre-existing right central venous catheter. Shallow inspiration. Heart size and pulmonary vascularity are normal. Linear atelectasis in the lung bases, progressing since prior study. No pleural effusions. No pneumothorax. Mediastinal contours appear intact. Visualized bowel gas pattern is normal. IMPRESSION: Appliances appear in satisfactory location. Electronically Signed   By: Lucienne Capers M.D.   On: 05/22/2021 03:55   DG Chest Portable 1 View  Result Date: 05/21/2021 CLINICAL DATA:  Found unresponsive. EXAM: PORTABLE CHEST 1 VIEW COMPARISON:  None. FINDINGS: A right-sided venous Port-A-Cath is seen with its distal tip noted at the junction of the superior vena cava and right atrium. Intact shunt tubing is seen overlying the left neck soft tissues and left upper lung. Very mild atelectasis is seen within the left lung base. There is no evidence of acute infiltrate, pleural effusion or pneumothorax. The heart size and mediastinal contours are within normal limits. The visualized  skeletal structures are unremarkable. IMPRESSION: 1. Very mild left basilar atelectasis without evidence of an acute infiltrate. Electronically Signed   By: Virgina Norfolk M.D.   On: 05/21/2021 19:32   CT CHEST ABDOMEN PELVIS WO CONTRAST  Result Date: 05/21/2021 CLINICAL DATA:  Found unresponsive.  VP shunt in place. EXAM: CT HEAD WITHOUT CONTRAST CT CERVICAL SPINE WITHOUT CONTRAST CT CHEST, ABDOMEN AND PELVIS WITHOUT CONTRAST TECHNIQUE: Contiguous  axial images were obtained from the base of the skull through the vertex without intravenous contrast. Multidetector CT imaging of the cervical spine was performed without intravenous contrast. Multiplanar CT image reconstructions were also generated. Multidetector CT imaging of the chest, abdomen and pelvis was performed following the standard protocol without IV contrast. COMPARISON:  Pet CT 01/16/2011, chest x-ray 11/05/2012, CT miscellaneous 10/03/2011 FINDINGS: CT HEAD FINDINGS BRAIN: BRAIN Left posterior approach ventriculoperitoneal shunt with tip terminating midline along the anterior left lateral ventricle. Cerebral volume loss as well as interval development of mild hydrocephalus with surrounding periventricular edema. Patchy and confluent areas of decreased attenuation are noted throughout the deep and periventricular white matter of the cerebral hemispheres bilaterally, compatible with chronic microvascular ischemic disease. No evidence of large-territorial acute infarction. No parenchymal hemorrhage. No mass lesion. No extra-axial collection. No mass effect or midline shift. No hydrocephalus. Basilar cisterns are patent. Vascular: No hyperdense vessel. Atherosclerotic calcifications are present within the cavernous internal carotid arteries. Skull: No acute fracture or focal lesion. Sinuses/Orbits: Paranasal sinuses and mastoid air cells are clear. The orbits are unremarkable. Other: None. CT CERVICAL FINDINGS Alignment: Normal. Skull base and  vertebrae: No acute fracture. No aggressive appearing focal osseous lesion or focal pathologic process. Soft tissues and spinal canal: No prevertebral fluid or swelling. No visible canal hematoma. Upper chest: Unremarkable. Other: Left ventricular peritoneal shunt is noted to coil within the soft tissues of the neck. CT CHEST: Ports and Devices: Right chest wall Port-A-Cath with tip at the level of the superior cavoatrial junction. Left VA shunt terminates in the anterior mediastinum and no longer within the left brachiocephalic vein as noted on CT miscellaneous 10/03/2011. Lungs/airways: Ground-glass airspace opacity within the right lower lobe and patchy airspace opacity within left lower lobe. No focal consolidation. No pulmonary nodule. No pulmonary mass. No pulmonary contusion or laceration. No pneumatocele formation. The central airways are patent. Pleura: No pleural effusion. No pneumothorax. No hemothorax. Lymph Nodes: Limited evaluation for hilar lymphadenopathy on this noncontrast study. No mediastinal or axillary lymphadenopathy. Right axillary surgical clips. Mediastinum: No pneumomediastinum. No fat stranding within the mediastinum. No hematoma formation. The thoracic aorta is normal in caliber. The heart is normal in size. No significant pericardial effusion. The esophagus is unremarkable. Interval development of an enlarged right thyroid gland with limited evaluation due to streak artifact. Chest Wall / Breasts: No chest wall mass. Musculoskeletal: No acute rib or sternal fracture. No spinal fracture. CT ABDOMEN / PELVIS: Liver: Not enlarged. No focal lesion. Biliary System: Limited evaluation due to motion artifact. The gallbladder is otherwise unremarkable with no radio-opaque gallstones. No biliary ductal dilatation. Pancreas: Normal pancreatic contour. No main pancreatic duct dilatation. Spleen: Not enlarged. No focal lesion. Adrenal Glands: No nodularity bilaterally. Kidneys: No  hydroureteronephrosis. No nephroureterolithiasis. No contour deforming renal mass. Foley catheter terminates within the urinary bladder lumen. The urinary bladder is decompressed. Bowel: No small or large bowel wall thickening or dilatation. The appendix is unremarkable. Mesentery, Omentum, and Peritoneum: No simple free fluid ascites. No pneumoperitoneum. No mesenteric hematoma identified. No organized fluid collection. Pelvic Organs: The uterus and bilateral adnexal regions are unremarkable. Lymph Nodes: No abdominal, pelvic, inguinal lymphadenopathy. Vasculature: No abdominal aorta or iliac aneurysm. Musculoskeletal: No significant soft tissue hematoma. No acute pelvic fracture. No spinal fracture. IMPRESSION: 1. Interval development of mild hydrocephalus and periventricular edema. 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  The shunt is noted to be coiled within the neck. Recommend neurosurgery consultation. 2. No acute displaced fracture or traumatic listhesis of the cervical spine. 3. Ground-glass airspace opacity within the right lower lobe and patchy airspace opacity within left lower lobe. Findings may represent a combination of infection/inflammation as well as atelectasis. 4. No acute traumatic injury to the chest, abdomen, or pelvis with limited evaluation on this noncontrast study. 5. No acute fracture or traumatic malalignment of the thoracic or lumbar spine. 6.  Aortic Atherosclerosis (ICD10-I70.0). 7. Interval development of an enlarged right thyroid gland with limited evaluation due to streak artifact. Recommend thyroid ultrasound (ref: J Am Coll Radiol. 2015 Feb;12(2): 143-50). These results were called by telephone at the time of interpretation on 05/21/2021 at 9:48 pm to provider [facility] , who verbally acknowledged these results. Electronically Signed   By: Iven Finn M.D.   On: 05/21/2021 21:54       Assessment/Plan:  65 yo admitted for sepsis infected VP shunt s/p post resp failure resolved with EVD in place, renal failure improving  NOT on pressors not on vent  Continue IV abx Follow up cultures Supportive care Follow up neurosurgery recs ICU need resolving Transfer care to [facility], M.D.  [facility]  Medical Director [place] Director [facility] Cardio-Pulmonary Department

## 2021-05-24 ENCOUNTER — Encounter: Payer: Self-pay | Admitting: Internal Medicine

## 2021-05-24 ENCOUNTER — Inpatient Hospital Stay: Payer: Medicare (Managed Care)

## 2021-05-24 DIAGNOSIS — G9340 Encephalopathy, unspecified: Secondary | ICD-10-CM | POA: Diagnosis not present

## 2021-05-24 DIAGNOSIS — Z515 Encounter for palliative care: Secondary | ICD-10-CM | POA: Diagnosis not present

## 2021-05-24 DIAGNOSIS — T85618A Breakdown (mechanical) of other specified internal prosthetic devices, implants and grafts, initial encounter: Secondary | ICD-10-CM | POA: Diagnosis not present

## 2021-05-24 DIAGNOSIS — A419 Sepsis, unspecified organism: Secondary | ICD-10-CM | POA: Diagnosis not present

## 2021-05-24 DIAGNOSIS — J9601 Acute respiratory failure with hypoxia: Secondary | ICD-10-CM | POA: Diagnosis not present

## 2021-05-24 DIAGNOSIS — Z7189 Other specified counseling: Secondary | ICD-10-CM | POA: Diagnosis not present

## 2021-05-24 LAB — BASIC METABOLIC PANEL
Anion gap: 8 (ref 5–15)
BUN: 60 mg/dL — ABNORMAL HIGH (ref 8–23)
CO2: 26 mmol/L (ref 22–32)
Calcium: 8.1 mg/dL — ABNORMAL LOW (ref 8.9–10.3)
Chloride: 114 mmol/L — ABNORMAL HIGH (ref 98–111)
Creatinine, Ser: 1.38 mg/dL — ABNORMAL HIGH (ref 0.44–1.00)
GFR, Estimated: 43 mL/min — ABNORMAL LOW (ref >60)
Glucose, Bld: 117 mg/dL — ABNORMAL HIGH (ref 70–99)
Potassium: 4.4 mmol/L (ref 3.5–5.1)
Sodium: 148 mmol/L — ABNORMAL HIGH (ref 135–145)

## 2021-05-24 LAB — CBC
HCT: 39.2 % (ref 36.0–46.0)
Hemoglobin: 13.3 g/dL (ref 12.0–15.0)
MCH: 33.7 pg (ref 26.0–34.0)
MCHC: 33.9 g/dL (ref 30.0–36.0)
MCV: 99.2 fL (ref 80.0–100.0)
Platelets: 366 10*3/uL (ref 150–400)
RBC: 3.95 MIL/uL (ref 3.87–5.11)
RDW: 14.4 % (ref 11.5–15.5)
WBC: 18.2 10*3/uL — ABNORMAL HIGH (ref 4.0–10.5)
nRBC: 0 % (ref 0.0–0.2)

## 2021-05-24 LAB — GLUCOSE, CAPILLARY
Glucose-Capillary: 108 mg/dL — ABNORMAL HIGH (ref 70–99)
Glucose-Capillary: 96 mg/dL (ref 70–99)
Glucose-Capillary: 105 mg/dL — ABNORMAL HIGH (ref 70–99)
Glucose-Capillary: 137 mg/dL — ABNORMAL HIGH (ref 70–99)
Glucose-Capillary: 111 mg/dL — ABNORMAL HIGH (ref 70–99)

## 2021-05-24 LAB — MAGNESIUM: Magnesium: 3.1 mg/dL — ABNORMAL HIGH (ref 1.7–2.4)

## 2021-05-24 LAB — PHOSPHORUS: Phosphorus: 2.3 mg/dL — ABNORMAL LOW (ref 2.5–4.6)

## 2021-05-24 LAB — HEMOGLOBIN AND HEMATOCRIT, BLOOD
HCT: 39.2 % (ref 36.0–46.0)
Hemoglobin: 13.2 g/dL (ref 12.0–15.0)

## 2021-05-24 LAB — PROCALCITONIN: Procalcitonin: 0.47 ng/mL

## 2021-05-24 MED ORDER — HYDRALAZINE HCL 20 MG/ML IJ SOLN
10.0000 mg | Freq: Once | INTRAMUSCULAR | Status: AC
  Administered 2021-05-24: 10 mg via INTRAVENOUS
  Filled 2021-05-24: qty 1

## 2021-05-24 MED ORDER — LINEZOLID 600 MG/300ML IV SOLN
600.0000 mg | Freq: Two times a day (BID) | INTRAVENOUS | Status: DC
  Administered 2021-05-24 – 2021-05-30 (×12): 600 mg via INTRAVENOUS
  Filled 2021-05-24 (×13): qty 300

## 2021-05-24 MED ORDER — IPRATROPIUM-ALBUTEROL 0.5-2.5 (3) MG/3ML IN SOLN
3.0000 mL | Freq: Four times a day (QID) | RESPIRATORY_TRACT | Status: DC
  Administered 2021-05-24 – 2021-05-31 (×24): 3 mL via RESPIRATORY_TRACT
  Filled 2021-05-24 (×26): qty 3

## 2021-05-24 MED ORDER — CHLORHEXIDINE GLUCONATE CLOTH 2 % EX PADS
6.0000 | MEDICATED_PAD | Freq: Every day | CUTANEOUS | Status: DC
  Administered 2021-05-25 – 2021-06-08 (×15): 6 via TOPICAL

## 2021-05-24 MED ORDER — BUDESONIDE 0.5 MG/2ML IN SUSP
0.5000 mg | Freq: Two times a day (BID) | RESPIRATORY_TRACT | Status: DC
  Administered 2021-05-24 – 2021-05-31 (×12): 0.5 mg via RESPIRATORY_TRACT
  Filled 2021-05-24 (×14): qty 2

## 2021-05-24 MED ORDER — COLLAGENASE 250 UNIT/GM EX OINT
TOPICAL_OINTMENT | Freq: Every day | CUTANEOUS | Status: DC
  Administered 2021-05-26 – 2021-06-04 (×4): 1 via TOPICAL
  Filled 2021-05-24 (×4): qty 30

## 2021-05-24 MED ORDER — MIDAZOLAM HCL 2 MG/2ML IJ SOLN
INTRAMUSCULAR | Status: AC
  Filled 2021-05-24: qty 2

## 2021-05-24 MED ORDER — ACETAMINOPHEN 10 MG/ML IV SOLN
1000.0000 mg | Freq: Four times a day (QID) | INTRAVENOUS | Status: AC | PRN
  Administered 2021-05-25: 1000 mg via INTRAVENOUS
  Filled 2021-05-24: qty 100

## 2021-05-24 NOTE — Progress Notes (Signed)
NAME:  Desiree Mitchell, MRN:  [id number], DOB:  [birth year], LOS: 2 ADMISSION DATE:  05/21/2021  65 year old female presented at [facility] ED on 05/21/2021 from home via EMS after being found unresponsive by family.  Patient was last seen normal on Saturday, 05/19/2021 at 4 PM.  On EMS arrival patient was unresponsive and hypoxic with SPO2 in the 70s and a normal blood sugar, EMS had difficulty getting a blood pressure.  An IO was placed in the right humeral head and the patient received 300 mL of LR, 40 mg of lidocaine and 25 mg of Phenergan.  Family later reported to EDP that they found her sitting on a toilet slumped with her neck up against a basket. Of note patient arrived infested with bed bugs and roaches.   Significant Hospital Events: Including procedures, antibiotic start and stop dates in addition to other pertinent events         10/10 admitted or severe sepsis from infected Vp shunt 10/11    1. Removal of complete ventriculoatrial shunt              2. Placement of left parietal approach ventriculostomy 10/11 extubated successfully 10/12 SD status 10/13 remains ICU status due to neuro checks          Micro Data:  COVID NEG HIV NEG   Microbiology results: 10/10 BCID - proteus spp in 1of4 & staph spp in 2of4 (no resistances detected),  BCID 1of4 MSSE 10/10 Bcx: GPCs & GNRs (pending speciation) 10/11 BCx:  10/11: CSF-NGTD   10/10: FLU/COV - negative    Antimicrobials:   Antibiotics Given (last 72 hours)     Date/Time Action Medication Dose Rate   05/21/21 2132 New Bag/Given   ceFEPIme (MAXIPIME) 2 g in sodium chloride 0.9 % 100 mL IVPB 2 g 200 mL/hr   05/21/21 2214 New Bag/Given   metroNIDAZOLE (FLAGYL) IVPB 500 mg 500 mg 100 mL/hr   05/21/21 2340 New Bag/Given   vancomycin (VANCOREADY) IVPB 2000 mg/400 mL 2,000 mg 200 mL/hr   05/22/21 2030 New Bag/Given   ceFEPIme (MAXIPIME) 2 g in sodium chloride 0.9 % 100 mL IVPB 2 g 200 mL/hr   05/23/21 1101 New Bag/Given    ceFEPIme (MAXIPIME) 2 g in sodium chloride 0.9 % 100 mL IVPB 2 g 200 mL/hr   05/23/21 2105 New Bag/Given   ceFEPIme (MAXIPIME) 2 g in sodium chloride 0.9 % 100 mL IVPB 2 g 200 mL/hr            Interim History / Subjective:  Remains lethargic EVD (drain in place) Elevated BP today will trial hydralazine High risk for aspiration Remains critically ill +vaginal bleeding       Objective   Blood pressure (!) 168/99, pulse 100, temperature 98.8 F (37.1 C), resp. rate 16, height 5\' 2"  (1.575 m), weight 85.8 kg, SpO2 99 %.        Intake/Output Summary (Last 24 hours) at 05/24/2021 0749 Last data filed at 05/24/2021 0600 Gross per 24 hour  Intake 206.83 ml  Output 1053 ml  Net -846.17 ml   Filed Weights   05/22/21 0455 05/23/21 0323 05/24/21 0317  Weight: 88.6 kg 88.3 kg 85.8 kg      REVIEW OF SYSTEMS  PATIENT IS UNABLE TO PROVIDE COMPLETE REVIEW OF SYSTEMS DUE TO SEVERE CRITICAL ILLNESS AND  METABOLIC ENCEPHALOPATHY   PHYSICAL EXAMINATION:  GENERAL:critically ill appearing,  EYES: Pupils equal, round, reactive to light.  No scleral icterus.  MOUTH: Moist mucosal  membrane.  NECK: Supple.  PULMONARY: +rhonchi, +wheezing CARDIOVASCULAR: S1 and S2.  No murmurs  GASTROINTESTINAL: Soft, nontender, -distended. Positive bowel sounds.  MUSCULOSKELETAL: No swelling, clubbing, or edema.  NEUROLOGIC: obtunded EVD in place SKIN:intact,warm,dry    Labs/imaging that I havepersonally reviewed  (right click and "Reselect all SmartList Selections" daily)       ASSESSMENT AND PLAN SYNOPSIS  65 yo admitted for sepsis infected VP shunt s/p post resp failure resolved with EVD in place, renal failure improving     Severe ACUTE Hypoxic and Hypercapnic Respiratory Failure Extubated but high risk for intubation and aspiration Oxygen as needed  CARDIAC ICU monitoring   ACUTE KIDNEY INJURY/Renal Failure -continue Foley Catheter-assess need -Avoid nephrotoxic  agents -Follow urine output, BMP -Ensure adequate renal perfusion, optimize oxygenation -Renal dose medications Obtain Nephrology consultation  Intake/Output Summary (Last 24 hours) at 05/24/2021 0749 Last data filed at 05/24/2021 0600 Gross per 24 hour  Intake 206.83 ml  Output 1053 ml  Net -846.17 ml     NEUROLOGY Acute toxic metabolic encephalopathy Infected Vp shunt Follow up NeuroSurgery RECS   SEVERE SEPSIS PRESENT ON ADMISSION SOURCE-infected VP shunt -use vasopressors to keep MAP>65 as needed -follow ABG and LA -follow up cultures Continue ABX  INFECTIOUS DISEASE -continue antibiotics as prescribed -follow up cultures -consider  ID consultation  ENDO - ICU hypoglycemic\Hyperglycemia protocol -check FSBS per protocol   GI GI PROPHYLAXIS as indicated  NUTRITIONAL STATUS DIET-->NPO Constipation protocol as indicated   ELECTROLYTES -follow labs as needed -replace as needed -pharmacy consultation and following   Best Practice (right click and "Reselect all SmartList Selections" daily)  Diet/type: NPO w/ meds via tube DVT prophylaxis: SCD GI prophylaxis: PPI Lines: Central line Foley:  Yes, and it is still needed Code Status:  full code  Labs   CBC: Recent Labs  Lab 05/21/21 1905 05/22/21 0342 05/23/21 0430 05/24/21 0403  WBC 30.3* 21.9* 19.0* 18.2*  NEUTROABS 27.1*  --  16.4*  --   HGB 15.9* 13.4 13.4 13.3  HCT 49.0* 38.9 39.5 39.2  MCV 96.3 95.3 96.1 99.2  PLT 491* 353 401* 098    Basic Metabolic Panel: Recent Labs  Lab 05/21/21 1905 05/22/21 0342 05/23/21 0430 05/24/21 0358  NA 137 140 144 148*  K 4.8 3.8 4.2 4.4  CL 97* 109 111 114*  CO2 17* 18* 22 26  GLUCOSE 129* 93 108* 117*  BUN 84* 88* 76* 60*  CREATININE 2.81* 2.60* 1.81* 1.38*  CALCIUM 8.8* 7.3* 8.1* 8.1*  MG  --  2.4 2.8* 3.1*  PHOS  --  7.8* 3.8 2.3*   GFR: Estimated Creatinine Clearance: 42.4 mL/min (A) (by C-G formula based on SCr of 1.38 mg/dL  (H)). Recent Labs  Lab 05/21/21 1905 05/21/21 2146 05/21/21 2344 05/22/21 0323 05/22/21 0342 05/23/21 0430 05/24/21 0358 05/24/21 0403  PROCALCITON  --   --   --  1.28  --  1.11 0.47  --   WBC 30.3*  --   --   --  21.9* 19.0*  --  18.2*  LATICACIDVEN  --  2.4* 2.2*  --  1.5  --   --   --     Liver Function Tests: Recent Labs  Lab 05/21/21 1905  AST 155*  ALT 59*  ALKPHOS 76  BILITOT 1.1  PROT 7.6  ALBUMIN 3.6   No results for input(s): LIPASE, AMYLASE in the last 168 hours. Recent Labs  Lab 05/21/21 1905  AMMONIA 29  ABG    Component Value Date/Time   PHART 7.45 05/23/2021 0940   PCO2ART 32 05/23/2021 0940   PO2ART 80 (L) 05/23/2021 0940   HCO3 22.2 05/23/2021 0940   ACIDBASEDEF 1.0 05/23/2021 0940   O2SAT 96.3 05/23/2021 0940     Coagulation Profile: No results for input(s): INR, PROTIME in the last 168 hours.  Cardiac Enzymes: Recent Labs  Lab 05/21/21 1905 05/22/21 0342  CKTOTAL 6,755* 5,609*    HbA1C: Hgb A1c MFr Bld  Date/Time Value Ref Range Status  05/22/2021 05:20 AM 5.8 (H) 4.8 - 5.6 % Final    Comment:    (NOTE)         Prediabetes: 5.7 - 6.4         Diabetes: >6.4         Glycemic control for adults with diabetes: <7.0     CBG: Recent Labs  Lab 05/23/21 1539 05/23/21 1914 05/23/21 2326 05/24/21 0314 05/24/21 0739  GLUCAP 101* 105* 102* 108* 96    Allergies No Known Allergies     DVT/GI PRX  assessed I Assessed the need for Labs I Assessed the need for Foley I Assessed the need for Central Venous Line Family Discussion when available I Assessed the need for Mobilization I made an Assessment of medications to be adjusted accordingly Safety Risk assessment completed  CASE DISCUSSED IN MULTIDISCIPLINARY ROUNDS WITH ICU TEAM     Critical Care Time devoted to patient care services described in this note is 65 minutes.  Critical care was necessary to treat or prevent imminent or life-threatening deterioration.    PATIENT WITH VERY POOR PROGNOSIS I ANTICIPATE PROLONGED ICU LOS  Patient with Multiorgan failure and at high risk for cardiac arrest and death.    Corrin Parker, M.D.  [facility]  Medical Director [place] Director [facility] Cardio-Pulmonary Department

## 2021-05-24 NOTE — Consult Note (Signed)
WOC Nurse Consult Note: Patient receiving care in [facility] IUC 3. Updated photos of the wound areas and bug bite regions were placed in the chart 05/23/21.  These are greatly appreciated.  The photos from 05/22/21 are appreciated as well! Reason for Consult: RN initiated a consult for wound areas.  Orders I had placed in the chart remained in place from 05/22/21.  However, I did modify them based on the photos from yesterday. Wound type: Unstageable PI to right ischial tuberosity. Moist wound bed to posterior neck--it was more consistent with an abrasion initially. Pressure Injury POA: Yes Wound bed: see photos for comparison Drainage (amount, consistency, odor)  Periwound: Dressing procedure/placement/frequency: Apply Santyl to the right ischial tuberosity (at the bottom of the right buttock area) in a nickel thick layer. Cover with a saline moistened gauze, then dry gauze or ABD pad.  Change daily.   I have also added an updated order for the posterior neck which includes washing with soap and water, drying, and application of Aquacel and ABD pad; perform daily.  And lastly, I put in an informational wound care order to use the Gerhardt's butt cream as outlined in the [facility] order.  Of note, more of these areas will continue to declare themselves over the upcoming days, and are very likely to require a change in therapy to [facility] for those that become unstageable with slough or eschar.  Monitor the wound area(s) for worsening of condition such as: Signs/symptoms of infection,  Increase in size,  Development of or worsening of odor, Development of pain, or increased pain at the affected locations.  Notify the medical team if any of these develop.  Thank you for the consult.  [place] nurse will not follow at this time.  Please re-consult the [place] team if needed.  Val Riles, RN, MSN, CWOCN, CNS-BC, pager [phone]

## 2021-05-24 NOTE — Progress Notes (Signed)
[place] for Electrolyte Monitoring and Replacement   Recent Labs: Potassium (mmol/L)  Date Value  05/24/2021 4.4   Magnesium (mg/dL)  Date Value  05/24/2021 3.1 (H)   Calcium (mg/dL)  Date Value  05/24/2021 8.1 (L)   Albumin (g/dL)  Date Value  05/21/2021 3.6   Phosphorus (mg/dL)  Date Value  05/24/2021 2.3 (L)   Sodium (mmol/L)  Date Value  05/24/2021 148 (H)    Assessment: 65yo Female with h/o SAH req'ing shunt >10y ago presents with AMS, leukocytosis, and renal failure. Pt went to OR to have shunt removed & EVD was placed. Pt transferred to ICU and intubated started on empiric abx. Pharmacy consulted for electrolyte mgmt.  Diet: NPO, MIVF discontinued yesterday  AKI resolving  Goal of Therapy:  Electrolytes within normal limits  Plan:  --Na 144 >> 148 w/ associated hyperchloremia. Suspect secondary to free water deficit in setting of NPO status. Defer management to PCCM team --Phosphorous mildly low at 2.3. Will continue to monitor for now. Anticipate repletion tomorrow if continues to downtrend --Will continue to follow along  Benita Gutter 05/24/2021 7:57 AM

## 2021-05-24 NOTE — Consult Note (Signed)
Consultation Note Date: 05/24/2021   Patient Name: Desiree Mitchell  DOB: Dec 18, 1955  MRN: [id number]  Age / Sex: 65 y.o., female  PCP: Pcp, No Referring Physician: Flora Lipps, MD  Reason for Consultation: Establishing goals of care  HPI/Patient Profile: 65 y.o. female  with past medical history of stroke in Nov 19, 2005 with ventral shunt in place and right-sided deficits, treated for breast cancer, obesity, breast cancer with BL mastectomy, HTN, bronchial asthma limited further health history admitted on 05/21/2021 with fall, shunt malfunction and sepsis.  Evaluated by neurosurgery who recommends removal of shunt and placing external ventricular drain on 10/10.  Clinical Assessment and Goals of Care: PMT attempted to see Mrs. Louderback but she is off the floor in CT.  Conference with nursing staff. PMT returns later in the day.  I have reviewed medical records including EPIC notes, labs and imaging, received report from RN, assessed the patient.  Mrs. Jarnagin is lying quietly in bed.  She appears acutely/chronically ill, morbidly obese.  Although she seems to be looking at me, she does not attempt to close her eyes when I reach my finger toward her eye.  She clearly cannot make her basic needs known.  There is no family at bedside at this time.   Detailed discussion with CCM attending, infectious disease, bedside nursing staff.   Call to daughter, [name], to discuss diagnosis prognosis, [place], EOL wishes, disposition and options.  I introduced Palliative Medicine as specialized medical care for people living with serious illness. It focuses on providing relief from the symptoms and stress of a serious illness. The goal is to improve quality of life for both the patient and the family.  We discussed a brief life review of the patient.  Mrs. Kelicia, Youtz, is a widow, her husband died in 11/19/2008.  She has a son, Elouise Munroe.  Daughter [name] shares that Arieal's brother, Dionicia Abler, would visit her often.  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  Nicole Kindred shares that her mother experienced breast cancer twice, the first time in her her 15s with a lumpectomy and the second time 2 years ago with a bilateral mastectomies and chemo/radiation.  She tells me that she was treated at the cancer center at [facility].  Mrs. Nordmann has a second epic chart MRN: [id number], note 10/01/2011 Dr. Noreene Filbert is informative.  We then focused on their current illness.  We talk in detail about her acute illness including, but not limited to, bacteremia and the treatment plan/ID consult, EVD and CT results/neurosurgery consult, respiratory status and aspiration risk managed by CCM.  The natural disease trajectory and expectations at EOL were discussed.  I attempted to elicit values and goals of care important to the patient.   Advanced directives, concepts specific to code status, were considered and discussed.  We talked about "treat the treatable, but allowing natural passing".  [name] shares that Alanii never spoke about her choices related to life support/end-of-life.  She  also tells me that she and Delfino Lovett have not discussed life support for their mother.  We talked about the seriousness of her declines, her risk for aspiration and reintubation.  [name] shares that she and Delfino Lovett will talk about CODE STATUS and intubation.  I encouraged her to reach out to medical staff if they elect to DNR.    Discussed the importance of continued conversation with family and the medical providers regarding overall plan of care and treatment options, ensuring decisions are within the context of the patient's values and GOCs. Questions and concerns were addressed.  The family was encouraged to call with questions or concerns.  PMT will continue to support holistically.  Conference with attending,  bedside nursing staff and [facility] team related to patient condition, needs and GOC.                      HCPOA   NEXT OF KIN -Mrs. Narang is a widow, her husband died in 2008/11/24.  She has 2 children, daughter Laurence Compton and son Elouise Munroe.  She also has a brother, Dionicia Abler with whom she is close.    SUMMARY OF RECOMMENDATIONS   At this point continue to treat the treatable Time for outcomes Considering CODE STATUS   Code Status/Advance Care Planning: Full code  Symptom Management:  Per attending, no additional needs at this time.   Palliative Prophylaxis:  Frequent Pain Assessment, Oral Care, and Turn Reposition  Additional Recommendations (Limitations, Scope, Preferences): Full Scope Treatment  Psycho-social/Spiritual:  Desire for further Chaplaincy support:no Additional Recommendations: Caregiving  Support/Resources and ICU Family Guide  Prognosis:  Unable to determine, based on outcomes, guarded at this point. In hospital death would not be surprising.   Discharge Planning:  to se determined, based on outcomes.        Primary Diagnoses: Present on Admission:  Sepsis ([place])   I have reviewed the medical record, interviewed the patient and family, and examined the patient. The following aspects are pertinent.  History reviewed. No pertinent past medical history. Social History   Socioeconomic History   Marital status: Widowed    Spouse name: Not on file   Number of children: Not on file   Years of education: Not on file   Highest education level: Not on file  Occupational History   Not on file  Tobacco Use   Smoking status: Not on file   Smokeless tobacco: Not on file  Substance and Sexual Activity   Alcohol use: Not on file   Drug use: Not on file   Sexual activity: Not on file  Other Topics Concern   Not on file  Social History Narrative   Not on file   Social Determinants of Health   Financial Resource Strain: Not on file  Food Insecurity: Not  on file  Transportation Needs: Not on file  Physical Activity: Not on file  Stress: Not on file  Social Connections: Not on file   History reviewed. No pertinent family history. Scheduled Meds:  budesonide (PULMICORT) nebulizer solution  0.5 mg Nebulization BID   Chlorhexidine Gluconate Cloth  6 each Topical Daily   collagenase   Topical Daily   enoxaparin (LOVENOX) injection  40 mg Subcutaneous Q24H   Gerhardt's butt cream   Topical QID   ipratropium-albuterol  3 mL Nebulization Q6H   pantoprazole (PROTONIX) IV  40 mg Intravenous QHS   sodium chloride flush  10-40 mL Intracatheter Q12H   Continuous Infusions:  acetaminophen  cefTRIAXone (ROCEPHIN)  IV Stopped (05/24/21 1039)   PRN Meds:.acetaminophen, albuterol, docusate sodium, polyethylene glycol, sodium chloride flush Medications Prior to Admission:  Prior to Admission medications   Not on File   No Known Allergies Review of Systems  Unable to perform ROS: Acuity of condition   Physical Exam Vitals and nursing note reviewed.  Constitutional:      General: She is not in acute distress.    Appearance: She is ill-appearing.  HENT:     Head:     Comments: EVD Cardiovascular:     Rate and Rhythm: Normal rate.  Pulmonary:     Effort: Pulmonary effort is normal. No respiratory distress.     Breath sounds: Rhonchi present.  Skin:    Comments: Multiple areas of bruising, scabs in various states of healing. Images reviewed in media tab   Neurological:     Comments: Does not respond, following some commands at times     Vital Signs: BP (!) 139/97   Pulse (!) 109   Temp 99 F (37.2 C)   Resp (!) 23   Ht 5\' 2"  (1.575 m)   Wt 85.8 kg   SpO2 91%   BMI 34.60 kg/m  Pain Scale: CPOT POSS *See Group Information*: 2-Acceptable,Slightly drowsy, easily aroused Pain Score: 0-No pain   SpO2: SpO2: 91 % O2 Device:SpO2: 91 % O2 Flow Rate: .O2 Flow Rate (L/min): 5 L/min  IO: Intake/output summary:  Intake/Output  Summary (Last 24 hours) at 05/24/2021 1213 Last data filed at 05/24/2021 1200 Gross per 24 hour  Intake 296.83 ml  Output 1285 ml  Net -988.17 ml    LBM: Last BM Date: 05/20/21 Baseline Weight: Weight: 99.8 kg Most recent weight: Weight: 85.8 kg     Palliative Assessment/Data:   Flowsheet Rows    Flowsheet Row Most Recent Value  Intake Tab   Referral Department Hospitalist  Unit at Time of Referral ICU  Palliative Care Primary Diagnosis Neurology  Date Notified 05/24/21  Palliative Care Type New Palliative care  Reason for referral Clarify Goals of Care  Date of Admission 05/21/21  Date first seen by Palliative Care 05/24/21  # of days Palliative referral response time 0 Day(s)  # of days IP prior to Palliative referral 3  Clinical Assessment   Palliative Performance Scale Score 20%  Pain Max last 24 hours Not able to report  Pain Min Last 24 hours Not able to report  Dyspnea Max Last 24 Hours Not able to report  Dyspnea Min Last 24 hours Not able to report  Psychosocial & Spiritual Assessment   Palliative Care Outcomes        Time In: 1220 Time Out: 1330  Time Total: 70 minutes  Greater than 50%  of this time was spent counseling and coordinating care related to the above assessment and plan.  Signed by: Drue Novel, NP   Please contact Palliative Medicine Team phone at [phone] for questions and concerns.  For individual provider: See Shea Evans

## 2021-05-24 NOTE — TOC Progression Note (Signed)
Transition of Care [facility]) - Progression Note    Patient Details  Name: Desiree Mitchell MRN: [id number] Date of Birth: Apr 10, 1956  [facility] of [facility] CM/SW [place], RN Phone Number: 05/24/2021, 3:34 PM  Clinical Narrative:    TOC will follow for outcome of family discussion related to goals of care. Palliative had meeting with daughter who will discuss with other family members and update tomorrow.    Expected Discharge Plan: Long Term Acute Care (LTAC) Barriers to Discharge: Continued Medical Work up  Expected Discharge Plan and Services Expected Discharge Plan: Long Term Acute Care (LTAC) In-house Referral: Clinical Social Work   Post Acute Care Choice: Long Term Acute Care (LTAC) Living arrangements for the past 2 months: Single Family Home                                       Social Determinants of Health (SDOH) Interventions    Readmission Risk Interventions No flowsheet data found.

## 2021-05-24 NOTE — Progress Notes (Signed)
    Attending Progress Note  History: Desiree Mitchell presenting on 05/21/21 with after being found unresponsive by family. She has a history of prior subarachnoid hemorrhage requiring shunt placement. Resulting in neurosurgery consult. CT head showed increased ventricular size concerning for shunt malfunction.  She underwent removal of shunt and placement of EVD on 05/21/21.   POD#3: NAEO  POD#2: patient extubated yesterday but increased O2 requirement overnight   POD#1: Currently off all sedation  Physical Exam: Vitals:   05/24/21 0700 05/24/21 0800  BP: (!) 168/99 (!) 166/105  Pulse: 100 99  Resp: 16 18  Temp: 98.8 F (37.1 C) 98.8 F (37.1 C)  SpO2: 99% 95%    On 5L O2 [place] PERRL Opens eyes to voice. Following simple one step commands. CSF remains clear.  EVD output 21 overnight   Data:  Recent Labs  Lab 05/22/21 0342 05/23/21 0430 05/24/21 0358  NA 140 144 148*  K 3.8 4.2 4.4  CL 109 111 114*  CO2 18* 22 26  BUN 88* 76* 60*  CREATININE 2.60* 1.81* 1.38*  GLUCOSE 93 108* 117*  CALCIUM 7.3* 8.1* 8.1*    Recent Labs  Lab 05/21/21 1905  AST 155*  ALT 59*  ALKPHOS 76      Recent Labs  Lab 05/22/21 0342 05/23/21 0430 05/24/21 0403  WBC 21.9* 19.0* 18.2*  HGB 13.4 13.4 13.3  HCT 38.9 39.5 39.2  PLT 353 401* 366    No results for input(s): APTT, INR in the last 168 hours.       Other tests/results:  CT head  IMPRESSION: 1. Interval development of mild hydrocephalus and periventricular edema. 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 The shunt is noted to be coiled within the neck. Recommend neurosurgery consultation. 2. No acute displaced fracture or traumatic listhesis of the cervical spine. 3. Ground-glass airspace opacity within the right lower lobe and patchy airspace opacity within left lower lobe. Findings  may represent a combination of infection/inflammation as well as atelectasis. 4. No acute traumatic injury to the chest, abdomen, or pelvis with limited evaluation on this noncontrast study.   5. No acute fracture or traumatic malalignment of the thoracic or lumbar spine. 6.  Aortic Atherosclerosis (ICD10-I70.0). 7. Interval development of an enlarged right thyroid gland with limited evaluation due to streak artifact. Recommend thyroid ultrasound (ref: J Am Coll Radiol. 2015 Feb;12(2): 143-50).   These results were called by telephone at the time of interpretation on 05/21/2021 at 9:48 pm to provider [facility] , who verbally acknowledged these results.   Electronically Signed   By: Iven Finn M.D.   On: 05/21/2021 21:54    Assessment/Plan:  Desiree Mitchell is a 65 y.o presenting to the ER after being found down by her family on 05/21/21. She was found to be septic and head CT was concerning for shunt malfunction.  She underwent removal of VA shunt on 05/21/2021 and placement of EVD.  - EVD in place. Increase to 15 today.  - obtain head CT. - continue q1 neuro checks - CSF cultures NTD - remainder of management per recommendations of critical care.  Cooper Render  Department of Neurosurgery

## 2021-05-24 NOTE — Consult Note (Signed)
Infectious Disease     Reason for Consult:bacteremia    Referring Physician: Dr Mortimer Fries Date of Admission:  05/21/2021   Principal Problem:   Sepsis ([place]) Active Problems:   Endotracheally intubated   On mechanically assisted ventilation (HCC)   Shunt malfunction   Rhabdomyolysis   AKI (acute kidney injury) ([place])   Increased anion gap metabolic acidosis   Acute encephalopathy   Transaminitis   Pressure injury of skin   HPI: Desiree Mitchell is a 65 y.o. female admitted 10/10 after being found down at home by family with hypoxia and hypotension. She apparently was found with her head up against a basket and was infested with bed bugs and roaches. ON admit wb 30, hypothermic. CT concerning for shunt malfunction so on 10/10 had removal of [facility] shunt and EVD placed. Started on cefepime, vanco and flagy initially. Blood Cx with proteus and  Staph species  (In one set) and staph epi in second set.  CT chest abd pelvis showed mild infiltrate vs atelectasis. CT head neck showed the distal end of shunt coiled in the neck. Since admit wbc down to 18. CSF cx neg.    History reviewed. No pertinent past medical history. Past Surgical History:  Procedure Laterality Date   SHUNT REMOVAL Left 05/21/2021   Procedure: SHUNT REMOVAL;  Surgeon: Meade Maw, MD;  Location: [facility] ORS;  Service: Neurosurgery;  Laterality: Left;   VENTRICULOSTOMY Left 05/21/2021   Procedure: VENTRICULOSTOMY;  Surgeon: Meade Maw, MD;  Location: [facility] ORS;  Service: Neurosurgery;  Laterality: Left;     History reviewed. No pertinent family history.  Allergies: No Known Allergies  Current antibiotics: Antibiotics Given (last 72 hours)     Date/Time Action Medication Dose Rate   05/21/21 2132 New Bag/Given   ceFEPIme (MAXIPIME) 2 g in sodium chloride 0.9 % 100 mL IVPB 2 g 200 mL/hr   05/21/21 2214 New Bag/Given   metroNIDAZOLE (FLAGYL) IVPB 500 mg 500 mg 100 mL/hr   05/21/21 2340 New Bag/Given   vancomycin  (VANCOREADY) IVPB 2000 mg/400 mL 2,000 mg 200 mL/hr   05/22/21 2030 New Bag/Given   ceFEPIme (MAXIPIME) 2 g in sodium chloride 0.9 % 100 mL IVPB 2 g 200 mL/hr   05/23/21 1101 New Bag/Given   ceFEPIme (MAXIPIME) 2 g in sodium chloride 0.9 % 100 mL IVPB 2 g 200 mL/hr   05/23/21 2105 New Bag/Given   ceFEPIme (MAXIPIME) 2 g in sodium chloride 0.9 % 100 mL IVPB 2 g 200 mL/hr   05/24/21 1009 New Bag/Given   cefTRIAXone (ROCEPHIN) 2 g in sodium chloride 0.9 % 100 mL IVPB 2 g 200 mL/hr       MEDICATIONS:  budesonide (PULMICORT) nebulizer solution  0.5 mg Nebulization BID   Chlorhexidine Gluconate Cloth  6 each Topical Daily   collagenase   Topical Daily   enoxaparin (LOVENOX) injection  40 mg Subcutaneous Q24H   Gerhardt's butt cream   Topical QID   ipratropium-albuterol  3 mL Nebulization Q6H   pantoprazole (PROTONIX) IV  40 mg Intravenous QHS   sodium chloride flush  10-40 mL Intracatheter Q12H    Review of Systems - unable to obtain   OBJECTIVE: Temp:  [98.6 F (37 C)-99.7 F (37.6 C)] 99 F (37.2 C) (10/13 1200) Pulse Rate:  [98-117] 109 (10/13 1200) Resp:  [14-38] 23 (10/13 1200) BP: (128-177)/(80-110) 139/97 (10/13 1200) SpO2:  [88 %-100 %] 91 % (10/13 1200) Weight:  [85.8 kg] 85.8 kg (10/13 0317) Physical Exam  Constitutional:  ill appearing HENT:  no scleral icterus Mouth/Throat: Oropharynx is dry Scalp with EVD in place.  Neck with large area of skin breakdown with rednes.  Cardiovascular: Normal rate, regular rhythm and normal heart sounds. .  Portacath in R chest wall is accessed- no redness Pulmonary/Chest: rhonchi bil   Neck = supple, no nuchal rigidity Abdominal: Soft. Bowel sounds are normal.  exhibits no distension. There is no tenderness.  Lymphadenopathy: no cervical adenopathy. No axillary adenopathy Neurological: min responsive but seems to open eyes to voice Skin: see photos, multiple ares of skin bruising and skin breakdown Psychiatric: min  responsive LABS: Results for orders placed or performed during the hospital encounter of 05/21/21 (from the past 48 hour(s))  Glucose, capillary     Status: Abnormal   Collection Time: 05/22/21  4:18 PM  Result Value Ref Range   Glucose-Capillary 101 (H) 70 - 99 mg/dL    Comment: Glucose reference range applies only to samples taken after fasting for at least 8 hours.  Glucose, capillary     Status: None   Collection Time: 05/22/21  8:35 PM  Result Value Ref Range   Glucose-Capillary 87 70 - 99 mg/dL    Comment: Glucose reference range applies only to samples taken after fasting for at least 8 hours.  Glucose, capillary     Status: None   Collection Time: 05/22/21 11:55 PM  Result Value Ref Range   Glucose-Capillary 91 70 - 99 mg/dL    Comment: Glucose reference range applies only to samples taken after fasting for at least 8 hours.  Glucose, capillary     Status: None   Collection Time: 05/23/21  3:54 AM  Result Value Ref Range   Glucose-Capillary 91 70 - 99 mg/dL    Comment: Glucose reference range applies only to samples taken after fasting for at least 8 hours.  Procalcitonin     Status: None   Collection Time: 05/23/21  4:30 AM  Result Value Ref Range   Procalcitonin 1.11 ng/mL    Comment:        Interpretation: PCT > 0.5 ng/mL and <= 2 ng/mL: Systemic infection (sepsis) is possible, but other conditions are known to elevate PCT as well. (NOTE)       Sepsis PCT Algorithm           Lower Respiratory Tract                                      Infection PCT Algorithm    ----------------------------     ----------------------------         PCT < 0.25 ng/mL                PCT < 0.10 ng/mL          Strongly encourage             Strongly discourage   discontinuation of antibiotics    initiation of antibiotics    ----------------------------     -----------------------------       PCT 0.25 - 0.50 ng/mL            PCT 0.10 - 0.25 ng/mL               OR       >80% decrease in  PCT            Discourage initiation of  antibiotics      Encourage discontinuation           of antibiotics    ----------------------------     -----------------------------         PCT >= 0.50 ng/mL              PCT 0.26 - 0.50 ng/mL                AND       <80% decrease in PCT             Encourage initiation of                                             antibiotics       Encourage continuation           of antibiotics    ----------------------------     -----------------------------        PCT >= 0.50 ng/mL                  PCT > 0.50 ng/mL               AND         increase in PCT                  Strongly encourage                                      initiation of antibiotics    Strongly encourage escalation           of antibiotics                                     -----------------------------                                           PCT <= 0.25 ng/mL                                                 OR                                        > 80% decrease in PCT                                      Discontinue / Do not initiate                                             antibiotics  Performed at [facility], [address], [place], [place] [address]   Basic metabolic panel     Status: Abnormal   Collection Time:  05/23/21  4:30 AM  Result Value Ref Range   Sodium 144 135 - 145 mmol/L   Potassium 4.2 3.5 - 5.1 mmol/L   Chloride 111 98 - 111 mmol/L   CO2 22 22 - 32 mmol/L   Glucose, Bld 108 (H) 70 - 99 mg/dL    Comment: Glucose reference range applies only to samples taken after fasting for at least 8 hours.   BUN 76 (H) 8 - 23 mg/dL   Creatinine, Ser 1.81 (H) 0.44 - 1.00 mg/dL   Calcium 8.1 (L) 8.9 - 10.3 mg/dL   GFR, Estimated 31 (L) >60 mL/min    Comment: (NOTE) Calculated using the CKD-EPI Creatinine Equation (2021)    Anion gap 11 5 - 15    Comment: Performed at [facility], [address] [place], [place] [address]  Magnesium     Status: Abnormal   Collection Time: 05/23/21  4:30 AM  Result Value Ref Range   Magnesium 2.8 (H) 1.7 - 2.4 mg/dL    Comment: Performed at [facility], [address] [place], [place] [address]  Phosphorus     Status: None   Collection Time: 05/23/21  4:30 AM  Result Value Ref Range   Phosphorus 3.8 2.5 - 4.6 mg/dL    Comment: Performed at [facility], [address] [place], [place] [address]  CBC with Differential/Platelet     Status: Abnormal   Collection Time: 05/23/21  4:30 AM  Result Value Ref Range   WBC 19.0 (H) 4.0 - 10.5 K/uL   RBC 4.11 3.87 - 5.11 MIL/uL   Hemoglobin 13.4 12.0 - 15.0 g/dL   HCT 39.5 36.0 - 46.0 %   MCV 96.1 80.0 - 100.0 fL   MCH 32.6 26.0 - 34.0 pg   MCHC 33.9 30.0 - 36.0 g/dL   RDW 13.9 11.5 - 15.5 %   Platelets 401 (H) 150 - 400 K/uL   nRBC 0.0 0.0 - 0.2 %   Neutrophils Relative % 85 %   Neutro Abs 16.4 (H) 1.7 - 7.7 K/uL   Lymphocytes Relative 5 %   Lymphs Abs 0.9 0.7 - 4.0 K/uL   Monocytes Relative 9 %   Monocytes Absolute 1.6 (H) 0.1 - 1.0 K/uL   Eosinophils Relative 0 %   Eosinophils Absolute 0.0 0.0 - 0.5 K/uL   Basophils Relative 0 %   Basophils Absolute 0.0 0.0 - 0.1 K/uL   Immature Granulocytes 1 %   Abs Immature Granulocytes 0.13 (H) 0.00 - 0.07 K/uL    Comment: Performed at [facility], [address] [place], [place] [address]  Glucose, capillary     Status: None   Collection Time: 05/23/21  7:27 AM  Result Value Ref Range   Glucose-Capillary 97 70 - 99 mg/dL    Comment: Glucose reference range applies only to samples taken after fasting for at least 8 hours.  Blood gas, arterial     Status: Abnormal (Preliminary result)   Collection Time: 05/23/21  9:40 AM  Result Value Ref Range   FIO2 21.00    pH, Arterial 7.45 7.350 - 7.450   pCO2 arterial 32 32.0 - 48.0 mmHg   pO2, Arterial 80 (L) 83.0 - 108.0 mmHg   Bicarbonate 22.2 20.0 - 28.0 mmol/L    Acid-base deficit 1.0 0.0 - 2.0 mmol/L   O2 Saturation 96.3 %   Patient temperature 37.0    Collection site LEFT RADIAL    Sample type ARTERIAL DRAW  Allens test (pass/fail) PASS PASS    Comment: Performed at [facility], [address] [place], [place] [address]   Mechanical Rate PENDING   Glucose, capillary     Status: Abnormal   Collection Time: 05/23/21 11:13 AM  Result Value Ref Range   Glucose-Capillary 100 (H) 70 - 99 mg/dL    Comment: Glucose reference range applies only to samples taken after fasting for at least 8 hours.  Glucose, capillary     Status: Abnormal   Collection Time: 05/23/21  3:39 PM  Result Value Ref Range   Glucose-Capillary 101 (H) 70 - 99 mg/dL    Comment: Glucose reference range applies only to samples taken after fasting for at least 8 hours.  Glucose, capillary     Status: Abnormal   Collection Time: 05/23/21  7:14 PM  Result Value Ref Range   Glucose-Capillary 105 (H) 70 - 99 mg/dL    Comment: Glucose reference range applies only to samples taken after fasting for at least 8 hours.  Glucose, capillary     Status: Abnormal   Collection Time: 05/23/21 11:26 PM  Result Value Ref Range   Glucose-Capillary 102 (H) 70 - 99 mg/dL    Comment: Glucose reference range applies only to samples taken after fasting for at least 8 hours.  Glucose, capillary     Status: Abnormal   Collection Time: 05/24/21  3:14 AM  Result Value Ref Range   Glucose-Capillary 108 (H) 70 - 99 mg/dL    Comment: Glucose reference range applies only to samples taken after fasting for at least 8 hours.  Procalcitonin     Status: None   Collection Time: 05/24/21  3:58 AM  Result Value Ref Range   Procalcitonin 0.47 ng/mL    Comment:        Interpretation: PCT (Procalcitonin) <= 0.5 ng/mL: Systemic infection (sepsis) is not likely. Local bacterial infection is possible. (NOTE)       Sepsis PCT Algorithm           Lower Respiratory Tract                                       Infection PCT Algorithm    ----------------------------     ----------------------------         PCT < 0.25 ng/mL                PCT < 0.10 ng/mL          Strongly encourage             Strongly discourage   discontinuation of antibiotics    initiation of antibiotics    ----------------------------     -----------------------------       PCT 0.25 - 0.50 ng/mL            PCT 0.10 - 0.25 ng/mL               OR       >80% decrease in PCT            Discourage initiation of                                            antibiotics      Encourage discontinuation  of antibiotics    ----------------------------     -----------------------------         PCT >= 0.50 ng/mL              PCT 0.26 - 0.50 ng/mL               AND        <80% decrease in PCT             Encourage initiation of                                             antibiotics       Encourage continuation           of antibiotics    ----------------------------     -----------------------------        PCT >= 0.50 ng/mL                  PCT > 0.50 ng/mL               AND         increase in PCT                  Strongly encourage                                      initiation of antibiotics    Strongly encourage escalation           of antibiotics                                     -----------------------------                                           PCT <= 0.25 ng/mL                                                 OR                                        > 80% decrease in PCT                                      Discontinue / Do not initiate                                             antibiotics  Performed at [facility], [address] [place], [place] [address]   Basic metabolic panel     Status: Abnormal   Collection Time: 05/24/21  3:58 AM  Result Value Ref Range   Sodium 148 (H) 135 - 145 mmol/L  Potassium 4.4 3.5 - 5.1 mmol/L   Chloride 114 (H) 98 - 111 mmol/L   CO2 26 22 - 32  mmol/L   Glucose, Bld 117 (H) 70 - 99 mg/dL    Comment: Glucose reference range applies only to samples taken after fasting for at least 8 hours.   BUN 60 (H) 8 - 23 mg/dL   Creatinine, Ser 1.38 (H) 0.44 - 1.00 mg/dL   Calcium 8.1 (L) 8.9 - 10.3 mg/dL   GFR, Estimated 43 (L) >60 mL/min    Comment: (NOTE) Calculated using the CKD-EPI Creatinine Equation (2021)    Anion gap 8 5 - 15    Comment: Performed at [facility], [address] [place], [place] [address]  Magnesium     Status: Abnormal   Collection Time: 05/24/21  3:58 AM  Result Value Ref Range   Magnesium 3.1 (H) 1.7 - 2.4 mg/dL    Comment: Performed at [facility], [address] [place], [place] [address]  Phosphorus     Status: Abnormal   Collection Time: 05/24/21  3:58 AM  Result Value Ref Range   Phosphorus 2.3 (L) 2.5 - 4.6 mg/dL    Comment: Performed at [facility], [address] [place], [place] [address]  CBC     Status: Abnormal   Collection Time: 05/24/21  4:03 AM  Result Value Ref Range   WBC 18.2 (H) 4.0 - 10.5 K/uL   RBC 3.95 3.87 - 5.11 MIL/uL   Hemoglobin 13.3 12.0 - 15.0 g/dL   HCT 39.2 36.0 - 46.0 %   MCV 99.2 80.0 - 100.0 fL   MCH 33.7 26.0 - 34.0 pg   MCHC 33.9 30.0 - 36.0 g/dL   RDW 14.4 11.5 - 15.5 %   Platelets 366 150 - 400 K/uL   nRBC 0.0 0.0 - 0.2 %    Comment: Performed at [facility], [address] [place], [place] [address]  Glucose, capillary     Status: None   Collection Time: 05/24/21  7:39 AM  Result Value Ref Range   Glucose-Capillary 96 70 - 99 mg/dL    Comment: Glucose reference range applies only to samples taken after fasting for at least 8 hours.   No components found for: ESR, C REACTIVE PROTEIN MICRO: Recent Results (from the past 720 hour(s))  Blood culture (routine x 2)     Status: Abnormal (Preliminary result)   Collection Time: 05/21/21  7:05 PM   Specimen: BLOOD  Result Value Ref Range Status   Specimen Description    Final    BLOOD LEFT ANTECUBITAL Performed at [facility], [address], [place], [place] [address]    Special Requests   Final    BOTTLES DRAWN AEROBIC AND ANAEROBIC Blood Culture adequate volume Performed at [facility], [address] [place], [place] [address]    Culture  Setup Time   Final    IN BOTH AEROBIC AND ANAEROBIC BOTTLES GRAM POSITIVE COCCI GRAM NEGATIVE RODS Organism ID to follow CRITICAL RESULT CALLED TO, READ BACK BY AND VERIFIED WITH: CARISSA [facility] AT 2054 05/22/21 BY [facility] Performed at [facility], [address] [place], [place] [address]    Culture (A)  Final    PROTEUS PENNERI SUSCEPTIBILITIES TO FOLLOW STAPHYLOCOCCUS EPIDERMIDIS THE SIGNIFICANCE OF ISOLATING THIS ORGANISM FROM A SINGLE SET OF BLOOD CULTURES WHEN MULTIPLE SETS ARE DRAWN IS UNCERTAIN. PLEASE NOTIFY THE MICROBIOLOGY DEPARTMENT WITHIN ONE WEEK IF SPECIATION AND SENSITIVITIES  ARE REQUIRED. Performed at [facility], [place] [address], [place], [place] [address]    Report Status PENDING  Incomplete  Blood Culture ID Panel (Reflexed)     Status: Abnormal   Collection Time: 05/21/21  7:05 PM  Result Value Ref Range Status   Enterococcus faecalis NOT DETECTED NOT DETECTED Final   Enterococcus Faecium NOT DETECTED NOT DETECTED Final   Listeria monocytogenes NOT DETECTED NOT DETECTED Final   Staphylococcus species DETECTED (A) NOT DETECTED Final    Comment: READ BACK AND VERIFIED BY CARISSA DOLAN AT 2054 05/22/21 BY [facility]   Staphylococcus aureus (BCID) NOT DETECTED NOT DETECTED Final   Staphylococcus epidermidis DETECTED (A) NOT DETECTED Final    Comment: READ BACK AND VERIFIED BY CARRISA DOLAN, AT 2045 05/22/21 BY [facility]   Staphylococcus lugdunensis NOT DETECTED NOT DETECTED Final   Streptococcus species NOT DETECTED NOT DETECTED Final   Streptococcus agalactiae NOT DETECTED NOT DETECTED Final   Streptococcus pneumoniae NOT DETECTED NOT DETECTED Final   Streptococcus  pyogenes NOT DETECTED NOT DETECTED Final   A.calcoaceticus-baumannii NOT DETECTED NOT DETECTED Final   Bacteroides fragilis NOT DETECTED NOT DETECTED Final   Enterobacterales NOT DETECTED NOT DETECTED Final   Enterobacter cloacae complex NOT DETECTED NOT DETECTED Final   Escherichia coli NOT DETECTED NOT DETECTED Final   Klebsiella aerogenes NOT DETECTED NOT DETECTED Final   Klebsiella oxytoca NOT DETECTED NOT DETECTED Final   Klebsiella pneumoniae NOT DETECTED NOT DETECTED Final   Proteus species NOT DETECTED NOT DETECTED Final   Salmonella species NOT DETECTED NOT DETECTED Final   Serratia marcescens NOT DETECTED NOT DETECTED Final   Haemophilus influenzae NOT DETECTED NOT DETECTED Final   Neisseria meningitidis NOT DETECTED NOT DETECTED Final   Pseudomonas aeruginosa NOT DETECTED NOT DETECTED Final   Stenotrophomonas maltophilia NOT DETECTED NOT DETECTED Final   Candida albicans NOT DETECTED NOT DETECTED Final   Candida auris NOT DETECTED NOT DETECTED Final   Candida glabrata NOT DETECTED NOT DETECTED Final   Candida krusei NOT DETECTED NOT DETECTED Final   Candida parapsilosis NOT DETECTED NOT DETECTED Final   Candida tropicalis NOT DETECTED NOT DETECTED Final   Cryptococcus neoformans/gattii NOT DETECTED NOT DETECTED Final   Methicillin resistance mecA/C NOT DETECTED NOT DETECTED Final    Comment: Performed at [facility], [address] [place], [place] [address]  Blood Culture ID Panel (Reflexed)     Status: Abnormal   Collection Time: 05/21/21  7:05 PM  Result Value Ref Range Status   Enterococcus faecalis NOT DETECTED NOT DETECTED Final   Enterococcus Faecium NOT DETECTED NOT DETECTED Final   Listeria monocytogenes NOT DETECTED NOT DETECTED Final   Staphylococcus species DETECTED (A) NOT DETECTED Final    Comment: CRITICAL RESULT CALLED TO, READ BACK BY AND VERIFIED WITH: JASON ROBBINS PHARMD 0522 05/23/21 [facility]    Staphylococcus aureus (BCID) NOT DETECTED NOT  DETECTED Final   Staphylococcus epidermidis NOT DETECTED NOT DETECTED Final   Staphylococcus lugdunensis NOT DETECTED NOT DETECTED Final   Streptococcus species NOT DETECTED NOT DETECTED Final   Streptococcus agalactiae NOT DETECTED NOT DETECTED Final   Streptococcus pneumoniae NOT DETECTED NOT DETECTED Final   Streptococcus pyogenes NOT DETECTED NOT DETECTED Final   A.calcoaceticus-baumannii NOT DETECTED NOT DETECTED Final   Bacteroides fragilis NOT DETECTED NOT DETECTED Final   Enterobacterales DETECTED (A) NOT DETECTED Final    Comment: Enterobacterales represent a large order of gram negative bacteria, not a single organism. CRITICAL RESULT CALLED TO, READ  BACK BY AND VERIFIED WITH: JASON ROBBINS PHARMD 0522 05/23/21 HNM    Enterobacter cloacae complex NOT DETECTED NOT DETECTED Final   Escherichia coli NOT DETECTED NOT DETECTED Final   Klebsiella aerogenes NOT DETECTED NOT DETECTED Final   Klebsiella oxytoca NOT DETECTED NOT DETECTED Final   Klebsiella pneumoniae NOT DETECTED NOT DETECTED Final   Proteus species DETECTED (A) NOT DETECTED Final    Comment: CRITICAL RESULT CALLED TO, READ BACK BY AND VERIFIED WITH: JASON ROBBINS PHARMD 0522 05/23/21 HNM    Salmonella species NOT DETECTED NOT DETECTED Final   Serratia marcescens NOT DETECTED NOT DETECTED Final   Haemophilus influenzae NOT DETECTED NOT DETECTED Final   Neisseria meningitidis NOT DETECTED NOT DETECTED Final   Pseudomonas aeruginosa NOT DETECTED NOT DETECTED Final   Stenotrophomonas maltophilia NOT DETECTED NOT DETECTED Final   Candida albicans NOT DETECTED NOT DETECTED Final   Candida auris NOT DETECTED NOT DETECTED Final   Candida glabrata NOT DETECTED NOT DETECTED Final   Candida krusei NOT DETECTED NOT DETECTED Final   Candida parapsilosis NOT DETECTED NOT DETECTED Final   Candida tropicalis NOT DETECTED NOT DETECTED Final   Cryptococcus neoformans/gattii NOT DETECTED NOT DETECTED Final   CTX-M ESBL NOT DETECTED  NOT DETECTED Final   Carbapenem resistance IMP NOT DETECTED NOT DETECTED Final   Carbapenem resistance KPC NOT DETECTED NOT DETECTED Final   Carbapenem resistance NDM NOT DETECTED NOT DETECTED Final   Carbapenem resist OXA 48 LIKE NOT DETECTED NOT DETECTED Final   Carbapenem resistance VIM NOT DETECTED NOT DETECTED Final    Comment: Performed at [facility], [address] [place], [place] [address]  Resp Panel by RT-PCR (Flu A&B, Covid)     Status: None   Collection Time: 05/21/21  9:46 PM   Specimen: Nasopharyngeal(NP) swabs in vial transport medium  Result Value Ref Range Status   SARS Coronavirus 2 by RT PCR NEGATIVE NEGATIVE Final    Comment: (NOTE) SARS-CoV-2 target nucleic acids are NOT DETECTED.  The SARS-CoV-2 RNA is generally detectable in upper respiratory specimens during the acute phase of infection. The lowest concentration of SARS-CoV-2 viral copies this assay can detect is 138 copies/mL. A negative result does not preclude SARS-Cov-2 infection and should not be used as the sole basis for treatment or other patient management decisions. A negative result may occur with  improper specimen collection/handling, submission of specimen other than nasopharyngeal swab, presence of viral mutation(s) within the areas targeted by this assay, and inadequate number of viral copies(<138 copies/mL). A negative result must be combined with clinical observations, patient history, and epidemiological information. The expected result is Negative.  Fact Sheet for Patients:  EntrepreneurPulse.com.au  Fact Sheet for Healthcare Providers:  IncredibleEmployment.be  This test is no t yet approved or cleared by the [place] FDA and  has been authorized for detection and/or diagnosis of SARS-CoV-2 by FDA under an Emergency Use Authorization (EUA). This EUA will remain  in effect (meaning this test can be used) for the duration of  the COVID-19 declaration under Section 564(b)(1) of the Act, 21 U.S.C.section 360bbb-3(b)(1), unless the authorization is terminated  or revoked sooner.       Influenza A by PCR NEGATIVE NEGATIVE Final   Influenza B by PCR NEGATIVE NEGATIVE Final    Comment: (NOTE) The Xpert Xpress SARS-CoV-2/FLU/RSV plus assay is intended as an aid in the diagnosis of influenza from Nasopharyngeal swab specimens and should not be used as a sole basis for treatment. Nasal washings  and aspirates are unacceptable for Xpert Xpress SARS-CoV-2/FLU/RSV testing.  Fact Sheet for Patients: EntrepreneurPulse.com.au  Fact Sheet for Healthcare Providers: IncredibleEmployment.be  This test is not yet approved or cleared by the [place] FDA and has been authorized for detection and/or diagnosis of SARS-CoV-2 by FDA under an Emergency Use Authorization (EUA). This EUA will remain in effect (meaning this test can be used) for the duration of the COVID-19 declaration under Section 564(b)(1) of the Act, 21 U.S.C. section 360bbb-3(b)(1), unless the authorization is terminated or revoked.  Performed at [facility], [address] [place], [place] [address]   Aerobic/Anaerobic Culture w Gram Stain (surgical/deep wound)     Status: None (Preliminary result)   Collection Time: 05/22/21  1:24 AM   Specimen: CSF; Body Fluid  Result Value Ref Range Status   Specimen Description   Final    CSF Performed at [facility], [address] [place], [place] [address]    Special Requests   Final    NONE Performed at [facility], [place], [place] [address]    Gram Stain NO WBC SEEN NO ORGANISMS SEEN   Final   Culture   Final    NO GROWTH 2 DAYS Performed at [facility], [place] [address] [place], [place] [address]    Report Status PENDING  Incomplete  Culture, blood (Routine X 2) w Reflex to ID Panel     Status: None  (Preliminary result)   Collection Time: 05/22/21  3:02 AM   Specimen: BLOOD  Result Value Ref Range Status   Specimen Description BLOOD RIGHT HAND  Final   Special Requests   Final    BOTTLES DRAWN AEROBIC ONLY Blood Culture results may not be optimal due to an inadequate volume of blood received in culture bottles   Culture   Final    NO GROWTH 2 DAYS Performed at [facility], [address] [place], [place] [address]    Report Status PENDING  Incomplete    IMAGING: DG Abd 1 View  Result Date: 05/22/2021 CLINICAL DATA:  Intubation and OG tube placement EXAM: PORTABLE CHEST 1 VIEW COMPARISON:  Chest 05/21/2021 FINDINGS: An endotracheal tube has been placed with its tip measuring 4.6 cm above the carina. An enteric tube has been placed with its tip in the right upper quadrant consistent with location in the distal stomach. Pre-existing right central venous catheter. Shallow inspiration. Heart size and pulmonary vascularity are normal. Linear atelectasis in the lung bases, progressing since prior study. No pleural effusions. No pneumothorax. Mediastinal contours appear intact. Visualized bowel gas pattern is normal. IMPRESSION: Appliances appear in satisfactory location. Electronically Signed   By: Lucienne Capers M.D.   On: 05/22/2021 03:55   CT HEAD WO CONTRAST (5MM)  Result Date: 05/24/2021 CLINICAL DATA:  Hydrocephalus EXAM: CT HEAD WITHOUT CONTRAST TECHNIQUE: Contiguous axial images were obtained from the base of the skull through the vertex without intravenous contrast. COMPARISON:  05/23/2021 FINDINGS: Brain: Left parietal approach ventriculostomy catheter terminates in the inferior right frontal lobe, unchanged. Overall unchanged size and configuration of ventricular system compared to 05/23/2021 and 05/21/2021. No acute infarction, hemorrhage, extra-axial collection, mass, mass effect, or midline shift. Periventricular white matter hypodensity, unchanged, which may  represent transependymal flow of CSF and/or sequela of chronic small vessel ischemic disease. Vascular: No hyperdense vessel. Skull: Right frontal and left parietal burr holes. No acute fracture. Sinuses/Orbits: Negative. Other: The mastoids are well aerated. IMPRESSION: Unchanged size  and configuration of the ventricular system, with unchanged position of the left parietal approach ventriculostomy catheter. Unchanged periventricular hypodensity, which may represent transependymal flow CSF and/or chronic small vessel ischemic disease. Electronically Signed   By: Merilyn Baba M.D.   On: 05/24/2021 11:52   CT HEAD WO CONTRAST (5MM)  Result Date: 05/23/2021 CLINICAL DATA:  Hydrocephalus EXAM: CT HEAD WITHOUT CONTRAST TECHNIQUE: Contiguous axial images were obtained from the base of the skull through the vertex without intravenous contrast. COMPARISON:  05/21/2021. FINDINGS: Brain: Status post interval shunt revision, with left parietal approach ventriculostomy catheter traversing the left lateral ventricle and third ventricle and terminating in inferior right frontal lobe. Overall unchanged size and configuration of the ventricular system compared to 05/21/2021. No acute infarction, hemorrhage, extra-axial collection, mass, mass effect, or midline shift. Periventricular white matter hypodensity, likely transependymal flow of CSF in addition to the sequela of chronic small vessel ischemic disease. Vascular: No hyperdense vessel. Skull: Right frontal and left parietal burr holes. Negative for acute fracture. Sinuses/Orbits: Minimal mucosal thickening in the left maxillary sinus. The orbits are unremarkable. Other: The mastoids are well aerated. Superficial skin staples overlying the left parietal scalp, with externalization of the extracranial ventriculostomy catheter. IMPRESSION: Status post interval shunt revision, with left parietal approach ventriculostomy catheter terminating in the inferior right frontal  lobe. Overall unchanged size and configuration of the ventricular system, with redemonstrated periventricular hypodensity, likely transependymal flow of CSF in addition to chronic small vessel ischemic disease. Electronically Signed   By: Merilyn Baba M.D.   On: 05/23/2021 11:09   CT HEAD WO CONTRAST (5MM)  Result Date: 05/21/2021 CLINICAL DATA:  Found unresponsive.  VP shunt in place. EXAM: CT HEAD WITHOUT CONTRAST CT CERVICAL SPINE WITHOUT CONTRAST CT CHEST, ABDOMEN AND PELVIS WITHOUT CONTRAST TECHNIQUE: Contiguous axial images were obtained from the base of the skull through the vertex without intravenous contrast. Multidetector CT imaging of the cervical spine was performed without intravenous contrast. Multiplanar CT image reconstructions were also generated. Multidetector CT imaging of the chest, abdomen and pelvis was performed following the standard protocol without IV contrast. COMPARISON:  Pet CT 01/16/2011, chest x-ray 11/05/2012, CT miscellaneous 10/03/2011 FINDINGS: CT HEAD FINDINGS BRAIN: BRAIN Left posterior approach ventriculoperitoneal shunt with tip terminating midline along the anterior left lateral ventricle. Cerebral volume loss as well as interval development of mild hydrocephalus with surrounding periventricular edema. Patchy and confluent areas of decreased attenuation are noted throughout the deep and periventricular white matter of the cerebral hemispheres bilaterally, compatible with chronic microvascular ischemic disease. No evidence of large-territorial acute infarction. No parenchymal hemorrhage. No mass lesion. No extra-axial collection. No mass effect or midline shift. No hydrocephalus. Basilar cisterns are patent. Vascular: No hyperdense vessel. Atherosclerotic calcifications are present within the cavernous internal carotid arteries. Skull: No acute fracture or focal lesion. Sinuses/Orbits: Paranasal sinuses and mastoid air cells are clear. The orbits are unremarkable. Other:  None. CT CERVICAL FINDINGS Alignment: Normal. Skull base and vertebrae: No acute fracture. No aggressive appearing focal osseous lesion or focal pathologic process. Soft tissues and spinal canal: No prevertebral fluid or swelling. No visible canal hematoma. Upper chest: Unremarkable. Other: Left ventricular peritoneal shunt is noted to coil within the soft tissues of the neck. CT CHEST: Ports and Devices: Right chest wall Port-A-Cath with tip at the level of the superior cavoatrial junction. Left VA shunt terminates in the anterior mediastinum and no longer within the left brachiocephalic vein as noted on CT miscellaneous 10/03/2011. Lungs/airways: Ground-glass airspace opacity within  the right lower lobe and patchy airspace opacity within left lower lobe. No focal consolidation. No pulmonary nodule. No pulmonary mass. No pulmonary contusion or laceration. No pneumatocele formation. The central airways are patent. Pleura: No pleural effusion. No pneumothorax. No hemothorax. Lymph Nodes: Limited evaluation for hilar lymphadenopathy on this noncontrast study. No mediastinal or axillary lymphadenopathy. Right axillary surgical clips. Mediastinum: No pneumomediastinum. No fat stranding within the mediastinum. No hematoma formation. The thoracic aorta is normal in caliber. The heart is normal in size. No significant pericardial effusion. The esophagus is unremarkable. Interval development of an enlarged right thyroid gland with limited evaluation due to streak artifact. Chest Wall / Breasts: No chest wall mass. Musculoskeletal: No acute rib or sternal fracture. No spinal fracture. CT ABDOMEN / PELVIS: Liver: Not enlarged. No focal lesion. Biliary System: Limited evaluation due to motion artifact. The gallbladder is otherwise unremarkable with no radio-opaque gallstones. No biliary ductal dilatation. Pancreas: Normal pancreatic contour. No main pancreatic duct dilatation. Spleen: Not enlarged. No focal lesion. Adrenal  Glands: No nodularity bilaterally. Kidneys: No hydroureteronephrosis. No nephroureterolithiasis. No contour deforming renal mass. Foley catheter terminates within the urinary bladder lumen. The urinary bladder is decompressed. Bowel: No small or large bowel wall thickening or dilatation. The appendix is unremarkable. Mesentery, Omentum, and Peritoneum: No simple free fluid ascites. No pneumoperitoneum. No mesenteric hematoma identified. No organized fluid collection. Pelvic Organs: The uterus and bilateral adnexal regions are unremarkable. Lymph Nodes: No abdominal, pelvic, inguinal lymphadenopathy. Vasculature: No abdominal aorta or iliac aneurysm. Musculoskeletal: No significant soft tissue hematoma. No acute pelvic fracture. No spinal fracture. IMPRESSION: 1. Interval development of mild hydrocephalus and periventricular edema. 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 The shunt is noted to be coiled within the neck. Recommend neurosurgery consultation. 2. No acute displaced fracture or traumatic listhesis of the cervical spine. 3. Ground-glass airspace opacity within the right lower lobe and patchy airspace opacity within left lower lobe. Findings may represent a combination of infection/inflammation as well as atelectasis. 4. No acute traumatic injury to the chest, abdomen, or pelvis with limited evaluation on this noncontrast study. 5. No acute fracture or traumatic malalignment of the thoracic or lumbar spine. 6.  Aortic Atherosclerosis (ICD10-I70.0). 7. Interval development of an enlarged right thyroid gland with limited evaluation due to streak artifact. Recommend thyroid ultrasound (ref: J Am Coll Radiol. 2015 Feb;12(2): 143-50). These results were called by telephone at the time of interpretation on 05/21/2021 at 9:48 pm to provider [facility] , who verbally acknowledged  these results. Electronically Signed   By: Iven Finn M.D.   On: 05/21/2021 21:54   CT Cervical Spine Wo Contrast  Result Date: 05/21/2021 CLINICAL DATA:  Found unresponsive.  VP shunt in place. EXAM: CT HEAD WITHOUT CONTRAST CT CERVICAL SPINE WITHOUT CONTRAST CT CHEST, ABDOMEN AND PELVIS WITHOUT CONTRAST TECHNIQUE: Contiguous axial images were obtained from the base of the skull through the vertex without intravenous contrast. Multidetector CT imaging of the cervical spine was performed without intravenous contrast. Multiplanar CT image reconstructions were also generated. Multidetector CT imaging of the chest, abdomen and pelvis was performed following the standard protocol without IV contrast. COMPARISON:  Pet CT 01/16/2011, chest x-ray 11/05/2012, CT miscellaneous 10/03/2011 FINDINGS: CT HEAD FINDINGS BRAIN: BRAIN Left posterior approach ventriculoperitoneal shunt with tip terminating midline along the anterior left lateral ventricle. Cerebral volume loss as well as interval development of mild  hydrocephalus with surrounding periventricular edema. Patchy and confluent areas of decreased attenuation are noted throughout the deep and periventricular white matter of the cerebral hemispheres bilaterally, compatible with chronic microvascular ischemic disease. No evidence of large-territorial acute infarction. No parenchymal hemorrhage. No mass lesion. No extra-axial collection. No mass effect or midline shift. No hydrocephalus. Basilar cisterns are patent. Vascular: No hyperdense vessel. Atherosclerotic calcifications are present within the cavernous internal carotid arteries. Skull: No acute fracture or focal lesion. Sinuses/Orbits: Paranasal sinuses and mastoid air cells are clear. The orbits are unremarkable. Other: None. CT CERVICAL FINDINGS Alignment: Normal. Skull base and vertebrae: No acute fracture. No aggressive appearing focal osseous lesion or focal pathologic process. Soft tissues and spinal  canal: No prevertebral fluid or swelling. No visible canal hematoma. Upper chest: Unremarkable. Other: Left ventricular peritoneal shunt is noted to coil within the soft tissues of the neck. CT CHEST: Ports and Devices: Right chest wall Port-A-Cath with tip at the level of the superior cavoatrial junction. Left VA shunt terminates in the anterior mediastinum and no longer within the left brachiocephalic vein as noted on CT miscellaneous 10/03/2011. Lungs/airways: Ground-glass airspace opacity within the right lower lobe and patchy airspace opacity within left lower lobe. No focal consolidation. No pulmonary nodule. No pulmonary mass. No pulmonary contusion or laceration. No pneumatocele formation. The central airways are patent. Pleura: No pleural effusion. No pneumothorax. No hemothorax. Lymph Nodes: Limited evaluation for hilar lymphadenopathy on this noncontrast study. No mediastinal or axillary lymphadenopathy. Right axillary surgical clips. Mediastinum: No pneumomediastinum. No fat stranding within the mediastinum. No hematoma formation. The thoracic aorta is normal in caliber. The heart is normal in size. No significant pericardial effusion. The esophagus is unremarkable. Interval development of an enlarged right thyroid gland with limited evaluation due to streak artifact. Chest Wall / Breasts: No chest wall mass. Musculoskeletal: No acute rib or sternal fracture. No spinal fracture. CT ABDOMEN / PELVIS: Liver: Not enlarged. No focal lesion. Biliary System: Limited evaluation due to motion artifact. The gallbladder is otherwise unremarkable with no radio-opaque gallstones. No biliary ductal dilatation. Pancreas: Normal pancreatic contour. No main pancreatic duct dilatation. Spleen: Not enlarged. No focal lesion. Adrenal Glands: No nodularity bilaterally. Kidneys: No hydroureteronephrosis. No nephroureterolithiasis. No contour deforming renal mass. Foley catheter terminates within the urinary bladder lumen.  The urinary bladder is decompressed. Bowel: No small or large bowel wall thickening or dilatation. The appendix is unremarkable. Mesentery, Omentum, and Peritoneum: No simple free fluid ascites. No pneumoperitoneum. No mesenteric hematoma identified. No organized fluid collection. Pelvic Organs: The uterus and bilateral adnexal regions are unremarkable. Lymph Nodes: No abdominal, pelvic, inguinal lymphadenopathy. Vasculature: No abdominal aorta or iliac aneurysm. Musculoskeletal: No significant soft tissue hematoma. No acute pelvic fracture. No spinal fracture. IMPRESSION: 1. Interval development of mild hydrocephalus and periventricular edema. 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 The shunt is noted to be coiled within the neck. Recommend neurosurgery consultation. 2. No acute displaced fracture or traumatic listhesis of the cervical spine. 3. Ground-glass airspace opacity within the right lower lobe and patchy airspace opacity within left lower lobe. Findings may represent a combination of infection/inflammation as well as atelectasis. 4. No acute traumatic injury to the chest, abdomen, or pelvis with limited evaluation on this noncontrast study. 5. No acute fracture or traumatic malalignment of the thoracic or lumbar spine. 6.  Aortic Atherosclerosis (ICD10-I70.0). 7. Interval development of an enlarged  right thyroid gland with limited evaluation due to streak artifact. Recommend thyroid ultrasound (ref: J Am Coll Radiol. 2015 Feb;12(2): 143-50). These results were called by telephone at the time of interpretation on 05/21/2021 at 9:48 pm to provider [facility] , who verbally acknowledged these results. Electronically Signed   By: Iven Finn M.D.   On: 05/21/2021 21:54   DG Chest Port 1 View  Result Date: 05/24/2021 CLINICAL DATA:  Hypoxia EXAM: PORTABLE CHEST 1 VIEW  COMPARISON:  05/22/2021 FINDINGS: Endotracheal tube and NG tube removed. Port-A-Cath tip in the SVC unchanged. Cardiac enlargement.  Negative for heart failure or edema Progression of left lower lobe consolidation. Mild improvement in right lower lobe atelectasis. No significant effusion. IMPRESSION: Endotracheal tube removed. Progression of left lower lobe atelectasis with improvement in right lower lobe atelectasis. Electronically Signed   By: Franchot Gallo M.D.   On: 05/24/2021 10:50   DG Chest Port 1 View  Result Date: 05/22/2021 CLINICAL DATA:  Intubation and OG tube placement EXAM: PORTABLE CHEST 1 VIEW COMPARISON:  Chest 05/21/2021 FINDINGS: An endotracheal tube has been placed with its tip measuring 4.6 cm above the carina. An enteric tube has been placed with its tip in the right upper quadrant consistent with location in the distal stomach. Pre-existing right central venous catheter. Shallow inspiration. Heart size and pulmonary vascularity are normal. Linear atelectasis in the lung bases, progressing since prior study. No pleural effusions. No pneumothorax. Mediastinal contours appear intact. Visualized bowel gas pattern is normal. IMPRESSION: Appliances appear in satisfactory location. Electronically Signed   By: Lucienne Capers M.D.   On: 05/22/2021 03:55   DG Chest Portable 1 View  Result Date: 05/21/2021 CLINICAL DATA:  Found unresponsive. EXAM: PORTABLE CHEST 1 VIEW COMPARISON:  None. FINDINGS: A right-sided venous Port-A-Cath is seen with its distal tip noted at the junction of the superior vena cava and right atrium. Intact shunt tubing is seen overlying the left neck soft tissues and left upper lung. Very mild atelectasis is seen within the left lung base. There is no evidence of acute infiltrate, pleural effusion or pneumothorax. The heart size and mediastinal contours are within normal limits. The visualized skeletal structures are unremarkable. IMPRESSION: 1. Very mild left basilar  atelectasis without evidence of an acute infiltrate. Electronically Signed   By: Virgina Norfolk M.D.   On: 05/21/2021 19:32   CT CHEST ABDOMEN PELVIS WO CONTRAST  Result Date: 05/21/2021 CLINICAL DATA:  Found unresponsive.  VP shunt in place. EXAM: CT HEAD WITHOUT CONTRAST CT CERVICAL SPINE WITHOUT CONTRAST CT CHEST, ABDOMEN AND PELVIS WITHOUT CONTRAST TECHNIQUE: Contiguous axial images were obtained from the base of the skull through the vertex without intravenous contrast. Multidetector CT imaging of the cervical spine was performed without intravenous contrast. Multiplanar CT image reconstructions were also generated. Multidetector CT imaging of the chest, abdomen and pelvis was performed following the standard protocol without IV contrast. COMPARISON:  Pet CT 01/16/2011, chest x-ray 11/05/2012, CT miscellaneous 10/03/2011 FINDINGS: CT HEAD FINDINGS BRAIN: BRAIN Left posterior approach ventriculoperitoneal shunt with tip terminating midline along the anterior left lateral ventricle. Cerebral volume loss as well as interval development of mild hydrocephalus with surrounding periventricular edema. Patchy and confluent areas of decreased attenuation are noted throughout the deep and periventricular white matter of the cerebral hemispheres bilaterally, compatible with chronic microvascular ischemic disease. No evidence of large-territorial acute infarction. No parenchymal hemorrhage. No mass lesion. No extra-axial collection. No mass effect or midline shift. No hydrocephalus. Basilar cisterns are patent.  Vascular: No hyperdense vessel. Atherosclerotic calcifications are present within the cavernous internal carotid arteries. Skull: No acute fracture or focal lesion. Sinuses/Orbits: Paranasal sinuses and mastoid air cells are clear. The orbits are unremarkable. Other: None. CT CERVICAL FINDINGS Alignment: Normal. Skull base and vertebrae: No acute fracture. No aggressive appearing focal osseous lesion or focal  pathologic process. Soft tissues and spinal canal: No prevertebral fluid or swelling. No visible canal hematoma. Upper chest: Unremarkable. Other: Left ventricular peritoneal shunt is noted to coil within the soft tissues of the neck. CT CHEST: Ports and Devices: Right chest wall Port-A-Cath with tip at the level of the superior cavoatrial junction. Left VA shunt terminates in the anterior mediastinum and no longer within the left brachiocephalic vein as noted on CT miscellaneous 10/03/2011. Lungs/airways: Ground-glass airspace opacity within the right lower lobe and patchy airspace opacity within left lower lobe. No focal consolidation. No pulmonary nodule. No pulmonary mass. No pulmonary contusion or laceration. No pneumatocele formation. The central airways are patent. Pleura: No pleural effusion. No pneumothorax. No hemothorax. Lymph Nodes: Limited evaluation for hilar lymphadenopathy on this noncontrast study. No mediastinal or axillary lymphadenopathy. Right axillary surgical clips. Mediastinum: No pneumomediastinum. No fat stranding within the mediastinum. No hematoma formation. The thoracic aorta is normal in caliber. The heart is normal in size. No significant pericardial effusion. The esophagus is unremarkable. Interval development of an enlarged right thyroid gland with limited evaluation due to streak artifact. Chest Wall / Breasts: No chest wall mass. Musculoskeletal: No acute rib or sternal fracture. No spinal fracture. CT ABDOMEN / PELVIS: Liver: Not enlarged. No focal lesion. Biliary System: Limited evaluation due to motion artifact. The gallbladder is otherwise unremarkable with no radio-opaque gallstones. No biliary ductal dilatation. Pancreas: Normal pancreatic contour. No main pancreatic duct dilatation. Spleen: Not enlarged. No focal lesion. Adrenal Glands: No nodularity bilaterally. Kidneys: No hydroureteronephrosis. No nephroureterolithiasis. No contour deforming renal mass. Foley catheter  terminates within the urinary bladder lumen. The urinary bladder is decompressed. Bowel: No small or large bowel wall thickening or dilatation. The appendix is unremarkable. Mesentery, Omentum, and Peritoneum: No simple free fluid ascites. No pneumoperitoneum. No mesenteric hematoma identified. No organized fluid collection. Pelvic Organs: The uterus and bilateral adnexal regions are unremarkable. Lymph Nodes: No abdominal, pelvic, inguinal lymphadenopathy. Vasculature: No abdominal aorta or iliac aneurysm. Musculoskeletal: No significant soft tissue hematoma. No acute pelvic fracture. No spinal fracture. IMPRESSION: 1. Interval development of mild hydrocephalus and periventricular edema. 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 The shunt is noted to be coiled within the neck. Recommend neurosurgery consultation. 2. No acute displaced fracture or traumatic listhesis of the cervical spine. 3. Ground-glass airspace opacity within the right lower lobe and patchy airspace opacity within left lower lobe. Findings may represent a combination of infection/inflammation as well as atelectasis. 4. No acute traumatic injury to the chest, abdomen, or pelvis with limited evaluation on this noncontrast study. 5. No acute fracture or traumatic malalignment of the thoracic or lumbar spine. 6.  Aortic Atherosclerosis (ICD10-I70.0). 7. Interval development of an enlarged right thyroid gland with limited evaluation due to streak artifact. Recommend thyroid ultrasound (ref: J Am Coll Radiol. 2015 Feb;12(2): 143-50). These results were called by telephone at the time of interpretation on 05/21/2021 at 9:48 pm to provider [place] [facility] , who verbally acknowledged these results. Electronically Signed   By: Iven Finn M.D.   On:  05/21/2021 21:54    Assessment:   Desiree Mitchell is a 65 y.o. 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 She had extensive skin bruising and also a wound on post neck where her head had rested against a basket. Also has multiple skin lesions and was apparently infested with bedbugs and cockroaches on admission.  She was found to have malfunctioning of her VA shunt with the distal end coiled in the neck.  She underwent removal of her VA shunt as well as placement of external ventricular drain.  She was found to have bacteremia with 1 set of cultures growing Proteus as well as staph species and the set of blood cultures other growing staph epidermidis.  She does have a Port-A-Cath in place.  I suspect her bacteremia is from her skin on her legs as well as newer lesions on her being down especially infected appearing wound on the posterior neck.  No evidence that her VP shunt was infected apparently.  Cultures are negative.  She has had CT of her chest abdomen and pelvis.  Only finding was some mild atelectasis. She has been on cefepime and has received 1 dose of Vanco Follow-up blood cultures 2/11 no growth to date COVID.  Recommendations She seems to be clearing her cultures so I would continue current cefepime until we have results of sensitivities. Start linezolid for GP coverage given skin and soft tissue infection Continue wound care. Hopefully her Port-A-Cath has not been infected and will not need to be removed but we will have to continue to monitor this.  We can hopefully treat her with IV antibiotics. Very poor prognosis overall. Thank you very much for allowing me to participate in the care of this patient. Please call with questions.   Cheral Marker. Ola Spurr, MD

## 2021-05-25 ENCOUNTER — Inpatient Hospital Stay: Payer: Medicare (Managed Care)

## 2021-05-25 ENCOUNTER — Encounter: Payer: Self-pay | Admitting: Radiology

## 2021-05-25 DIAGNOSIS — T85618A Breakdown (mechanical) of other specified internal prosthetic devices, implants and grafts, initial encounter: Secondary | ICD-10-CM | POA: Diagnosis not present

## 2021-05-25 DIAGNOSIS — G9340 Encephalopathy, unspecified: Secondary | ICD-10-CM | POA: Diagnosis not present

## 2021-05-25 DIAGNOSIS — Z515 Encounter for palliative care: Secondary | ICD-10-CM | POA: Diagnosis not present

## 2021-05-25 DIAGNOSIS — A419 Sepsis, unspecified organism: Secondary | ICD-10-CM | POA: Diagnosis not present

## 2021-05-25 DIAGNOSIS — R6520 Severe sepsis without septic shock: Secondary | ICD-10-CM | POA: Diagnosis not present

## 2021-05-25 DIAGNOSIS — N179 Acute kidney failure, unspecified: Secondary | ICD-10-CM | POA: Diagnosis not present

## 2021-05-25 DIAGNOSIS — Z7189 Other specified counseling: Secondary | ICD-10-CM | POA: Diagnosis not present

## 2021-05-25 DIAGNOSIS — R7881 Bacteremia: Secondary | ICD-10-CM

## 2021-05-25 DIAGNOSIS — M6282 Rhabdomyolysis: Secondary | ICD-10-CM

## 2021-05-25 LAB — BASIC METABOLIC PANEL
Anion gap: 8 (ref 5–15)
BUN: 46 mg/dL — ABNORMAL HIGH (ref 8–23)
CO2: 25 mmol/L (ref 22–32)
Calcium: 8.1 mg/dL — ABNORMAL LOW (ref 8.9–10.3)
Chloride: 118 mmol/L — ABNORMAL HIGH (ref 98–111)
Creatinine, Ser: 1.19 mg/dL — ABNORMAL HIGH (ref 0.44–1.00)
GFR, Estimated: 51 mL/min — ABNORMAL LOW (ref >60)
Glucose, Bld: 118 mg/dL — ABNORMAL HIGH (ref 70–99)
Potassium: 4.2 mmol/L (ref 3.5–5.1)
Sodium: 151 mmol/L — ABNORMAL HIGH (ref 135–145)

## 2021-05-25 LAB — HEPATIC FUNCTION PANEL
ALT: 41 U/L (ref 0–44)
AST: 47 U/L — ABNORMAL HIGH (ref 15–41)
Albumin: 2.5 g/dL — ABNORMAL LOW (ref 3.5–5.0)
Alkaline Phosphatase: 58 U/L (ref 38–126)
Bilirubin, Direct: 0.4 mg/dL — ABNORMAL HIGH (ref 0.0–0.2)
Indirect Bilirubin: 0.6 mg/dL (ref 0.3–0.9)
Total Bilirubin: 1 mg/dL (ref 0.3–1.2)
Total Protein: 6.2 g/dL — ABNORMAL LOW (ref 6.5–8.1)

## 2021-05-25 LAB — CBC
HCT: 40.4 % (ref 36.0–46.0)
Hemoglobin: 13.6 g/dL (ref 12.0–15.0)
MCH: 33.7 pg (ref 26.0–34.0)
MCHC: 33.7 g/dL (ref 30.0–36.0)
MCV: 100 fL (ref 80.0–100.0)
Platelets: 379 10*3/uL (ref 150–400)
RBC: 4.04 MIL/uL (ref 3.87–5.11)
RDW: 14.6 % (ref 11.5–15.5)
WBC: 17.2 10*3/uL — ABNORMAL HIGH (ref 4.0–10.5)
nRBC: 0 % (ref 0.0–0.2)

## 2021-05-25 LAB — MAGNESIUM: Magnesium: 3.1 mg/dL — ABNORMAL HIGH (ref 1.7–2.4)

## 2021-05-25 LAB — GLUCOSE, CAPILLARY
Glucose-Capillary: 102 mg/dL — ABNORMAL HIGH (ref 70–99)
Glucose-Capillary: 101 mg/dL — ABNORMAL HIGH (ref 70–99)
Glucose-Capillary: 111 mg/dL — ABNORMAL HIGH (ref 70–99)
Glucose-Capillary: 134 mg/dL — ABNORMAL HIGH (ref 70–99)
Glucose-Capillary: 107 mg/dL — ABNORMAL HIGH (ref 70–99)
Glucose-Capillary: 116 mg/dL — ABNORMAL HIGH (ref 70–99)
Glucose-Capillary: 151 mg/dL — ABNORMAL HIGH (ref 70–99)

## 2021-05-25 LAB — SODIUM: Sodium: 149 mmol/L — ABNORMAL HIGH (ref 135–145)

## 2021-05-25 LAB — CK: Total CK: 430 U/L — ABNORMAL HIGH (ref 38–234)

## 2021-05-25 LAB — PHOSPHORUS: Phosphorus: 2.8 mg/dL (ref 2.5–4.6)

## 2021-05-25 MED ORDER — DEXTROSE IN LACTATED RINGERS 5 % IV SOLN
INTRAVENOUS | Status: DC
  Administered 2021-05-25: 75 mL/h via INTRAVENOUS

## 2021-05-25 MED ORDER — ORAL CARE MOUTH RINSE
15.0000 mL | OROMUCOSAL | Status: DC
  Administered 2021-05-25 – 2021-05-26 (×5): 15 mL via OROMUCOSAL

## 2021-05-25 MED ORDER — CHLORHEXIDINE GLUCONATE 0.12% ORAL RINSE (MEDLINE KIT)
15.0000 mL | Freq: Two times a day (BID) | OROMUCOSAL | Status: DC
  Administered 2021-05-25 – 2021-06-08 (×26): 15 mL via OROMUCOSAL

## 2021-05-25 MED ORDER — MAGNESIUM SULFATE 4 GM/100ML IV SOLN
4.0000 g | Freq: Once | INTRAVENOUS | Status: AC
  Administered 2021-05-25: 4 g via INTRAVENOUS
  Filled 2021-05-25: qty 100

## 2021-05-25 MED ORDER — THIAMINE HCL 100 MG/ML IJ SOLN
100.0000 mg | Freq: Every day | INTRAMUSCULAR | Status: DC
  Administered 2021-05-25 – 2021-05-31 (×7): 100 mg via INTRAVENOUS
  Filled 2021-05-25 (×7): qty 2

## 2021-05-25 MED ORDER — DEXTROSE 5 % IV SOLN: INTRAVENOUS | Status: DC

## 2021-05-25 MED ORDER — ALTEPLASE 2 MG IJ SOLR
2.0000 mg | Freq: Once | INTRAMUSCULAR | Status: AC
  Administered 2021-05-25: 2 mg

## 2021-05-25 NOTE — Progress Notes (Signed)
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 Pt went to OR to have shunt removed & EVD was placed. Pt transferred to ICU and intubated started on empiric abx. Pharmacy consulted for electrolyte mgmt.  Diet: NPO  Goal of Therapy:  Electrolytes within normal limits  Plan:  4 grams IV magnesium sulfate x 1 Hypernatremia: 5% dextrose at 75 mL/hr (MD order) Will continue to follow along  Desiree Mitchell 05/25/2021 7:04 AM

## 2021-05-25 NOTE — Progress Notes (Addendum)
1330 Foley changed out for MRI. 8 Down to MRI via bed. Drain turned off to patient so she will not drain excessively. Patient wide awake and looking around. Drain re-zeroed after return to ICU. Carrying on conversation with nurse. Also talked with RT this morning. Can name children. States she has lost some days, not sure how many days. Remembers nothing of her previous days including coming to the hospital.Very concerned about watching her game shows on TV. States her daughter does not check on her very often. States any  movement hurts. Bug bite areas starting to sluff off top layer of skin.

## 2021-05-25 NOTE — Progress Notes (Signed)
Palliative: Mrs. Uhls is lying quietly in bed.  She will briefly open her eyes, but not make or keep eye contact.  She appears acutely/chronically ill, frail, obese.  She is unable to make her needs known.  There is no family at bedside at this time.  Call to daughter, Laurence Compton for an update.  No answer, left somewhat detailed voicemail message. PMT to continue to follow.  Conference with attending, bedside nursing staff, transition of care team related to patient condition, needs, goals of care.  Plan: At this point continue to treat the treatable, full scope/full code.  Time for outcomes.  25 minutes  Quinn Axe, NP Palliative medicine team Team phone [phone] Greater than 50% of this time was spent counseling and coordinating care related to the above assessment and plan.

## 2021-05-25 NOTE — Progress Notes (Signed)
Attending Progress Note  History: Lilyannah Zuelke presenting on 05/21/21 with after being found unresponsive by family. She has a history of prior subarachnoid hemorrhage requiring shunt placement. Resulting in neurosurgery consult. CT head showed increased ventricular size concerning for shunt malfunction.  She underwent removal of shunt and placement of EVD on 05/21/21.   POD#3: raised EVD to 15 yesterday. NAEO  POD#2: patient extubated yesterday but increased O2 requirement overnight   POD#1: Currently off all sedation  Physical Exam: Vitals:   05/25/21 0600 05/25/21 0630  BP: (!) 169/116 (!) 184/116  Pulse: 95 (!) 102  Resp: (!) 21 19  Temp: 98.2 F (36.8 C) 98.2 F (36.8 C)  SpO2: 99% 95%    On 5L O2  PERRL Opens eyes to painful stimuli. Localizes to pain on RUE and withdrawals on BLE  Incision sites c/d/I.  CSF remains clear.  EVD output 23 overnight   Data:  Recent Labs  Lab 05/23/21 0430 05/24/21 0358 05/25/21 0517  NA 144 148* 151*  K 4.2 4.4 4.2  CL 111 114* 118*  CO2 22 26 25   BUN 76* 60* 46*  CREATININE 1.81* 1.38* 1.19*  GLUCOSE 108* 117* 118*  CALCIUM 8.1* 8.1* 8.1*    Recent Labs  Lab 05/21/21 1905  AST 155*  ALT 59*  ALKPHOS 76      Recent Labs  Lab 05/23/21 0430 05/24/21 0403 05/24/21 1446 05/25/21 0517  WBC 19.0* 18.2*  --  17.2*  HGB 13.4 13.3   < > 13.6  HCT 39.5 39.2   < > 40.4  PLT 401* 366  --  379   < > = values in this interval not displayed.    No results for input(s): APTT, INR in the last 168 hours.       Other tests/results:  CT head  IMPRESSION: 1. Interval development of mild hydrocephalus and periventricular edema. 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 The shunt is noted to be coiled within the neck. Recommend neurosurgery consultation. 2. No acute displaced fracture  or traumatic listhesis of the cervical spine. 3. Ground-glass airspace opacity within the right lower lobe and patchy airspace opacity within left lower lobe. Findings may represent a combination of infection/inflammation as well as atelectasis. 4. No acute traumatic injury to the chest, abdomen, or pelvis with limited evaluation on this noncontrast study.   5. No acute fracture or traumatic malalignment of the thoracic or lumbar spine. 6.  Aortic Atherosclerosis (ICD10-I70.0). 7. Interval development of an enlarged right thyroid gland with limited evaluation due to streak artifact. Recommend thyroid ultrasound (ref: J Am Coll Radiol. 2015 Feb;12(2): 143-50).   These results were called by telephone at the time of interpretation on 05/21/2021 at 9:48 pm to provider [facility] , who verbally acknowledged these results.   Electronically Signed   By: Iven Finn M.D.   On: 05/21/2021 21:54    Assessment/Plan:  Carylon Tamburro is a 65 y.o presenting to the ER after being found down by her family on 05/21/21. She was found to be septic and head CT was concerning for shunt malfunction.  She underwent removal of VA shunt on 05/21/2021 and placement of EVD.  - EVD in placed at 18 mmH20. CSF remains clear. Will continue to attempt to wean over the weekend.  - continue q1 neuro checks - recommend MRI brain without contrast for prognostic  purposes. - remainder of management per recommendations of critical care.  Cooper Render  Department of Neurosurgery

## 2021-05-25 NOTE — Progress Notes (Signed)
Initial Nutrition Assessment  DOCUMENTATION CODES:   Obesity unspecified  INTERVENTION:   If NGT placed and tube feeds initiated, recommend:  Osmolite 1.2 @ 28m/hr- Initiate at 49mhr and increase by 1027mr q 8 hours until goal rate is reached.   Free water flushes 69m71m hours   Regimen provides 2016kcal/day, 93g/day protein and 1678ml39m of free water   Pt at high refeed risk; recommend monitor potassium, magnesium and phosphorus labs daily until stable  NUTRITION DIAGNOSIS:   Inadequate oral intake related to acute illness as evidenced by NPO status.  GOAL:   Patient will meet greater than or equal to 90% of their needs  MONITOR:   Diet advancement, Labs, Weight trends, Skin, I & O's  REASON FOR ASSESSMENT:   Consult Assessment of nutrition requirement/status  ASSESSMENT:   65 y/75female with h/o HTN, asthma, breast cancer, CVA and subarachnoid hemorrhage s/p shunt placement for management of hydrocephalus > 10 years ago who is now admitted with shunt malfunction, sepsis and AMS now s/p left parietal approach ventriculostomy 10/11  Met with pt in room today. Pt lethargic and unable to provide any nutrition related history. Pt extubated 10/11 and remains NPO since admission r/t AMS. Spoke with MD, recommended placement of nasogastric tube and nutrition support. Pt is at high refeed risk. Pt may require transfer to tertiary hospital.    There is no documented weight history in chart to confirm if any significant recent weight changes pta. Pt is down 8lbs(4%) since admission.   Medications reviewed and include: lovenox, protonix, thiamine, ceftriaxone, 5% dextrose @75ml /hr  Labs reviewed: Na 151(H), K 4.2 wnl, BUN 46(H), creat 1.19(H), K 2.8 wnl, Mg 3.1(H) Wbc- 17.2(H) Cbgs- 101, 111 x 24 hrs AIC 5.8(H)- 10/11  NUTRITION - FOCUSED PHYSICAL EXAM:  Flowsheet Row Most Recent Value  Orbital Region No depletion  Upper Arm Region No depletion  Thoracic and Lumbar  Region No depletion  Buccal Region No depletion  Temple Region No depletion  Clavicle Bone Region No depletion  Clavicle and Acromion Bone Region No depletion  Scapular Bone Region No depletion  Dorsal Hand No depletion  Patellar Region No depletion  Anterior Thigh Region No depletion  Posterior Calf Region No depletion  Edema (RD Assessment) None  Hair Reviewed  Eyes Reviewed  Mouth Reviewed  Skin Reviewed  Nails Reviewed   Diet Order:   Diet Order             Diet NPO time specified  Diet effective now                  EDUCATION NEEDS:   No education needs have been identified at this time  Skin:  Skin Assessment: Reviewed RN Assessment (DTPI to the right ischial tuberosity 4 cm x 5 cm, incision head, DPI to back of neck)  Last BM:  10/14- type 6  Height:   Ht Readings from Last 1 Encounters:  05/21/21 5' 2"  (1.575 m)    Weight:   Wt Readings from Last 1 Encounters:  05/25/21 85 kg    Ideal Body Weight:  50 kg  BMI:  Body mass index is 34.27 kg/m.  Estimated Nutritional Needs:   Kcal:  1800-2100kcal/day  Protein:  90-105g/day  Fluid:  1.5-1.8L/day  CaseyKoleen DistanceRD, LDN Please refer to AMIONOtt[facility] D[facility] CenterRD and/or RD on-call/weekend/after hours pager

## 2021-05-25 NOTE — Progress Notes (Signed)
[place] INFECTIOUS DISEASE PROGRESS NOTE Date of Admission:  05/21/2021     ID: Desiree Mitchell is a 65 y.o. female with  bacteremia Principal Problem:   Sepsis ([place]) Active Problems:   Endotracheally intubated   On mechanically assisted ventilation (HCC)   Shunt malfunction   Rhabdomyolysis   AKI (acute kidney injury) ([place])   Increased anion gap metabolic acidosis   Acute encephalopathy   Transaminitis   Pressure injury of skin   Subjective: No fevers, wbc 17. A little more alert. MRI done  ROS unable to obtain  Medications:  Antibiotics Given (last 72 hours)     Date/Time Action Medication Dose Rate   05/22/21 2030 New Bag/Given   ceFEPIme (MAXIPIME) 2 g in sodium chloride 0.9 % 100 mL IVPB 2 g 200 mL/hr   05/23/21 1101 New Bag/Given   ceFEPIme (MAXIPIME) 2 g in sodium chloride 0.9 % 100 mL IVPB 2 g 200 mL/hr   05/23/21 2105 New Bag/Given   ceFEPIme (MAXIPIME) 2 g in sodium chloride 0.9 % 100 mL IVPB 2 g 200 mL/hr   05/24/21 1009 New Bag/Given   cefTRIAXone (ROCEPHIN) 2 g in sodium chloride 0.9 % 100 mL IVPB 2 g 200 mL/hr   05/24/21 1731 New Bag/Given   linezolid (ZYVOX) IVPB 600 mg 600 mg 300 mL/hr   05/25/21 1007 New Bag/Given   cefTRIAXone (ROCEPHIN) 2 g in sodium chloride 0.9 % 100 mL IVPB 2 g 200 mL/hr   05/25/21 1115 New Bag/Given   linezolid (ZYVOX) IVPB 600 mg 600 mg 300 mL/hr       budesonide (PULMICORT) nebulizer solution  0.5 mg Nebulization BID   Chlorhexidine Gluconate Cloth  6 each Topical Daily   collagenase   Topical Daily   enoxaparin (LOVENOX) injection  40 mg Subcutaneous Q24H   Gerhardt's butt cream   Topical QID   ipratropium-albuterol  3 mL Nebulization Q6H   pantoprazole (PROTONIX) IV  40 mg Intravenous QHS   sodium chloride flush  10-40 mL Intracatheter Q12H   thiamine injection  100 mg Intravenous Daily    Objective: Vital signs in last 24 hours: Temp:  [98.1 F (36.7 C)-99.1 F (37.3 C)] 98.1 F (36.7 C) (10/14 1200) Pulse  Rate:  [83-117] 94 (10/14 1200) Resp:  [13-27] 14 (10/14 1200) BP: (139-184)/(82-120) 179/109 (10/14 1200) SpO2:  [93 %-100 %] 100 % (10/14 1200) FiO2 (%):  [35 %] 35 % (10/14 0000) Weight:  [85 kg] 85 kg (10/14 0330) Constitutional:  ill appearing, slowed mentation but eyes open and able to talk slowly and answer simple questions HENT:  no scleral icterus Mouth/Throat: Oropharynx is dry Scalp with EVD in place.  Neck with large area of skin breakdown with rednes.  Cardiovascular: Normal rate, regular rhythm and normal heart sounds. .  Portacath in R chest wall is accessed- no redness Pulmonary/Chest: rhonchi bil   Neck = supple, no nuchal rigidity Abdominal: Soft. Bowel sounds are normal.  exhibits no distension. There is no tenderness.  Lymphadenopathy: no cervical adenopathy. No axillary adenopathy Neurological: more alert today today but slowed mentatio Skin: see photos, multiple ares of skin bruising and skin breakdown Psychiatric: min responsive  Lab Results Recent Labs    05/24/21 0358 05/24/21 0403 05/24/21 1446 05/25/21 0517  WBC  --  18.2*  --  17.2*  HGB  --  13.3 13.2 13.6  HCT  --  39.2 39.2 40.4  NA 148*  --   --  151*  K 4.4  --   --  4.2  CL 114*  --   --  118*  CO2 26  --   --  25  BUN 60*  --   --  46*  CREATININE 1.38*  --   --  1.19*    Microbiology: Results for orders placed or performed during the hospital encounter of 05/21/21  Blood culture (routine x 2)     Status: Abnormal   Collection Time: 05/21/21  7:05 PM   Specimen: BLOOD  Result Value Ref Range Status   Specimen Description   Final    BLOOD LEFT ANTECUBITAL Performed at [facility], [address], [place], [place] [address]    Special Requests   Final    BOTTLES DRAWN AEROBIC AND ANAEROBIC Blood Culture adequate volume Performed at [facility], [address] [place], [place] [address]    Culture  Setup Time   Final    IN BOTH AEROBIC AND ANAEROBIC  BOTTLES GRAM POSITIVE COCCI GRAM NEGATIVE RODS Organism ID to follow CRITICAL RESULT CALLED TO, READ BACK BY AND VERIFIED WITH: CARISSA [facility] AT 2054 05/22/21 BY [facility] Performed at [facility], [address], [place], [place] [address]    Culture (A)  Final    PROTEUS PENNERI STAPHYLOCOCCUS EPIDERMIDIS THE SIGNIFICANCE OF ISOLATING THIS ORGANISM FROM A SINGLE SET OF BLOOD CULTURES WHEN MULTIPLE SETS ARE DRAWN IS UNCERTAIN. PLEASE NOTIFY THE MICROBIOLOGY DEPARTMENT WITHIN ONE WEEK IF SPECIATION AND SENSITIVITIES ARE REQUIRED. Performed at [facility], [place] [address], [place], [place] [address]    Report Status 05/25/2021 FINAL  Final   Organism ID, Bacteria PROTEUS PENNERI  Final      Susceptibility   Proteus penneri - MIC*    AMPICILLIN >=32 RESISTANT Resistant     CEFAZOLIN >=64 RESISTANT Resistant     CEFEPIME <=0.12 SENSITIVE Sensitive     CEFTAZIDIME <=1 SENSITIVE Sensitive     CEFTRIAXONE <=0.25 SENSITIVE Sensitive     CIPROFLOXACIN <=0.25 SENSITIVE Sensitive     GENTAMICIN <=1 SENSITIVE Sensitive     IMIPENEM 2 SENSITIVE Sensitive     TRIMETH/SULFA <=20 SENSITIVE Sensitive     AMPICILLIN/SULBACTAM 16 INTERMEDIATE Intermediate     PIP/TAZO <=4 SENSITIVE Sensitive     * PROTEUS PENNERI  Blood Culture ID Panel (Reflexed)     Status: Abnormal   Collection Time: 05/21/21  7:05 PM  Result Value Ref Range Status   Enterococcus faecalis NOT DETECTED NOT DETECTED Final   Enterococcus Faecium NOT DETECTED NOT DETECTED Final   Listeria monocytogenes NOT DETECTED NOT DETECTED Final   Staphylococcus species DETECTED (A) NOT DETECTED Final    Comment: READ BACK AND VERIFIED BY CARISSA DOLAN AT 2054 05/22/21 BY [facility]   Staphylococcus aureus (BCID) NOT DETECTED NOT DETECTED Final   Staphylococcus epidermidis DETECTED (A) NOT DETECTED Final    Comment: READ BACK AND VERIFIED BY CARRISA DOLAN, AT 2045 05/22/21 BY [facility]   Staphylococcus lugdunensis NOT DETECTED NOT DETECTED  Final   Streptococcus species NOT DETECTED NOT DETECTED Final   Streptococcus agalactiae NOT DETECTED NOT DETECTED Final   Streptococcus pneumoniae NOT DETECTED NOT DETECTED Final   Streptococcus pyogenes NOT DETECTED NOT DETECTED Final   A.calcoaceticus-baumannii NOT DETECTED NOT DETECTED Final   Bacteroides fragilis NOT DETECTED NOT DETECTED Final   Enterobacterales NOT DETECTED NOT DETECTED Final   Enterobacter cloacae complex NOT DETECTED NOT DETECTED Final   Escherichia coli NOT DETECTED NOT DETECTED Final   Klebsiella aerogenes NOT DETECTED NOT DETECTED Final  Klebsiella oxytoca NOT DETECTED NOT DETECTED Final   Klebsiella pneumoniae NOT DETECTED NOT DETECTED Final   Proteus species NOT DETECTED NOT DETECTED Final   Salmonella species NOT DETECTED NOT DETECTED Final   Serratia marcescens NOT DETECTED NOT DETECTED Final   Haemophilus influenzae NOT DETECTED NOT DETECTED Final   Neisseria meningitidis NOT DETECTED NOT DETECTED Final   Pseudomonas aeruginosa NOT DETECTED NOT DETECTED Final   Stenotrophomonas maltophilia NOT DETECTED NOT DETECTED Final   Candida albicans NOT DETECTED NOT DETECTED Final   Candida auris NOT DETECTED NOT DETECTED Final   Candida glabrata NOT DETECTED NOT DETECTED Final   Candida krusei NOT DETECTED NOT DETECTED Final   Candida parapsilosis NOT DETECTED NOT DETECTED Final   Candida tropicalis NOT DETECTED NOT DETECTED Final   Cryptococcus neoformans/gattii NOT DETECTED NOT DETECTED Final   Methicillin resistance mecA/C NOT DETECTED NOT DETECTED Final    Comment: Performed at [facility], [address] [place], [place] [address]  Blood Culture ID Panel (Reflexed)     Status: Abnormal   Collection Time: 05/21/21  7:05 PM  Result Value Ref Range Status   Enterococcus faecalis NOT DETECTED NOT DETECTED Final   Enterococcus Faecium NOT DETECTED NOT DETECTED Final   Listeria monocytogenes NOT DETECTED NOT DETECTED Final   Staphylococcus  species DETECTED (A) NOT DETECTED Final    Comment: CRITICAL RESULT CALLED TO, READ BACK BY AND VERIFIED WITH: JASON ROBBINS PHARMD 0522 05/23/21 HNM    Staphylococcus aureus (BCID) NOT DETECTED NOT DETECTED Final   Staphylococcus epidermidis NOT DETECTED NOT DETECTED Final   Staphylococcus lugdunensis NOT DETECTED NOT DETECTED Final   Streptococcus species NOT DETECTED NOT DETECTED Final   Streptococcus agalactiae NOT DETECTED NOT DETECTED Final   Streptococcus pneumoniae NOT DETECTED NOT DETECTED Final   Streptococcus pyogenes NOT DETECTED NOT DETECTED Final   A.calcoaceticus-baumannii NOT DETECTED NOT DETECTED Final   Bacteroides fragilis NOT DETECTED NOT DETECTED Final   Enterobacterales DETECTED (A) NOT DETECTED Final    Comment: Enterobacterales represent a large order of gram negative bacteria, not a single organism. CRITICAL RESULT CALLED TO, READ BACK BY AND VERIFIED WITH: JASON ROBBINS PHARMD 0522 05/23/21 HNM    Enterobacter cloacae complex NOT DETECTED NOT DETECTED Final   Escherichia coli NOT DETECTED NOT DETECTED Final   Klebsiella aerogenes NOT DETECTED NOT DETECTED Final   Klebsiella oxytoca NOT DETECTED NOT DETECTED Final   Klebsiella pneumoniae NOT DETECTED NOT DETECTED Final   Proteus species DETECTED (A) NOT DETECTED Final    Comment: CRITICAL RESULT CALLED TO, READ BACK BY AND VERIFIED WITH: JASON ROBBINS PHARMD 0522 05/23/21 HNM    Salmonella species NOT DETECTED NOT DETECTED Final   Serratia marcescens NOT DETECTED NOT DETECTED Final   Haemophilus influenzae NOT DETECTED NOT DETECTED Final   Neisseria meningitidis NOT DETECTED NOT DETECTED Final   Pseudomonas aeruginosa NOT DETECTED NOT DETECTED Final   Stenotrophomonas maltophilia NOT DETECTED NOT DETECTED Final   Candida albicans NOT DETECTED NOT DETECTED Final   Candida auris NOT DETECTED NOT DETECTED Final   Candida glabrata NOT DETECTED NOT DETECTED Final   Candida krusei NOT DETECTED NOT DETECTED Final    Candida parapsilosis NOT DETECTED NOT DETECTED Final   Candida tropicalis NOT DETECTED NOT DETECTED Final   Cryptococcus neoformans/gattii NOT DETECTED NOT DETECTED Final   CTX-M ESBL NOT DETECTED NOT DETECTED Final   Carbapenem resistance IMP NOT DETECTED NOT DETECTED Final   Carbapenem resistance KPC NOT DETECTED NOT DETECTED Final  Carbapenem resistance NDM NOT DETECTED NOT DETECTED Final   Carbapenem resist OXA 48 LIKE NOT DETECTED NOT DETECTED Final   Carbapenem resistance VIM NOT DETECTED NOT DETECTED Final    Comment: Performed at [facility], [address] [place], [place] [address]  Resp Panel by RT-PCR (Flu A&B, Covid)     Status: None   Collection Time: 05/21/21  9:46 PM   Specimen: Nasopharyngeal(NP) swabs in vial transport medium  Result Value Ref Range Status   SARS Coronavirus 2 by RT PCR NEGATIVE NEGATIVE Final    Comment: (NOTE) SARS-CoV-2 target nucleic acids are NOT DETECTED.  The SARS-CoV-2 RNA is generally detectable in upper respiratory specimens during the acute phase of infection. The lowest concentration of SARS-CoV-2 viral copies this assay can detect is 138 copies/mL. A negative result does not preclude SARS-Cov-2 infection and should not be used as the sole basis for treatment or other patient management decisions. A negative result may occur with  improper specimen collection/handling, submission of specimen other than nasopharyngeal swab, presence of viral mutation(s) within the areas targeted by this assay, and inadequate number of viral copies(<138 copies/mL). A negative result must be combined with clinical observations, patient history, and epidemiological information. The expected result is Negative.  Fact Sheet for Patients:  EntrepreneurPulse.com.au  Fact Sheet for Healthcare Providers:  IncredibleEmployment.be  This test is no t yet approved or cleared by the [place] FDA and  has been  authorized for detection and/or diagnosis of SARS-CoV-2 by FDA under an Emergency Use Authorization (EUA). This EUA will remain  in effect (meaning this test can be used) for the duration of the COVID-19 declaration under Section 564(b)(1) of the Act, 21 U.S.C.section 360bbb-3(b)(1), unless the authorization is terminated  or revoked sooner.       Influenza A by PCR NEGATIVE NEGATIVE Final   Influenza B by PCR NEGATIVE NEGATIVE Final    Comment: (NOTE) The Xpert Xpress SARS-CoV-2/FLU/RSV plus assay is intended as an aid in the diagnosis of influenza from Nasopharyngeal swab specimens and should not be used as a sole basis for treatment. Nasal washings and aspirates are unacceptable for Xpert Xpress SARS-CoV-2/FLU/RSV testing.  Fact Sheet for Patients: EntrepreneurPulse.com.au  Fact Sheet for Healthcare Providers: IncredibleEmployment.be  This test is not yet approved or cleared by the [place] FDA and has been authorized for detection and/or diagnosis of SARS-CoV-2 by FDA under an Emergency Use Authorization (EUA). This EUA will remain in effect (meaning this test can be used) for the duration of the COVID-19 declaration under Section 564(b)(1) of the Act, 21 U.S.C. section 360bbb-3(b)(1), unless the authorization is terminated or revoked.  Performed at [facility], [address] [place], [place] [address]   Aerobic/Anaerobic Culture w Gram Stain (surgical/deep wound)     Status: None (Preliminary result)   Collection Time: 05/22/21  1:24 AM   Specimen: CSF; Body Fluid  Result Value Ref Range Status   Specimen Description   Final    CSF Performed at [facility], [address] [place], [place] [address]    Special Requests   Final    NONE Performed at [facility], [place], [place] [address]    Gram Stain NO WBC SEEN NO ORGANISMS SEEN   Final   Culture   Final    NO GROWTH 3  DAYS NO ANAEROBES ISOLATED; CULTURE IN PROGRESS FOR 5 DAYS Performed at [facility], [address] [place],  [address]  Report Status PENDING  Incomplete  Culture, blood (Routine X 2) w Reflex to ID Panel     Status: None (Preliminary result)   Collection Time: 05/22/21  3:02 AM   Specimen: BLOOD  Result Value Ref Range Status   Specimen Description BLOOD RIGHT HAND  Final   Special Requests   Final    BOTTLES DRAWN AEROBIC ONLY Blood Culture results may not be optimal due to an inadequate volume of blood received in culture bottles   Culture   Final    NO GROWTH 3 DAYS Performed at [facility], [address], [place], [place] [address]    Report Status PENDING  Incomplete    Studies/Results: CT HEAD WO CONTRAST (5MM)  Result Date: 05/24/2021 CLINICAL DATA:  Hydrocephalus EXAM: CT HEAD WITHOUT CONTRAST TECHNIQUE: Contiguous axial images were obtained from the base of the skull through the vertex without intravenous contrast. COMPARISON:  05/23/2021 FINDINGS: Brain: Left parietal approach ventriculostomy catheter terminates in the inferior right frontal lobe, unchanged. Overall unchanged size and configuration of ventricular system compared to 05/23/2021 and 05/21/2021. No acute infarction, hemorrhage, extra-axial collection, mass, mass effect, or midline shift. Periventricular white matter hypodensity, unchanged, which may represent transependymal flow of CSF and/or sequela of chronic small vessel ischemic disease. Vascular: No hyperdense vessel. Skull: Right frontal and left parietal burr holes. No acute fracture. Sinuses/Orbits: Negative. Other: The mastoids are well aerated. IMPRESSION: Unchanged size and configuration of the ventricular system, with unchanged position of the left parietal approach ventriculostomy catheter. Unchanged periventricular hypodensity, which may represent transependymal flow CSF and/or chronic small vessel ischemic disease.  Electronically Signed   By: Merilyn Baba M.D.   On: 05/24/2021 11:52   DG Chest Port 1 View  Result Date: 05/24/2021 CLINICAL DATA:  Hypoxia EXAM: PORTABLE CHEST 1 VIEW COMPARISON:  05/22/2021 FINDINGS: Endotracheal tube and NG tube removed. Port-A-Cath tip in the SVC unchanged. Cardiac enlargement.  Negative for heart failure or edema Progression of left lower lobe consolidation. Mild improvement in right lower lobe atelectasis. No significant effusion. IMPRESSION: Endotracheal tube removed. Progression of left lower lobe atelectasis with improvement in right lower lobe atelectasis. Electronically Signed   By: Franchot Gallo M.D.   On: 05/24/2021 10:50    Assessment/Plan: Temima Kutsch is a 65 y.o. 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 She had extensive skin bruising and also a wound on post neck where her head had rested against a basket. Also has multiple skin lesions and was apparently infested with bedbugs and cockroaches on admission.  She was found to have malfunctioning of her VA shunt with the distal end coiled in the neck.  She underwent removal of her VA shunt as well as placement of external ventricular drain.  She was found to have bacteremia with 1 set of cultures growing Proteus as well as staph species and the set of blood cultures other growing staph epidermidis.  She does have a Port-A-Cath in place.   I suspect her bacteremia is from her skin on her legs as well as newer lesions on her being down especially infected appearing wound on the posterior neck.  No evidence that her VP shunt was infected apparently.  Cultures are negative.  She has had CT of her chest abdomen and pelvis.  Only finding was some mild atelectasis. She has been on cefepime and has received 1 dose of Vanco Follow-up blood cultures 10/11 no  growth to date COVID.   10/14- no fevers,   Recommendations Cont linezolid x 14 days  planned course for GP coverage given skin and soft tissue infection. This should also cover the staph epi Cont ceftriaxone.  Continue wound care. Hopefully her Port-A-Cath has not been infected and will not need to be removed but we will have to continue to monitor this.  We can hopefully treat her with IV antibiotics.  Thank you very much for the consult. Will follow with you.  Leonel Ramsay   05/25/2021, 2:14 PM

## 2021-05-25 NOTE — Progress Notes (Addendum)
NAME:  Desiree Mitchell, MRN:  [id number], DOB:  [birth year], LOS: 3 ADMISSION DATE:  05/21/2021  65 year old female presented at [facility] ED on 05/21/2021 from home via EMS after being found unresponsive by family.  Patient was last seen normal on Saturday, 05/19/2021 at 4 PM.  On EMS arrival patient was unresponsive and hypoxic with SPO2 in the 70s and a normal blood sugar, EMS had difficulty getting a blood pressure.  An IO was placed in the right humeral head and the patient received 300 mL of LR, 40 mg of lidocaine and 25 mg of Phenergan.  Family later reported to EDP that they found her sitting on a toilet slumped with her neck up against a basket. Of note patient arrived infested with bed bugs and roaches.  Significant labs: On admission:  Chemistry: Na+: 137, K+: 4.8, Cl: 97, BUN/Cr.:  84/2.81, Serum CO2/ AG: 17/23, AST: 155, ALT: 59, CK: 6755 Hematology: WBC: 30.3, Hgb: 15.9, Troponin: 22, Lactic/ PCT: 2.4 > 2.2/ pending, COVID-19 & Influenza A/B: negative VBG: 7.21/53/32/21.2 CXR 05/21/2021: Mild left basilar atelectasis without evidence of an acute infiltrate CT head without contrast 05/21/2021: Interval development of mild hydrocephalus and periventricular edema, associated malpositioned left VA shunt that terminates in the anterior mediastinum and no longer within the left brachiocephalic vein.  Shunt noted to be coiled within the neck.  Groundglass opacity within the right lower lobe and patchy airspace opacity within the left lower lobe. CT cervical spine 05/21/2021: No acute fracture or traumatic injury noted.  Interval development of enlarged right thyroid gland.  Significant Hospital Events: Including procedures, antibiotic start and stop dates in addition to other pertinent events         10/10 admitted or severe sepsis from infected Vp shunt 10/11    1. Removal of complete ventriculoatrial shunt              2. Placement of left parietal approach ventriculostomy 10/11 extubated  successfully 10/12 SD status 10/13 remains ICU status due to neuro checks 10/14 remains in ICU due to need for neurochecks and external ventricular drain  Micro Data:  COVID NEG HIV NEG   Microbiology results: 10/10 BCID - proteus spp in 1of4 & staph spp in 2of4 (no resistances detected),  BCID 1of4 MSSE 10/10 Bcx: GPCs & GNRs (pending speciation) 10/11 BCx:  10/11: CSF-NGTD   10/10: FLU/COV - negative  Antimicrobials:   Antibiotics Given (last 72 hours)     Date/Time Action Medication Dose Rate   05/22/21 2030 New Bag/Given   ceFEPIme (MAXIPIME) 2 g in sodium chloride 0.9 % 100 mL IVPB 2 g 200 mL/hr   05/23/21 1101 New Bag/Given   ceFEPIme (MAXIPIME) 2 g in sodium chloride 0.9 % 100 mL IVPB 2 g 200 mL/hr   05/23/21 2105 New Bag/Given   ceFEPIme (MAXIPIME) 2 g in sodium chloride 0.9 % 100 mL IVPB 2 g 200 mL/hr   05/24/21 1009 New Bag/Given   cefTRIAXone (ROCEPHIN) 2 g in sodium chloride 0.9 % 100 mL IVPB 2 g 200 mL/hr   05/24/21 1731 New Bag/Given   linezolid (ZYVOX) IVPB 600 mg 600 mg 300 mL/hr      Scheduled Meds:  budesonide (PULMICORT) nebulizer solution  0.5 mg Nebulization BID   Chlorhexidine Gluconate Cloth  6 each Topical Daily   collagenase   Topical Daily   enoxaparin (LOVENOX) injection  40 mg Subcutaneous Q24H   Gerhardt's butt cream   Topical QID   ipratropium-albuterol  3 mL Nebulization  Q6H   pantoprazole (PROTONIX) IV  40 mg Intravenous QHS   sodium chloride flush  10-40 mL Intracatheter Q12H   thiamine injection  100 mg Intravenous Daily   Continuous Infusions:  cefTRIAXone (ROCEPHIN)  IV 2 g (05/25/21 1007)   dextrose 75 mL/hr at 05/25/21 1017   linezolid (ZYVOX) IV Stopped (05/24/21 1831)   PRN Meds:.albuterol, docusate sodium, polyethylene glycol, sodium chloride flush    Interim History / Subjective:  Remains lethargic EVD (drain in place) Elevated BP today continue hydralazine High risk for aspiration Remains critically ill ? vaginal  bleeding noted yesterday   Objective   Blood pressure (!) 161/102, pulse 94, temperature 98.6 F (37 C), resp. rate 17, height 5\' 2"  (1.575 m), weight 85 kg, SpO2 98 %.    FiO2 (%):  [35 %] 35 %   Intake/Output Summary (Last 24 hours) at 05/25/2021 0941 Last data filed at 05/25/2021 0800 Gross per 24 hour  Intake 508.67 ml  Output 963 ml  Net -454.33 ml    Filed Weights   05/23/21 0323 05/24/21 0317 05/25/21 0330  Weight: 88.3 kg 85.8 kg 85 kg   REVIEW OF SYSTEMS  PATIENT IS UNABLE TO PROVIDE COMPLETE REVIEW OF SYSTEMS DUE TO SEVERE CRITICAL ILLNESS AND  METABOLIC ENCEPHALOPATHY  Physical examination: General: Adult female, acute on chronically ill during, lying in bed, lethargic delayed responses with few words. HEENT: MM pink/moist, anicteric, atraumatic, neck supple Neuro: Lethargic, delayed responses, cannot assess further, PERRL +2, spontaneous cough   CV: s1s2 RRR, NSR on monitor, no r/m/g Pulm: Good air entry bilaterally, scattered rhonchi throughout.  No wheezes GI: soft, rounded, bs x 4 GU: foley in place with clear urine Skin: PTA pressure injury posterior neck, scattered bug bites and excoriation on extremities > multiple ecchymotic areas  Extremities: warm/dry, pulses + 2 R/P, trace edema noted BLE, lymphedema left upper extremity    Pressure Injury 05/22/21 Buttocks Right Deep Tissue Pressure Injury - Purple or maroon localized area of discolored intact skin or blood-filled blister due to damage of underlying soft tissue from pressure and/or shear. (Active)  05/22/21 0300  Location: Buttocks  Location Orientation: Right  Staging: Deep Tissue Pressure Injury - Purple or maroon localized area of discolored intact skin or blood-filled blister due to damage of underlying soft tissue from pressure and/or shear.  Wound Description (Comments):   Present on Admission: Yes     Pressure Injury 05/22/21 Coccyx Stage 2 -  Partial thickness loss of dermis presenting as a  shallow open injury with a red, pink wound bed without slough. (Active)  05/22/21 0300  Location: Coccyx  Location Orientation:   Staging: Stage 2 -  Partial thickness loss of dermis presenting as a shallow open injury with a red, pink wound bed without slough.  Wound Description (Comments):   Present on Admission: Yes     Pressure Injury 05/22/21 Cervical Deep Tissue Pressure Injury - Purple or maroon localized area of discolored intact skin or blood-filled blister due to damage of underlying soft tissue from pressure and/or shear. (Active)  05/22/21 0300  Location: Cervical  Location Orientation:   Staging: Deep Tissue Pressure Injury - Purple or maroon localized area of discolored intact skin or blood-filled blister due to damage of underlying soft tissue from pressure and/or shear.  Wound Description (Comments):   Present on Admission: Yes    Labs/imaging that I havepersonally reviewed  (right click and "Reselect all SmartList Selections" daily)   SYNOPSIS 65 yo admitted for sepsis  infected VP shunt s/p post resp failure resolved with EVD in place, renal failure improving   Assessment & Plan:  Acute Respiratory Failure in the postoperative setting after neurosurgical VA shunt removal with EVD placement  hx: "Bronchial  asthma" -Extubated 10/11 -Wean O2 for saturations of 92% or better. -Poor cough mechanics -Aggressive pulmonary toilet -Continue bronchodilators  Rhabdomyolysis secondary to hypovolemia in the setting of dehydration Increased Anion Gap Metabolic Acidosis - resolved Hypernatremia, hyperchloremia Acute Kidney Injury  Baseline Cr: 0.9, Cr on admission: 2.81, now 1.19 CK: 6,755 >> 5,609 - Strict I/O's: alert provider if UOP < 0.5 mL/kg/hr - Hypernatremia and hyperchloremia due to dehydration, increase free water - IVF hydration: We will have to switch to D5W due to hypernatremia and hyperchloremia - U0 not suggestive of DI continue to monitor - Recheck CPK -  Daily BMP, replace electrolytes PRN - Avoid nephrotoxic agents as able, ensure adequate renal perfusion  Severe sepsis without shock due to infected VA shunt in the setting of shock malfunction - Continue supportive care - Has not required pressors - Continue antibiotics  VA Shunt Malfunction Acute encephalopathy Patient returned postop with EVD in place.   - Dr. Cari Caraway went through EVD orders with nursing bedside: monitor drain output every 2 hours. Call if non-functional.  Tracheostomy height adjusted per neurosurgery - Patient will need definitive shunt, query transfer to [facility] - Frequent neuro checks - Add thiamine, correct electrolyte - Management by neurosurgery appreciate input   Transaminitis secondary to hypovolemia in the setting of rhabdomyolysis - Trend hepatic function - Consider RUQ [place], if no improvement post IV fluid resuscitation - Avoid hepatotoxic agents - Check hepatic panel today.   Posterior Neck, Buttocks & Coccyx Pressure Injuries secondary to being found down at home for unknown amount of time - Q 2 h turns and repositioning as EVD allows - ROM Q 4 h - WOC consult  Nutrition Status: Nutrition Problem: Inadequate oral intake Etiology: inability to eat (pt sedated and ventilated) Signs/Symptoms: NPO status    Best Practice (right click and "Reselect all SmartList Selections" daily)  Diet/type: NPO w/ meds IV, high risk for aspiration, may need Dobbhoff DVT prophylaxis: SCD GI prophylaxis: PPI Lines: Central line Foley:  Yes, and it is still needed Code Status:  full code  Labs   CBC: Recent Labs  Lab 05/21/21 1905 05/22/21 0342 05/23/21 0430 05/24/21 0403 05/24/21 1446 05/25/21 0517  WBC 30.3* 21.9* 19.0* 18.2*  --  17.2*  NEUTROABS 27.1*  --  16.4*  --   --   --   HGB 15.9* 13.4 13.4 13.3 13.2 13.6  HCT 49.0* 38.9 39.5 39.2 39.2 40.4  MCV 96.3 95.3 96.1 99.2  --  100.0  PLT 491* 353 401* 366  --  379     Basic Metabolic  Panel: Recent Labs  Lab 05/21/21 1905 05/22/21 0342 05/23/21 0430 05/24/21 0358 05/25/21 0517  NA 137 140 144 148* 151*  K 4.8 3.8 4.2 4.4 4.2  CL 97* 109 111 114* 118*  CO2 17* 18* 22 26 25   GLUCOSE 129* 93 108* 117* 118*  BUN 84* 88* 76* 60* 46*  CREATININE 2.81* 2.60* 1.81* 1.38* 1.19*  CALCIUM 8.8* 7.3* 8.1* 8.1* 8.1*  MG  --  2.4 2.8* 3.1* 3.1*  PHOS  --  7.8* 3.8 2.3* 2.8    GFR: Estimated Creatinine Clearance: 49 mL/min (A) (by C-G formula based on SCr of 1.19 mg/dL (H)). Recent Labs  Lab 05/21/21 2146 05/21/21 2344  05/22/21 0323 05/22/21 0342 05/23/21 0430 05/24/21 0358 05/24/21 0403 05/25/21 0517  PROCALCITON  --   --  1.28  --  1.11 0.47  --   --   WBC  --   --   --  21.9* 19.0*  --  18.2* 17.2*  LATICACIDVEN 2.4* 2.2*  --  1.5  --   --   --   --      Liver Function Tests: Recent Labs  Lab 05/21/21 1905  AST 155*  ALT 59*  ALKPHOS 76  BILITOT 1.1  PROT 7.6  ALBUMIN 3.6    No results for input(s): LIPASE, AMYLASE in the last 168 hours. Recent Labs  Lab 05/21/21 1905  AMMONIA 29     ABG    Component Value Date/Time   PHART 7.45 05/23/2021 0940   PCO2ART 32 05/23/2021 0940   PO2ART 80 (L) 05/23/2021 0940   HCO3 22.2 05/23/2021 0940   ACIDBASEDEF 1.0 05/23/2021 0940   O2SAT 96.3 05/23/2021 0940      Coagulation Profile: No results for input(s): INR, PROTIME in the last 168 hours.  Cardiac Enzymes: Recent Labs  Lab 05/21/21 1905 05/22/21 0342  CKTOTAL 6,755* 5,609*     HbA1C: Hgb A1c MFr Bld  Date/Time Value Ref Range Status  05/22/2021 05:20 AM 5.8 (H) 4.8 - 5.6 % Final    Comment:    (NOTE)         Prediabetes: 5.7 - 6.4         Diabetes: >6.4         Glycemic control for adults with diabetes: <7.0     CBG: Recent Labs  Lab 05/24/21 1612 05/24/21 1914 05/24/21 2318 05/25/21 0314 05/25/21 0727  GLUCAP 102* 137* 111* 101* 111*     Allergies No Known Allergies    Patient was discussed during  multidisciplinary rounds with ICU team.  Patient will need definitive neurosurgical treatment and may need transfer to [facility].   Ultimate disposition the patient will need likely skilled nursing facility.  Transition of care team has been consulted.   The patient is critically ill with multiple organ systems failure and requires high complexity decision making for assessment and support, frequent evaluation and titration of therapies, application of advanced monitoring technologies and extensive interpretation of multiple databases. Critical Care Time devoted to patient care services described in this note is 40 minutes.   Renold Don, MD Advanced Bronchoscopy PCCM [facility]    *This note was dictated using voice recognition software/Dragon.  Despite best efforts to proofread, errors can occur which can change the meaning.  Any change was purely unintentional.

## 2021-05-26 ENCOUNTER — Inpatient Hospital Stay: Payer: Medicare (Managed Care)

## 2021-05-26 ENCOUNTER — Inpatient Hospital Stay
Admit: 2021-05-26 | Discharge: 2021-05-26 | Disposition: A | Discharge status: Home / Self Care | Payer: Medicare (Managed Care) | Attending: Pulmonary Disease | Admitting: Pulmonary Disease

## 2021-05-26 DIAGNOSIS — A4150 Gram-negative sepsis, unspecified: Secondary | ICD-10-CM

## 2021-05-26 DIAGNOSIS — T85618D Breakdown (mechanical) of other specified internal prosthetic devices, implants and grafts, subsequent encounter: Secondary | ICD-10-CM | POA: Diagnosis not present

## 2021-05-26 DIAGNOSIS — R7401 Elevation of levels of liver transaminase levels: Secondary | ICD-10-CM | POA: Diagnosis not present

## 2021-05-26 DIAGNOSIS — N179 Acute kidney failure, unspecified: Secondary | ICD-10-CM | POA: Diagnosis not present

## 2021-05-26 LAB — LIPID PANEL
Cholesterol: 128 mg/dL (ref 0–200)
HDL: 39 mg/dL — ABNORMAL LOW (ref >40)
LDL Cholesterol: 63 mg/dL (ref 0–99)
Total CHOL/HDL Ratio: 3.3 RATIO
Triglycerides: 131 mg/dL (ref <150)
VLDL: 26 mg/dL (ref 0–40)

## 2021-05-26 LAB — BASIC METABOLIC PANEL
Anion gap: 7 (ref 5–15)
BUN: 31 mg/dL — ABNORMAL HIGH (ref 8–23)
CO2: 27 mmol/L (ref 22–32)
Calcium: 7.4 mg/dL — ABNORMAL LOW (ref 8.9–10.3)
Chloride: 112 mmol/L — ABNORMAL HIGH (ref 98–111)
Creatinine, Ser: 1.05 mg/dL — ABNORMAL HIGH (ref 0.44–1.00)
GFR, Estimated: 60 mL/min — ABNORMAL LOW (ref >60)
Glucose, Bld: 142 mg/dL — ABNORMAL HIGH (ref 70–99)
Potassium: 3.9 mmol/L (ref 3.5–5.1)
Sodium: 146 mmol/L — ABNORMAL HIGH (ref 135–145)

## 2021-05-26 LAB — CBC
HCT: 36.5 % (ref 36.0–46.0)
Hemoglobin: 11.8 g/dL — ABNORMAL LOW (ref 12.0–15.0)
MCH: 32.7 pg (ref 26.0–34.0)
MCHC: 32.3 g/dL (ref 30.0–36.0)
MCV: 101.1 fL — ABNORMAL HIGH (ref 80.0–100.0)
Platelets: 340 10*3/uL (ref 150–400)
RBC: 3.61 MIL/uL — ABNORMAL LOW (ref 3.87–5.11)
RDW: 14.5 % (ref 11.5–15.5)
WBC: 14.5 10*3/uL — ABNORMAL HIGH (ref 4.0–10.5)
nRBC: 0 % (ref 0.0–0.2)

## 2021-05-26 LAB — VITAMIN B12: Vitamin B-12: 214 pg/mL (ref 180–914)

## 2021-05-26 LAB — GLUCOSE, CAPILLARY
Glucose-Capillary: 108 mg/dL — ABNORMAL HIGH (ref 70–99)
Glucose-Capillary: 110 mg/dL — ABNORMAL HIGH (ref 70–99)
Glucose-Capillary: 119 mg/dL — ABNORMAL HIGH (ref 70–99)
Glucose-Capillary: 126 mg/dL — ABNORMAL HIGH (ref 70–99)
Glucose-Capillary: 132 mg/dL — ABNORMAL HIGH (ref 70–99)
Glucose-Capillary: 146 mg/dL — ABNORMAL HIGH (ref 70–99)

## 2021-05-26 LAB — SODIUM
Sodium: 140 mmol/L (ref 135–145)
Sodium: 142 mmol/L (ref 135–145)

## 2021-05-26 LAB — FOLATE: Folate: 5.8 ng/mL — ABNORMAL LOW (ref >5.9)

## 2021-05-26 LAB — MAGNESIUM: Magnesium: 3 mg/dL — ABNORMAL HIGH (ref 1.7–2.4)

## 2021-05-26 LAB — PHOSPHORUS: Phosphorus: 2.4 mg/dL — ABNORMAL LOW (ref 2.5–4.6)

## 2021-05-26 LAB — CK: Total CK: 254 U/L — ABNORMAL HIGH (ref 38–234)

## 2021-05-26 MED ORDER — POTASSIUM PHOSPHATES 15 MMOLE/5ML IV SOLN
10.0000 mmol | Freq: Once | INTRAVENOUS | Status: AC
  Administered 2021-05-26: 10 mmol via INTRAVENOUS
  Filled 2021-05-26: qty 3.33

## 2021-05-26 NOTE — Progress Notes (Signed)
NAME:  Desiree Mitchell, MRN:  [id number], DOB:  [birth year], LOS: 4 ADMISSION DATE:  05/21/2021  65 year old female presented at [facility] ED on 05/21/2021 from home via EMS after being found unresponsive by family.  Patient was last seen normal on Saturday, 05/19/2021 at 4 PM.  On EMS arrival patient was unresponsive and hypoxic with SPO2 in the 70s and a normal blood sugar, EMS had difficulty getting a blood pressure.  An IO was placed in the right humeral head and the patient received 300 mL of LR, 40 mg of lidocaine and 25 mg of Phenergan.  Family later reported to EDP that they found her sitting on a toilet slumped with her neck up against a basket. Of note patient arrived infested with bed bugs and roaches.  Significant labs: On admission:  Chemistry: Na+: 137, K+: 4.8, Cl: 97, BUN/Cr.:  84/2.81, Serum CO2/ AG: 17/23, AST: 155, ALT: 59, CK: 6755 Hematology: WBC: 30.3, Hgb: 15.9, Troponin: 22, Lactic/ PCT: 2.4 > 2.2/ pending, COVID-19 & Influenza A/B: negative VBG: 7.21/53/32/21.2 CXR 05/21/2021: Mild left basilar atelectasis without evidence of an acute infiltrate  Significant Hospital Events: Including procedures, antibiotic start and stop dates in addition to other pertinent events    10/10: Admitted or severe sepsis from infected Vp shunt 10/11: Removal of complete ventriculoatrial shunt and placement of left parietal approach ventriculostomy 10/11 Extubated successfully 10/12 SD status 10/13 Remains ICU status due to neuro checks 10/14 Remains in ICU due to need for neurochecks and external ventricular drain 10/15: Per neuro EVD drain adjusted to 25 cm H20  Significant Test:   CT Head without contrast 05/21/2021: Interval development of mild hydrocephalus and periventricular edema, associated malpositioned left VA shunt that terminates in the anterior mediastinum and no longer within the left brachiocephalic vein.  Shunt noted to be coiled within the neck.  Groundglass opacity within the  right lower lobe and patchy airspace opacity within the left lower lobe. CT Cervical Spine 05/21/2021: No acute fracture or traumatic injury noted.  Interval development of enlarged right thyroid gland. MR Brain 05/25/2021: Few scattered small acute infarcts involving different vascular territories. Ventricular catheter present with similar caliber of ventricles as compared to recent CT. Chronic microvascular ischemic changes, chronic infarcts, and chronic  microhemorrhages.  Micro Data:  10/10: COVID NEG 10/11: HIV NEG 10/10 BCID - proteus spp in 1of4 & staph spp in 2of4 (no resistances detected),  BCID 1of4 MSSE 10/10 BCx: GPCs & GNRs (pending speciation) 10/11 BCx:  10/11: CSF-NGTD   10/10: FLU/COV - negative  Antimicrobials:   Antibiotics Given (last 72 hours)     Date/Time Action Medication Dose Rate   05/23/21 1101 New Bag/Given   ceFEPIme (MAXIPIME) 2 g in sodium chloride 0.9 % 100 mL IVPB 2 g 200 mL/hr   05/23/21 2105 New Bag/Given   ceFEPIme (MAXIPIME) 2 g in sodium chloride 0.9 % 100 mL IVPB 2 g 200 mL/hr   05/24/21 1009 New Bag/Given   cefTRIAXone (ROCEPHIN) 2 g in sodium chloride 0.9 % 100 mL IVPB 2 g 200 mL/hr   05/24/21 1731 New Bag/Given   linezolid (ZYVOX) IVPB 600 mg 600 mg 300 mL/hr   05/25/21 1007 New Bag/Given   cefTRIAXone (ROCEPHIN) 2 g in sodium chloride 0.9 % 100 mL IVPB 2 g 200 mL/hr   05/25/21 1115 New Bag/Given   linezolid (ZYVOX) IVPB 600 mg 600 mg 300 mL/hr   05/25/21 2120 New Bag/Given   linezolid (ZYVOX) IVPB 600 mg 600 mg 300 mL/hr  Scheduled Meds:  budesonide (PULMICORT) nebulizer solution  0.5 mg Nebulization BID   chlorhexidine gluconate (MEDLINE KIT)  15 mL Mouth Rinse BID   Chlorhexidine Gluconate Cloth  6 each Topical Daily   collagenase   Topical Daily   enoxaparin (LOVENOX) injection  40 mg Subcutaneous Q24H   Gerhardt's butt cream   Topical QID   ipratropium-albuterol  3 mL Nebulization Q6H   mouth rinse  15 mL Mouth Rinse 10  times per day   pantoprazole (PROTONIX) IV  40 mg Intravenous QHS   sodium chloride flush  10-40 mL Intracatheter Q12H   thiamine injection  100 mg Intravenous Daily   Continuous Infusions:  cefTRIAXone (ROCEPHIN)  IV 200 mL/hr at 05/26/21 0800   dextrose 100 mL/hr at 05/26/21 0800   linezolid (ZYVOX) IV Stopped (05/25/21 2221)   PRN Meds:.albuterol, docusate sodium, polyethylene glycol, sodium chloride flush    Interim History / Subjective:  Pt awake and following commands.  EVD adjusted to 25 cm H20 per neurosurgery   Objective   Blood pressure (!) 153/85, pulse 81, temperature 98.9 F (37.2 C), temperature source Axillary, resp. rate 17, height _0  (1.575 m), weight 87.1 kg, SpO2 98 %.        Intake/Output Summary (Last 24 hours) at 05/26/2021 0851 Last data filed at 05/26/2021 0800 Gross per 24 hour  Intake 2386.87 ml  Output 1130 ml  Net 1256.87 ml   Filed Weights   05/24/21 0317 05/25/21 0330 05/26/21 0320  Weight: 85.8 kg 85 kg 87.1 kg   REVIEW OF SYSTEMS  PATIENT IS UNABLE TO PROVIDE COMPLETE REVIEW OF SYSTEMS DUE TO SEVERE CRITICAL ILLNESS AND  METABOLIC ENCEPHALOPATHY  Physical examination: General: Acutely ill appearing female, NAD resting in bed  HEENT: MM pink/moist, anicteric, atraumatic, neck supple Neuro: Awake, oriented to self and place with delayed responses, following commands, PERRL CV: S1S2 RRR, NSR on monitor, no r/m/g Pulm: Scattered rhonchi throughout.  Even and non labored  GI: +BS x4, obese, soft, non tender non distended  GU: Foley in place with clear urine Skin: PTA pressure injury posterior neck, scattered bug bites and excoriation on extremities > multiple ecchymotic areas  Extremities: Warm/dry, pulses + 2 R/P, trace edema noted BLE, lymphedema left upper extremity    Pressure Injury 05/22/21 Buttocks Right Deep Tissue Pressure Injury - Purple or maroon localized area of discolored intact skin or blood-filled blister due to damage of  underlying soft tissue from pressure and/or shear. (Active)  05/22/21 0300  Location: Buttocks  Location Orientation: Right  Staging: Deep Tissue Pressure Injury - Purple or maroon localized area of discolored intact skin or blood-filled blister due to damage of underlying soft tissue from pressure and/or shear.  Wound Description (Comments):   Present on Admission: Yes     Pressure Injury 05/22/21 Coccyx Stage 2 -  Partial thickness loss of dermis presenting as a shallow open injury with a red, pink wound bed without slough. (Active)  05/22/21 0300  Location: Coccyx  Location Orientation:   Staging: Stage 2 -  Partial thickness loss of dermis presenting as a shallow open injury with a red, pink wound bed without slough.  Wound Description (Comments):   Present on Admission: Yes     Pressure Injury 05/22/21 Cervical Deep Tissue Pressure Injury - Purple or maroon localized area of discolored intact skin or blood-filled blister due to damage of underlying soft tissue from pressure and/or shear. (Active)  05/22/21 0300  Location: Cervical  Location Orientation:  Staging: Deep Tissue Pressure Injury - Purple or maroon localized area of discolored intact skin or blood-filled blister due to damage of underlying soft tissue from pressure and/or shear.  Wound Description (Comments):   Present on Admission: Yes    Labs/imaging that I havepersonally reviewed  (right click and "Reselect all SmartList Selections" daily)   SYNOPSIS 65 yo admitted for sepsis infected VP shunt s/p post resp failure resolved with EVD in place, renal failure improving   Assessment & Plan:  Acute Respiratory Failure in the postoperative setting after neurosurgical VA shunt removal with EVD placement  hx: "Bronchial  asthma" -Extubated 10/11 -Wean O2 for saturations of 92% or better. -Poor cough mechanics -Aggressive pulmonary toilet -Continue bronchodilators  Rhabdomyolysis secondary to hypovolemia in the  setting of dehydration Increased Anion Gap Metabolic Acidosis - resolved Hypernatremia, hyperchloremia-improving  Acute Kidney Injury-improving  Baseline Cr: 0.9, Cr on admission: 2.81, now 1.19 CK: [phone] - Strict I/O's: alert provider if UOP < 0.5 mL/kg/hr - IVF hydration: D5W increased to 100 ml/hr - U0 not suggestive of DI continue to monitor - Daily BMP, replace electrolytes PRN - Avoid nephrotoxic agents as able, ensure adequate renal perfusion  Severe sepsis without shock due to infected VA shunt in the setting of shock malfunction - Continue supportive care - Has not required pressors - ID consulted appreciate input~will need linezolid x14 days for GP coverage given skin/soft tissue infection and continue ceftriaxone   VA Shunt Malfunction s/p EVD placement 10/11 Acute encephalopathy~MR Brain 10/14 revealed few scattered small acute infarcts involving different vascular territories  - Patient will need definitive shunt, query transfer to [facility] - Frequent neuro checks - Continue thiamine and correct electrolytes  - Management by neurosurgery appreciate input~Repeat CT Head 10/17 pending  - Neurology consulted appreciate input    Transaminitis secondary to hypovolemia in the setting of rhabdomyolysis - Trend hepatic function - Consider RUQ [place], if no improvement post IV fluid resuscitation - Avoid hepatotoxic agents  Posterior Neck, Buttocks & Coccyx Pressure Injuries secondary to being found down at home for unknown amount of time - Q 2 h turns and repositioning as EVD allows - ROM Q4h - WOC consult  Nutrition Status: Nutrition Problem: Inadequate oral intake Etiology: acute illness Signs/Symptoms: NPO status    Best Practice (right click and "Reselect all SmartList Selections" daily)  Diet/type: NPO w/ meds IV, high risk for aspiration, may need Dobbhoff vs NG tube placement to initiate TF's  DVT prophylaxis: SCD GI prophylaxis: PPI Lines: Central  line Foley:  Yes, and it is still needed Code Status:  full code  Labs   CBC: Recent Labs  Lab 05/21/21 1905 05/22/21 0342 05/23/21 0430 05/24/21 0403 05/24/21 1446 05/25/21 0517 05/26/21 0406  WBC 30.3* 21.9* 19.0* 18.2*  --  17.2* 14.5*  NEUTROABS 27.1*  --  16.4*  --   --   --   --   HGB 15.9* 13.4 13.4 13.3 13.2 13.6 11.8*  HCT 49.0* 38.9 39.5 39.2 39.2 40.4 36.5  MCV 96.3 95.3 96.1 99.2  --  100.0 101.1*  PLT 491* 353 401* 366  --  379 295    Basic Metabolic Panel: Recent Labs  Lab 05/22/21 0342 05/23/21 0430 05/24/21 0358 05/25/21 0517 05/25/21 2005 05/26/21 0406  NA 140 144 148* 151* 149* 146*  K 3.8 4.2 4.4 4.2  --  3.9  CL 109 111 114* 118*  --  112*  CO2 18* _0 --  27  GLUCOSE  93 108* 117* 118*  --  142*  BUN 88* 76* 60* 46*  --  31*  CREATININE 2.60* 1.81* 1.38* 1.19*  --  1.05*  CALCIUM 7.3* 8.1* 8.1* 8.1*  --  7.4*  MG 2.4 2.8* 3.1* 3.1*  --  3.0*  PHOS 7.8* 3.8 2.3* 2.8  --  2.4*   GFR: Estimated Creatinine Clearance: 56.2 mL/min (A) (by C-G formula based on SCr of 1.05 mg/dL (H)). Recent Labs  Lab 05/21/21 2146 05/21/21 2344 05/22/21 0323 05/22/21 0342 05/23/21 0430 05/24/21 0358 05/24/21 0403 05/25/21 0517 05/26/21 0406  PROCALCITON  --   --  1.28  --  1.11 0.47  --   --   --   WBC  --   --   --  21.9* 19.0*  --  18.2* 17.2* 14.5*  LATICACIDVEN 2.4* 2.2*  --  1.5  --   --   --   --   --     Liver Function Tests: Recent Labs  Lab 05/21/21 1905 05/25/21 0517  AST 155* 47*  ALT 59* 41  ALKPHOS 76 58  BILITOT 1.1 1.0  PROT 7.6 6.2*  ALBUMIN 3.6 2.5*   No results for input(s): LIPASE, AMYLASE in the last 168 hours. Recent Labs  Lab 05/21/21 1905  AMMONIA 29    ABG    Component Value Date/Time   PHART 7.45 05/23/2021 0940   PCO2ART 32 05/23/2021 0940   PO2ART 80 (L) 05/23/2021 0940   HCO3 22.2 05/23/2021 0940   ACIDBASEDEF 1.0 05/23/2021 0940   O2SAT 96.3 05/23/2021 0940     Coagulation Profile: No results  for input(s): INR, PROTIME in the last 168 hours.  Cardiac Enzymes: Recent Labs  Lab 05/21/21 1905 05/22/21 0342 05/25/21 0517 05/26/21 0406  CKTOTAL 6,755* 5,609* 430* 254*    HbA1C: Hgb A1c MFr Bld  Date/Time Value Ref Range Status  05/22/2021 05:20 AM 5.8 (H) 4.8 - 5.6 % Final    Comment:    (NOTE)         Prediabetes: 5.7 - 6.4         Diabetes: >6.4         Glycemic control for adults with diabetes: <7.0     CBG: Recent Labs  Lab 05/25/21 1550 05/25/21 1910 05/25/21 2327 05/26/21 0313 05/26/21 0732  GLUCAP 107* 116* 151* 126* 132*    Allergies No Known Allergies    Patient was discussed during multidisciplinary rounds with ICU team.  Patient will need definitive neurosurgical treatment and may need transfer to [facility].   Ultimate disposition the patient will need likely skilled nursing facility.  Transition of care team has been consulted.  Critical Care Time: 30 minutes   Rosilyn Mings, AGNP  Pulmonary/Critical Care Pager [phone] (please enter 7 digits) PCCM Consult Pager [phone] (please enter 7 digits)

## 2021-05-26 NOTE — Progress Notes (Signed)
Patient's EVD adjusted to 25 cm H20 per verbal orders from Dr. Tamala Julian, who visited the patient at bedside. Will continue to monitor and assess patient.  Cameron Ali, RN

## 2021-05-26 NOTE — Progress Notes (Signed)
*  PRELIMINARY RESULTS* Echocardiogram 2D Echocardiogram has been performed.  Desiree Mitchell 05/26/2021, 2:13 PM

## 2021-05-26 NOTE — Progress Notes (Signed)
Attending Progress Note  History: Desiree Mitchell presenting on 05/21/21 with after being found unresponsive by family. She has a history of prior subarachnoid hemorrhage requiring shunt placement. Resulting in neurosurgery consult. CT head showed increased ventricular size concerning for shunt malfunction.  She underwent removal of shunt and placement of EVD on 05/21/21.   POD#5: Stable CSF output, Patient improved alertness without headache. Increasing level of EVD to 25.  POD#4: MRI showing some watershed type infarcts  POD#3: NAEO  POD#2: patient extubated yesterday but increased O2 requirement overnight   POD#1: Currently off all sedation  Physical Exam: Vitals:   05/26/21 0600 05/26/21 0700  BP: (!) 152/86 (!) 155/75  Pulse: 81 92  Resp: 17 (!) 26  Temp:    SpO2: 95% 93%    PERRL Opens eyes to voice. Following simple one step commands. Able to state name and speak in simple statements Left sided neglect vs plegia.  CSF remains clear.  EVD output by 8h shift. 60/50/19  Data:  Recent Labs  Lab 05/24/21 0358 05/25/21 0517 05/25/21 2005 05/26/21 0406  NA 148* 151*   < > 146*  K 4.4 4.2  --  3.9  CL 114* 118*  --  112*  CO2 26 25  --  27  BUN 60* 46*  --  31*  CREATININE 1.38* 1.19*  --  1.05*  GLUCOSE 117* 118*  --  142*  CALCIUM 8.1* 8.1*  --  7.4*   < > = values in this interval not displayed.   Recent Labs  Lab 05/25/21 0517  AST 47*  ALT 41  ALKPHOS 58     Recent Labs  Lab 05/24/21 0403 05/24/21 1446 05/25/21 0517 05/26/21 0406  WBC 18.2*  --  17.2* 14.5*  HGB 13.3   < > 13.6 11.8*  HCT 39.2   < > 40.4 36.5  PLT 366  --  379 340   < > = values in this interval not displayed.   No results for input(s): APTT, INR in the last 168 hours.       Other tests/results:  CT head  IMPRESSION: 1. Interval development of mild hydrocephalus and periventricular edema. Associated malpositioned left VA shunt that terminates in the anterior  mediastinum and no longer within the left brachiocephalic vein as noted on CT miscellaneous 10/03/2011. The shunt is noted to be coiled within the neck. Recommend neurosurgery consultation. 2. No acute displaced fracture or traumatic listhesis of the cervical spine. 3. Ground-glass airspace opacity within the right lower lobe and patchy airspace opacity within left lower lobe. Findings may represent a combination of infection/inflammation as well as atelectasis. 4. No acute traumatic injury to the chest, abdomen, or pelvis with limited evaluation on this noncontrast study.   5. No acute fracture or traumatic malalignment of the thoracic or lumbar spine. 6.  Aortic Atherosclerosis (ICD10-I70.0). 7. Interval development of an enlarged right thyroid gland with limited evaluation due to streak artifact. Recommend thyroid ultrasound (ref: J Am Coll Radiol. 2015 Feb;12(2): 143-50).   These results were called by telephone at the time of interpretation on 05/21/2021 at 9:48 pm to provider [facility] , who verbally acknowledged these results.   Electronically Signed   By: Iven Finn M.D.   On: 05/21/2021 21:54   Narrative & Impression  CLINICAL DATA:  Neuro deficit, acute, stroke suspected   EXAM: MRI HEAD WITHOUT CONTRAST   TECHNIQUE: Multiplanar, multiecho pulse sequences of the brain and surrounding structures were obtained  without intravenous contrast.   COMPARISON:  Correlation made with prior CTs   FINDINGS: Brain: There are a few scattered small foci of reduced diffusion including involvement of the right basal ganglia and corona radiata, left basal ganglia, right inferior parietal cortex, parasagittal left frontal cortex, and anterior right cerebellum.   Left posterior approach ventricular catheter extends to the right of midline traversing the third ventricle with tip extending laterally between frontal horn and third ventricle. Prior right frontal catheter  tract. Ventricles are similar in size to recent CT imaging.   Right anterior temporal encephalomalacia. 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 Chronic infarcts and/or perivascular spaces along the central cerebral white matter and deep gray nuclei. Patchy and confluent areas of T2 hyperintensity in the supratentorial white matter may reflect chronic microvascular ischemic changes. Scattered foci of susceptibility likely reflecting chronic microhemorrhages due to hypertension.   No intracranial mass or mass effect.   Vascular: Major vessel flow voids at the sk few scattered small acute ull base are preserved.   Skull and upper cervical spine: Normal marrow signal is preserved.   Sinuses/Orbits: Paranasal sinuses are aerated. Orbits are unremarkable.   Other: Sella is unremarkable.  Mastoid air cells are clear.   IMPRESSION: Few scattered small acute infarcts involving different vascular territories.   Ventricular catheter present with similar caliber of ventricles as compared to recent CT.   Chronic microvascular ischemic changes, chronic infarcts, and chronic microhemorrhages.     Electronically Signed   By: Macy Mis M.D.   On: 05/25/2021 15:31    Assessment/Plan:  Desiree Mitchell is a 65 y.o presenting to the ER after being found down by her family on 05/21/21. She was found to be septic and head CT was concerning for shunt malfunction.  She underwent removal of VA shunt on 05/21/2021 and placement of EVD.  - EVD in place. Increase to 25 today.  - continue q1 neuro checks - CSF cultures NTD - MRI demonstrated multifocal infarcts, please consult neurology for further management.  - remainder of management per recommendations of critical care.  Tyler Pita Department of Neurosurgery

## 2021-05-26 NOTE — Progress Notes (Signed)
Desiree Mitchell for Electrolyte Monitoring and Replacement   Recent Labs: Potassium (mmol/L)  Date Value  05/26/2021 3.9   Magnesium (mg/dL)  Date Value  05/26/2021 3.0 (H)   Calcium (mg/dL)  Date Value  05/26/2021 7.4 (L)   Albumin (g/dL)  Date Value  05/25/2021 2.5 (L)   Phosphorus (mg/dL)  Date Value  05/26/2021 2.4 (L)   Sodium (mmol/L)  Date Value  05/26/2021 146 (H)   Albumin corrected Calcium: 8.6  Assessment: 65yo Female with h/o SAH req'ing shunt >10y ago presents with AMS, leukocytosis, and renal failure. Pt went to OR to have shunt removed & EVD was placed. Pt transferred to ICU and intubated started on empiric abx. Pharmacy consulted for electrolyte mgmt.  Diet: NPO  Goal of Therapy:  Electrolytes within normal limits  Plan:  Phos 2.4.  Will order Potassium Phosphate 10 mmol IV x 1. Na 146  Hypernatremia: 5% dextrose at 75 mL/hr (MD order) Will continue to follow along  Desiree Mitchell A 05/26/2021 9:35 AM

## 2021-05-26 NOTE — Plan of Care (Signed)
Neuro: intermittently alert/responds to voice, slight purposeful movement with R UE, follows commands-wiggles toes/grips R>L, oriented to self and sometimes to place as in hospital but location is [facility], EVD in place and patent, overall output decreasing throughout the day, except following echo Resp: stable on 4L [place] CV: afebrile, vital signs stable, generalized edema GIGU: NPO, NG placed-awaiting XR placement confirmation, small BM, foley in place Skin: significant scattered bite wounds and pressure wounds-see media and flowsheets, wound care completed per orders Social: no contact with family  Problem: Education: Goal: Knowledge of General Education information will improve Description: Including pain rating scale, medication(s)/side effects and non-pharmacologic comfort measures Outcome: Not Progressing   Problem: Health Behavior/Discharge Planning: Goal: Ability to manage health-related needs will improve Outcome: Not Progressing   Problem: Clinical Measurements: Goal: Ability to maintain clinical measurements within normal limits will improve Outcome: Not Progressing Goal: Will remain free from infection Outcome: Not Progressing Goal: Diagnostic test results will improve Outcome: Not Progressing Goal: Respiratory complications will improve Outcome: Not Progressing Goal: Cardiovascular complication will be avoided Outcome: Not Progressing   Problem: Activity: Goal: Risk for activity intolerance will decrease Outcome: Not Progressing   Problem: Nutrition: Goal: Adequate nutrition will be maintained Outcome: Not Progressing   Problem: Coping: Goal: Level of anxiety will decrease Outcome: Not Progressing   Problem: Elimination: Goal: Will not experience complications related to bowel motility Outcome: Not Progressing Goal: Will not experience complications related to urinary retention Outcome: Not Progressing   Problem: Pain Managment: Goal: General experience of  comfort will improve Outcome: Not Progressing   Problem: Safety: Goal: Ability to remain free from injury will improve Outcome: Not Progressing   Problem: Skin Integrity: Goal: Risk for impaired skin integrity will decrease Outcome: Not Progressing

## 2021-05-27 DIAGNOSIS — I6340 Cerebral infarction due to embolism of unspecified cerebral artery: Secondary | ICD-10-CM

## 2021-05-27 DIAGNOSIS — T85618D Breakdown (mechanical) of other specified internal prosthetic devices, implants and grafts, subsequent encounter: Secondary | ICD-10-CM | POA: Diagnosis not present

## 2021-05-27 DIAGNOSIS — A4159 Other Gram-negative sepsis: Secondary | ICD-10-CM | POA: Diagnosis not present

## 2021-05-27 DIAGNOSIS — G9340 Encephalopathy, unspecified: Secondary | ICD-10-CM | POA: Diagnosis not present

## 2021-05-27 LAB — ECHOCARDIOGRAM COMPLETE
AR max vel: 2.04 cm2
AV Peak grad: 5.5 mmHg
Ao pk vel: 1.17 m/s
Area-P 1/2: 3.34 cm2
Height: 62 in
S' Lateral: 3.55 cm
Weight: 3072.33 oz

## 2021-05-27 LAB — MAGNESIUM: Magnesium: 2.4 mg/dL (ref 1.7–2.4)

## 2021-05-27 LAB — CULTURE, BLOOD (ROUTINE X 2)
Culture: NO GROWTH
Special Requests: ADEQUATE

## 2021-05-27 LAB — AEROBIC/ANAEROBIC CULTURE W GRAM STAIN (SURGICAL/DEEP WOUND)
Culture: NO GROWTH
Gram Stain: NONE SEEN

## 2021-05-27 LAB — CBC
HCT: 35.2 % — ABNORMAL LOW (ref 36.0–46.0)
Hemoglobin: 11.6 g/dL — ABNORMAL LOW (ref 12.0–15.0)
MCH: 32.7 pg (ref 26.0–34.0)
MCHC: 33 g/dL (ref 30.0–36.0)
MCV: 99.2 fL (ref 80.0–100.0)
Platelets: 314 10*3/uL (ref 150–400)
RBC: 3.55 MIL/uL — ABNORMAL LOW (ref 3.87–5.11)
RDW: 14 % (ref 11.5–15.5)
WBC: 12.9 10*3/uL — ABNORMAL HIGH (ref 4.0–10.5)
nRBC: 0 % (ref 0.0–0.2)

## 2021-05-27 LAB — BASIC METABOLIC PANEL
Anion gap: 7 (ref 5–15)
BUN: 23 mg/dL (ref 8–23)
CO2: 27 mmol/L (ref 22–32)
Calcium: 7.5 mg/dL — ABNORMAL LOW (ref 8.9–10.3)
Chloride: 104 mmol/L (ref 98–111)
Creatinine, Ser: 0.88 mg/dL (ref 0.44–1.00)
GFR, Estimated: >60 mL/min (ref >60)
Glucose, Bld: 113 mg/dL — ABNORMAL HIGH (ref 70–99)
Potassium: 4.4 mmol/L (ref 3.5–5.1)
Sodium: 138 mmol/L (ref 135–145)

## 2021-05-27 LAB — THYROID PANEL WITH TSH
Free Thyroxine Index: 2.4 (ref 1.2–4.9)
T3 Uptake Ratio: 36 % (ref 24–39)
T4, Total: 6.7 ug/dL (ref 4.5–12.0)
TSH: 2.16 u[IU]/mL (ref 0.450–4.500)

## 2021-05-27 LAB — SODIUM
Sodium: 135 mmol/L (ref 135–145)
Sodium: 139 mmol/L (ref 135–145)

## 2021-05-27 LAB — GLUCOSE, CAPILLARY
Glucose-Capillary: 100 mg/dL — ABNORMAL HIGH (ref 70–99)
Glucose-Capillary: 100 mg/dL — ABNORMAL HIGH (ref 70–99)
Glucose-Capillary: 113 mg/dL — ABNORMAL HIGH (ref 70–99)
Glucose-Capillary: 122 mg/dL — ABNORMAL HIGH (ref 70–99)
Glucose-Capillary: 124 mg/dL — ABNORMAL HIGH (ref 70–99)
Glucose-Capillary: 98 mg/dL (ref 70–99)

## 2021-05-27 LAB — PHOSPHORUS: Phosphorus: 3.3 mg/dL (ref 2.5–4.6)

## 2021-05-27 LAB — CK: Total CK: 157 U/L (ref 38–234)

## 2021-05-27 MED ORDER — OSMOLITE 1.2 CAL PO LIQD
1000.0000 mL | ORAL | Status: DC
  Administered 2021-05-27 – 2021-06-07 (×14): 1000 mL

## 2021-05-27 MED ORDER — FUROSEMIDE 10 MG/ML IJ SOLN
20.0000 mg | Freq: Once | INTRAMUSCULAR | Status: AC
  Administered 2021-05-27: 20 mg via INTRAVENOUS
  Filled 2021-05-27: qty 2

## 2021-05-27 MED ORDER — LABETALOL HCL 5 MG/ML IV SOLN
10.0000 mg | INTRAVENOUS | Status: DC | PRN
  Administered 2021-06-05: 07:00:00 10 mg via INTRAVENOUS
  Filled 2021-05-27: qty 4

## 2021-05-27 MED ORDER — FREE WATER
50.0000 mL | Status: DC
  Administered 2021-05-27 (×2): 50 mL

## 2021-05-27 MED ORDER — FREE WATER
30.0000 mL | Status: DC
  Administered 2021-05-28 (×3): 30 mL

## 2021-05-27 MED ORDER — CYANOCOBALAMIN 1000 MCG/ML IJ SOLN
1000.0000 ug | Freq: Every day | INTRAMUSCULAR | Status: AC
  Administered 2021-05-28 – 2021-05-30 (×3): 1000 ug via INTRAMUSCULAR
  Filled 2021-05-27 (×3): qty 1

## 2021-05-27 NOTE — TOC Progression Note (Signed)
Transition of Care [facility]) - Progression Note    Patient Details  Name: Desiree Mitchell MRN: [id number] Date of Birth: [birth year]  Transition of Care [facility]) CM/SW [place], [place] Phone Number: 05/27/2021, 1:07 PM  Clinical Narrative:     MR Brain 05/25/2021: Few scattered small acute infarcts involving different vascular territories. Ventricular catheter present with similar caliber of ventricles as compared to recent CT. Chronic microvascular ischemic changes, chronic infarcts, and chronic micro hemorrhages. Patient remains intermittently alter/responds to voice, and sometimes oriented to self and situation. Stable on 4L O2 nasal cannula. Main contact [name] (Daughter) [phone] [facility]), no update on [place] conversation. Palliative following. Remains FULL SCOPE.  Expected Discharge Plan: Long Term Acute Care (LTAC) Barriers to Discharge: Continued Medical Work up  Expected Discharge Plan and Services Expected Discharge Plan: Long Term Acute Care (LTAC) In-house Referral: Clinical Social Work   Post Acute Care Choice: Long Term Acute Care (LTAC) Living arrangements for the past 2 months: Single Family Home                                       Social Determinants of Health (SDOH) Interventions    Readmission Risk Interventions No flowsheet data found.

## 2021-05-27 NOTE — Progress Notes (Signed)
Nutrition Brief Note RD working remotely.   Consult received for initiation and management of TF.   Wt Readings from Last 15 Encounters:  05/26/21 87.1 kg    Body mass index is 35.12 kg/m. Patient meets criteria for obesity based on current BMI.   Full RD assessment note on 10/14. TF recommendations provided on that date for Osmolite 1.2 @ 40 ml/hr to advance by 10 ml every 8 hours to reach goal rate of 70 ml/hr with 50 ml free water every 4 hours.   At goal rate, this regimen will provide 2016 kcal, 93 grams protein, and 1678 ml free water.   A 14 F NGT was placed yesterday at ~1840.    Estimated Nutritional Needs:  Kcal:  1800-2100kcal/day Protein:  90-105g/day Fluid:  1.5-1.8L/day      Jarome Matin, MS, RD, LDN, CNSC Inpatient Clinical Dietitian RD pager # available in [facility]  After hours/weekend pager # available in [facility]

## 2021-05-27 NOTE — Progress Notes (Signed)
Attending Progress Note  History: Desiree Mitchell presenting on 05/21/21 with after being found unresponsive by family. She has a history of prior subarachnoid hemorrhage requiring shunt placement. Resulting in neurosurgery consult. CT head showed increased ventricular size concerning for shunt malfunction.  She underwent removal of shunt and placement of EVD on 05/21/21.   POD #6: decreased CSF output as expected. Stable Clinically  POD#5: Stable CSF output, Patient improved alertness without headache. Increasing level of EVD to 25.  POD#4: MRI showing some watershed type infarcts  POD#3: NAEO  POD#2: patient extubated yesterday but increased O2 requirement overnight   POD#1: Currently off all sedation  Physical Exam: Vitals:   05/27/21 2100 05/27/21 2200  BP: (!) 148/90 (!) 144/85  Pulse: (!) 104 (!) 103  Resp: (!) 25 (!) 24  Temp:    SpO2: 92% 94%    PERRL Opens eyes to voice. Following simple one step commands. Able to state name and speak in simple statements Left sided neglect vs plegia.  CSF remains clear.  EVD output by 8h shift. 10/31/25  Data:  Recent Labs  Lab 05/25/21 0517 05/25/21 2005 05/26/21 0406 05/26/21 1254 05/27/21 0541 05/27/21 2117  NA 151*   < > 146*   < > 138 135  K 4.2  --  3.9  --  4.4  --   CL 118*  --  112*  --  104  --   CO2 25  --  27  --  27  --   BUN 46*  --  31*  --  23  --   CREATININE 1.19*  --  1.05*  --  0.88  --   GLUCOSE 118*  --  142*  --  113*  --   CALCIUM 8.1*  --  7.4*  --  7.5*  --    < > = values in this interval not displayed.   Recent Labs  Lab 05/25/21 0517  AST 47*  ALT 41  ALKPHOS 58     Recent Labs  Lab 05/25/21 0517 05/26/21 0406 05/27/21 0541  WBC 17.2* 14.5* 12.9*  HGB 13.6 11.8* 11.6*  HCT 40.4 36.5 35.2*  PLT 379 340 314   No results for input(s): APTT, INR in the last 168 hours.       Other tests/results:  CT head  IMPRESSION: 1. Interval development of mild hydrocephalus and  periventricular edema. 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 The shunt is noted to be coiled within the neck. Recommend neurosurgery consultation. 2. No acute displaced fracture or traumatic listhesis of the cervical spine. 3. Ground-glass airspace opacity within the right lower lobe and patchy airspace opacity within left lower lobe. Findings may represent a combination of infection/inflammation as well as atelectasis. 4. No acute traumatic injury to the chest, abdomen, or pelvis with limited evaluation on this noncontrast study.   5. No acute fracture or traumatic malalignment of the thoracic or lumbar spine. 6.  Aortic Atherosclerosis (ICD10-I70.0). 7. Interval development of an enlarged right thyroid gland with limited evaluation due to streak artifact. Recommend thyroid ultrasound (ref: J Am Coll Radiol. 2015 Feb;12(2): 143-50).   These results were called by telephone at the time of interpretation on 05/21/2021 at 9:48 pm to provider [facility] , who verbally acknowledged these results.   Electronically Signed   By: Iven Finn M.D.   On: 05/21/2021  21:54   Narrative & Impression  CLINICAL DATA:  Neuro deficit, acute, stroke suspected   EXAM: MRI HEAD WITHOUT CONTRAST   TECHNIQUE: Multiplanar, multiecho pulse sequences of the brain and surrounding structures were obtained without intravenous contrast.   COMPARISON:  Correlation made with prior CTs   FINDINGS: Brain: There are a few scattered small foci of reduced diffusion including involvement of the right basal ganglia and corona radiata, left basal ganglia, right inferior parietal cortex, parasagittal left frontal cortex, and anterior right cerebellum.   Left posterior approach ventricular catheter extends to the right of midline traversing the third ventricle with tip  extending laterally between frontal horn and third ventricle. Prior right frontal catheter tract. Ventricles are similar in size to recent CT imaging.   Right anterior temporal encephalomalacia. 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 Chronic infarcts and/or perivascular spaces along the central cerebral white matter and deep gray nuclei. Patchy and confluent areas of T2 hyperintensity in the supratentorial white matter may reflect chronic microvascular ischemic changes. Scattered foci of susceptibility likely reflecting chronic microhemorrhages due to hypertension.   No intracranial mass or mass effect.   Vascular: Major vessel flow voids at the sk few scattered small acute ull base are preserved.   Skull and upper cervical spine: Normal marrow signal is preserved.   Sinuses/Orbits: Paranasal sinuses are aerated. Orbits are unremarkable.   Other: Sella is unremarkable.  Mastoid air cells are clear.   IMPRESSION: Few scattered small acute infarcts involving different vascular territories.   Ventricular catheter present with similar caliber of ventricles as compared to recent CT.   Chronic microvascular ischemic changes, chronic infarcts, and chronic microhemorrhages.     Electronically Signed   By: Macy Mis M.D.   On: 05/25/2021 15:31    Assessment/Plan:  Desiree Mitchell is a 65 y.o presenting to the ER after being found down by her family on 05/21/21. She was found to be septic and head CT was concerning for shunt malfunction.  She underwent removal of VA shunt on 05/21/2021 and placement of EVD.  - EVD in place. Keep at 80.  - continue q1 neuro checks - MRI demonstrated multifocal infarcts, please consult neurology for further management.  - CT 05/28/21 - remainder of management per recommendations of critical care, plan discussed in person with the bedside RN and ICU APP on morning rounds.  Tyler Pita Department of Neurosurgery

## 2021-05-27 NOTE — Consult Note (Signed)
Neurology Consultation Reason for Consult: Stroke Referring Physician: Patsey Berthold, Loletha Grayer  CC: Altered mental status  History is obtained from: Chart review  HPI: Desiree Mitchell is a 65 y.o. female with a history of previous shunt placed secondary to hydrocephalus due to subarachnoid hemorrhage more than a decade ago who presents after being found down by family sitting on the toilet.  Her ventricular size had increased between 2014 and 2022, with no interval imaging as the patient has been lost to follow-up, and this coupled with her mental status prompted placement of an external ventricular drain.  She continued to be encephalopathic and therefore an MRI was obtained which shows multifocal ischemic infarcts.   She had both staph epidermidis and Proteus growing in cultures from the blood.  This appears to be in 2/2 cultures.  CSF cultures have been negative   LKW: Unclear tpa given?: no, unclear time of onset   ROS: Unable to obtain due to altered mental status.   PMHx: Unable to obtain due to altered mental status.  Family history: Unable to obtain due to altered mental status.  Social History: Unable to obtain due to altered mental status.  Exam: Current vital signs: BP (!) 150/82   Pulse 81   Temp (!) (P) 97.5 F (36.4 C) (Oral)   Resp (!) 22   Ht 5\' 2"  (1.575 m)   Wt 87.1 kg   SpO2 96%   BMI 35.12 kg/m  Vital signs in last 24 hours: Temp:  [97.5 F (36.4 C)-98.5 F (36.9 C)] (P) 97.5 F (36.4 C) (10/16 1200) Pulse Rate:  [74-96] 81 (10/16 1400) Resp:  [13-26] 22 (10/16 1400) BP: (138-179)/(70-104) 150/82 (10/16 1400) SpO2:  [87 %-99 %] 96 % (10/16 1400)   Physical Exam  Constitutional: Appears ill.  Psych: Brief answers, appears to not want to engage Eyes: No scleral injection HENT: No OP obstruction MSK: no joint deformities.  Cardiovascular: Normal rate and regular rhythm.  Respiratory: Effort normal, non-labored breathing GI: Soft.  No distension. There is  no tenderness.  Skin: Multiple treated areas with lotion over both sides  Neuro: Mental Status: Patient awakens easily to mild stimulation.  When I ask her a question, there is significant delay in response, but when she does respond it is with fairly fluent speech, though responses are brief. She is able to name simple objects. When I ask her questions, sometimes she responds and sometimes she does not, she is not very cooperative with motor testing. Cranial Nerves: II: Visual Fields are full. Pupils are equal, round, and reactive to light.   III,IV, VI: EOMI without ptosis or diploplia.  V: Facial sensation is symmetric to temperature VII: Facial movement is symmetric.  VIII: hearing is intact to voice X: Uvula elevates symmetrically XI: Shoulder shrug is symmetric. XII: tongue is midline without atrophy or fasciculations.  Motor: She appears weaker on the left than right, though unclear how much is effort dependent.  She is able to lift her right arm against gravity, the left barely so.  She is able wiggle toes bilaterally, unable to lift either leg Sensory: Sensation is diminished on the right Deep Tendon Reflexes: 1+ and symmetric in the biceps and absent at the patellae. Cerebellar: Does not perform   I have reviewed labs in epic and the results pertinent to this consultation are: LDL 63 Thyroid panel-negative B12 214  I have reviewed the images obtained: Multifocal embolic appearing infarcts  Impression: 65 year old female with what appears to be multifocal embolic  infarcts in the setting of sepsis and possible shunt malfunction.  Positive blood cultures with embolic infarcts obviously raised the possibility of endocarditis, but no obvious vegetations were seen on echo today.  I do think that a transesophageal echocardiogram is indicated.  Her B12 of 214 is not low by laboratory standards, but can certainly be symptomatic at that level.  I would favor doing a few B12  injections in case she is symptomatic during a period of physiological stress.  Recommendations: 1) once acceptable from a surgical standpoint, would start daily aspirin 2) continue telemetry monitoring 3) consider transesophageal echocardiogram 4) treatment of shunt malfunction/sepsis per CCM/neurosurgery 5) B12 injections, check homocystine 6) neurology will continue to follow   Roland Rack, MD [facility] [phone]  If 7pm- 7am, please page neurology on call as listed in [place].

## 2021-05-27 NOTE — Plan of Care (Signed)
Neuro: neuro status varies, intermittently started to remember she is either at a hospital or specifically [place], moves minimally in bed Resp: stable on 4L [place], congested productive cough, OP suctioning with catheter helps CV: afebrile, vital signs relatively stable, evening hypertension due to patient bearing down stating "I have to pee" continued to do so despite being notified repeatedly that she has a foley in place GIGU: foley in place, following lasix urinated around foley-filled bed with urine, BM x 1, stared tube feeds, tolerating well, asking to eat Skin: see flowsheet Social: visitor today, this RN was unable to make contact before they left, was told it was daughter  Problem: Education: Goal: Knowledge of General Education information will improve Description: Including pain rating scale, medication(s)/side effects and non-pharmacologic comfort measures Outcome: Not Progressing   Problem: Health Behavior/Discharge Planning: Goal: Ability to manage health-related needs will improve Outcome: Not Progressing   Problem: Clinical Measurements: Goal: Ability to maintain clinical measurements within normal limits will improve Outcome: Not Progressing Goal: Will remain free from infection Outcome: Not Progressing Goal: Diagnostic test results will improve Outcome: Not Progressing Goal: Respiratory complications will improve Outcome: Not Progressing Goal: Cardiovascular complication will be avoided Outcome: Not Progressing   Problem: Activity: Goal: Risk for activity intolerance will decrease Outcome: Not Progressing   Problem: Nutrition: Goal: Adequate nutrition will be maintained Outcome: Not Progressing   Problem: Coping: Goal: Level of anxiety will decrease Outcome: Not Progressing   Problem: Elimination: Goal: Will not experience complications related to bowel motility Outcome: Not Progressing Goal: Will not experience complications related to urinary  retention Outcome: Not Progressing   Problem: Pain Managment: Goal: General experience of comfort will improve Outcome: Not Progressing   Problem: Safety: Goal: Ability to remain free from injury will improve Outcome: Not Progressing   Problem: Skin Integrity: Goal: Risk for impaired skin integrity will decrease Outcome: Not Progressing

## 2021-05-27 NOTE — Progress Notes (Signed)
[place] for Electrolyte Monitoring and Replacement   Recent Labs: Potassium (mmol/L)  Date Value  05/27/2021 4.4   Magnesium (mg/dL)  Date Value  05/27/2021 2.4   Calcium (mg/dL)  Date Value  05/27/2021 7.5 (L)   Albumin (g/dL)  Date Value  05/25/2021 2.5 (L)   Phosphorus (mg/dL)  Date Value  05/27/2021 3.3   Sodium (mmol/L)  Date Value  05/27/2021 138   Albumin corrected Calcium: 8.6  Assessment: 65yo Female with h/o SAH req'ing shunt >10y ago presents with AMS, leukocytosis, and renal failure. Pt went to OR to have shunt removed & EVD was placed. Pt transferred to ICU and intubated started on empiric abx. Pharmacy consulted for electrolyte mgmt.  Diet: NPO  Goal of Therapy:  Electrolytes within normal limits  Plan:  Na 138 improved  Hypernatremia: 5% dextrose at 30 mL/hr (MD order) Will continue to follow along  Desiree Mitchell A 05/27/2021 10:52 AM

## 2021-05-27 NOTE — Progress Notes (Signed)
NAME:  Desiree Mitchell, MRN:  [id number], DOB:  [birth year], LOS: 5 ADMISSION DATE:  05/21/2021  65 year old female presented at [facility] ED on 05/21/2021 from home via EMS after being found unresponsive by family.  Patient was last seen normal on Saturday, 05/19/2021 at 4 PM.  On EMS arrival patient was unresponsive and hypoxic with SPO2 in the 70s and a normal blood sugar, EMS had difficulty getting a blood pressure.  An IO was placed in the right humeral head and the patient received 300 mL of LR, 40 mg of lidocaine and 25 mg of Phenergan.  Family later reported to EDP that they found her sitting on a toilet slumped with her neck up against a basket. Of note patient arrived infested with bed bugs and roaches.  Significant labs: On admission:  Chemistry: Na+: 137, K+: 4.8, Cl: 97, BUN/Cr.:  84/2.81, Serum CO2/ AG: 17/23, AST: 155, ALT: 59, CK: 6755 Hematology: WBC: 30.3, Hgb: 15.9, Troponin: 22, Lactic/ PCT: 2.4 > 2.2/ pending, COVID-19 & Influenza A/B: negative VBG: 7.21/53/32/21.2 CXR 05/21/2021: Mild left basilar atelectasis without evidence of an acute infiltrate  Significant Hospital Events: Including procedures, antibiotic start and stop dates in addition to other pertinent events    10/10: Admitted or severe sepsis from infected Vp shunt 10/11: Removal of complete ventriculoatrial shunt and placement of left parietal approach ventriculostomy 10/11 Extubated successfully 10/12 SD status 10/13 Remains ICU status due to neuro checks 10/14 Remains in ICU due to need for neurochecks and external ventricular drain 10/15: Per neurosurg EVD drain adjusted to 25 cm H20 10/16: Neurology evaluation for multiple infarcts, EVD remains  Significant Test:   CT Head without contrast 05/21/2021: Interval development of mild hydrocephalus and periventricular edema, associated malpositioned left VA shunt that terminates in the anterior mediastinum and no longer within the left brachiocephalic vein.  Shunt  noted to be coiled within the neck.  Groundglass opacity within the right lower lobe and patchy airspace opacity within the left lower lobe. CT Cervical Spine 05/21/2021: No acute fracture or traumatic injury noted.  Interval development of enlarged right thyroid gland. MR Brain 05/25/2021: Few scattered small acute infarcts involving different vascular territories. Ventricular catheter present with similar caliber of ventricles as compared to recent CT. Chronic microvascular ischemic changes, chronic infarcts, and chronic  microhemorrhages.  Micro Data:  10/10: COVID NEG 10/11: HIV NEG 10/10 BCID - proteus spp in 1of4 & staph spp in 2of4 (no resistances detected),  BCID 1of4 MSSE 10/10 BCx: GPCs & GNRs (pending speciation) 10/11 BCx:  10/11: CSF-NGTD   10/10: FLU/COV - negative  Antimicrobials:   Antibiotics Given (last 72 hours)     Date/Time Action Medication Dose Rate   05/24/21 1731 New Bag/Given   linezolid (ZYVOX) IVPB 600 mg 600 mg 300 mL/hr   05/25/21 1007 New Bag/Given   cefTRIAXone (ROCEPHIN) 2 g in sodium chloride 0.9 % 100 mL IVPB 2 g 200 mL/hr   05/25/21 1115 New Bag/Given   linezolid (ZYVOX) IVPB 600 mg 600 mg 300 mL/hr   05/25/21 2120 New Bag/Given   linezolid (ZYVOX) IVPB 600 mg 600 mg 300 mL/hr   05/26/21 1015 New Bag/Given   cefTRIAXone (ROCEPHIN) 2 g in sodium chloride 0.9 % 100 mL IVPB 2 g 200 mL/hr   05/26/21 1134 New Bag/Given   linezolid (ZYVOX) IVPB 600 mg 600 mg 300 mL/hr   05/26/21 2250 New Bag/Given   linezolid (ZYVOX) IVPB 600 mg 600 mg 300 mL/hr   05/27/21 1034 New Bag/Given  cefTRIAXone (ROCEPHIN) 2 g in sodium chloride 0.9 % 100 mL IVPB 2 g 200 mL/hr      Scheduled Meds:  budesonide (PULMICORT) nebulizer solution  0.5 mg Nebulization BID   chlorhexidine gluconate (MEDLINE KIT)  15 mL Mouth Rinse BID   Chlorhexidine Gluconate Cloth  6 each Topical Daily   collagenase   Topical Daily   enoxaparin (LOVENOX) injection  40 mg Subcutaneous Q24H    Gerhardt's butt cream   Topical QID   ipratropium-albuterol  3 mL Nebulization Q6H   pantoprazole (PROTONIX) IV  40 mg Intravenous QHS   sodium chloride flush  10-40 mL Intracatheter Q12H   thiamine injection  100 mg Intravenous Daily   Continuous Infusions:  cefTRIAXone (ROCEPHIN)  IV 2 g (05/27/21 1034)   dextrose 30 mL/hr at 05/27/21 1000   linezolid (ZYVOX) IV 600 mg (05/26/21 2250)   PRN Meds:.albuterol, docusate sodium, polyethylene glycol, sodium chloride flush    Interim History / Subjective:  Pt awake and following commands however with significant psychomotor retardation.  EVD adjusted to 25 cm H20 per neurosurgery yesterday, no input of neurosurgery today.  Objective   Blood pressure (!) 169/104, pulse 94, temperature 98.2 F (36.8 C), temperature source Oral, resp. rate 20, height 5' 2"  (1.575 m), weight 87.1 kg, SpO2 96 %.        Intake/Output Summary (Last 24 hours) at 05/27/2021 1054 Last data filed at 05/27/2021 1035 Gross per 24 hour  Intake 1912.54 ml  Output 572.5 ml  Net 1340.04 ml    Filed Weights   05/24/21 0317 05/25/21 0330 05/26/21 0320  Weight: 85.8 kg 85 kg 87.1 kg   REVIEW OF SYSTEMS  PATIENT IS UNABLE TO PROVIDE COMPLETE REVIEW OF SYSTEMS DUE TO SEVERE CRITICAL ILLNESS AND  METABOLIC ENCEPHALOPATHY  Physical examination: General: Acutely ill appearing female, NAD resting in bed  HEENT: MM pink/moist, anicteric, atraumatic, neck supple Neuro: Awake, oriented to self and place with very delayed responses, following commands, PERRL CV: S1S2 RRR, NSR on monitor, no r/m/g Pulm: Scattered rhonchi throughout.  Even and non labored  GI: +BS x4, obese, soft, non tender non distended  GU: Foley in place with clear urine Skin: PTA pressure injury posterior neck, scattered bug bites and excoriation on extremities > multiple ecchymotic areas  Extremities: Warm/dry, pulses + 2 R/P, trace edema noted BLE, lymphedema left upper extremity    Pressure  Injury 05/22/21 Buttocks Right Deep Tissue Pressure Injury - Purple or maroon localized area of discolored intact skin or blood-filled blister due to damage of underlying soft tissue from pressure and/or shear. (Active)  05/22/21 0300  Location: Buttocks  Location Orientation: Right  Staging: Deep Tissue Pressure Injury - Purple or maroon localized area of discolored intact skin or blood-filled blister due to damage of underlying soft tissue from pressure and/or shear.  Wound Description (Comments):   Present on Admission: Yes     Pressure Injury 05/22/21 Coccyx Stage 2 -  Partial thickness loss of dermis presenting as a shallow open injury with a red, pink wound bed without slough. (Active)  05/22/21 0300  Location: Coccyx  Location Orientation:   Staging: Stage 2 -  Partial thickness loss of dermis presenting as a shallow open injury with a red, pink wound bed without slough.  Wound Description (Comments):   Present on Admission: Yes     Pressure Injury 05/22/21 Cervical Deep Tissue Pressure Injury - Purple or maroon localized area of discolored intact skin or blood-filled blister due to  damage of underlying soft tissue from pressure and/or shear. (Active)  05/22/21 0300  Location: Cervical  Location Orientation:   Staging: Deep Tissue Pressure Injury - Purple or maroon localized area of discolored intact skin or blood-filled blister due to damage of underlying soft tissue from pressure and/or shear.  Wound Description (Comments):   Present on Admission: Yes    Labs/imaging that I havepersonally reviewed  (right click and "Reselect all SmartList Selections" daily)   SYNOPSIS 65 yo admitted for sepsis and suspected infected VP shunt s/p post resp failure resolved with EVD in place, renal failure improving   Assessment & Plan:  Acute Respiratory Failure in the postoperative setting after neurosurgical VA shunt removal with EVD placement  hx: "Bronchial  asthma" -Extubated  10/11 -Wean O2 for saturations of 92% or better. -Poor cough mechanics -Aggressive pulmonary toilet -NTS as needed -Continue bronchodilators -Mild volume overload today, Lasix x1  Rhabdomyolysis secondary to hypovolemia in the setting of dehydration Increased Anion Gap Metabolic Acidosis - resolved Hypernatremia, hyperchloremia-improving  Acute Kidney Injury-improving  Baseline Cr: 0.9, Cr on admission: 2.81, now 1.19 CK: 272 472 3269 - Strict I/O's: alert provider if UOP < 0.5 mL/kg/hr - IVF hydration: D5W continued, will get free water through NG tube - U0 not suggestive of DI continue to monitor - Daily BMP, replace electrolytes PRN - Avoid nephrotoxic agents as able, ensure adequate renal perfusion  Severe sepsis without shock due to infected VA shunt in the setting of shock malfunction - Continue supportive care - Has not required pressors - ID consulted appreciate input~will need linezolid x14 days for GP coverage given skin/soft tissue infection and continue ceftriaxone   VA Shunt Malfunction s/p EVD placement 10/11 Acute encephalopathy~MR Brain 10/14 revealed few scattered small acute infarcts involving different vascular territories  - Patient will need definitive shunt, query transfer to [facility] - Frequent neuro checks - Continue thiamine and correct electrolytes  - Management by neurosurgery appreciate input~Repeat CT Head 10/17 pending  - Neurology consulted for multiple strokes appreciate input    Transaminitis secondary to hypovolemia in the setting of rhabdomyolysis - Trend hepatic function, improving as of 05/25/2021 - Avoid hepatotoxic agents  Posterior Neck, Buttocks & Coccyx Pressure Injuries secondary to being found down at home for unknown amount of time - Q 2 h turns and repositioning as EVD allows - ROM Q4h - WOC consult  Nutrition Status: Nutrition Problem: Inadequate oral intake Etiology: acute illness Signs/Symptoms: NPO status    Best Practice  (right click and "Reselect all SmartList Selections" daily)  Diet/type: NPO, dietitian consult for tube feeds DVT prophylaxis: SCD GI prophylaxis: PPI Lines: Central line, still needed Foley:  Yes, and it is still needed Code Status:  full code  Labs   CBC: Recent Labs  Lab 05/21/21 1905 05/22/21 0342 05/23/21 0430 05/24/21 0403 05/24/21 1446 05/25/21 0517 05/26/21 0406 05/27/21 0541  WBC 30.3*   < > 19.0* 18.2*  --  17.2* 14.5* 12.9*  NEUTROABS 27.1*  --  16.4*  --   --   --   --   --   HGB 15.9*   < > 13.4 13.3 13.2 13.6 11.8* 11.6*  HCT 49.0*   < > 39.5 39.2 39.2 40.4 36.5 35.2*  MCV 96.3   < > 96.1 99.2  --  100.0 101.1* 99.2  PLT 491*   < > 401* 366  --  379 340 314   < > = values in this interval not displayed.     Basic  Metabolic Panel: Recent Labs  Lab 05/23/21 0430 05/24/21 0358 05/25/21 0517 05/25/21 2005 05/26/21 0406 05/26/21 1254 05/26/21 2016 05/27/21 0010 05/27/21 0541  NA 144 148* 151*   < > 146* 140 142 139 138  K 4.2 4.4 4.2  --  3.9  --   --   --  4.4  CL 111 114* 118*  --  112*  --   --   --  104  CO2 22 26 25   --  27  --   --   --  27  GLUCOSE 108* 117* 118*  --  142*  --   --   --  113*  BUN 76* 60* 46*  --  31*  --   --   --  23  CREATININE 1.81* 1.38* 1.19*  --  1.05*  --   --   --  0.88  CALCIUM 8.1* 8.1* 8.1*  --  7.4*  --   --   --  7.5*  MG 2.8* 3.1* 3.1*  --  3.0*  --   --   --  2.4  PHOS 3.8 2.3* 2.8  --  2.4*  --   --   --  3.3   < > = values in this interval not displayed.    GFR: Estimated Creatinine Clearance: 67 mL/min (by C-G formula based on SCr of 0.88 mg/dL). Recent Labs  Lab 05/21/21 2146 05/21/21 2344 05/22/21 0323 05/22/21 0342 05/23/21 0430 05/24/21 0358 05/24/21 0403 05/25/21 0517 05/26/21 0406 05/27/21 0541  PROCALCITON  --   --  1.28  --  1.11 0.47  --   --   --   --   WBC  --   --   --  21.9* 19.0*  --  18.2* 17.2* 14.5* 12.9*  LATICACIDVEN 2.4* 2.2*  --  1.5  --   --   --   --   --   --       Liver Function Tests: Recent Labs  Lab 05/21/21 1905 05/25/21 0517  AST 155* 47*  ALT 59* 41  ALKPHOS 76 58  BILITOT 1.1 1.0  PROT 7.6 6.2*  ALBUMIN 3.6 2.5*    No results for input(s): LIPASE, AMYLASE in the last 168 hours. Recent Labs  Lab 05/21/21 1905  AMMONIA 29     ABG    Component Value Date/Time   PHART 7.45 05/23/2021 0940   PCO2ART 32 05/23/2021 0940   PO2ART 80 (L) 05/23/2021 0940   HCO3 22.2 05/23/2021 0940   ACIDBASEDEF 1.0 05/23/2021 0940   O2SAT 96.3 05/23/2021 0940      Coagulation Profile: No results for input(s): INR, PROTIME in the last 168 hours.  Cardiac Enzymes: Recent Labs  Lab 05/21/21 1905 05/22/21 0342 05/25/21 0517 05/26/21 0406 05/27/21 0541  CKTOTAL 6,755* 5,609* 430* 254* 157     HbA1C: Hgb A1c MFr Bld  Date/Time Value Ref Range Status  05/22/2021 05:20 AM 5.8 (H) 4.8 - 5.6 % Final    Comment:    (NOTE)         Prediabetes: 5.7 - 6.4         Diabetes: >6.4         Glycemic control for adults with diabetes: <7.0     CBG: Recent Labs  Lab 05/26/21 1534 05/26/21 1928 05/26/21 2330 05/27/21 0404 05/27/21 0730  GLUCAP 110* 108* 146* 113* 100*     Allergies No Known Allergies    The patient will likely need  skilled nursing facility.  Transition of care team has been consulted.  Palliative Care has been consulted for [place].  Level 3 follow-up   C. Derrill Kay, MD Advanced Bronchoscopy PCCM [facility]    *This note was dictated using voice recognition software/Dragon.  Despite best efforts to proofread, errors can occur which can change the meaning.  Any change was purely unintentional.

## 2021-05-28 ENCOUNTER — Inpatient Hospital Stay: Payer: Medicare (Managed Care)

## 2021-05-28 DIAGNOSIS — A419 Sepsis, unspecified organism: Secondary | ICD-10-CM

## 2021-05-28 DIAGNOSIS — E8729 Other acidosis: Secondary | ICD-10-CM

## 2021-05-28 DIAGNOSIS — I639 Cerebral infarction, unspecified: Secondary | ICD-10-CM

## 2021-05-28 DIAGNOSIS — G9340 Encephalopathy, unspecified: Secondary | ICD-10-CM | POA: Diagnosis not present

## 2021-05-28 LAB — BASIC METABOLIC PANEL
Anion gap: 8 (ref 5–15)
BUN: 21 mg/dL (ref 8–23)
CO2: 28 mmol/L (ref 22–32)
Calcium: 7.7 mg/dL — ABNORMAL LOW (ref 8.9–10.3)
Chloride: 100 mmol/L (ref 98–111)
Creatinine, Ser: 0.89 mg/dL (ref 0.44–1.00)
GFR, Estimated: >60 mL/min (ref >60)
Glucose, Bld: 148 mg/dL — ABNORMAL HIGH (ref 70–99)
Potassium: 4 mmol/L (ref 3.5–5.1)
Sodium: 136 mmol/L (ref 135–145)

## 2021-05-28 LAB — CBC
HCT: 36.5 % (ref 36.0–46.0)
Hemoglobin: 11.6 g/dL — ABNORMAL LOW (ref 12.0–15.0)
MCH: 31.2 pg (ref 26.0–34.0)
MCHC: 31.8 g/dL (ref 30.0–36.0)
MCV: 98.1 fL (ref 80.0–100.0)
Platelets: 317 10*3/uL (ref 150–400)
RBC: 3.72 MIL/uL — ABNORMAL LOW (ref 3.87–5.11)
RDW: 13.7 % (ref 11.5–15.5)
WBC: 12.8 10*3/uL — ABNORMAL HIGH (ref 4.0–10.5)
nRBC: 0 % (ref 0.0–0.2)

## 2021-05-28 LAB — HEMOGLOBIN A1C
Hgb A1c MFr Bld: 5.8 % — ABNORMAL HIGH (ref 4.8–5.6)
Mean Plasma Glucose: 120 mg/dL

## 2021-05-28 LAB — GLUCOSE, CAPILLARY
Glucose-Capillary: 105 mg/dL — ABNORMAL HIGH (ref 70–99)
Glucose-Capillary: 113 mg/dL — ABNORMAL HIGH (ref 70–99)
Glucose-Capillary: 115 mg/dL — ABNORMAL HIGH (ref 70–99)
Glucose-Capillary: 120 mg/dL — ABNORMAL HIGH (ref 70–99)
Glucose-Capillary: 129 mg/dL — ABNORMAL HIGH (ref 70–99)
Glucose-Capillary: 139 mg/dL — ABNORMAL HIGH (ref 70–99)

## 2021-05-28 LAB — MAGNESIUM: Magnesium: 2.2 mg/dL (ref 1.7–2.4)

## 2021-05-28 LAB — PHOSPHORUS: Phosphorus: 3.3 mg/dL (ref 2.5–4.6)

## 2021-05-28 LAB — CK: Total CK: 136 U/L (ref 38–234)

## 2021-05-28 MED ORDER — FREE WATER
30.0000 mL | Status: DC
  Administered 2021-05-28 – 2021-06-08 (×59): 30 mL

## 2021-05-28 MED ORDER — FREE WATER
50.0000 mL | Status: DC
  Administered 2021-05-28 (×2): 50 mL

## 2021-05-28 NOTE — Progress Notes (Addendum)
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 Pt went to OR to have shunt removed & EVD was placed. Pt remains in the ICU.  She is on tube feeds. Pharmacy consulted for electrolyte management.  Goal of Therapy:  Electrolytes within normal limits  Plan:  Electrolytes stable Will continue to follow along  Tawnya Crook, PharmD, BCPS Clinical Pharmacist 05/28/2021 12:28 PM

## 2021-05-28 NOTE — Progress Notes (Signed)
Attending Progress Note  History: Desiree Mitchell presenting on 05/21/21 with after being found unresponsive by family. She has a history of prior subarachnoid hemorrhage requiring shunt placement. Resulting in neurosurgery consult. CT head showed increased ventricular size concerning for shunt malfunction.  She underwent removal of shunt and placement of EVD on 05/21/21.   POD#6: NAEO  POD #6: decreased CSF output as expected. Stable Clinically  POD#5: Stable CSF output, Patient improved alertness without headache. Increasing level of EVD to 25.  POD#4: MRI showing some watershed type infarcts  POD#3: NAEO  POD#2: patient extubated yesterday but increased O2 requirement overnight   POD#1: Currently off all sedation  Physical Exam: Vitals:   05/28/21 0600 05/28/21 0700  BP: (!) 145/87 (!) 146/93  Pulse: 86 79  Resp: 19 12  Temp:  (!) 97.4 F (36.3 C)  SpO2: 94% 99%    PERRL Opens eyes to voice. Following simple one step commands briskly on the right  Left less brisk. Will wiggle fingers and toes CSF remains clear.   Data:  Recent Labs  Lab 05/26/21 0406 05/26/21 1254 05/27/21 0541 05/27/21 2117 05/28/21 0458  NA 146*   < > 138   < > 136  K 3.9  --  4.4  --  4.0  CL 112*  --  104  --  100  CO2 27  --  27  --  28  BUN 31*  --  23  --  21  CREATININE 1.05*  --  0.88  --  0.89  GLUCOSE 142*  --  113*  --  148*  CALCIUM 7.4*  --  7.5*  --  7.7*   < > = values in this interval not displayed.    Recent Labs  Lab 05/25/21 0517  AST 47*  ALT 41  ALKPHOS 58      Recent Labs  Lab 05/26/21 0406 05/27/21 0541 05/28/21 0458  WBC 14.5* 12.9* 12.8*  HGB 11.8* 11.6* 11.6*  HCT 36.5 35.2* 36.5  PLT 340 314 317    No results for input(s): APTT, INR in the last 168 hours.       Other tests/results:  CT head  IMPRESSION: 1. Interval development of mild hydrocephalus and periventricular edema. Associated malpositioned left VA shunt that terminates in  the anterior mediastinum and no longer within the left brachiocephalic vein as noted on CT miscellaneous 10/03/2011. The shunt is noted to be coiled within the neck. Recommend neurosurgery consultation. 2. No acute displaced fracture or traumatic listhesis of the cervical spine. 3. Ground-glass airspace opacity within the right lower lobe and patchy airspace opacity within left lower lobe. Findings may represent a combination of infection/inflammation as well as atelectasis. 4. No acute traumatic injury to the chest, abdomen, or pelvis with limited evaluation on this noncontrast study.   5. No acute fracture or traumatic malalignment of the thoracic or lumbar spine. 6.  Aortic Atherosclerosis (ICD10-I70.0). 7. Interval development of an enlarged right thyroid gland with limited evaluation due to streak artifact. Recommend thyroid ultrasound (ref: J Am Coll Radiol. 2015 Feb;12(2): 143-50).   These results were called by telephone at the time of interpretation on 05/21/2021 at 9:48 pm to provider [facility] , who verbally acknowledged these results.   Electronically Signed   By: Iven Finn M.D.   On: 05/21/2021 21:54   Narrative & Impression  CLINICAL DATA:  Neuro deficit, acute, stroke suspected   EXAM: MRI HEAD WITHOUT CONTRAST   TECHNIQUE: Multiplanar,  multiecho pulse sequences of the brain and surrounding structures were obtained without intravenous contrast.   COMPARISON:  Correlation made with prior CTs   FINDINGS: Brain: There are a few scattered small foci of reduced diffusion including involvement of the right basal ganglia and corona radiata, left basal ganglia, right inferior parietal cortex, parasagittal left frontal cortex, and anterior right cerebellum.   Left posterior approach ventricular catheter extends to the right of midline traversing the third ventricle with tip extending laterally between frontal horn and third ventricle. Prior right  frontal catheter tract. Ventricles are similar in size to recent CT imaging.   Right anterior temporal encephalomalacia. 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 Chronic infarcts and/or perivascular spaces along the central cerebral white matter and deep gray nuclei. Patchy and confluent areas of T2 hyperintensity in the supratentorial white matter may reflect chronic microvascular ischemic changes. Scattered foci of susceptibility likely reflecting chronic microhemorrhages due to hypertension.   No intracranial mass or mass effect.   Vascular: Major vessel flow voids at the sk few scattered small acute ull base are preserved.   Skull and upper cervical spine: Normal marrow signal is preserved.   Sinuses/Orbits: Paranasal sinuses are aerated. Orbits are unremarkable.   Other: Sella is unremarkable.  Mastoid air cells are clear.   IMPRESSION: Few scattered small acute infarcts involving different vascular territories.   Ventricular catheter present with similar caliber of ventricles as compared to recent CT.   Chronic microvascular ischemic changes, chronic infarcts, and chronic microhemorrhages.     Electronically Signed   By: Macy Mis M.D.   On: 05/25/2021 15:31    Assessment/Plan:  Desiree Mitchell is a 65 y.o presenting to the ER after being found down by her family on 05/21/21. She was found to be septic and head CT was concerning for shunt malfunction.  She underwent removal of VA shunt on 05/21/2021 and placement of EVD.  - EVD in place. Clamped at 9am this morning - continue q1 neuro checks - MRI demonstrated multifocal infarcts, neurology consulted for further management.  - CT 05/28/21 - remainder of management per recommendations of critical care  Cooper Render PA-C Neurosurgery

## 2021-05-28 NOTE — Progress Notes (Signed)
[facility] PROGRESS NOTE Date of Admission:  05/21/2021     ID: Desiree Mitchell is a 65 y.o. female with  bacteremia Principal Problem:   Sepsis ([place]) Active Problems:   Endotracheally intubated   On mechanically assisted ventilation (HCC)   Shunt malfunction   Rhabdomyolysis   AKI (acute kidney injury) ([place])   Increased anion gap metabolic acidosis   Acute encephalopathy   Transaminitis   Pressure injury of skin   Subjective: No fevers, wbc 17.  MRI done  ROS unable to obtain  Medications:  Antibiotics Given (last 72 hours)     Date/Time Action Medication Dose Rate   05/25/21 2120 New Bag/Given   linezolid (ZYVOX) IVPB 600 mg 600 mg 300 mL/hr   05/26/21 1015 New Bag/Given   cefTRIAXone (ROCEPHIN) 2 g in sodium chloride 0.9 % 100 mL IVPB 2 g 200 mL/hr   05/26/21 1134 New Bag/Given   linezolid (ZYVOX) IVPB 600 mg 600 mg 300 mL/hr   05/26/21 2250 New Bag/Given   linezolid (ZYVOX) IVPB 600 mg 600 mg 300 mL/hr   05/27/21 1034 New Bag/Given   cefTRIAXone (ROCEPHIN) 2 g in sodium chloride 0.9 % 100 mL IVPB 2 g 200 mL/hr   05/27/21 1203 New Bag/Given   linezolid (ZYVOX) IVPB 600 mg 600 mg 300 mL/hr   05/27/21 2109 New Bag/Given   linezolid (ZYVOX) IVPB 600 mg 600 mg 300 mL/hr   05/28/21 4650 New Bag/Given   cefTRIAXone (ROCEPHIN) 2 g in sodium chloride 0.9 % 100 mL IVPB 2 g 200 mL/hr   05/28/21 1039 New Bag/Given   linezolid (ZYVOX) IVPB 600 mg 600 mg 300 mL/hr       budesonide (PULMICORT) nebulizer solution  0.5 mg Nebulization BID   chlorhexidine gluconate (MEDLINE KIT)  15 mL Mouth Rinse BID   Chlorhexidine Gluconate Cloth  6 each Topical Daily   collagenase   Topical Daily   cyanocobalamin  1,000 mcg Intramuscular Q0600   enoxaparin (LOVENOX) injection  40 mg Subcutaneous Q24H   free water  50 mL Per Tube Q4H   Gerhardt's butt cream   Topical QID   ipratropium-albuterol  3 mL Nebulization Q6H   pantoprazole (PROTONIX) IV  40 mg Intravenous  QHS   sodium chloride flush  10-40 mL Intracatheter Q12H   thiamine injection  100 mg Intravenous Daily    Objective: Vital signs in last 24 hours: Temp:  [97.4 F (36.3 C)-98.4 F (36.9 C)] 98.4 F (36.9 C) (10/17 1200) Pulse Rate:  [79-128] 88 (10/17 1300) Resp:  [12-33] 16 (10/17 1300) BP: (138-182)/(71-122) 145/80 (10/17 1300) SpO2:  [92 %-100 %] 98 % (10/17 1300) Weight:  [85.5 kg] 85.5 kg (10/17 0500) Constitutional:  ill appearing, slowed mentation but eyes open and able to talk slowly and answer simple questions HENT:  no scleral icterus Mouth/Throat: Oropharynx is dry Scalp with EVD in place.  Neck with large area of skin breakdown with rednes.  Cardiovascular: Normal rate, regular rhythm and normal heart sounds. .  Portacath in R chest wall is accessed- no redness Pulmonary/Chest: rhonchi bil   Neck = supple, no nuchal rigidity Abdominal: Soft. Bowel sounds are normal.  exhibits no distension. There is no tenderness.  Lymphadenopathy: no cervical adenopathy. No axillary adenopathy Neurological: more alert today today but slowed mentatio Skin: see photos, multiple ares of skin bruising and skin breakdown Psychiatric: min responsive  Lab Results Recent Labs    05/27/21 0541 05/27/21 2117 05/28/21 0458  WBC 12.9*  --  12.8*  HGB 11.6*  --  11.6*  HCT 35.2*  --  36.5  NA 138 135 136  K 4.4  --  4.0  CL 104  --  100  CO2 27  --  28  BUN 23  --  21  CREATININE 0.88  --  0.89    Microbiology: Results for orders placed or performed during the hospital encounter of 05/21/21  Blood culture (routine x 2)     Status: Abnormal   Collection Time: 05/21/21  7:05 PM   Specimen: BLOOD  Result Value Ref Range Status   Specimen Description   Final    BLOOD LEFT ANTECUBITAL Performed at [facility], [address], [place], [place] [address]    Special Requests   Final    BOTTLES DRAWN AEROBIC AND ANAEROBIC Blood Culture adequate volume Performed at  [facility], [address] [place], [place] [address]    Culture  Setup Time   Final    IN BOTH AEROBIC AND ANAEROBIC BOTTLES GRAM POSITIVE COCCI GRAM NEGATIVE RODS Organism ID to follow CRITICAL RESULT CALLED TO, READ BACK BY AND VERIFIED WITH: CARISSA [facility] AT 2054 05/22/21 BY [facility] Performed at [facility], [address], [place], [place] [address]    Culture PROTEUS PENNERI STAPHYLOCOCCUS EPIDERMIDIS  (A)  Final   Report Status 05/27/2021 FINAL  Final   Organism ID, Bacteria PROTEUS PENNERI  Final   Organism ID, Bacteria STAPHYLOCOCCUS EPIDERMIDIS  Final      Susceptibility   Proteus penneri - MIC*    AMPICILLIN >=32 RESISTANT Resistant     CEFAZOLIN >=64 RESISTANT Resistant     CEFEPIME <=0.12 SENSITIVE Sensitive     CEFTAZIDIME <=1 SENSITIVE Sensitive     CEFTRIAXONE <=0.25 SENSITIVE Sensitive     CIPROFLOXACIN <=0.25 SENSITIVE Sensitive     GENTAMICIN <=1 SENSITIVE Sensitive     IMIPENEM 2 SENSITIVE Sensitive     TRIMETH/SULFA <=20 SENSITIVE Sensitive     AMPICILLIN/SULBACTAM 16 INTERMEDIATE Intermediate     PIP/TAZO <=4 SENSITIVE Sensitive     * PROTEUS PENNERI   Staphylococcus epidermidis - MIC*    CIPROFLOXACIN <=0.5 SENSITIVE Sensitive     ERYTHROMYCIN <=0.25 SENSITIVE Sensitive     GENTAMICIN <=0.5 SENSITIVE Sensitive     OXACILLIN <=0.25 SENSITIVE Sensitive     TETRACYCLINE 2 SENSITIVE Sensitive     VANCOMYCIN 2 SENSITIVE Sensitive     TRIMETH/SULFA <=10 SENSITIVE Sensitive     CLINDAMYCIN <=0.25 SENSITIVE Sensitive     RIFAMPIN <=0.5 SENSITIVE Sensitive     Inducible Clindamycin NEGATIVE Sensitive     * STAPHYLOCOCCUS EPIDERMIDIS  Blood Culture ID Panel (Reflexed)     Status: Abnormal   Collection Time: 05/21/21  7:05 PM  Result Value Ref Range Status   Enterococcus faecalis NOT DETECTED NOT DETECTED Final   Enterococcus Faecium NOT DETECTED NOT DETECTED Final   Listeria monocytogenes NOT DETECTED NOT DETECTED Final   Staphylococcus  species DETECTED (A) NOT DETECTED Final    Comment: READ BACK AND VERIFIED BY CARISSA DOLAN AT 2054 05/22/21 BY [facility]   Staphylococcus aureus (BCID) NOT DETECTED NOT DETECTED Final   Staphylococcus epidermidis DETECTED (A) NOT DETECTED Final    Comment: READ BACK AND VERIFIED BY CARRISA DOLAN, AT 2045 05/22/21 BY [facility]   Staphylococcus lugdunensis NOT DETECTED NOT DETECTED Final   Streptococcus species NOT DETECTED NOT DETECTED Final   Streptococcus agalactiae NOT DETECTED NOT DETECTED Final   Streptococcus pneumoniae NOT DETECTED  NOT DETECTED Final   Streptococcus pyogenes NOT DETECTED NOT DETECTED Final   A.calcoaceticus-baumannii NOT DETECTED NOT DETECTED Final   Bacteroides fragilis NOT DETECTED NOT DETECTED Final   Enterobacterales NOT DETECTED NOT DETECTED Final   Enterobacter cloacae complex NOT DETECTED NOT DETECTED Final   Escherichia coli NOT DETECTED NOT DETECTED Final   Klebsiella aerogenes NOT DETECTED NOT DETECTED Final   Klebsiella oxytoca NOT DETECTED NOT DETECTED Final   Klebsiella pneumoniae NOT DETECTED NOT DETECTED Final   Proteus species NOT DETECTED NOT DETECTED Final   Salmonella species NOT DETECTED NOT DETECTED Final   Serratia marcescens NOT DETECTED NOT DETECTED Final   Haemophilus influenzae NOT DETECTED NOT DETECTED Final   Neisseria meningitidis NOT DETECTED NOT DETECTED Final   Pseudomonas aeruginosa NOT DETECTED NOT DETECTED Final   Stenotrophomonas maltophilia NOT DETECTED NOT DETECTED Final   Candida albicans NOT DETECTED NOT DETECTED Final   Candida auris NOT DETECTED NOT DETECTED Final   Candida glabrata NOT DETECTED NOT DETECTED Final   Candida krusei NOT DETECTED NOT DETECTED Final   Candida parapsilosis NOT DETECTED NOT DETECTED Final   Candida tropicalis NOT DETECTED NOT DETECTED Final   Cryptococcus neoformans/gattii NOT DETECTED NOT DETECTED Final   Methicillin resistance mecA/C NOT DETECTED NOT DETECTED Final    Comment: Performed at [facility], [address] [place], [place] [address]  Blood Culture ID Panel (Reflexed)     Status: Abnormal   Collection Time: 05/21/21  7:05 PM  Result Value Ref Range Status   Enterococcus faecalis NOT DETECTED NOT DETECTED Final   Enterococcus Faecium NOT DETECTED NOT DETECTED Final   Listeria monocytogenes NOT DETECTED NOT DETECTED Final   Staphylococcus species DETECTED (A) NOT DETECTED Final    Comment: CRITICAL RESULT CALLED TO, READ BACK BY AND VERIFIED WITH: JASON ROBBINS PHARMD 0522 05/23/21 [facility]    Staphylococcus aureus (BCID) NOT DETECTED NOT DETECTED Final   Staphylococcus epidermidis NOT DETECTED NOT DETECTED Final   Staphylococcus lugdunensis NOT DETECTED NOT DETECTED Final   Streptococcus species NOT DETECTED NOT DETECTED Final   Streptococcus agalactiae NOT DETECTED NOT DETECTED Final   Streptococcus pneumoniae NOT DETECTED NOT DETECTED Final   Streptococcus pyogenes NOT DETECTED NOT DETECTED Final   A.calcoaceticus-baumannii NOT DETECTED NOT DETECTED Final   Bacteroides fragilis NOT DETECTED NOT DETECTED Final   Enterobacterales DETECTED (A) NOT DETECTED Final    Comment: Enterobacterales represent a large order of gram negative bacteria, not a single organism. CRITICAL RESULT CALLED TO, READ BACK BY AND VERIFIED WITH: JASON ROBBINS PHARMD 0522 05/23/21 HNM    Enterobacter cloacae complex NOT DETECTED NOT DETECTED Final   Escherichia coli NOT DETECTED NOT DETECTED Final   Klebsiella aerogenes NOT DETECTED NOT DETECTED Final   Klebsiella oxytoca NOT DETECTED NOT DETECTED Final   Klebsiella pneumoniae NOT DETECTED NOT DETECTED Final   Proteus species DETECTED (A) NOT DETECTED Final    Comment: CRITICAL RESULT CALLED TO, READ BACK BY AND VERIFIED WITH: JASON ROBBINS PHARMD 0522 05/23/21 HNM    Salmonella species NOT DETECTED NOT DETECTED Final   Serratia marcescens NOT DETECTED NOT DETECTED Final   Haemophilus influenzae NOT DETECTED NOT DETECTED Final    Neisseria meningitidis NOT DETECTED NOT DETECTED Final   Pseudomonas aeruginosa NOT DETECTED NOT DETECTED Final   Stenotrophomonas maltophilia NOT DETECTED NOT DETECTED Final   Candida albicans NOT DETECTED NOT DETECTED Final   Candida auris NOT DETECTED NOT DETECTED Final   Candida glabrata NOT DETECTED NOT DETECTED Final  Candida krusei NOT DETECTED NOT DETECTED Final   Candida parapsilosis NOT DETECTED NOT DETECTED Final   Candida tropicalis NOT DETECTED NOT DETECTED Final   Cryptococcus neoformans/gattii NOT DETECTED NOT DETECTED Final   CTX-M ESBL NOT DETECTED NOT DETECTED Final   Carbapenem resistance IMP NOT DETECTED NOT DETECTED Final   Carbapenem resistance KPC NOT DETECTED NOT DETECTED Final   Carbapenem resistance NDM NOT DETECTED NOT DETECTED Final   Carbapenem resist OXA 48 LIKE NOT DETECTED NOT DETECTED Final   Carbapenem resistance VIM NOT DETECTED NOT DETECTED Final    Comment: Performed at [facility], [address] [place], [place] [address]  Resp Panel by RT-PCR (Flu A&B, Covid)     Status: None   Collection Time: 05/21/21  9:46 PM   Specimen: Nasopharyngeal(NP) swabs in vial transport medium  Result Value Ref Range Status   SARS Coronavirus 2 by RT PCR NEGATIVE NEGATIVE Final    Comment: (NOTE) SARS-CoV-2 target nucleic acids are NOT DETECTED.  The SARS-CoV-2 RNA is generally detectable in upper respiratory specimens during the acute phase of infection. The lowest concentration of SARS-CoV-2 viral copies this assay can detect is 138 copies/mL. A negative result does not preclude SARS-Cov-2 infection and should not be used as the sole basis for treatment or other patient management decisions. A negative result may occur with  improper specimen collection/handling, submission of specimen other than nasopharyngeal swab, presence of viral mutation(s) within the areas targeted by this assay, and inadequate number of viral copies(<138 copies/mL). A  negative result must be combined with clinical observations, patient history, and epidemiological information. The expected result is Negative.  Fact Sheet for Patients:  EntrepreneurPulse.com.au  Fact Sheet for Healthcare Providers:  IncredibleEmployment.be  This test is no t yet approved or cleared by the [place] FDA and  has been authorized for detection and/or diagnosis of SARS-CoV-2 by FDA under an Emergency Use Authorization (EUA). This EUA will remain  in effect (meaning this test can be used) for the duration of the COVID-19 declaration under Section 564(b)(1) of the Act, 21 U.S.C.section 360bbb-3(b)(1), unless the authorization is terminated  or revoked sooner.       Influenza A by PCR NEGATIVE NEGATIVE Final   Influenza B by PCR NEGATIVE NEGATIVE Final    Comment: (NOTE) The Xpert Xpress SARS-CoV-2/FLU/RSV plus assay is intended as an aid in the diagnosis of influenza from Nasopharyngeal swab specimens and should not be used as a sole basis for treatment. Nasal washings and aspirates are unacceptable for Xpert Xpress SARS-CoV-2/FLU/RSV testing.  Fact Sheet for Patients: EntrepreneurPulse.com.au  Fact Sheet for Healthcare Providers: IncredibleEmployment.be  This test is not yet approved or cleared by the [place] FDA and has been authorized for detection and/or diagnosis of SARS-CoV-2 by FDA under an Emergency Use Authorization (EUA). This EUA will remain in effect (meaning this test can be used) for the duration of the COVID-19 declaration under Section 564(b)(1) of the Act, 21 U.S.C. section 360bbb-3(b)(1), unless the authorization is terminated or revoked.  Performed at [facility], [place], [place] [address]   Aerobic/Anaerobic Culture w Gram Stain (surgical/deep wound)     Status: None   Collection Time: 05/22/21  1:24 AM   Specimen: CSF; Body Fluid   Result Value Ref Range Status   Specimen Description   Final    CSF Performed at [facility], [address], [place],  [address]    Special Requests   Final  NONE Performed at [facility], [place], [place] [address]    Gram Stain NO WBC SEEN NO ORGANISMS SEEN   Final   Culture   Final    No growth aerobically or anaerobically. Performed at [facility], [place] [address] [place], [place] [address]    Report Status 05/27/2021 FINAL  Final  Culture, blood (Routine X 2) w Reflex to ID Panel     Status: None   Collection Time: 05/22/21  3:02 AM   Specimen: BLOOD  Result Value Ref Range Status   Specimen Description BLOOD RIGHT HAND  Final   Special Requests   Final    BOTTLES DRAWN AEROBIC ONLY Blood Culture results may not be optimal due to an inadequate volume of blood received in culture bottles   Culture   Final    NO GROWTH 5 DAYS Performed at [facility], [address] [place], [place] [address]    Report Status 05/27/2021 FINAL  Final    Studies/Results: CT HEAD WO CONTRAST (5MM)  Result Date: 05/28/2021 CLINICAL DATA:  Hydrocephalus.  Found unresponsive. EXAM: CT HEAD WITHOUT CONTRAST TECHNIQUE: Contiguous axial images were obtained from the base of the skull through the vertex without intravenous contrast. COMPARISON:  Head CT 05/24/2021.  MRI 05/25/2021. FINDINGS: Brain: No appreciable change since the CT of 4 days ago. VP shunt enters from a left parietal approach position of the catheter is unchanged and ventricular size is unchanged. Catheter appears to communicate with both the occipital horn of the left lateral ventricle and the third ventricle. Extensive chronic low-density seen throughout the cerebral hemispheric white matter. 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 Subacute infarction in the right basal ganglia appears similar. No sign of new  infarction, mass lesion, hemorrhage or extra-axial collection. Vascular: There is atherosclerotic calcification of the major vessels at the base of the brain. Skull: Negative except for previous burr holes. Sinuses/Orbits: Clear/normal Other: None IMPRESSION: Stable CT study. VP shunt in place with stable ventricular size. Extensive chronic ischemic changes as described above. Subacute infarction in the right basal ganglia/radiating white matter tracts as seen previously. Electronically Signed   By: Nelson Chimes M.D.   On: 05/28/2021 12:48   DG Abd Portable 1V  Result Date: 05/28/2021 CLINICAL DATA:  NG tube placement EXAM: PORTABLE ABDOMEN - 1 VIEW COMPARISON:  05/26/2021 FINDINGS: Limited radiograph of the lower chest and upper abdomen was obtained for the purposes of enteric tube localization. Enteric tube is seen coursing below the diaphragm with distal tip and side port terminating within the expected location of the gastric body. IMPRESSION: Enteric tube tip and side port are seen within the gastric body. Electronically Signed   By: Davina Poke D.O.   On: 05/28/2021 10:53   DG Abd Portable 1V  Result Date: 05/26/2021 CLINICAL DATA:  Check gastric catheter placement EXAM: PORTABLE ABDOMEN - 1 VIEW COMPARISON:  05/22/2021 FINDINGS: Gastric catheter is noted within the stomach. It is less deep than that seen on the prior exam. No obstructive changes are seen. IMPRESSION: Gastric catheter within the stomach. Electronically Signed   By: Inez Catalina M.D.   On: 05/26/2021 19:00   ECHOCARDIOGRAM COMPLETE  Result Date: 05/27/2021    ECHOCARDIOGRAM REPORT   Patient Name:   MCKINSLEY Chisolm Date of Exam: 05/26/2021 Medical Rec #:  [id number]     Height:       62.0 in Accession #:  [id number]    Weight:       192.0 lb Date of Birth:  1955/09/07     BSA:          1.879 m Patient Age:    60 years      BP:           155/75 mmHg Patient Gender: F             HR:           92 bpm. Exam Location:  [facility]  Procedure: 2D Echo, Cardiac Doppler and Color Doppler Indications:     Stroke I63.9  History:         Patient has no prior history of Echocardiogram examinations.  Sonographer:     Alyse Low Roar Referring Phys:  [id number] Bradly Bienenstock Diagnosing Phys: Isaias Cowman MD IMPRESSIONS  1. Left ventricular ejection fraction, by estimation, is 50 to 55%. The left ventricle has low normal function. The left ventricle has no regional wall motion abnormalities. Left ventricular diastolic parameters are consistent with Grade I diastolic dysfunction (impaired relaxation).  2. Right ventricular systolic function is normal. The right ventricular size is normal.  3. The mitral valve is normal in structure. Trivial mitral valve regurgitation. No evidence of mitral stenosis.  4. The aortic valve is normal in structure. Aortic valve regurgitation is not visualized. No aortic stenosis is present.  5. The inferior vena cava is normal in size with greater than 50% respiratory variability, suggesting right atrial pressure of 3 mmHg. FINDINGS  Left Ventricle: Left ventricular ejection fraction, by estimation, is 50 to 55%. The left ventricle has low normal function. The left ventricle has no regional wall motion abnormalities. The left ventricular internal cavity size was normal in size. There is no left ventricular hypertrophy. Left ventricular diastolic parameters are consistent with Grade I diastolic dysfunction (impaired relaxation). Right Ventricle: The right ventricular size is normal. No increase in right ventricular wall thickness. Right ventricular systolic function is normal. Left Atrium: Left atrial size was normal in size. Right Atrium: Right atrial size was normal in size. Pericardium: There is no evidence of pericardial effusion. Mitral Valve: The mitral valve is normal in structure. Trivial mitral valve regurgitation. No evidence of mitral valve stenosis. Tricuspid Valve: The tricuspid valve is normal in structure.  Tricuspid valve regurgitation is trivial. No evidence of tricuspid stenosis. Aortic Valve: The aortic valve is normal in structure. Aortic valve regurgitation is not visualized. No aortic stenosis is present. Aortic valve peak gradient measures 5.5 mmHg. Pulmonic Valve: The pulmonic valve was normal in structure. Pulmonic valve regurgitation is not visualized. No evidence of pulmonic stenosis. Aorta: The aortic root is normal in size and structure. Venous: The inferior vena cava is normal in size with greater than 50% respiratory variability, suggesting right atrial pressure of 3 mmHg. IAS/Shunts: No atrial level shunt detected by color flow Doppler.  LEFT VENTRICLE PLAX 2D LVIDd:         4.73 cm   Diastology LVIDs:         3.55 cm   LV e' medial:    4.03 cm/s LV PW:         0.94 cm   LV E/e' medial:  14.7 LV IVS:        1.26 cm   LV e' lateral:   5.66 cm/s LVOT diam:     1.80 cm   LV E/e' lateral: 10.4 LVOT Area:     2.54 cm  RIGHT VENTRICLE  RV Mid diam:    2.42 cm RV S prime:     16.00 cm/s TAPSE (M-mode): 2.4 cm LEFT ATRIUM             Index        RIGHT ATRIUM           Index LA diam:        3.60 cm 1.92 cm/m   RA Area:     13.20 cm LA Vol (A2C):   44.7 ml 23.79 ml/m  RA Volume:   27.20 ml  14.48 ml/m LA Vol (A4C):   35.5 ml 18.89 ml/m LA Biplane Vol: 40.3 ml 21.45 ml/m  AORTIC VALVE                 PULMONIC VALVE AV Area (Vmax): 2.04 cm     PV Vmax:        0.77 m/s AV Vmax:        117.00 cm/s  PV Peak grad:   2.4 mmHg AV Peak Grad:   5.5 mmHg     RVOT Peak grad: 2 mmHg LVOT Vmax:      93.80 cm/s  AORTA Ao Root diam: 2.70 cm MITRAL VALVE MV Area (PHT): 3.34 cm     SHUNTS MV Decel Time: 227 msec     Systemic Diam: 1.80 cm MV E velocity: 59.10 cm/s MV A velocity: 114.00 cm/s MV E/A ratio:  0.52 MV A Prime:    10.4 cm/s Isaias Cowman MD Electronically signed by Isaias Cowman MD Signature Date/Time: 05/27/2021/10:12:01 AM    Final     Assessment/Plan: Desiree Mitchell is a 65 y.o. 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 She had extensive skin bruising and also a wound on post neck where her head had rested against a basket. Also has multiple skin lesions and was apparently infested with bedbugs and cockroaches on admission.  She was found to have malfunctioning of her VA shunt with the distal end coiled in the neck.  She underwent removal of her VA shunt as well as placement of external ventricular drain.  She was found to have bacteremia with 1 set of cultures growing Proteus as well as staph species and the set of blood cultures other growing staph epidermidis.  She does have a Port-A-Cath in place.   I suspect her bacteremia is from her skin on her legs as well as newer lesions on her being down especially infected appearing wound on the posterior neck.  No evidence that her VP shunt was infected apparently.  Cultures are negative.  She has had CT of her chest abdomen and pelvis.  Only finding was some mild atelectasis. She has been on cefepime and has received 1 dose of Vanco Follow-up blood cultures 10/11 no growth to date COVID.   10/14- no fevers,   October 17-no fevers.  White count down to 12.  Remains in the unit.  CSF cultures negative.  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  She has been seen by neurology.  TEE is planned. Recommendations Cont linezolid x 14 days planned course for GP coverage given skin and soft tissue infection. This should also cover the staph epi Cont ceftriaxone for proteus TEE pending Continue wound care. Hopefully her Port-A-Cath has not been infected and will not need to be removed but we will have to continue to monitor this.  We can hopefully treat her  with IV antibiotics.  Thank you very much for the consult. Will follow with you.  Leonel Ramsay   05/28/2021, 1:57 PM

## 2021-05-28 NOTE — Progress Notes (Signed)
NAME:  Desiree Mitchell, MRN:  [id number], DOB:  02/19/1956, LOS: 6 ADMISSION DATE:  05/21/2021  65 year old female presented at [facility] ED on 05/21/2021 from home via EMS after being found unresponsive by family.  Patient was last seen normal on Saturday, 05/19/2021 at 4 PM.  On EMS arrival patient was unresponsive and hypoxic with SPO2 in the 70s and a normal blood sugar, EMS had difficulty getting a blood pressure.  An IO was placed in the right humeral head and the patient received 300 mL of LR, 40 mg of lidocaine and 25 mg of Phenergan.  Family later reported to EDP that they found her sitting on a toilet slumped with her neck up against a basket. Of note patient arrived infested with bed bugs and roaches.  Significant labs: On admission:  Chemistry: Na+: 137, K+: 4.8, Cl: 97, BUN/Cr.:  84/2.81, Serum CO2/ AG: 17/23, AST: 155, ALT: 59, CK: 6755 Hematology: WBC: 30.3, Hgb: 15.9, Troponin: 22, Lactic/ PCT: 2.4 > 2.2/ pending, COVID-19 & Influenza A/B: negative VBG: 7.21/53/32/21.2 CXR 05/21/2021: Mild left basilar atelectasis without evidence of an acute infiltrate  Significant Hospital Events: Including procedures, antibiotic start and stop dates in addition to other pertinent events    10/10: Admitted or severe sepsis from infected Vp shunt 10/11: Removal of complete ventriculoatrial shunt and placement of left parietal approach ventriculostomy 10/11 Extubated successfully 10/12 SD status 10/13 Remains ICU status due to neuro checks 10/14 Remains in ICU due to need for neurochecks and external ventricular drain 10/15: Per neurosurg EVD drain adjusted to 25 cm H20 10/16: Neurology evaluation for multiple infarcts, EVD remains 10/17 remains with EVD  Significant Test:   CT Head without contrast 05/21/2021: Interval development of mild hydrocephalus and periventricular edema, associated malpositioned left VA shunt that terminates in the anterior mediastinum and no longer within the left  brachiocephalic vein.  Shunt noted to be coiled within the neck.  Groundglass opacity within the right lower lobe and patchy airspace opacity within the left lower lobe. CT Cervical Spine 05/21/2021: No acute fracture or traumatic injury noted.  Interval development of enlarged right thyroid gland. MR Brain 05/25/2021: Few scattered small acute infarcts involving different vascular territories. Ventricular catheter present with similar caliber of ventricles as compared to recent CT. Chronic microvascular ischemic changes, chronic infarcts, and chronic  microhemorrhages.  Micro Data:  10/10: COVID NEG 10/11: HIV NEG 10/10 BCID - proteus spp in 1of4 & staph spp in 2of4 (no resistances detected),  BCID 1of4 MSSE 10/10 BCx: GPCs & GNRs (pending speciation) 10/11 BCx:  10/11: CSF-NGTD   10/10: FLU/COV - negative  Antimicrobials:   Antibiotics Given (last 72 hours)     Date/Time Action Medication Dose Rate   05/25/21 1007 New Bag/Given   cefTRIAXone (ROCEPHIN) 2 g in sodium chloride 0.9 % 100 mL IVPB 2 g 200 mL/hr   05/25/21 1115 New Bag/Given   linezolid (ZYVOX) IVPB 600 mg 600 mg 300 mL/hr   05/25/21 2120 New Bag/Given   linezolid (ZYVOX) IVPB 600 mg 600 mg 300 mL/hr   05/26/21 1015 New Bag/Given   cefTRIAXone (ROCEPHIN) 2 g in sodium chloride 0.9 % 100 mL IVPB 2 g 200 mL/hr   05/26/21 1134 New Bag/Given   linezolid (ZYVOX) IVPB 600 mg 600 mg 300 mL/hr   05/26/21 2250 New Bag/Given   linezolid (ZYVOX) IVPB 600 mg 600 mg 300 mL/hr   05/27/21 1034 New Bag/Given   cefTRIAXone (ROCEPHIN) 2 g in sodium chloride 0.9 % 100 mL  IVPB 2 g 200 mL/hr   05/27/21 1203 New Bag/Given   linezolid (ZYVOX) IVPB 600 mg 600 mg 300 mL/hr   05/27/21 2109 New Bag/Given   linezolid (ZYVOX) IVPB 600 mg 600 mg 300 mL/hr      Scheduled Meds:  budesonide (PULMICORT) nebulizer solution  0.5 mg Nebulization BID   chlorhexidine gluconate (MEDLINE KIT)  15 mL Mouth Rinse BID   Chlorhexidine Gluconate Cloth  6  each Topical Daily   collagenase   Topical Daily   cyanocobalamin  1,000 mcg Intramuscular Q0600   enoxaparin (LOVENOX) injection  40 mg Subcutaneous Q24H   free water  30 mL Per Tube Q4H   Gerhardt's butt cream   Topical QID   ipratropium-albuterol  3 mL Nebulization Q6H   pantoprazole (PROTONIX) IV  40 mg Intravenous QHS   sodium chloride flush  10-40 mL Intracatheter Q12H   thiamine injection  100 mg Intravenous Daily   Continuous Infusions:  cefTRIAXone (ROCEPHIN)  IV Stopped (05/27/21 1112)   feeding supplement (OSMOLITE 1.2 CAL) 1,000 mL (05/28/21 0024)   linezolid (ZYVOX) IV 600 mg (05/27/21 2109)   PRN Meds:.albuterol, docusate sodium, labetalol, polyethylene glycol, sodium chloride flush    Interim History / Subjective:  EVD adjusted to 25 cm H20 per neurosurgery yesterday, no input of neurosurgery today. More alert, lethargic family  Objective   Blood pressure (!) 146/93, pulse 79, temperature (!) 97.4 F (36.3 C), temperature source Oral, resp. rate 12, height 5' 2"  (1.575 m), weight 85.5 kg, SpO2 99 %.        Intake/Output Summary (Last 24 hours) at 05/28/2021 0736 Last data filed at 05/28/2021 0600 Gross per 24 hour  Intake 1770.4 ml  Output 256 ml  Net 1514.4 ml    Filed Weights   05/25/21 0330 05/26/21 0320 05/28/21 0500  Weight: 85 kg 87.1 kg 85.5 kg   REVIEW OF SYSTEMS  PATIENT IS UNABLE TO PROVIDE COMPLETE REVIEW OF SYSTEMS DUE TO SEVERE CRITICAL ILLNESS AND TOXIC METABOLIC ENCEPHALOPATHY  PHYSICAL EXAMINATION:  GENERAL:critically ill appearing,  EYES: Pupils equal, round, reactive to light.  No scleral icterus.  MOUTH: Moist mucosal membrane.  NECK: Supple.  PULMONARY: +rhonchi,  CARDIOVASCULAR: S1 and S2.  No murmurs  GASTROINTESTINAL: Soft, nontender, -distended. Positive bowel sounds.  MUSCULOSKELETAL: No swelling, clubbing, or edema.  NEUROLOGIC: lethargic but arousable SKIN:intact,warm,dry, drain in place    Pressure Injury 05/22/21  Buttocks Right Deep Tissue Pressure Injury - Purple or maroon localized area of discolored intact skin or blood-filled blister due to damage of underlying soft tissue from pressure and/or shear. (Active)  05/22/21 0300  Location: Buttocks  Location Orientation: Right  Staging: Deep Tissue Pressure Injury - Purple or maroon localized area of discolored intact skin or blood-filled blister due to damage of underlying soft tissue from pressure and/or shear.  Wound Description (Comments):   Present on Admission: Yes     Pressure Injury 05/22/21 Coccyx Stage 2 -  Partial thickness loss of dermis presenting as a shallow open injury with a red, pink wound bed without slough. (Active)  05/22/21 0300  Location: Coccyx  Location Orientation:   Staging: Stage 2 -  Partial thickness loss of dermis presenting as a shallow open injury with a red, pink wound bed without slough.  Wound Description (Comments):   Present on Admission: Yes     Pressure Injury 05/22/21 Cervical Deep Tissue Pressure Injury - Purple or maroon localized area of discolored intact skin or blood-filled blister due to damage  of underlying soft tissue from pressure and/or shear. (Active)  05/22/21 0300  Location: Cervical  Location Orientation:   Staging: Deep Tissue Pressure Injury - Purple or maroon localized area of discolored intact skin or blood-filled blister due to damage of underlying soft tissue from pressure and/or shear.  Wound Description (Comments):   Present on Admission: Yes    Labs/imaging that I havepersonally reviewed  (right click and "Reselect all SmartList Selections" daily)   SYNOPSIS 65 yo admitted for sepsis and  infected VP shunt s/p post resp failure resolved with EVD in place, renal failure improving   Assessment & Plan:  Acute Respiratory Failure in the postoperative setting after neurosurgical VA shunt removal with EVD placement  hx: "Bronchial  asthma" Extubated Wean fio2 Pulmonary hygene BD  therapy Lasix as needed -Continue bronchodilators High risk for intubation and aspiration  Rhabdomyolysis secondary to hypovolemia in the setting of dehydration Increased Anion Gap Metabolic Acidosis - resolved Hypernatremia, hyperchloremia-improving  Acute Kidney Injury-improving  ACUTE KIDNEY INJURY/Renal Failure -continue Foley Catheter-assess need -Avoid nephrotoxic agents -Follow urine output, BMP -Ensure adequate renal perfusion, optimize oxygenation -Renal dose medications   Intake/Output Summary (Last 24 hours) at 05/28/2021 0741 Last data filed at 05/28/2021 0600 Gross per 24 hour  Intake 1770.4 ml  Output 256 ml  Net 1514.4 ml     Severe sepsis without shock due to infected VA shunt in the setting of shock malfunction SOURCE-infected VP shunt -use vasopressors to keep MAP>65 as needed -follow ABG and LA as needed -follow up cultures - ID consulted appreciate input~will need linezolid x14 days for GP coverage given skin/soft tissue infection and continue ceftriaxone   VA Shunt Malfunction s/p EVD placement 10/11 Acute encephalopathy~MR Brain 10/14 revealed few scattered small acute infarcts involving different vascular territories  - Management by neurosurgery appreciate input~Repeat CT Head 10/17 pending  - Neurology consulted for multiple strokes appreciate input    Posterior Neck, Buttocks & Coccyx Pressure Injuries secondary to being found down at home for unknown amount of time - WOC consult  Nutrition Status: Nutrition Problem: Inadequate oral intake Etiology: acute illness Signs/Symptoms: NPO status    Best Practice (right click and "Reselect all SmartList Selections" daily)  Diet/type: NPO, dietitian consult for tube feeds DVT prophylaxis: SCD GI prophylaxis: PPI Lines: Central line, still needed Foley:  Yes, and it is still needed Code Status:  full code  Labs   CBC: Recent Labs  Lab 05/21/21 1905 05/22/21 0342 05/23/21 0430  05/24/21 0403 05/24/21 1446 05/25/21 0517 05/26/21 0406 05/27/21 0541 05/28/21 0458  WBC 30.3*   < > 19.0* 18.2*  --  17.2* 14.5* 12.9* 12.8*  NEUTROABS 27.1*  --  16.4*  --   --   --   --   --   --   HGB 15.9*   < > 13.4 13.3 13.2 13.6 11.8* 11.6* 11.6*  HCT 49.0*   < > 39.5 39.2 39.2 40.4 36.5 35.2* 36.5  MCV 96.3   < > 96.1 99.2  --  100.0 101.1* 99.2 98.1  PLT 491*   < > 401* 366  --  379 340 314 317   < > = values in this interval not displayed.     Basic Metabolic Panel: Recent Labs  Lab 05/24/21 0358 05/25/21 0517 05/25/21 2005 05/26/21 0406 05/26/21 1254 05/26/21 2016 05/27/21 0010 05/27/21 0541 05/27/21 2117 05/28/21 0458  NA 148* 151*   < > 146*   < > 142 139 138 135 136  K 4.4 4.2  --  3.9  --   --   --  4.4  --  4.0  CL 114* 118*  --  112*  --   --   --  104  --  100  CO2 26 25  --  27  --   --   --  27  --  28  GLUCOSE 117* 118*  --  142*  --   --   --  113*  --  148*  BUN 60* 46*  --  31*  --   --   --  23  --  21  CREATININE 1.38* 1.19*  --  1.05*  --   --   --  0.88  --  0.89  CALCIUM 8.1* 8.1*  --  7.4*  --   --   --  7.5*  --  7.7*  MG 3.1* 3.1*  --  3.0*  --   --   --  2.4  --  2.2  PHOS 2.3* 2.8  --  2.4*  --   --   --  3.3  --  3.3   < > = values in this interval not displayed.    GFR: Estimated Creatinine Clearance: 65.7 mL/min (by C-G formula based on SCr of 0.89 mg/dL). Recent Labs  Lab 05/21/21 2146 05/21/21 2344 05/22/21 0323 05/22/21 0342 05/23/21 0430 05/24/21 0358 05/24/21 0403 05/25/21 0517 05/26/21 0406 05/27/21 0541 05/28/21 0458  PROCALCITON  --   --  1.28  --  1.11 0.47  --   --   --   --   --   WBC  --   --   --  21.9* 19.0*  --    < > 17.2* 14.5* 12.9* 12.8*  LATICACIDVEN 2.4* 2.2*  --  1.5  --   --   --   --   --   --   --    < > = values in this interval not displayed.     Liver Function Tests: Recent Labs  Lab 05/21/21 1905 05/25/21 0517  AST 155* 47*  ALT 59* 41  ALKPHOS 76 58  BILITOT 1.1 1.0  PROT 7.6  6.2*  ALBUMIN 3.6 2.5*    No results for input(s): LIPASE, AMYLASE in the last 168 hours. Recent Labs  Lab 05/21/21 1905  AMMONIA 29     ABG    Component Value Date/Time   PHART 7.45 05/23/2021 0940   PCO2ART 32 05/23/2021 0940   PO2ART 80 (L) 05/23/2021 0940   HCO3 22.2 05/23/2021 0940   ACIDBASEDEF 1.0 05/23/2021 0940   O2SAT 96.3 05/23/2021 0940      Coagulation Profile: No results for input(s): INR, PROTIME in the last 168 hours.  Cardiac Enzymes: Recent Labs  Lab 05/22/21 0342 05/25/21 0517 05/26/21 0406 05/27/21 0541 05/28/21 0458  CKTOTAL 5,609* 430* 254* 157 136     HbA1C: Hgb A1c MFr Bld  Date/Time Value Ref Range Status  05/22/2021 05:20 AM 5.8 (H) 4.8 - 5.6 % Final    Comment:    (NOTE)         Prediabetes: 5.7 - 6.4         Diabetes: >6.4         Glycemic control for adults with diabetes: <7.0     CBG: Recent Labs  Lab 05/27/21 1120 05/27/21 1603 05/27/21 2029 05/27/21 2340 05/28/21 0346  GLUCAP 98 100* 124* 122* 129*     Allergies  No Known Allergies     DVT/GI PRX  assessed I Assessed the need for Labs I Assessed the need for Foley I Assessed the need for Central Venous Line Family Discussion when available I Assessed the need for Mobilization I made an Assessment of medications to be adjusted accordingly Safety Risk assessment completed  CASE DISCUSSED IN MULTIDISCIPLINARY ROUNDS WITH ICU TEAM     Critical Care Time devoted to patient care services described in this note is 45  minutes.  Critical care was necessary to treat /prevent imminent and life-threatening deterioration.    Corrin Parker, M.D.  [facility]  Medical Director [place] Director [facility] Cardio-Pulmonary Department

## 2021-05-28 NOTE — Progress Notes (Signed)
Patient remains confused except to self and sometimes place. Does not remember anything until after she was at hospital. Patient does admit she cannot care for self at home. She can move all extremities but weaker on the left side. At times does not appear to understand questions. Fair results from incentive spirometry. Whole body still covered  in bug bites  and scabs that appear to be healing slowly. Tolerating tube feedings. Still complains of hunger. Purplish areas on buttocks (outline of toilet seat?) skin is sloughing off but pink and healthy areas noted. Back of neck is healing well with bleeding noted instead clear gelatinous fluid. Right hand nails are caked with dirt but left hand nails are filed. Same noted with toes ie right foot neglected. No phone calls from either child today to inquire about their mom. 0900 Down to CT for repeat per orders. EVD clamped for movement and travel. EVD left clamped per order of PA.

## 2021-05-28 NOTE — Progress Notes (Signed)
Initial Nutrition Assessment  DOCUMENTATION CODES:   Obesity unspecified  INTERVENTION:   Osmolite 1.2 @ 41ml/hr- Initiate at 70ml/hr and increase by 57ml/hr q 8 hours until goal rate is reached.   Free water flushes 24ml q4 hours   Regimen provides 2016kcal/day, 93g/day protein and 1650ml/day of free water   Pt at refeed risk; recommend monitor potassium, magnesium and phosphorus labs daily until stable  NUTRITION DIAGNOSIS:   Inadequate oral intake related to acute illness as evidenced by NPO status.  GOAL:   Patient will meet greater than or equal to 90% of their needs -progressing with tube feeds   MONITOR:   Labs, Weight trends, TF tolerance, Skin, I & O's  ASSESSMENT:   65 y/o female with h/o HTN, asthma, breast cancer, CVA and subarachnoid hemorrhage s/p shunt placement for management of hydrocephalus > 10 years ago who is now admitted with shunt malfunction, sepsis and AMS now s/p left parietal approach ventriculostomy 10/11  Pt s/p NGT placement 10/15. Pt tolerating tube feeds at 55ml/hr; tube feeds being slowly advanced to goal rate. Refeed labs within normal limits. Per chart, pt down ~7lbs(4%) since admit. Plan is for SNF at discharge.   Medications reviewed and include: B12, lovenox, protonix, thiamine, ceftriaxone  Labs reviewed: Na 136 wnl, K 4.0 wnl, K 3.3 wnl, Mg 2.2 wnl Wbc- 12.8(H) Cbgs- 129, 120 x 24 hrs  Diet Order:   Diet Order             Diet NPO time specified  Diet effective now                  EDUCATION NEEDS:   No education needs have been identified at this time  Skin:  Skin Assessment: Reviewed RN Assessment (DTPI to the right ischial tuberosity 4 cm x 5 cm, incision head, DPI to back of neck)  Last BM:  10/17- type 6  Height:   Ht Readings from Last 1 Encounters:  05/21/21 5\' 2"  (1.575 m)    Weight:   Wt Readings from Last 1 Encounters:  05/28/21 85.5 kg    Ideal Body Weight:  50 kg  BMI:  Body mass index is  34.48 kg/m.  Estimated Nutritional Needs:   Kcal:  1800-2100kcal/day  Protein:  90-105g/day  Fluid:  1.5-1.8L/day  Koleen Distance MS, RD, LDN Please refer to [facility] for RD and/or RD on-call/weekend/after hours pager

## 2021-05-28 NOTE — Progress Notes (Addendum)
Neurology Progress Note  Patient ID: Desiree Mitchell is a 65 y.o. with PMHx of subarachnoid hemorrhage s/p drain placement (approximately 2007), breast cancer (port a cath in place), obesity (BMI 34.48) presented on 10/10 with altered mental status found by family slumped on the toilet.  Head CT was notable for increased ventricular size with concern for shunt malfunction in addition to sepsis (initially on cefepime, metronidazole, vancomycin, narrowed to cefepime 10/12-13, then ceftriaxone and linezolid 10/14-10/17, cultures with Proteus + staph in one culture and staph epidermidis in the other, source likely skin wounds). She underwent shunt revision on 05/22/2021, with possible concern for shunt infection, CSF pathology negative for infection (05/22/2021 CSF culture sample).  Course notable for bedbugs and roaches found on her present on arrival, posterior neck, buttocks and coccyx pressure injuries, AKI/transaminitis/rhabdomyolysis, respiratory failure requiring continued intubation postoperatively, and multifocal strokes on MRI brain embolic in appearance for which neurology was consulted   Major interval events/Subjective: Reports no acute complaints, nursing notes that the patient is frequently a bit unsettled when she has procedures or needs to be moved and just recently came back from CT scans at the time of my evaluation.  Exam: Vitals:   05/28/21 0700 05/28/21 0800  BP: (!) 146/93 (!) 159/95  Pulse: 79 84  Resp: 12 (!) 33  Temp: (!) 97.4 F (36.3 C)   SpO2: 99% 100%   Gen: In bed, comfortable  Head: Shaved, incision appears clean/dry/intact, Psychiatric: Very flat affect with notable psychomotor slowing though brightens slightly when speaking of her children and grandchildren Resp: non-labored breathing, no grossly audible wheezing Cardiac: Perfusing extremities well  Abd: soft, nt Skin: Lesions on her left neck and throughout her arms and legs in various stages of healing.  Some  edema of the left upper extremity which nursing reports has been stable  Neuro: MS: Awake, alert, left-sided neglect but can orient over to stimuli on the left with encouragement.  Somewhat limited evaluation due to psychomotor slowing with potential abulia overlying.  Oriented to self and [facility] but not to time or situation CN: Slight right gaze preference but can bury to the left.  Visual fields intact to blink to threat.  Pupils equal round reactive to light, face symmetric on casual observation but will not smile to command.  Slightly protrudes tongue midline. Motor: Moves bilateral arms and legs slightly to command, notably weaker on the left than the right.  Will not participate in formal confrontational strength testing but at least 3/5 in the right upper extremity and 1-2/5 throughout the other limbs Sensory: Equally reactive to touch in all 4 extremities DTR: 3+ bilateral brachioradialis, 1+ bilateral patellae  Pertinent Labs:   Basic Metabolic Panel: Recent Labs  Lab 05/24/21 0358 05/25/21 0517 05/25/21 2005 05/26/21 0406 05/26/21 1254 05/26/21 2016 05/27/21 0010 05/27/21 0541 05/27/21 2117 05/28/21 0458  NA 148* 151*   < > 146*   < > 142 139 138 135 136  K 4.4 4.2  --  3.9  --   --   --  4.4  --  4.0  CL 114* 118*  --  112*  --   --   --  104  --  100  CO2 26 25  --  27  --   --   --  27  --  28  GLUCOSE 117* 118*  --  142*  --   --   --  113*  --  148*  BUN 60* 46*  --  31*  --   --   --  23  --  21  CREATININE 1.38* 1.19*  --  1.05*  --   --   --  0.88  --  0.89  CALCIUM 8.1* 8.1*  --  7.4*  --   --   --  7.5*  --  7.7*  MG 3.1* 3.1*  --  3.0*  --   --   --  2.4  --  2.2  PHOS 2.3* 2.8  --  2.4*  --   --   --  3.3  --  3.3   < > = values in this interval not displayed.    CBC: Recent Labs  Lab 05/21/21 1905 05/22/21 0342 05/23/21 0430 05/24/21 0403 05/24/21 1446 05/25/21 0517 05/26/21 0406 05/27/21 0541 05/28/21 0458  WBC 30.3*   < >  19.0* 18.2*  --  17.2* 14.5* 12.9* 12.8*  NEUTROABS 27.1*  --  16.4*  --   --   --   --   --   --   HGB 15.9*   < > 13.4 13.3 13.2 13.6 11.8* 11.6* 11.6*  HCT 49.0*   < > 39.5 39.2 39.2 40.4 36.5 35.2* 36.5  MCV 96.3   < > 96.1 99.2  --  100.0 101.1* 99.2 98.1  PLT 491*   < > 401* 366  --  379 340 314 317   < > = values in this interval not displayed.   Folate low at 5.8 B12 low normal to 14  Lab Results  Component Value Date   HGBA1C 5.8 (H) 05/26/2021   Lab Results  Component Value Date   CHOL 128 05/26/2021   HDL 39 (L) 05/26/2021   LDLCALC 63 05/26/2021   TRIG 131 05/26/2021   CHOLHDL 3.3 05/26/2021   Lab Results  Component Value Date   TSH 2.160 05/26/2021   T4TOTAL 6.7 05/26/2021    TTE 10/15:   1. Left ventricular ejection fraction, by estimation, is 50 to 55%. The left ventricle has low normal function. The left ventricle has no regional  wall motion abnormalities. Left ventricular diastolic parameters are consistent with Grade I diastolic  dysfunction (impaired relaxation).   2. Right ventricular systolic function is normal. The right ventricular size is normal.   3. The mitral valve is normal in structure. Trivial mitral valve regurgitation. No evidence of mitral stenosis.   4. The aortic valve is normal in structure. Aortic valve regurgitation is not visualized. No aortic stenosis is present.   5. The inferior vena cava is normal in size with greater than 50% respiratory variability, suggesting right atrial pressure of 3 mmHg.   Head CT 05/28/2021 personally reviewed, agree with radiology: Stable CT study. VP shunt in place with stable ventricular size. Extensive chronic ischemic changes as described above. Subacute infarction in the right basal ganglia/radiating white matter tracts as seen previously.  Impression: Embolic strokes in the setting of sepsis, concern for hypercoagulable state versus potential cardioembolic etiology.  Delirium in the setting of other  medical conditions.   Recommendations:  #Multifocal strokes  -Aspirin 81 mg daily when safe from a neurosurgical perspective -Meeting LDL goal less than 70 and A1c goal less than 7% as detailed above, no indication for additional medications at this time -Continue telemetry -TEE to rule out vegetation given negative TTE  #Shunt dysfunction -Appreciate neurosurgical management  #Low normal B12 -Homocystine pending, continue B12 empiric supplementation for goal greater than 500 (level 214) -Repeat levels  to be followed by primary care physician  #Delirium  - try to minimize deliriogenic medications as much as possible (J Am Geriatr Soc. 2012 Apr;60(4):616-31): benzodiazepines, anticholinergics, diphenhydramine, antihistamines, narcotics, Ambien/Lunesta/Sonata etc. - environmental support for delirium: Lights on during the day, patient up and out of bed as much as is feasible, OT/PT, quiet dimly lit room at night, reorient patient often, provide hearing aides and glasses if patient uses them routinely, minimize sleep disruptions as much as possible overnight.  As much as possible, reorient patient, and have them engage patient in activities, e.g. playing cards. TV should be off or on neutral background music unless patient engaged and watching. Try to keep interactions with the patient calm and quiet.     Appreciate management of other comorbidities per primary team, infectious disease.   Lesleigh Noe MD-PhD [facility] [phone]  [facility] coverage for [facility] is from 8 AM to 4 AM in-house and 4 PM to 8 PM by telephone/video. 8 PM to 8 AM emergent questions or overnight urgent questions should be addressed to Teleneurology On-call or [facility] neurohospitalist; contact information can be found on AMION  CRITICAL CARE Performed by: Lorenza Chick   Total critical care time: 40 minutes  Critical care time was exclusive of separately billable procedures and  treating other patients.  Critical care was necessary to treat or prevent imminent or life-threatening deterioration.  Critical care was time spent personally by me on the following activities: development of treatment plan with patient and/or surrogate as well as nursing, discussions with consultants, evaluation of patient's response to treatment, examination of patient, obtaining history from patient or surrogate, ordering and performing treatments and interventions, ordering and review of laboratory studies, ordering and review of radiographic studies, pulse oximetry and re-evaluation of patient's condition.

## 2021-05-28 NOTE — Consult Note (Signed)
[facility] Consultation Note  Patient ID: Desiree Mitchell, MRN: [id number], DOB/AGE: Mar 25, 1956 65 y.o. Admit date: 05/21/2021   Date of Consult: 05/28/2021 Primary Physician: Pcp, No Primary Cardiologist: None  Chief Complaint:  Chief Complaint  Patient presents with  . Altered Mental Status   Reason for Consult:  Sepsis with possible endocarditis  HPI: 65 y.o. female with known apparent previous subarachnoid hemorrhage in the past for which she had a shunt placed and also has had breast cancer and other issues.  Under recent hospital admission the patient had significant altered mental status for which she was found to have apparent sepsis.  This has many potential sources although has had some improvements of her symptoms.  She has slowly improved with her mental status but is somewhat somnolent today.  She is hemodynamically stable but there is concerns that the patient may have endocarditis versus other scan and/or shunt involved infection.  Slowly she has improved but will need to rule out the possibility of endocarditis as another source.  History reviewed. No pertinent past medical history.    Surgical History:  Past Surgical History:  Procedure Laterality Date  . SHUNT REMOVAL Left 05/21/2021   Procedure: SHUNT REMOVAL;  Surgeon: Meade Maw, MD;  Location: [facility] ORS;  Service: Neurosurgery;  Laterality: Left;  Marland Kitchen VENTRICULOSTOMY Left 05/21/2021   Procedure: VENTRICULOSTOMY;  Surgeon: Meade Maw, MD;  Location: [facility] ORS;  Service: Neurosurgery;  Laterality: Left;     Home Meds: Prior to Admission medications   Not on File    Inpatient Medications:  . budesonide (PULMICORT) nebulizer solution  0.5 mg Nebulization BID  . chlorhexidine gluconate (MEDLINE KIT)  15 mL Mouth Rinse BID  . Chlorhexidine Gluconate Cloth  6 each Topical Daily  . collagenase   Topical Daily  . cyanocobalamin  1,000 mcg Intramuscular Q0600  . enoxaparin (LOVENOX)  injection  40 mg Subcutaneous Q24H  . free water  50 mL Per Tube Q4H  . Gerhardt's butt cream   Topical QID  . ipratropium-albuterol  3 mL Nebulization Q6H  . pantoprazole (PROTONIX) IV  40 mg Intravenous QHS  . sodium chloride flush  10-40 mL Intracatheter Q12H  . thiamine injection  100 mg Intravenous Daily   . cefTRIAXone (ROCEPHIN)  IV 2 g (05/28/21 0952)  . feeding supplement (OSMOLITE 1.2 CAL) 1,000 mL (05/28/21 1635)  . linezolid (ZYVOX) IV 600 mg (05/28/21 1039)    Allergies: No Known Allergies  Social History   Socioeconomic History  . Marital status: Widowed    Spouse name: Not on file  . Number of children: Not on file  . Years of education: Not on file  . Highest education level: Not on file  Occupational History  . Not on file  Tobacco Use  . Smoking status: Not on file  . Smokeless tobacco: Not on file  Substance and Sexual Activity  . Alcohol use: Not on file  . Drug use: Not on file  . Sexual activity: Not on file  Other Topics Concern  . Not on file  Social History Narrative  . Not on file   Social Determinants of Health   Financial Resource Strain: Not on file  Food Insecurity: Not on file  Transportation Needs: Not on file  Physical Activity: Not on file  Stress: Not on file  Social Connections: Not on file  Intimate Partner Violence: Not on file     History reviewed. No pertinent family history.   Review of Systems Somewhat  somnolent Labs: Recent Labs    05/26/21 0406 05/27/21 0541 05/28/21 0458  CKTOTAL 254* 157 136   Lab Results  Component Value Date   WBC 12.8 (H) 05/28/2021   HGB 11.6 (L) 05/28/2021   HCT 36.5 05/28/2021   MCV 98.1 05/28/2021   PLT 317 05/28/2021    Recent Labs  Lab 05/25/21 0517 05/25/21 2005 05/28/21 0458  NA 151*   < > 136  K 4.2   < > 4.0  CL 118*   < > 100  CO2 25   < > 28  BUN 46*   < > 21  CREATININE 1.19*   < > 0.89  CALCIUM 8.1*   < > 7.7*  PROT 6.2*  --   --   BILITOT 1.0  --   --    ALKPHOS 58  --   --   ALT 41  --   --   AST 47*  --   --   GLUCOSE 118*   < > 148*   < > = values in this interval not displayed.   Lab Results  Component Value Date   CHOL 128 05/26/2021   HDL 39 (L) 05/26/2021   LDLCALC 63 05/26/2021   TRIG 131 05/26/2021   No results found for: DDIMER  Radiology/Studies:  DG Abd 1 View  Result Date: 05/22/2021 CLINICAL DATA:  Intubation and OG tube placement EXAM: PORTABLE CHEST 1 VIEW COMPARISON:  Chest 05/21/2021 FINDINGS: An endotracheal tube has been placed with its tip measuring 4.6 cm above the carina. An enteric tube has been placed with its tip in the right upper quadrant consistent with location in the distal stomach. Pre-existing right central venous catheter. Shallow inspiration. Heart size and pulmonary vascularity are normal. Linear atelectasis in the lung bases, progressing since prior study. No pleural effusions. No pneumothorax. Mediastinal contours appear intact. Visualized bowel gas pattern is normal. IMPRESSION: Appliances appear in satisfactory location. Electronically Signed   By: Lucienne Capers M.D.   On: 05/22/2021 03:55   CT HEAD WO CONTRAST (5MM)  Result Date: 05/28/2021 CLINICAL DATA:  Hydrocephalus.  Found unresponsive. EXAM: CT HEAD WITHOUT CONTRAST TECHNIQUE: Contiguous axial images were obtained from the base of the skull through the vertex without intravenous contrast. COMPARISON:  Head CT 05/24/2021.  MRI 05/25/2021. FINDINGS: Brain: No appreciable change since the CT of 4 days ago. VP shunt enters from a left parietal approach position of the catheter is unchanged and ventricular size is unchanged. Catheter appears to communicate with both the occipital horn of the left lateral ventricle and the third ventricle. Extensive chronic low-density seen throughout the cerebral hemispheric white matter. 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 Subacute infarction in the  right basal ganglia appears similar. No sign of new infarction, mass lesion, hemorrhage or extra-axial collection. Vascular: There is atherosclerotic calcification of the major vessels at the base of the brain. Skull: Negative except for previous burr holes. Sinuses/Orbits: Clear/normal Other: None IMPRESSION: Stable CT study. VP shunt in place with stable ventricular size. Extensive chronic ischemic changes as described above. Subacute infarction in the right basal ganglia/radiating white matter tracts as seen previously. Electronically Signed   By: Nelson Chimes M.D.   On: 05/28/2021 12:48   CT HEAD WO CONTRAST (5MM)  Result Date: 05/24/2021 CLINICAL DATA:  Hydrocephalus EXAM: CT HEAD WITHOUT CONTRAST TECHNIQUE: Contiguous axial images were obtained from the base of the skull through the vertex without intravenous contrast.  COMPARISON:  05/23/2021 FINDINGS: Brain: Left parietal approach ventriculostomy catheter terminates in the inferior right frontal lobe, unchanged. Overall unchanged size and configuration of ventricular system compared to 05/23/2021 and 05/21/2021. No acute infarction, hemorrhage, extra-axial collection, mass, mass effect, or midline shift. Periventricular white matter hypodensity, unchanged, which may represent transependymal flow of CSF and/or sequela of chronic small vessel ischemic disease. Vascular: No hyperdense vessel. Skull: Right frontal and left parietal burr holes. No acute fracture. Sinuses/Orbits: Negative. Other: The mastoids are well aerated. IMPRESSION: Unchanged size and configuration of the ventricular system, with unchanged position of the left parietal approach ventriculostomy catheter. Unchanged periventricular hypodensity, which may represent transependymal flow CSF and/or chronic small vessel ischemic disease. Electronically Signed   By: Merilyn Baba M.D.   On: 05/24/2021 11:52   CT HEAD WO CONTRAST (5MM)  Result Date: 05/23/2021 CLINICAL DATA:  Hydrocephalus  EXAM: CT HEAD WITHOUT CONTRAST TECHNIQUE: Contiguous axial images were obtained from the base of the skull through the vertex without intravenous contrast. COMPARISON:  05/21/2021. FINDINGS: Brain: Status post interval shunt revision, with left parietal approach ventriculostomy catheter traversing the left lateral ventricle and third ventricle and terminating in inferior right frontal lobe. Overall unchanged size and configuration of the ventricular system compared to 05/21/2021. No acute infarction, hemorrhage, extra-axial collection, mass, mass effect, or midline shift. Periventricular white matter hypodensity, likely transependymal flow of CSF in addition to the sequela of chronic small vessel ischemic disease. Vascular: No hyperdense vessel. Skull: Right frontal and left parietal burr holes. Negative for acute fracture. Sinuses/Orbits: Minimal mucosal thickening in the left maxillary sinus. The orbits are unremarkable. Other: The mastoids are well aerated. Superficial skin staples overlying the left parietal scalp, with externalization of the extracranial ventriculostomy catheter. IMPRESSION: Status post interval shunt revision, with left parietal approach ventriculostomy catheter terminating in the inferior right frontal lobe. Overall unchanged size and configuration of the ventricular system, with redemonstrated periventricular hypodensity, likely transependymal flow of CSF in addition to chronic small vessel ischemic disease. Electronically Signed   By: Merilyn Baba M.D.   On: 05/23/2021 11:09   CT HEAD WO CONTRAST (5MM)  Result Date: 05/21/2021 CLINICAL DATA:  Found unresponsive.  VP shunt in place. EXAM: CT HEAD WITHOUT CONTRAST CT CERVICAL SPINE WITHOUT CONTRAST CT CHEST, ABDOMEN AND PELVIS WITHOUT CONTRAST TECHNIQUE: Contiguous axial images were obtained from the base of the skull through the vertex without intravenous contrast. Multidetector CT imaging of the cervical spine was performed without  intravenous contrast. Multiplanar CT image reconstructions were also generated. Multidetector CT imaging of the chest, abdomen and pelvis was performed following the standard protocol without IV contrast. COMPARISON:  Pet CT 01/16/2011, chest x-ray 11/05/2012, CT miscellaneous 10/03/2011 FINDINGS: CT HEAD FINDINGS BRAIN: BRAIN Left posterior approach ventriculoperitoneal shunt with tip terminating midline along the anterior left lateral ventricle. Cerebral volume loss as well as interval development of mild hydrocephalus with surrounding periventricular edema. Patchy and confluent areas of decreased attenuation are noted throughout the deep and periventricular white matter of the cerebral hemispheres bilaterally, compatible with chronic microvascular ischemic disease. No evidence of large-territorial acute infarction. No parenchymal hemorrhage. No mass lesion. No extra-axial collection. No mass effect or midline shift. No hydrocephalus. Basilar cisterns are patent. Vascular: No hyperdense vessel. Atherosclerotic calcifications are present within the cavernous internal carotid arteries. Skull: No acute fracture or focal lesion. Sinuses/Orbits: Paranasal sinuses and mastoid air cells are clear. The orbits are unremarkable. Other: None. CT CERVICAL FINDINGS Alignment: Normal. Skull base and vertebrae: No acute fracture.  No aggressive appearing focal osseous lesion or focal pathologic process. Soft tissues and spinal canal: No prevertebral fluid or swelling. No visible canal hematoma. Upper chest: Unremarkable. Other: Left ventricular peritoneal shunt is noted to coil within the soft tissues of the neck. CT CHEST: Ports and Devices: Right chest wall Port-A-Cath with tip at the level of the superior cavoatrial junction. Left VA shunt terminates in the anterior mediastinum and no longer within the left brachiocephalic vein as noted on CT miscellaneous 10/03/2011. Lungs/airways: Ground-glass airspace opacity within the  right lower lobe and patchy airspace opacity within left lower lobe. No focal consolidation. No pulmonary nodule. No pulmonary mass. No pulmonary contusion or laceration. No pneumatocele formation. The central airways are patent. Pleura: No pleural effusion. No pneumothorax. No hemothorax. Lymph Nodes: Limited evaluation for hilar lymphadenopathy on this noncontrast study. No mediastinal or axillary lymphadenopathy. Right axillary surgical clips. Mediastinum: No pneumomediastinum. No fat stranding within the mediastinum. No hematoma formation. The thoracic aorta is normal in caliber. The heart is normal in size. No significant pericardial effusion. The esophagus is unremarkable. Interval development of an enlarged right thyroid gland with limited evaluation due to streak artifact. Chest Wall / Breasts: No chest wall mass. Musculoskeletal: No acute rib or sternal fracture. No spinal fracture. CT ABDOMEN / PELVIS: Liver: Not enlarged. No focal lesion. Biliary System: Limited evaluation due to motion artifact. The gallbladder is otherwise unremarkable with no radio-opaque gallstones. No biliary ductal dilatation. Pancreas: Normal pancreatic contour. No main pancreatic duct dilatation. Spleen: Not enlarged. No focal lesion. Adrenal Glands: No nodularity bilaterally. Kidneys: No hydroureteronephrosis. No nephroureterolithiasis. No contour deforming renal mass. Foley catheter terminates within the urinary bladder lumen. The urinary bladder is decompressed. Bowel: No small or large bowel wall thickening or dilatation. The appendix is unremarkable. Mesentery, Omentum, and Peritoneum: No simple free fluid ascites. No pneumoperitoneum. No mesenteric hematoma identified. No organized fluid collection. Pelvic Organs: The uterus and bilateral adnexal regions are unremarkable. Lymph Nodes: No abdominal, pelvic, inguinal lymphadenopathy. Vasculature: No abdominal aorta or iliac aneurysm. Musculoskeletal: No significant soft tissue  hematoma. No acute pelvic fracture. No spinal fracture. IMPRESSION: 1. Interval development of mild hydrocephalus and periventricular edema. 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 The shunt is noted to be coiled within the neck. Recommend neurosurgery consultation. 2. No acute displaced fracture or traumatic listhesis of the cervical spine. 3. Ground-glass airspace opacity within the right lower lobe and patchy airspace opacity within left lower lobe. Findings may represent a combination of infection/inflammation as well as atelectasis. 4. No acute traumatic injury to the chest, abdomen, or pelvis with limited evaluation on this noncontrast study. 5. No acute fracture or traumatic malalignment of the thoracic or lumbar spine. 6.  Aortic Atherosclerosis (ICD10-I70.0). 7. Interval development of an enlarged right thyroid gland with limited evaluation due to streak artifact. Recommend thyroid ultrasound (ref: J Am Coll Radiol. 2015 Feb;12(2): 143-50). These results were called by telephone at the time of interpretation on 05/21/2021 at 9:48 pm to provider [facility] , who verbally acknowledged these results. Electronically Signed   By: Iven Finn M.D.   On: 05/21/2021 21:54   CT Cervical Spine Wo Contrast  Result Date: 05/21/2021 CLINICAL DATA:  Found unresponsive.  VP shunt in place. EXAM: CT HEAD WITHOUT CONTRAST CT CERVICAL SPINE WITHOUT CONTRAST CT CHEST, ABDOMEN AND PELVIS WITHOUT CONTRAST TECHNIQUE: Contiguous axial images were obtained from the  base of the skull through the vertex without intravenous contrast. Multidetector CT imaging of the cervical spine was performed without intravenous contrast. Multiplanar CT image reconstructions were also generated. Multidetector CT imaging of the chest, abdomen and pelvis was performed following the standard protocol without IV  contrast. COMPARISON:  Pet CT 01/16/2011, chest x-ray 11/05/2012, CT miscellaneous 10/03/2011 FINDINGS: CT HEAD FINDINGS BRAIN: BRAIN Left posterior approach ventriculoperitoneal shunt with tip terminating midline along the anterior left lateral ventricle. Cerebral volume loss as well as interval development of mild hydrocephalus with surrounding periventricular edema. Patchy and confluent areas of decreased attenuation are noted throughout the deep and periventricular white matter of the cerebral hemispheres bilaterally, compatible with chronic microvascular ischemic disease. No evidence of large-territorial acute infarction. No parenchymal hemorrhage. No mass lesion. No extra-axial collection. No mass effect or midline shift. No hydrocephalus. Basilar cisterns are patent. Vascular: No hyperdense vessel. Atherosclerotic calcifications are present within the cavernous internal carotid arteries. Skull: No acute fracture or focal lesion. Sinuses/Orbits: Paranasal sinuses and mastoid air cells are clear. The orbits are unremarkable. Other: None. CT CERVICAL FINDINGS Alignment: Normal. Skull base and vertebrae: No acute fracture. No aggressive appearing focal osseous lesion or focal pathologic process. Soft tissues and spinal canal: No prevertebral fluid or swelling. No visible canal hematoma. Upper chest: Unremarkable. Other: Left ventricular peritoneal shunt is noted to coil within the soft tissues of the neck. CT CHEST: Ports and Devices: Right chest wall Port-A-Cath with tip at the level of the superior cavoatrial junction. Left VA shunt terminates in the anterior mediastinum and no longer within the left brachiocephalic vein as noted on CT miscellaneous 10/03/2011. Lungs/airways: Ground-glass airspace opacity within the right lower lobe and patchy airspace opacity within left lower lobe. No focal consolidation. No pulmonary nodule. No pulmonary mass. No pulmonary contusion or laceration. No pneumatocele formation.  The central airways are patent. Pleura: No pleural effusion. No pneumothorax. No hemothorax. Lymph Nodes: Limited evaluation for hilar lymphadenopathy on this noncontrast study. No mediastinal or axillary lymphadenopathy. Right axillary surgical clips. Mediastinum: No pneumomediastinum. No fat stranding within the mediastinum. No hematoma formation. The thoracic aorta is normal in caliber. The heart is normal in size. No significant pericardial effusion. The esophagus is unremarkable. Interval development of an enlarged right thyroid gland with limited evaluation due to streak artifact. Chest Wall / Breasts: No chest wall mass. Musculoskeletal: No acute rib or sternal fracture. No spinal fracture. CT ABDOMEN / PELVIS: Liver: Not enlarged. No focal lesion. Biliary System: Limited evaluation due to motion artifact. The gallbladder is otherwise unremarkable with no radio-opaque gallstones. No biliary ductal dilatation. Pancreas: Normal pancreatic contour. No main pancreatic duct dilatation. Spleen: Not enlarged. No focal lesion. Adrenal Glands: No nodularity bilaterally. Kidneys: No hydroureteronephrosis. No nephroureterolithiasis. No contour deforming renal mass. Foley catheter terminates within the urinary bladder lumen. The urinary bladder is decompressed. Bowel: No small or large bowel wall thickening or dilatation. The appendix is unremarkable. Mesentery, Omentum, and Peritoneum: No simple free fluid ascites. No pneumoperitoneum. No mesenteric hematoma identified. No organized fluid collection. Pelvic Organs: The uterus and bilateral adnexal regions are unremarkable. Lymph Nodes: No abdominal, pelvic, inguinal lymphadenopathy. Vasculature: No abdominal aorta or iliac aneurysm. Musculoskeletal: No significant soft tissue hematoma. No acute pelvic fracture. No spinal fracture. IMPRESSION: 1. Interval development of mild hydrocephalus and periventricular edema. Associated malpositioned left VA shunt that terminates  in the anterior mediastinum and no longer within the left brachiocephalic vein as noted on CT miscellaneous 10/03/2011. The shunt is noted to  be coiled within the neck. Recommend neurosurgery consultation. 2. No acute displaced fracture or traumatic listhesis of the cervical spine. 3. Ground-glass airspace opacity within the right lower lobe and patchy airspace opacity within left lower lobe. Findings may represent a combination of infection/inflammation as well as atelectasis. 4. No acute traumatic injury to the chest, abdomen, or pelvis with limited evaluation on this noncontrast study. 5. No acute fracture or traumatic malalignment of the thoracic or lumbar spine. 6.  Aortic Atherosclerosis (ICD10-I70.0). 7. Interval development of an enlarged right thyroid gland with limited evaluation due to streak artifact. Recommend thyroid ultrasound (ref: J Am Coll Radiol. 2015 Feb;12(2): 143-50). These results were called by telephone at the time of interpretation on 05/21/2021 at 9:48 pm to provider [facility] , who verbally acknowledged these results. Electronically Signed   By: Iven Finn M.D.   On: 05/21/2021 21:54   MR BRAIN WO CONTRAST  Result Date: 05/25/2021 CLINICAL DATA:  Neuro deficit, acute, stroke suspected EXAM: MRI HEAD WITHOUT CONTRAST TECHNIQUE: Multiplanar, multiecho pulse sequences of the brain and surrounding structures were obtained without intravenous contrast. COMPARISON:  Correlation made with prior CTs FINDINGS: Brain: There are a few scattered small foci of reduced diffusion including involvement of the right basal ganglia and corona radiata, left basal ganglia, right inferior parietal cortex, parasagittal left frontal cortex, and anterior right cerebellum. Left posterior approach ventricular catheter extends to the right of midline traversing the third ventricle with tip extending laterally between frontal horn and third ventricle. Prior right frontal catheter tract. Ventricles are  similar in size to recent CT imaging. Right anterior temporal encephalomalacia. 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 Chronic infarcts and/or perivascular spaces along the central cerebral white matter and deep gray nuclei. Patchy and confluent areas of T2 hyperintensity in the supratentorial white matter may reflect chronic microvascular ischemic changes. Scattered foci of susceptibility likely reflecting chronic microhemorrhages due to hypertension. No intracranial mass or mass effect. Vascular: Major vessel flow voids at the sk few scattered small acute ull base are preserved. Skull and upper cervical spine: Normal marrow signal is preserved. Sinuses/Orbits: Paranasal sinuses are aerated. Orbits are unremarkable. Other: Sella is unremarkable.  Mastoid air cells are clear. IMPRESSION: Few scattered small acute infarcts involving different vascular territories. Ventricular catheter present with similar caliber of ventricles as compared to recent CT. Chronic microvascular ischemic changes, chronic infarcts, and chronic microhemorrhages. Electronically Signed   By: Macy Mis M.D.   On: 05/25/2021 15:31   DG Chest Port 1 View  Result Date: 05/24/2021 CLINICAL DATA:  Hypoxia EXAM: PORTABLE CHEST 1 VIEW COMPARISON:  05/22/2021 FINDINGS: Endotracheal tube and NG tube removed. Port-A-Cath tip in the SVC unchanged. Cardiac enlargement.  Negative for heart failure or edema Progression of left lower lobe consolidation. Mild improvement in right lower lobe atelectasis. No significant effusion. IMPRESSION: Endotracheal tube removed. Progression of left lower lobe atelectasis with improvement in right lower lobe atelectasis. Electronically Signed   By: Franchot Gallo M.D.   On: 05/24/2021 10:50   DG Chest Port 1 View  Result Date: 05/22/2021 CLINICAL DATA:  Intubation and OG tube placement EXAM: PORTABLE CHEST 1 VIEW COMPARISON:  Chest 05/21/2021 FINDINGS: An endotracheal tube has been  placed with its tip measuring 4.6 cm above the carina. An enteric tube has been placed with its tip in the right upper quadrant consistent with location in the distal stomach. Pre-existing right central venous catheter. Shallow inspiration. Heart size and pulmonary vascularity are normal. Linear atelectasis  in the lung bases, progressing since prior study. No pleural effusions. No pneumothorax. Mediastinal contours appear intact. Visualized bowel gas pattern is normal. IMPRESSION: Appliances appear in satisfactory location. Electronically Signed   By: Lucienne Capers M.D.   On: 05/22/2021 03:55   DG Chest Portable 1 View  Result Date: 05/21/2021 CLINICAL DATA:  Found unresponsive. EXAM: PORTABLE CHEST 1 VIEW COMPARISON:  None. FINDINGS: A right-sided venous Port-A-Cath is seen with its distal tip noted at the junction of the superior vena cava and right atrium. Intact shunt tubing is seen overlying the left neck soft tissues and left upper lung. Very mild atelectasis is seen within the left lung base. There is no evidence of acute infiltrate, pleural effusion or pneumothorax. The heart size and mediastinal contours are within normal limits. The visualized skeletal structures are unremarkable. IMPRESSION: 1. Very mild left basilar atelectasis without evidence of an acute infiltrate. Electronically Signed   By: Virgina Norfolk M.D.   On: 05/21/2021 19:32   DG Abd Portable 1V  Result Date: 05/28/2021 CLINICAL DATA:  NG tube placement EXAM: PORTABLE ABDOMEN - 1 VIEW COMPARISON:  05/26/2021 FINDINGS: Limited radiograph of the lower chest and upper abdomen was obtained for the purposes of enteric tube localization. Enteric tube is seen coursing below the diaphragm with distal tip and side port terminating within the expected location of the gastric body. IMPRESSION: Enteric tube tip and side port are seen within the gastric body. Electronically Signed   By: Davina Poke D.O.   On: 05/28/2021 10:53   DG  Abd Portable 1V  Result Date: 05/26/2021 CLINICAL DATA:  Check gastric catheter placement EXAM: PORTABLE ABDOMEN - 1 VIEW COMPARISON:  05/22/2021 FINDINGS: Gastric catheter is noted within the stomach. It is less deep than that seen on the prior exam. No obstructive changes are seen. IMPRESSION: Gastric catheter within the stomach. Electronically Signed   By: Inez Catalina M.D.   On: 05/26/2021 19:00   ECHOCARDIOGRAM COMPLETE  Result Date: 05/27/2021    ECHOCARDIOGRAM REPORT   Patient Name:   Desiree Mitchell Date of Exam: 05/26/2021 Medical Rec #:  [id number]     Height:       62.0 in Accession #:    [id number]    Weight:       192.0 lb Date of Birth:  [birth year]     BSA:          1.879 m Patient Age:    13 years      BP:           155/75 mmHg Patient Gender: F             HR:           92 bpm. Exam Location:  [facility] Procedure: 2D Echo, Cardiac Doppler and Color Doppler Indications:     Stroke I63.9  History:         Patient has no prior history of Echocardiogram examinations.  Sonographer:     Alyse Low Roar Referring Phys:  [id number] Bradly Bienenstock Diagnosing Phys: Isaias Cowman MD IMPRESSIONS  1. Left ventricular ejection fraction, by estimation, is 50 to 55%. The left ventricle has low normal function. The left ventricle has no regional wall motion abnormalities. Left ventricular diastolic parameters are consistent with Grade I diastolic dysfunction (impaired relaxation).  2. Right ventricular systolic function is normal. The right ventricular size is normal.  3. The mitral valve is normal in structure. Trivial mitral valve regurgitation. No evidence of mitral stenosis.  4. The aortic valve is normal in structure. Aortic valve regurgitation is not visualized. No aortic stenosis is present.  5. The inferior vena cava is normal in size with greater than 50% respiratory variability, suggesting right atrial pressure of 3 mmHg. FINDINGS  Left Ventricle: Left ventricular ejection fraction, by estimation, is 50  to 55%. The left ventricle has low normal function. The left ventricle has no regional wall motion abnormalities. The left ventricular internal cavity size was normal in size. There is no left ventricular hypertrophy. Left ventricular diastolic parameters are consistent with Grade I diastolic dysfunction (impaired relaxation). Right Ventricle: The right ventricular size is normal. No increase in right ventricular wall thickness. Right ventricular systolic function is normal. Left Atrium: Left atrial size was normal in size. Right Atrium: Right atrial size was normal in size. Pericardium: There is no evidence of pericardial effusion. Mitral Valve: The mitral valve is normal in structure. Trivial mitral valve regurgitation. No evidence of mitral valve stenosis. Tricuspid Valve: The tricuspid valve is normal in structure. Tricuspid valve regurgitation is trivial. No evidence of tricuspid stenosis. Aortic Valve: The aortic valve is normal in structure. Aortic valve regurgitation is not visualized. No aortic stenosis is present. Aortic valve peak gradient measures 5.5 mmHg. Pulmonic Valve: The pulmonic valve was normal in structure. Pulmonic valve regurgitation is not visualized. No evidence of pulmonic stenosis. Aorta: The aortic root is normal in size and structure. Venous: The inferior vena cava is normal in size with greater than 50% respiratory variability, suggesting right atrial pressure of 3 mmHg. IAS/Shunts: No atrial level shunt detected by color flow Doppler.  LEFT VENTRICLE PLAX 2D LVIDd:         4.73 cm   Diastology LVIDs:         3.55 cm   LV e' medial:    4.03 cm/s LV PW:         0.94 cm   LV E/e' medial:  14.7 LV IVS:        1.26 cm   LV e' lateral:   5.66 cm/s LVOT diam:     1.80 cm   LV E/e' lateral: 10.4 LVOT Area:     2.54 cm  RIGHT VENTRICLE RV Mid diam:    2.42 cm RV S prime:     16.00 cm/s TAPSE (M-mode): 2.4 cm LEFT ATRIUM             Index        RIGHT ATRIUM           Index LA diam:        3.60  cm 1.92 cm/m   RA Area:     13.20 cm LA Vol (A2C):   44.7 ml 23.79 ml/m  RA Volume:   27.20 ml  14.48 ml/m LA Vol (A4C):   35.5 ml 18.89 ml/m LA Biplane Vol: 40.3 ml 21.45 ml/m  AORTIC VALVE                 PULMONIC VALVE AV Area (Vmax): 2.04 cm     PV Vmax:        0.77 m/s AV Vmax:        117.00 cm/s  PV Peak grad:   2.4 mmHg AV Peak Grad:   5.5 mmHg     RVOT Peak grad: 2 mmHg LVOT Vmax:      93.80 cm/s  AORTA Ao Root diam: 2.70 cm MITRAL VALVE MV Area (PHT): 3.34 cm     SHUNTS MV  Decel Time: 227 msec     Systemic Diam: 1.80 cm MV E velocity: 59.10 cm/s MV A velocity: 114.00 cm/s MV E/A ratio:  0.52 MV A Prime:    10.4 cm/s Isaias Cowman MD Electronically signed by Isaias Cowman MD Signature Date/Time: 05/27/2021/10:12:01 AM    Final    CT CHEST ABDOMEN PELVIS WO CONTRAST  Result Date: 05/21/2021 CLINICAL DATA:  Found unresponsive.  VP shunt in place. EXAM: CT HEAD WITHOUT CONTRAST CT CERVICAL SPINE WITHOUT CONTRAST CT CHEST, ABDOMEN AND PELVIS WITHOUT CONTRAST TECHNIQUE: Contiguous axial images were obtained from the base of the skull through the vertex without intravenous contrast. Multidetector CT imaging of the cervical spine was performed without intravenous contrast. Multiplanar CT image reconstructions were also generated. Multidetector CT imaging of the chest, abdomen and pelvis was performed following the standard protocol without IV contrast. COMPARISON:  Pet CT 01/16/2011, chest x-ray 11/05/2012, CT miscellaneous 10/03/2011 FINDINGS: CT HEAD FINDINGS BRAIN: BRAIN Left posterior approach ventriculoperitoneal shunt with tip terminating midline along the anterior left lateral ventricle. Cerebral volume loss as well as interval development of mild hydrocephalus with surrounding periventricular edema. Patchy and confluent areas of decreased attenuation are noted throughout the deep and periventricular white matter of the cerebral hemispheres bilaterally, compatible with chronic  microvascular ischemic disease. No evidence of large-territorial acute infarction. No parenchymal hemorrhage. No mass lesion. No extra-axial collection. No mass effect or midline shift. No hydrocephalus. Basilar cisterns are patent. Vascular: No hyperdense vessel. Atherosclerotic calcifications are present within the cavernous internal carotid arteries. Skull: No acute fracture or focal lesion. Sinuses/Orbits: Paranasal sinuses and mastoid air cells are clear. The orbits are unremarkable. Other: None. CT CERVICAL FINDINGS Alignment: Normal. Skull base and vertebrae: No acute fracture. No aggressive appearing focal osseous lesion or focal pathologic process. Soft tissues and spinal canal: No prevertebral fluid or swelling. No visible canal hematoma. Upper chest: Unremarkable. Other: Left ventricular peritoneal shunt is noted to coil within the soft tissues of the neck. CT CHEST: Ports and Devices: Right chest wall Port-A-Cath with tip at the level of the superior cavoatrial junction. Left VA shunt terminates in the anterior mediastinum and no longer within the left brachiocephalic vein as noted on CT miscellaneous 10/03/2011. Lungs/airways: Ground-glass airspace opacity within the right lower lobe and patchy airspace opacity within left lower lobe. No focal consolidation. No pulmonary nodule. No pulmonary mass. No pulmonary contusion or laceration. No pneumatocele formation. The central airways are patent. Pleura: No pleural effusion. No pneumothorax. No hemothorax. Lymph Nodes: Limited evaluation for hilar lymphadenopathy on this noncontrast study. No mediastinal or axillary lymphadenopathy. Right axillary surgical clips. Mediastinum: No pneumomediastinum. No fat stranding within the mediastinum. No hematoma formation. The thoracic aorta is normal in caliber. The heart is normal in size. No significant pericardial effusion. The esophagus is unremarkable. Interval development of an enlarged right thyroid gland with  limited evaluation due to streak artifact. Chest Wall / Breasts: No chest wall mass. Musculoskeletal: No acute rib or sternal fracture. No spinal fracture. CT ABDOMEN / PELVIS: Liver: Not enlarged. No focal lesion. Biliary System: Limited evaluation due to motion artifact. The gallbladder is otherwise unremarkable with no radio-opaque gallstones. No biliary ductal dilatation. Pancreas: Normal pancreatic contour. No main pancreatic duct dilatation. Spleen: Not enlarged. No focal lesion. Adrenal Glands: No nodularity bilaterally. Kidneys: No hydroureteronephrosis. No nephroureterolithiasis. No contour deforming renal mass. Foley catheter terminates within the urinary bladder lumen. The urinary bladder is decompressed. Bowel: No small or large bowel wall thickening or dilatation. The  appendix is unremarkable. Mesentery, Omentum, and Peritoneum: No simple free fluid ascites. No pneumoperitoneum. No mesenteric hematoma identified. No organized fluid collection. Pelvic Organs: The uterus and bilateral adnexal regions are unremarkable. Lymph Nodes: No abdominal, pelvic, inguinal lymphadenopathy. Vasculature: No abdominal aorta or iliac aneurysm. Musculoskeletal: No significant soft tissue hematoma. No acute pelvic fracture. No spinal fracture. IMPRESSION: 1. Interval development of mild hydrocephalus and periventricular edema. 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 The shunt is noted to be coiled within the neck. Recommend neurosurgery consultation. 2. No acute displaced fracture or traumatic listhesis of the cervical spine. 3. Ground-glass airspace opacity within the right lower lobe and patchy airspace opacity within left lower lobe. Findings may represent a combination of infection/inflammation as well as atelectasis. 4. No acute traumatic injury to the chest, abdomen, or pelvis with  limited evaluation on this noncontrast study. 5. No acute fracture or traumatic malalignment of the thoracic or lumbar spine. 6.  Aortic Atherosclerosis (ICD10-I70.0). 7. Interval development of an enlarged right thyroid gland with limited evaluation due to streak artifact. Recommend thyroid ultrasound (ref: J Am Coll Radiol. 2015 Feb;12(2): 143-50). These results were called by telephone at the time of interpretation on 05/21/2021 at 9:48 pm to provider [facility] , who verbally acknowledged these results. Electronically Signed   By: Iven Finn M.D.   On: 05/21/2021 21:54    EKG: Normal sinus rhythm  Weights: Filed Weights   05/25/21 0330 05/26/21 0320 05/28/21 0500  Weight: 85 kg 87.1 kg 85.5 kg     Physical Exam: Blood pressure (!) 143/81, pulse 98, temperature 98.6 F (37 C), resp. rate (!) 23, height _0  (1.575 m), weight 85.5 kg, SpO2 95 %. Body mass index is 34.48 kg/m. General: Well developed, well nourished, in no acute distress. Head eyes ears nose throat: Normocephalic, atraumatic, sclera non-icteric, no xanthomas, nares are without discharge. No apparent thyromegaly and/or mass  Lungs: Normal respiratory effort.  no wheezes, no rales, no rhonchi.  Heart: RRR with normal S1 S2. no murmur gallop, no rub, PMI is normal size and placement, carotid upstroke normal without bruit, jugular venous pressure is normal Abdomen: Soft, non-tender, non-distended with normoactive bowel sounds. No hepatomegaly. No rebound/guarding. No obvious abdominal masses. Abdominal aorta is normal size without bruit Extremities: No edema. no cyanosis, no clubbing, no ulcers  Peripheral : 2+ bilateral upper extremity pulses, 2+ bilateral femoral pulses, 2+ bilateral dorsal pedal pulse Neuro: Not very alert and oriented. No facial asymmetry. No focal deficit. Moves all extremities spontaneously. Musculoskeletal: Normal muscle tone without kyphosis Psych: Does not responds to questions appropriately with  a normal affect.    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  Patient understands further risk evaluation including the possibility of transesophageal echocardiogram.  This will evaluate the possibility of source of infection.  She understands there is risk including sedation esophageal perforation sore throat and side effects of medications.  She is at low risk for moderate sedation  Plan: 1.  Continue supportive care for infection sepsis and other rehabilitation 2.  Proceed to transesophageal echocardiogram for further evaluation of possible cardiac source of infection and/or sepsis  Signed, Corey Skains M.D. [facility] 05/28/2021, 4:52 PM

## 2021-05-29 ENCOUNTER — Inpatient Hospital Stay
Admit: 2021-05-29 | Discharge: 2021-05-29 | Disposition: A | Discharge status: Home / Self Care | Payer: Medicare (Managed Care) | Attending: Internal Medicine | Admitting: Internal Medicine

## 2021-05-29 ENCOUNTER — Inpatient Hospital Stay: Payer: Medicare (Managed Care)

## 2021-05-29 ENCOUNTER — Encounter: Payer: Self-pay | Admitting: Internal Medicine

## 2021-05-29 ENCOUNTER — Encounter
Admission: EM | Disposition: A | Discharge status: Skilled Nursing Facility | Payer: Medicare (Managed Care) | Source: Home / Self Care | Attending: Internal Medicine

## 2021-05-29 DIAGNOSIS — I639 Cerebral infarction, unspecified: Secondary | ICD-10-CM

## 2021-05-29 DIAGNOSIS — G9340 Encephalopathy, unspecified: Secondary | ICD-10-CM | POA: Diagnosis not present

## 2021-05-29 HISTORY — PX: TEE WITHOUT CARDIOVERSION: SHX5443

## 2021-05-29 LAB — BASIC METABOLIC PANEL
Anion gap: 3 — ABNORMAL LOW (ref 5–15)
BUN: 18 mg/dL (ref 8–23)
CO2: 29 mmol/L (ref 22–32)
Calcium: 7.5 mg/dL — ABNORMAL LOW (ref 8.9–10.3)
Chloride: 104 mmol/L (ref 98–111)
Creatinine, Ser: 0.84 mg/dL (ref 0.44–1.00)
GFR, Estimated: >60 mL/min (ref >60)
Glucose, Bld: 107 mg/dL — ABNORMAL HIGH (ref 70–99)
Potassium: 4.4 mmol/L (ref 3.5–5.1)
Sodium: 136 mmol/L (ref 135–145)

## 2021-05-29 LAB — CBC
HCT: 34 % — ABNORMAL LOW (ref 36.0–46.0)
Hemoglobin: 11.3 g/dL — ABNORMAL LOW (ref 12.0–15.0)
MCH: 32.7 pg (ref 26.0–34.0)
MCHC: 33.2 g/dL (ref 30.0–36.0)
MCV: 98.3 fL (ref 80.0–100.0)
Platelets: 322 10*3/uL (ref 150–400)
RBC: 3.46 MIL/uL — ABNORMAL LOW (ref 3.87–5.11)
RDW: 13.6 % (ref 11.5–15.5)
WBC: 14 10*3/uL — ABNORMAL HIGH (ref 4.0–10.5)
nRBC: 0 % (ref 0.0–0.2)

## 2021-05-29 LAB — GLUCOSE, CAPILLARY
Glucose-Capillary: 100 mg/dL — ABNORMAL HIGH (ref 70–99)
Glucose-Capillary: 105 mg/dL — ABNORMAL HIGH (ref 70–99)
Glucose-Capillary: 123 mg/dL — ABNORMAL HIGH (ref 70–99)
Glucose-Capillary: 134 mg/dL — ABNORMAL HIGH (ref 70–99)
Glucose-Capillary: 88 mg/dL (ref 70–99)
Glucose-Capillary: 90 mg/dL (ref 70–99)

## 2021-05-29 LAB — MAGNESIUM: Magnesium: 2.1 mg/dL (ref 1.7–2.4)

## 2021-05-29 LAB — HOMOCYSTEINE: Homocysteine: 17 umol/L (ref 0.0–17.2)

## 2021-05-29 LAB — PHOSPHORUS: Phosphorus: 3 mg/dL (ref 2.5–4.6)

## 2021-05-29 SURGERY — ECHOCARDIOGRAM, TRANSESOPHAGEAL
Anesthesia: Moderate Sedation

## 2021-05-29 MED ORDER — SODIUM CHLORIDE 0.9 % IV SOLN: INTRAVENOUS | Status: DC

## 2021-05-29 MED ORDER — LIDOCAINE VISCOUS HCL 2 % MT SOLN
OROMUCOSAL | Status: AC
  Filled 2021-05-29: qty 15

## 2021-05-29 MED ORDER — SODIUM CHLORIDE FLUSH 0.9 % IV SOLN
INTRAVENOUS | Status: AC
  Filled 2021-05-29: qty 10

## 2021-05-29 MED ORDER — BUTAMBEN-TETRACAINE-BENZOCAINE 2-2-14 % EX AERO
INHALATION_SPRAY | CUTANEOUS | Status: AC
  Filled 2021-05-29: qty 5

## 2021-05-29 MED ORDER — FENTANYL CITRATE (PF) 100 MCG/2ML IJ SOLN
INTRAMUSCULAR | Status: DC | PRN
  Administered 2021-05-29: 25 ug via INTRAVENOUS

## 2021-05-29 MED ORDER — ACETAMINOPHEN 325 MG PO TABS
650.0000 mg | ORAL_TABLET | Freq: Four times a day (QID) | ORAL | Status: DC | PRN
  Administered 2021-05-29 – 2021-06-06 (×3): 650 mg via ORAL
  Filled 2021-05-29 (×3): qty 2

## 2021-05-29 MED ORDER — MIDAZOLAM HCL 2 MG/2ML IJ SOLN
INTRAMUSCULAR | Status: AC
  Filled 2021-05-29: qty 2

## 2021-05-29 MED ORDER — FENTANYL CITRATE (PF) 100 MCG/2ML IJ SOLN
INTRAMUSCULAR | Status: AC
  Administered 2021-05-29: 25 ug
  Filled 2021-05-29: qty 2

## 2021-05-29 NOTE — Sedation Documentation (Signed)
NG Tube removed for TEE procedure. Patient tolerated well.

## 2021-05-29 NOTE — Progress Notes (Signed)
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 Pt went to OR to have shunt removed & EVD was placed. Pt remains in the ICU.  She is on tube feeds. Pharmacy consulted for electrolyte management.  Goal of Therapy:  Electrolytes within normal limits  Plan:  Electrolytes stable Will continue to follow along  Tawnya Crook, PharmD, BCPS Clinical Pharmacist 05/29/2021 2:04 PM

## 2021-05-29 NOTE — Progress Notes (Signed)
NAME:  Desiree Mitchell, MRN:  [id number], DOB:  Nov 28, 1955, LOS: 7 ADMISSION DATE:  05/21/2021  65 year old female presented at [facility] ED on 05/21/2021 from home via EMS after being found unresponsive by family.  Patient was last seen normal on Saturday, 05/19/2021 at 4 PM.  On EMS arrival patient was unresponsive and hypoxic with SPO2 in the 70s and a normal blood sugar, EMS had difficulty getting a blood pressure.  An IO was placed in the right humeral head and the patient received 300 mL of LR, 40 mg of lidocaine and 25 mg of Phenergan.  Family later reported to EDP that they found her sitting on a toilet slumped with her neck up against a basket. Of note patient arrived infested with bed bugs and roaches.  Significant labs: On admission:  Chemistry: Na+: 137, K+: 4.8, Cl: 97, BUN/Cr.:  84/2.81, Serum CO2/ AG: 17/23, AST: 155, ALT: 59, CK: 6755 Hematology: WBC: 30.3, Hgb: 15.9, Troponin: 22, Lactic/ PCT: 2.4 > 2.2/ pending, COVID-19 & Influenza A/B: negative VBG: 7.21/53/32/21.2 CXR 05/21/2021: Mild left basilar atelectasis without evidence of an acute infiltrate  Significant Hospital Events: Including procedures, antibiotic start and stop dates in addition to other pertinent events    10/10: Admitted or severe sepsis from infected Vp shunt 10/11: Removal of complete ventriculoatrial shunt and placement of left parietal approach ventriculostomy 10/11 Extubated successfully 10/12 SD status 10/13 Remains ICU status due to neuro checks 10/14 Remains in ICU due to need for neurochecks and external ventricular drain 10/15: Per neurosurg EVD drain adjusted to 25 cm H20 10/16: Neurology evaluation for multiple infarcts, EVD remains 10/17 remains with EVD  Significant Test:   CT Head without contrast 05/21/2021: Interval development of mild hydrocephalus and periventricular edema, associated malpositioned left VA shunt that terminates in the anterior mediastinum and no longer within the left  brachiocephalic vein.  Shunt noted to be coiled within the neck.  Groundglass opacity within the right lower lobe and patchy airspace opacity within the left lower lobe. CT Cervical Spine 05/21/2021: No acute fracture or traumatic injury noted.  Interval development of enlarged right thyroid gland. MR Brain 05/25/2021: Few scattered small acute infarcts involving different vascular territories. Ventricular catheter present with similar caliber of ventricles as compared to recent CT. Chronic microvascular ischemic changes, chronic infarcts, and chronic  microhemorrhages.  Micro Data:  10/10: COVID NEG 10/11: HIV NEG 10/10 BCID - proteus spp in 1of4 & staph spp in 2of4 (no resistances detected),  BCID 1of4 MSSE 10/10 BCx: GPCs & GNRs (pending speciation) 10/11 BCx:  10/11: CSF-NGTD   10/10: FLU/COV - negative  Antimicrobials:   Antibiotics Given (last 72 hours)     Date/Time Action Medication Dose Rate   05/26/21 1015 New Bag/Given   cefTRIAXone (ROCEPHIN) 2 g in sodium chloride 0.9 % 100 mL IVPB 2 g 200 mL/hr   05/26/21 1134 New Bag/Given   linezolid (ZYVOX) IVPB 600 mg 600 mg 300 mL/hr   05/26/21 2250 New Bag/Given   linezolid (ZYVOX) IVPB 600 mg 600 mg 300 mL/hr   05/27/21 1034 New Bag/Given   cefTRIAXone (ROCEPHIN) 2 g in sodium chloride 0.9 % 100 mL IVPB 2 g 200 mL/hr   05/27/21 1203 New Bag/Given   linezolid (ZYVOX) IVPB 600 mg 600 mg 300 mL/hr   05/27/21 2109 New Bag/Given   linezolid (ZYVOX) IVPB 600 mg 600 mg 300 mL/hr   05/28/21 7622 New Bag/Given   cefTRIAXone (ROCEPHIN) 2 g in sodium chloride 0.9 % 100 mL  IVPB 2 g 200 mL/hr   05/28/21 1039 New Bag/Given   linezolid (ZYVOX) IVPB 600 mg 600 mg 300 mL/hr   05/28/21 2140 New Bag/Given   linezolid (ZYVOX) IVPB 600 mg 600 mg 300 mL/hr      Scheduled Meds:  budesonide (PULMICORT) nebulizer solution  0.5 mg Nebulization BID   butamben-tetracaine-benzocaine       chlorhexidine gluconate (MEDLINE KIT)  15 mL Mouth Rinse  BID   Chlorhexidine Gluconate Cloth  6 each Topical Daily   collagenase   Topical Daily   cyanocobalamin  1,000 mcg Intramuscular Q0600   enoxaparin (LOVENOX) injection  40 mg Subcutaneous Q24H   fentaNYL       free water  30 mL Per Tube Q4H   Gerhardt's butt cream   Topical QID   ipratropium-albuterol  3 mL Nebulization Q6H   lidocaine       midazolam       pantoprazole (PROTONIX) IV  40 mg Intravenous QHS   sodium chloride flush  10-40 mL Intracatheter Q12H   sodium chloride flush       thiamine injection  100 mg Intravenous Daily   Continuous Infusions:  sodium chloride     cefTRIAXone (ROCEPHIN)  IV 2 g (05/28/21 0952)   feeding supplement (OSMOLITE 1.2 CAL) 70 mL/hr at 05/28/21 1900   linezolid (ZYVOX) IV 600 mg (05/28/21 2140)   PRN Meds:.albuterol, docusate sodium, labetalol, polyethylene glycol, sodium chloride flush    Interim History / Subjective:  More alert and awake Sepsis resolving  suspect her bacteremia is from her skin on her legs as well as newer lesions on her being down especially infected appearing wound on the posterior neck.   Objective   Blood pressure (!) 150/83, pulse 90, temperature 98.2 F (36.8 C), temperature source Oral, resp. rate (!) 21, height _0  (1.575 m), weight 86.4 kg, SpO2 96 %.        Intake/Output Summary (Last 24 hours) at 05/29/2021 0800 Last data filed at 05/29/2021 0600 Gross per 24 hour  Intake 2418.33 ml  Output 1171 ml  Net 1247.33 ml    Filed Weights   05/26/21 0320 05/28/21 0500 05/29/21 0500  Weight: 87.1 kg 85.5 kg 86.4 kg    Review of Systems:  No SOB and no pain Other:  All other systems negative  PHYSICAL EXAMINATION:  GENERAL:critically ill appearing,  EYES: Pupils equal, round, reactive to light.  No scleral icterus.  MOUTH: Moist mucosal membrane. NECK: Supple.  PULMONARY: +rhonchi,  CARDIOVASCULAR: S1 and S2.  No murmurs  GASTROINTESTINAL: Soft, nontender, -distended. Positive bowel sounds.   MUSCULOSKELETAL: No swelling, clubbing, or edema.  NEUROLOGIC: alert and awake SKIN:intact,warm,dry   Pressure Injury 05/22/21 Buttocks Right Deep Tissue Pressure Injury - Purple or maroon localized area of discolored intact skin or blood-filled blister due to damage of underlying soft tissue from pressure and/or shear. (Active)  05/22/21 0300  Location: Buttocks  Location Orientation: Right  Staging: Deep Tissue Pressure Injury - Purple or maroon localized area of discolored intact skin or blood-filled blister due to damage of underlying soft tissue from pressure and/or shear.  Wound Description (Comments):   Present on Admission: Yes     Pressure Injury 05/22/21 Coccyx Stage 2 -  Partial thickness loss of dermis presenting as a shallow open injury with a red, pink wound bed without slough. (Active)  05/22/21 0300  Location: Coccyx  Location Orientation:   Staging: Stage 2 -  Partial thickness loss of dermis presenting  as a shallow open injury with a red, pink wound bed without slough.  Wound Description (Comments):   Present on Admission: Yes     Pressure Injury 05/22/21 Cervical Deep Tissue Pressure Injury - Purple or maroon localized area of discolored intact skin or blood-filled blister due to damage of underlying soft tissue from pressure and/or shear. (Active)  05/22/21 0300  Location: Cervical  Location Orientation:   Staging: Deep Tissue Pressure Injury - Purple or maroon localized area of discolored intact skin or blood-filled blister due to damage of underlying soft tissue from pressure and/or shear.  Wound Description (Comments):   Present on Admission: Yes    Labs/imaging that I havepersonally reviewed  (right click and "Reselect all SmartList Selections" daily)   SYNOPSIS 65 yo admitted for sepsis and   suspect her bacteremia is from her skin on her legs as well as newer lesions on her being down especially infected appearing wound on the posterior neck. s/p post  resp failure resolved with EVD in place, renal failure improving   Assessment & Plan:  Acute Respiratory Failure in the postoperative setting after neurosurgical VA shunt removal with EVD placement  Extubated Wean fio2 Pulmonary hygene BD therapy Lasix as needed -Continue bronchodilators  Rhabdomyolysis secondary to hypovolemia in the setting of dehydration Increased Anion Gap Metabolic Acidosis - resolved Acute Kidney Injury-improving   Severe sepsis without shock due to infected VA shunt in the setting of shock malfunction SOURCE- suspect her bacteremia is from her skin on her legs as well as newer lesions on her being down especially infected appearing wound on the posterior neck. -follow up cultures - ID consulted appreciate input~will need linezolid x14 days for GP coverage given skin/soft tissue infection and continue ceftriaxone   VA Shunt Malfunction s/p EVD placement 10/11 Acute encephalopathy~MR Brain 10/14 revealed few scattered small acute infarcts involving different vascular territories  - Management by neurosurgery appreciate input~Repeat CT Head 10/17 pending  - Neurology consulted for multiple strokes appreciate input    Posterior Neck, Buttocks & Coccyx Pressure Injuries secondary to being found down at home for unknown amount of time - WOC consult  Nutrition Status: Started TF's    Best Practice (right click and "Reselect all SmartList Selections" daily)  Diet/type: NPO, dietitian consult for tube feeds DVT prophylaxis: SCD GI prophylaxis: PPI Lines: Central line, still needed Foley:  Yes, and it is still needed Code Status:  full code  Labs   CBC: Recent Labs  Lab 05/23/21 0430 05/24/21 0403 05/25/21 0517 05/26/21 0406 05/27/21 0541 05/28/21 0458 05/29/21 0449  WBC 19.0*   < > 17.2* 14.5* 12.9* 12.8* 14.0*  NEUTROABS 16.4*  --   --   --   --   --   --   HGB 13.4   < > 13.6 11.8* 11.6* 11.6* 11.3*  HCT 39.5   < > 40.4 36.5 35.2* 36.5 34.0*   MCV 96.1   < > 100.0 101.1* 99.2 98.1 98.3  PLT 401*   < > 379 340 314 317 322   < > = values in this interval not displayed.     Basic Metabolic Panel: Recent Labs  Lab 05/25/21 0517 05/25/21 2005 05/26/21 0406 05/26/21 1254 05/27/21 0010 05/27/21 0541 05/27/21 2117 05/28/21 0458 05/29/21 0449  NA 151*   < > 146*   < > 139 138 135 136 136  K 4.2  --  3.9  --   --  4.4  --  4.0 4.4  CL  118*  --  112*  --   --  104  --  100 104  CO2 25  --  27  --   --  27  --  28 29  GLUCOSE 118*  --  142*  --   --  113*  --  148* 107*  BUN 46*  --  31*  --   --  23  --  21 18  CREATININE 1.19*  --  1.05*  --   --  0.88  --  0.89 0.84  CALCIUM 8.1*  --  7.4*  --   --  7.5*  --  7.7* 7.5*  MG 3.1*  --  3.0*  --   --  2.4  --  2.2 2.1  PHOS 2.8  --  2.4*  --   --  3.3  --  3.3 3.0   < > = values in this interval not displayed.    GFR: Estimated Creatinine Clearance: 69.9 mL/min (by C-G formula based on SCr of 0.84 mg/dL). Recent Labs  Lab 05/23/21 0430 05/24/21 0358 05/24/21 0403 05/26/21 0406 05/27/21 0541 05/28/21 0458 05/29/21 0449  PROCALCITON 1.11 0.47  --   --   --   --   --   WBC 19.0*  --    < > 14.5* 12.9* 12.8* 14.0*   < > = values in this interval not displayed.     Liver Function Tests: Recent Labs  Lab 05/25/21 0517  AST 47*  ALT 41  ALKPHOS 58  BILITOT 1.0  PROT 6.2*  ALBUMIN 2.5*    No results for input(s): LIPASE, AMYLASE in the last 168 hours. No results for input(s): AMMONIA in the last 168 hours.   ABG    Component Value Date/Time   PHART 7.45 05/23/2021 0940   PCO2ART 32 05/23/2021 0940   PO2ART 80 (L) 05/23/2021 0940   HCO3 22.2 05/23/2021 0940   ACIDBASEDEF 1.0 05/23/2021 0940   O2SAT 96.3 05/23/2021 0940      Coagulation Profile: No results for input(s): INR, PROTIME in the last 168 hours.  Cardiac Enzymes: Recent Labs  Lab 05/25/21 0517 05/26/21 0406 05/27/21 0541 05/28/21 0458  CKTOTAL 430* 254* 157 136     HbA1C: Hgb  A1c MFr Bld  Date/Time Value Ref Range Status  05/26/2021 04:06 AM 5.8 (H) 4.8 - 5.6 % Final    Comment:    (NOTE)         Prediabetes: 5.7 - 6.4         Diabetes: >6.4         Glycemic control for adults with diabetes: <7.0   05/22/2021 05:20 AM 5.8 (H) 4.8 - 5.6 % Final    Comment:    (NOTE)         Prediabetes: 5.7 - 6.4         Diabetes: >6.4         Glycemic control for adults with diabetes: <7.0     CBG: Recent Labs  Lab 05/28/21 1556 05/28/21 1929 05/28/21 2350 05/29/21 0358 05/29/21 0723  GLUCAP 113* 115* 139* 100* 90     Allergies No Known Allergies    Critical Care Time devoted to patient care services described in this note is 35 minutes.  Critical care was necessary to treat /prevent imminent and life-threatening deterioration. Overall, patient is critically ill, prognosis is guarded.     Corrin Parker, M.D.  [facility]  Medical Director  [facility]

## 2021-05-29 NOTE — Progress Notes (Addendum)
0820 Down to Specials for TTE. After several tries Dr. Nehemiah Massed unable to pass scope due to patients anatomy. 0915 Back to ICU via bed. Right nare NG tube removed during procedure. Not replaced. Swallow eval. Ordered per Dr. Mortimer Fries.

## 2021-05-29 NOTE — Progress Notes (Signed)
*  PRELIMINARY RESULTS* Echocardiogram Echocardiogram Transesophageal has been performed.  Desiree Mitchell 05/29/2021, 9:06 AM

## 2021-05-29 NOTE — Progress Notes (Signed)
Alert most of the day. Talkative at times.Still has short term memory loss and has a history of short term memory loss per her brother. Still request food. Tube feeding restarted at 1700 after no swallow evaluation done today. Brother and friend in to visit. Brother confirmed that her children do not interact. Discussed with brother that patient needed to be placed in rehab. after hospitalization.Brother agrees.

## 2021-05-29 NOTE — Progress Notes (Signed)
[facility] Card[facility] Encounter Note  Patient: Desiree Mitchell / Admit Date: 05/21/2021 / Date of Encounter: 05/29/2021, 2:23 PM   Subjective: Patient overall improved from yesterday.  Less somnolent today and able to converse fairly well.  No evidence of significant mental status changes.  There has been concerns for the possibility of causes of her bacteremia and other infection including the possibility of endocarditis.  Echocardiogram reviewed showing normal LV systolic function and no evidence of valvular heart disease making her low risk for the possibility of true endocarditis requiring further intervention.  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  Smaller probe was not available at the time.  Therefore, patient is at lower risk for concerns of endocarditis we will consider other choices  Review of Systems: Positive for: Shortness of breath cough Negative for: Vision change, hearing change, syncope, dizziness, nausea, vomiting,diarrhea, bloody stool, stomach pain congestion, diaphoresis, urinary frequency, urinary pain,skin lesions, skin rashes Others previously listed  Objective: Telemetry: Tachycardia Physical Exam: Blood pressure (!) 151/94, pulse 94, temperature (!) 97.4 F (36.3 C), resp. rate 17, height 5' 2"  (1.575 m), weight 86.4 kg, SpO2 93 %. Body mass index is 34.84 kg/m. General: Well developed, well nourished, in no acute distress. Head: Normocephalic, atraumatic, sclera non-icteric, no xanthomas, nares are without discharge. Neck: No apparent masses Lungs: Normal respirations with no wheezes, diffuse rhonchi, no rales , few crackles   Heart: Regular rate and rhythm, normal S1 S2, no murmur, no rub, no gallop, PMI is normal size and placement, carotid upstroke normal without bruit, jugular venous pressure normal Abdomen: Soft, non-tender, distended  with normoactive bowel sounds. No hepatosplenomegaly. Abdominal aorta is normal size without bruit Extremities: Trace edema, no clubbing, no cyanosis, no ulcers,  Peripheral: 2+ radial, 2+ femoral, 2+ dorsal pedal pulses Neuro: Alert and oriented. Moves all extremities spontaneously. Psych:  Responds to questions appropriately with a normal affect.   Intake/Output Summary (Last 24 hours) at 05/29/2021 1423 Last data filed at 05/29/2021 1200 Gross per 24 hour  Intake 2428.33 ml  Output 845 ml  Net 1583.33 ml    Inpatient Medications:  . budesonide (PULMICORT) nebulizer solution  0.5 mg Nebulization BID  . chlorhexidine gluconate (MEDLINE KIT)  15 mL Mouth Rinse BID  . Chlorhexidine Gluconate Cloth  6 each Topical Daily  . collagenase   Topical Daily  . cyanocobalamin  1,000 mcg Intramuscular Q0600  . enoxaparin (LOVENOX) injection  40 mg Subcutaneous Q24H  . free water  30 mL Per Tube Q4H  . Gerhardt's butt cream   Topical QID  . ipratropium-albuterol  3 mL Nebulization Q6H  . lidocaine      . pantoprazole (PROTONIX) IV  40 mg Intravenous QHS  . sodium chloride flush  10-40 mL Intracatheter Q12H  . thiamine injection  100 mg Intravenous Daily   Infusions:  . cefTRIAXone (ROCEPHIN)  IV 2 g (05/29/21 5625)  . feeding supplement (OSMOLITE 1.2 CAL) 70 mL/hr at 05/28/21 1900  . linezolid (ZYVOX) IV 600 mg (05/29/21 1107)    Labs: Recent Labs    05/28/21 0458 05/29/21 0449  NA 136 136  K 4.0 4.4  CL 100 104  CO2 28 29  GLUCOSE 148* 107*  BUN 21 18  CREATININE 0.89 0.84  CALCIUM 7.7* 7.5*  MG 2.2 2.1  PHOS 3.3 3.0   No results for input(s): AST, ALT, ALKPHOS, BILITOT, PROT, ALBUMIN in the last  72 hours. Recent Labs    05/28/21 0458 05/29/21 0449  WBC 12.8* 14.0*  HGB 11.6* 11.3*  HCT 36.5 34.0*  MCV 98.1 98.3  PLT 317 322   Recent Labs    05/27/21 0541 05/28/21 0458  CKTOTAL 157 136   Invalid input(s): POCBNP No results for input(s): HGBA1C in the last 72  hours.   Weights: Filed Weights   05/26/21 0320 05/28/21 0500 05/29/21 0500  Weight: 87.1 kg 85.5 kg 86.4 kg     Radiology/Studies:  DG Abd 1 View  Result Date: 05/22/2021 CLINICAL DATA:  Intubation and OG tube placement EXAM: PORTABLE CHEST 1 VIEW COMPARISON:  Chest 05/21/2021 FINDINGS: An endotracheal tube has been placed with its tip measuring 4.6 cm above the carina. An enteric tube has been placed with its tip in the right upper quadrant consistent with location in the distal stomach. Pre-existing right central venous catheter. Shallow inspiration. Heart size and pulmonary vascularity are normal. Linear atelectasis in the lung bases, progressing since prior study. No pleural effusions. No pneumothorax. Mediastinal contours appear intact. Visualized bowel gas pattern is normal. IMPRESSION: Appliances appear in satisfactory location. Electronically Signed   By: Lucienne Capers M.D.   On: 05/22/2021 03:55   CT HEAD WO CONTRAST (5MM)  Result Date: 05/28/2021 CLINICAL DATA:  Hydrocephalus.  Found unresponsive. EXAM: CT HEAD WITHOUT CONTRAST TECHNIQUE: Contiguous axial images were obtained from the base of the skull through the vertex without intravenous contrast. COMPARISON:  Head CT 05/24/2021.  MRI 05/25/2021. FINDINGS: Brain: No appreciable change since the CT of 4 days ago. VP shunt enters from a left parietal approach position of the catheter is unchanged and ventricular size is unchanged. Catheter appears to communicate with both the occipital horn of the left lateral ventricle and the third ventricle. Extensive chronic low-density seen throughout the cerebral hemispheric white matter. 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 Subacute infarction in the right basal ganglia appears similar. No sign of new infarction, mass lesion, hemorrhage or extra-axial collection. Vascular: There is atherosclerotic calcification of the major vessels  at the base of the brain. Skull: Negative except for previous burr holes. Sinuses/Orbits: Clear/normal Other: None IMPRESSION: Stable CT study. VP shunt in place with stable ventricular size. Extensive chronic ischemic changes as described above. Subacute infarction in the right basal ganglia/radiating white matter tracts as seen previously. Electronically Signed   By: Nelson Chimes M.D.   On: 05/28/2021 12:48   CT HEAD WO CONTRAST (5MM)  Result Date: 05/24/2021 CLINICAL DATA:  Hydrocephalus EXAM: CT HEAD WITHOUT CONTRAST TECHNIQUE: Contiguous axial images were obtained from the base of the skull through the vertex without intravenous contrast. COMPARISON:  05/23/2021 FINDINGS: Brain: Left parietal approach ventriculostomy catheter terminates in the inferior right frontal lobe, unchanged. Overall unchanged size and configuration of ventricular system compared to 05/23/2021 and 05/21/2021. No acute infarction, hemorrhage, extra-axial collection, mass, mass effect, or midline shift. Periventricular white matter hypodensity, unchanged, which may represent transependymal flow of CSF and/or sequela of chronic small vessel ischemic disease. Vascular: No hyperdense vessel. Skull: Right frontal and left parietal burr holes. No acute fracture. Sinuses/Orbits: Negative. Other: The mastoids are well aerated. IMPRESSION: Unchanged size and configuration of the ventricular system, with unchanged position of the left parietal approach ventriculostomy catheter. Unchanged periventricular hypodensity, which may represent transependymal flow CSF and/or chronic small vessel ischemic disease. Electronically Signed   By: Merilyn Baba M.D.   On: 05/24/2021 11:52   CT  HEAD WO CONTRAST (5MM)  Result Date: 05/23/2021 CLINICAL DATA:  Hydrocephalus EXAM: CT HEAD WITHOUT CONTRAST TECHNIQUE: Contiguous axial images were obtained from the base of the skull through the vertex without intravenous contrast. COMPARISON:  05/21/2021.  FINDINGS: Brain: Status post interval shunt revision, with left parietal approach ventriculostomy catheter traversing the left lateral ventricle and third ventricle and terminating in inferior right frontal lobe. Overall unchanged size and configuration of the ventricular system compared to 05/21/2021. No acute infarction, hemorrhage, extra-axial collection, mass, mass effect, or midline shift. Periventricular white matter hypodensity, likely transependymal flow of CSF in addition to the sequela of chronic small vessel ischemic disease. Vascular: No hyperdense vessel. Skull: Right frontal and left parietal burr holes. Negative for acute fracture. Sinuses/Orbits: Minimal mucosal thickening in the left maxillary sinus. The orbits are unremarkable. Other: The mastoids are well aerated. Superficial skin staples overlying the left parietal scalp, with externalization of the extracranial ventriculostomy catheter. IMPRESSION: Status post interval shunt revision, with left parietal approach ventriculostomy catheter terminating in the inferior right frontal lobe. Overall unchanged size and configuration of the ventricular system, with redemonstrated periventricular hypodensity, likely transependymal flow of CSF in addition to chronic small vessel ischemic disease. Electronically Signed   By: Merilyn Baba M.D.   On: 05/23/2021 11:09   CT HEAD WO CONTRAST (5MM)  Result Date: 05/21/2021 CLINICAL DATA:  Found unresponsive.  VP shunt in place. EXAM: CT HEAD WITHOUT CONTRAST CT CERVICAL SPINE WITHOUT CONTRAST CT CHEST, ABDOMEN AND PELVIS WITHOUT CONTRAST TECHNIQUE: Contiguous axial images were obtained from the base of the skull through the vertex without intravenous contrast. Multidetector CT imaging of the cervical spine was performed without intravenous contrast. Multiplanar CT image reconstructions were also generated. Multidetector CT imaging of the chest, abdomen and pelvis was performed following the standard protocol  without IV contrast. COMPARISON:  Pet CT 01/16/2011, chest x-ray 11/05/2012, CT miscellaneous 10/03/2011 FINDINGS: CT HEAD FINDINGS BRAIN: BRAIN Left posterior approach ventriculoperitoneal shunt with tip terminating midline along the anterior left lateral ventricle. Cerebral volume loss as well as interval development of mild hydrocephalus with surrounding periventricular edema. Patchy and confluent areas of decreased attenuation are noted throughout the deep and periventricular white matter of the cerebral hemispheres bilaterally, compatible with chronic microvascular ischemic disease. No evidence of large-territorial acute infarction. No parenchymal hemorrhage. No mass lesion. No extra-axial collection. No mass effect or midline shift. No hydrocephalus. Basilar cisterns are patent. Vascular: No hyperdense vessel. Atherosclerotic calcifications are present within the cavernous internal carotid arteries. Skull: No acute fracture or focal lesion. Sinuses/Orbits: Paranasal sinuses and mastoid air cells are clear. The orbits are unremarkable. Other: None. CT CERVICAL FINDINGS Alignment: Normal. Skull base and vertebrae: No acute fracture. No aggressive appearing focal osseous lesion or focal pathologic process. Soft tissues and spinal canal: No prevertebral fluid or swelling. No visible canal hematoma. Upper chest: Unremarkable. Other: Left ventricular peritoneal shunt is noted to coil within the soft tissues of the neck. CT CHEST: Ports and Devices: Right chest wall Port-A-Cath with tip at the level of the superior cavoatrial junction. Left VA shunt terminates in the anterior mediastinum and no longer within the left brachiocephalic vein as noted on CT miscellaneous 10/03/2011. Lungs/airways: Ground-glass airspace opacity within the right lower lobe and patchy airspace opacity within left lower lobe. No focal consolidation. No pulmonary nodule. No pulmonary mass. No pulmonary contusion or laceration. No pneumatocele  formation. The central airways are patent. Pleura: No pleural effusion. No pneumothorax. No hemothorax. Lymph Nodes: Limited evaluation for  hilar lymphadenopathy on this noncontrast study. No mediastinal or axillary lymphadenopathy. Right axillary surgical clips. Mediastinum: No pneumomediastinum. No fat stranding within the mediastinum. No hematoma formation. The thoracic aorta is normal in caliber. The heart is normal in size. No significant pericardial effusion. The esophagus is unremarkable. Interval development of an enlarged right thyroid gland with limited evaluation due to streak artifact. Chest Wall / Breasts: No chest wall mass. Musculoskeletal: No acute rib or sternal fracture. No spinal fracture. CT ABDOMEN / PELVIS: Liver: Not enlarged. No focal lesion. Biliary System: Limited evaluation due to motion artifact. The gallbladder is otherwise unremarkable with no radio-opaque gallstones. No biliary ductal dilatation. Pancreas: Normal pancreatic contour. No main pancreatic duct dilatation. Spleen: Not enlarged. No focal lesion. Adrenal Glands: No nodularity bilaterally. Kidneys: No hydroureteronephrosis. No nephroureterolithiasis. No contour deforming renal mass. Foley catheter terminates within the urinary bladder lumen. The urinary bladder is decompressed. Bowel: No small or large bowel wall thickening or dilatation. The appendix is unremarkable. Mesentery, Omentum, and Peritoneum: No simple free fluid ascites. No pneumoperitoneum. No mesenteric hematoma identified. No organized fluid collection. Pelvic Organs: The uterus and bilateral adnexal regions are unremarkable. Lymph Nodes: No abdominal, pelvic, inguinal lymphadenopathy. Vasculature: No abdominal aorta or iliac aneurysm. Musculoskeletal: No significant soft tissue hematoma. No acute pelvic fracture. No spinal fracture. IMPRESSION: 1. Interval development of mild hydrocephalus and periventricular edema. Associated malpositioned left VA shunt that  terminates in the anterior mediastinum and no longer within the left brachiocephalic vein as noted on CT miscellaneous 10/03/2011. The shunt is noted to be coiled within the neck. Recommend neurosurgery consultation. 2. No acute displaced fracture or traumatic listhesis of the cervical spine. 3. Ground-glass airspace opacity within the right lower lobe and patchy airspace opacity within left lower lobe. Findings may represent a combination of infection/inflammation as well as atelectasis. 4. No acute traumatic injury to the chest, abdomen, or pelvis with limited evaluation on this noncontrast study. 5. No acute fracture or traumatic malalignment of the thoracic or lumbar spine. 6.  Aortic Atherosclerosis (ICD10-I70.0). 7. Interval development of an enlarged right thyroid gland with limited evaluation due to streak artifact. Recommend thyroid ultrasound (ref: J Am Coll Radiol. 2015 Feb;12(2): 143-50). These results were called by telephone at the time of interpretation on 05/21/2021 at 9:48 pm to provider [facility] , who verbally acknowledged these results. Electronically Signed   By: Iven Finn M.D.   On: 05/21/2021 21:54   CT Cervical Spine Wo Contrast  Result Date: 05/21/2021 CLINICAL DATA:  Found unresponsive.  VP shunt in place. EXAM: CT HEAD WITHOUT CONTRAST CT CERVICAL SPINE WITHOUT CONTRAST CT CHEST, ABDOMEN AND PELVIS WITHOUT CONTRAST TECHNIQUE: Contiguous axial images were obtained from the base of the skull through the vertex without intravenous contrast. Multidetector CT imaging of the cervical spine was performed without intravenous contrast. Multiplanar CT image reconstructions were also generated. Multidetector CT imaging of the chest, abdomen and pelvis was performed following the standard protocol without IV contrast. COMPARISON:  Pet CT 01/16/2011, chest x-ray 11/05/2012, CT miscellaneous 10/03/2011 FINDINGS: CT HEAD FINDINGS BRAIN: BRAIN Left posterior approach ventriculoperitoneal shunt  with tip terminating midline along the anterior left lateral ventricle. Cerebral volume loss as well as interval development of mild hydrocephalus with surrounding periventricular edema. Patchy and confluent areas of decreased attenuation are noted throughout the deep and periventricular white matter of the cerebral hemispheres bilaterally, compatible with chronic microvascular ischemic disease. No evidence of large-territorial acute infarction. No parenchymal hemorrhage. No mass lesion. No extra-axial collection.  No mass effect or midline shift. No hydrocephalus. Basilar cisterns are patent. Vascular: No hyperdense vessel. Atherosclerotic calcifications are present within the cavernous internal carotid arteries. Skull: No acute fracture or focal lesion. Sinuses/Orbits: Paranasal sinuses and mastoid air cells are clear. The orbits are unremarkable. Other: None. CT CERVICAL FINDINGS Alignment: Normal. Skull base and vertebrae: No acute fracture. No aggressive appearing focal osseous lesion or focal pathologic process. Soft tissues and spinal canal: No prevertebral fluid or swelling. No visible canal hematoma. Upper chest: Unremarkable. Other: Left ventricular peritoneal shunt is noted to coil within the soft tissues of the neck. CT CHEST: Ports and Devices: Right chest wall Port-A-Cath with tip at the level of the superior cavoatrial junction. Left VA shunt terminates in the anterior mediastinum and no longer within the left brachiocephalic vein as noted on CT miscellaneous 10/03/2011. Lungs/airways: Ground-glass airspace opacity within the right lower lobe and patchy airspace opacity within left lower lobe. No focal consolidation. No pulmonary nodule. No pulmonary mass. No pulmonary contusion or laceration. No pneumatocele formation. The central airways are patent. Pleura: No pleural effusion. No pneumothorax. No hemothorax. Lymph Nodes: Limited evaluation for hilar lymphadenopathy on this noncontrast study. No  mediastinal or axillary lymphadenopathy. Right axillary surgical clips. Mediastinum: No pneumomediastinum. No fat stranding within the mediastinum. No hematoma formation. The thoracic aorta is normal in caliber. The heart is normal in size. No significant pericardial effusion. The esophagus is unremarkable. Interval development of an enlarged right thyroid gland with limited evaluation due to streak artifact. Chest Wall / Breasts: No chest wall mass. Musculoskeletal: No acute rib or sternal fracture. No spinal fracture. CT ABDOMEN / PELVIS: Liver: Not enlarged. No focal lesion. Biliary System: Limited evaluation due to motion artifact. The gallbladder is otherwise unremarkable with no radio-opaque gallstones. No biliary ductal dilatation. Pancreas: Normal pancreatic contour. No main pancreatic duct dilatation. Spleen: Not enlarged. No focal lesion. Adrenal Glands: No nodularity bilaterally. Kidneys: No hydroureteronephrosis. No nephroureterolithiasis. No contour deforming renal mass. Foley catheter terminates within the urinary bladder lumen. The urinary bladder is decompressed. Bowel: No small or large bowel wall thickening or dilatation. The appendix is unremarkable. Mesentery, Omentum, and Peritoneum: No simple free fluid ascites. No pneumoperitoneum. No mesenteric hematoma identified. No organized fluid collection. Pelvic Organs: The uterus and bilateral adnexal regions are unremarkable. Lymph Nodes: No abdominal, pelvic, inguinal lymphadenopathy. Vasculature: No abdominal aorta or iliac aneurysm. Musculoskeletal: No significant soft tissue hematoma. No acute pelvic fracture. No spinal fracture. IMPRESSION: 1. Interval development of mild hydrocephalus and periventricular edema. 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 The shunt is noted to be coiled within the neck. Recommend  neurosurgery consultation. 2. No acute displaced fracture or traumatic listhesis of the cervical spine. 3. Ground-glass airspace opacity within the right lower lobe and patchy airspace opacity within left lower lobe. Findings may represent a combination of infection/inflammation as well as atelectasis. 4. No acute traumatic injury to the chest, abdomen, or pelvis with limited evaluation on this noncontrast study. 5. No acute fracture or traumatic malalignment of the thoracic or lumbar spine. 6.  Aortic Atherosclerosis (ICD10-I70.0). 7. Interval development of an enlarged right thyroid gland with limited evaluation due to streak artifact. Recommend thyroid ultrasound (ref: J Am Coll Radiol. 2015 Feb;12(2): 143-50). These results were called by telephone at the time of interpretation on 05/21/2021 at 9:48 pm to provider [facility] , who verbally acknowledged these results.  Electronically Signed   By: Iven Finn M.D.   On: 05/21/2021 21:54   MR BRAIN WO CONTRAST  Result Date: 05/25/2021 CLINICAL DATA:  Neuro deficit, acute, stroke suspected EXAM: MRI HEAD WITHOUT CONTRAST TECHNIQUE: Multiplanar, multiecho pulse sequences of the brain and surrounding structures were obtained without intravenous contrast. COMPARISON:  Correlation made with prior CTs FINDINGS: Brain: There are a few scattered small foci of reduced diffusion including involvement of the right basal ganglia and corona radiata, left basal ganglia, right inferior parietal cortex, parasagittal left frontal cortex, and anterior right cerebellum. Left posterior approach ventricular catheter extends to the right of midline traversing the third ventricle with tip extending laterally between frontal horn and third ventricle. Prior right frontal catheter tract. Ventricles are similar in size to recent CT imaging. Right anterior temporal encephalomalacia. 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 Chronic infarcts and/or perivascular  spaces along the central cerebral white matter and deep gray nuclei. Patchy and confluent areas of T2 hyperintensity in the supratentorial white matter may reflect chronic microvascular ischemic changes. Scattered foci of susceptibility likely reflecting chronic microhemorrhages due to hypertension. No intracranial mass or mass effect. Vascular: Major vessel flow voids at the sk few scattered small acute ull base are preserved. Skull and upper cervical spine: Normal marrow signal is preserved. Sinuses/Orbits: Paranasal sinuses are aerated. Orbits are unremarkable. Other: Sella is unremarkable.  Mastoid air cells are clear. IMPRESSION: Few scattered small acute infarcts involving different vascular territories. Ventricular catheter present with similar caliber of ventricles as compared to recent CT. Chronic microvascular ischemic changes, chronic infarcts, and chronic microhemorrhages. Electronically Signed   By: Macy Mis M.D.   On: 05/25/2021 15:31   DG Chest Port 1 View  Result Date: 05/24/2021 CLINICAL DATA:  Hypoxia EXAM: PORTABLE CHEST 1 VIEW COMPARISON:  05/22/2021 FINDINGS: Endotracheal tube and NG tube removed. Port-A-Cath tip in the SVC unchanged. Cardiac enlargement.  Negative for heart failure or edema Progression of left lower lobe consolidation. Mild improvement in right lower lobe atelectasis. No significant effusion. IMPRESSION: Endotracheal tube removed. Progression of left lower lobe atelectasis with improvement in right lower lobe atelectasis. Electronically Signed   By: Franchot Gallo M.D.   On: 05/24/2021 10:50   DG Chest Port 1 View  Result Date: 05/22/2021 CLINICAL DATA:  Intubation and OG tube placement EXAM: PORTABLE CHEST 1 VIEW COMPARISON:  Chest 05/21/2021 FINDINGS: An endotracheal tube has been placed with its tip measuring 4.6 cm above the carina. An enteric tube has been placed with its tip in the right upper quadrant consistent with location in the distal stomach.  Pre-existing right central venous catheter. Shallow inspiration. Heart size and pulmonary vascularity are normal. Linear atelectasis in the lung bases, progressing since prior study. No pleural effusions. No pneumothorax. Mediastinal contours appear intact. Visualized bowel gas pattern is normal. IMPRESSION: Appliances appear in satisfactory location. Electronically Signed   By: Lucienne Capers M.D.   On: 05/22/2021 03:55   DG Chest Portable 1 View  Result Date: 05/21/2021 CLINICAL DATA:  Found unresponsive. EXAM: PORTABLE CHEST 1 VIEW COMPARISON:  None. FINDINGS: A right-sided venous Port-A-Cath is seen with its distal tip noted at the junction of the superior vena cava and right atrium. Intact shunt tubing is seen overlying the left neck soft tissues and left upper lung. Very mild atelectasis is seen within the left lung base. There is no evidence of acute infiltrate, pleural effusion or pneumothorax. The heart size and mediastinal contours are within normal limits. The  visualized skeletal structures are unremarkable. IMPRESSION: 1. Very mild left basilar atelectasis without evidence of an acute infiltrate. Electronically Signed   By: Virgina Norfolk M.D.   On: 05/21/2021 19:32   DG Abd Portable 1V  Result Date: 05/28/2021 CLINICAL DATA:  NG tube placement EXAM: PORTABLE ABDOMEN - 1 VIEW COMPARISON:  05/26/2021 FINDINGS: Limited radiograph of the lower chest and upper abdomen was obtained for the purposes of enteric tube localization. Enteric tube is seen coursing below the diaphragm with distal tip and side port terminating within the expected location of the gastric body. IMPRESSION: Enteric tube tip and side port are seen within the gastric body. Electronically Signed   By: Davina Poke D.O.   On: 05/28/2021 10:53   DG Abd Portable 1V  Result Date: 05/26/2021 CLINICAL DATA:  Check gastric catheter placement EXAM: PORTABLE ABDOMEN - 1 VIEW COMPARISON:  05/22/2021 FINDINGS: Gastric catheter  is noted within the stomach. It is less deep than that seen on the prior exam. No obstructive changes are seen. IMPRESSION: Gastric catheter within the stomach. Electronically Signed   By: Inez Catalina M.D.   On: 05/26/2021 19:00   ECHOCARDIOGRAM COMPLETE  Result Date: 05/27/2021    ECHOCARDIOGRAM REPORT   Patient Name:   Desiree Mitchell Date of Exam: 05/26/2021 Medical Rec #:  [id number]     Height:       62.0 in Accession #:    [id number]    Weight:       192.0 lb Date of Birth:  [birth year]     BSA:          1.879 m Patient Age:    65 years      BP:           155/75 mmHg Patient Gender: F             HR:           92 bpm. Exam Location:  [facility] Procedure: 2D Echo, Cardiac Doppler and Color Doppler Indications:     Stroke I63.9  History:         Patient has no prior history of Echocardiogram examinations.  Sonographer:     Alyse Low Roar Referring Phys:  [id number] Bradly Bienenstock Diagnosing Phys: Isaias Cowman MD IMPRESSIONS  1. Left ventricular ejection fraction, by estimation, is 50 to 55%. The left ventricle has low normal function. The left ventricle has no regional wall motion abnormalities. Left ventricular diastolic parameters are consistent with Grade I diastolic dysfunction (impaired relaxation).  2. Right ventricular systolic function is normal. The right ventricular size is normal.  3. The mitral valve is normal in structure. Trivial mitral valve regurgitation. No evidence of mitral stenosis.  4. The aortic valve is normal in structure. Aortic valve regurgitation is not visualized. No aortic stenosis is present.  5. The inferior vena cava is normal in size with greater than 50% respiratory variability, suggesting right atrial pressure of 3 mmHg. FINDINGS  Left Ventricle: Left ventricular ejection fraction, by estimation, is 50 to 55%. The left ventricle has low normal function. The left ventricle has no regional wall motion abnormalities. The left ventricular internal cavity size was normal in size.  There is no left ventricular hypertrophy. Left ventricular diastolic parameters are consistent with Grade I diastolic dysfunction (impaired relaxation). Right Ventricle: The right ventricular size is normal. No increase in right ventricular wall thickness. Right ventricular systolic function is normal. Left Atrium: Left atrial size was normal in size. Right Atrium: Right atrial  size was normal in size. Pericardium: There is no evidence of pericardial effusion. Mitral Valve: The mitral valve is normal in structure. Trivial mitral valve regurgitation. No evidence of mitral valve stenosis. Tricuspid Valve: The tricuspid valve is normal in structure. Tricuspid valve regurgitation is trivial. No evidence of tricuspid stenosis. Aortic Valve: The aortic valve is normal in structure. Aortic valve regurgitation is not visualized. No aortic stenosis is present. Aortic valve peak gradient measures 5.5 mmHg. Pulmonic Valve: The pulmonic valve was normal in structure. Pulmonic valve regurgitation is not visualized. No evidence of pulmonic stenosis. Aorta: The aortic root is normal in size and structure. Venous: The inferior vena cava is normal in size with greater than 50% respiratory variability, suggesting right atrial pressure of 3 mmHg. IAS/Shunts: No atrial level shunt detected by color flow Doppler.  LEFT VENTRICLE PLAX 2D LVIDd:         4.73 cm   Diastology LVIDs:         3.55 cm   LV e' medial:    4.03 cm/s LV PW:         0.94 cm   LV E/e' medial:  14.7 LV IVS:        1.26 cm   LV e' lateral:   5.66 cm/s LVOT diam:     1.80 cm   LV E/e' lateral: 10.4 LVOT Area:     2.54 cm  RIGHT VENTRICLE RV Mid diam:    2.42 cm RV S prime:     16.00 cm/s TAPSE (M-mode): 2.4 cm LEFT ATRIUM             Index        RIGHT ATRIUM           Index LA diam:        3.60 cm 1.92 cm/m   RA Area:     13.20 cm LA Vol (A2C):   44.7 ml 23.79 ml/m  RA Volume:   27.20 ml  14.48 ml/m LA Vol (A4C):   35.5 ml 18.89 ml/m LA Biplane Vol: 40.3 ml  21.45 ml/m  AORTIC VALVE                 PULMONIC VALVE AV Area (Vmax): 2.04 cm     PV Vmax:        0.77 m/s AV Vmax:        117.00 cm/s  PV Peak grad:   2.4 mmHg AV Peak Grad:   5.5 mmHg     RVOT Peak grad: 2 mmHg LVOT Vmax:      93.80 cm/s  AORTA Ao Root diam: 2.70 cm MITRAL VALVE MV Area (PHT): 3.34 cm     SHUNTS MV Decel Time: 227 msec     Systemic Diam: 1.80 cm MV E velocity: 59.10 cm/s MV A velocity: 114.00 cm/s MV E/A ratio:  0.52 MV A Prime:    10.4 cm/s Isaias Cowman MD Electronically signed by Isaias Cowman MD Signature Date/Time: 05/27/2021/10:12:01 AM    Final    CT CHEST ABDOMEN PELVIS WO CONTRAST  Result Date: 05/21/2021 CLINICAL DATA:  Found unresponsive.  VP shunt in place. EXAM: CT HEAD WITHOUT CONTRAST CT CERVICAL SPINE WITHOUT CONTRAST CT CHEST, ABDOMEN AND PELVIS WITHOUT CONTRAST TECHNIQUE: Contiguous axial images were obtained from the base of the skull through the vertex without intravenous contrast. Multidetector CT imaging of the cervical spine was performed without intravenous contrast. Multiplanar CT image reconstructions were also generated. Multidetector CT imaging of the chest, abdomen  and pelvis was performed following the standard protocol without IV contrast. COMPARISON:  Pet CT 01/16/2011, chest x-ray 11/05/2012, CT miscellaneous 10/03/2011 FINDINGS: CT HEAD FINDINGS BRAIN: BRAIN Left posterior approach ventriculoperitoneal shunt with tip terminating midline along the anterior left lateral ventricle. Cerebral volume loss as well as interval development of mild hydrocephalus with surrounding periventricular edema. Patchy and confluent areas of decreased attenuation are noted throughout the deep and periventricular white matter of the cerebral hemispheres bilaterally, compatible with chronic microvascular ischemic disease. No evidence of large-territorial acute infarction. No parenchymal hemorrhage. No mass lesion. No extra-axial collection. No mass effect or  midline shift. No hydrocephalus. Basilar cisterns are patent. Vascular: No hyperdense vessel. Atherosclerotic calcifications are present within the cavernous internal carotid arteries. Skull: No acute fracture or focal lesion. Sinuses/Orbits: Paranasal sinuses and mastoid air cells are clear. The orbits are unremarkable. Other: None. CT CERVICAL FINDINGS Alignment: Normal. Skull base and vertebrae: No acute fracture. No aggressive appearing focal osseous lesion or focal pathologic process. Soft tissues and spinal canal: No prevertebral fluid or swelling. No visible canal hematoma. Upper chest: Unremarkable. Other: Left ventricular peritoneal shunt is noted to coil within the soft tissues of the neck. CT CHEST: Ports and Devices: Right chest wall Port-A-Cath with tip at the level of the superior cavoatrial junction. Left VA shunt terminates in the anterior mediastinum and no longer within the left brachiocephalic vein as noted on CT miscellaneous 10/03/2011. Lungs/airways: Ground-glass airspace opacity within the right lower lobe and patchy airspace opacity within left lower lobe. No focal consolidation. No pulmonary nodule. No pulmonary mass. No pulmonary contusion or laceration. No pneumatocele formation. The central airways are patent. Pleura: No pleural effusion. No pneumothorax. No hemothorax. Lymph Nodes: Limited evaluation for hilar lymphadenopathy on this noncontrast study. No mediastinal or axillary lymphadenopathy. Right axillary surgical clips. Mediastinum: No pneumomediastinum. No fat stranding within the mediastinum. No hematoma formation. The thoracic aorta is normal in caliber. The heart is normal in size. No significant pericardial effusion. The esophagus is unremarkable. Interval development of an enlarged right thyroid gland with limited evaluation due to streak artifact. Chest Wall / Breasts: No chest wall mass. Musculoskeletal: No acute rib or sternal fracture. No spinal fracture. CT ABDOMEN /  PELVIS: Liver: Not enlarged. No focal lesion. Biliary System: Limited evaluation due to motion artifact. The gallbladder is otherwise unremarkable with no radio-opaque gallstones. No biliary ductal dilatation. Pancreas: Normal pancreatic contour. No main pancreatic duct dilatation. Spleen: Not enlarged. No focal lesion. Adrenal Glands: No nodularity bilaterally. Kidneys: No hydroureteronephrosis. No nephroureterolithiasis. No contour deforming renal mass. Foley catheter terminates within the urinary bladder lumen. The urinary bladder is decompressed. Bowel: No small or large bowel wall thickening or dilatation. The appendix is unremarkable. Mesentery, Omentum, and Peritoneum: No simple free fluid ascites. No pneumoperitoneum. No mesenteric hematoma identified. No organized fluid collection. Pelvic Organs: The uterus and bilateral adnexal regions are unremarkable. Lymph Nodes: No abdominal, pelvic, inguinal lymphadenopathy. Vasculature: No abdominal aorta or iliac aneurysm. Musculoskeletal: No significant soft tissue hematoma. No acute pelvic fracture. No spinal fracture. IMPRESSION: 1. Interval development of mild hydrocephalus and periventricular edema. 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 The shunt is noted to be coiled within the neck. Recommend neurosurgery consultation. 2. No acute displaced fracture or traumatic listhesis of the cervical spine. 3. Ground-glass airspace opacity within the right lower lobe and patchy airspace opacity within left lower lobe.  Findings may represent a combination of infection/inflammation as well as atelectasis. 4. No acute traumatic injury to the chest, abdomen, or pelvis with limited evaluation on this noncontrast study. 5. No acute fracture or traumatic malalignment of the thoracic or lumbar spine. 6.  Aortic Atherosclerosis (ICD10-I70.0). 7.  Interval development of an enlarged right thyroid gland with limited evaluation due to streak artifact. Recommend thyroid ultrasound (ref: J Am Coll Radiol. 2015 Feb;12(2): 143-50). These results were called by telephone at the time of interpretation on 05/21/2021 at 9:48 pm to provider [facility] , who verbally acknowledged these results. Electronically Signed   By: Iven Finn M.D.   On: 05/21/2021 21:54     Assessment and Recommendation  65 y.o. 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  Continue supportive care for sepsis mental status changes and uremia currently at low risk for endocarditis as primary cause 2.  Cannot assess atrial appendages for thrombus but would not likely be able to anticoagulation 3.  No further cardiac diagnostics necessary at this time 4.  Call if further questions  Signed, Serafina Royals M.D. FACC

## 2021-05-29 NOTE — Progress Notes (Signed)
Attending Progress Note  History: Desiree Mitchell presenting on 05/21/21 with after being found unresponsive by family. She has a history of prior subarachnoid hemorrhage requiring shunt placement. Resulting in neurosurgery consult. CT head showed increased ventricular size concerning for shunt malfunction.  She underwent removal of shunt and placement of EVD on 05/21/21.   POD#8 Patient went for echo today given concern for emboli. EVD remained clamp and patient appears to be at baseline neurologically  POD#7 CT stable after EVD clamped for 24 hours. Sleepy but arousable.   POD#5: Stable CSF output, Patient improved alertness without headache. Increasing level of EVD to 25.  POD#4: MRI showing some watershed type infarcts  POD#3: NAEO  POD#2: patient extubated yesterday but increased O2 requirement overnight   POD#1: Currently off all sedation  Physical Exam: Vitals:   05/29/21 1300 05/29/21 1316  BP: (!) 151/94   Pulse: 94   Resp: 17   Temp:    SpO2: 95% 93%    PERRL Opens eyes to voice. Following some simple one step commands. Would not state name today Wiggles toes, thumbs up on command, right more spontaneous than left  CSF remains clear.  EVD clamped, bedside ICP 15 mm water  Data:  Recent Labs  Lab 05/27/21 0541 05/27/21 2117 05/28/21 0458 05/29/21 0449  NA 138   < > 136 136  K 4.4  --  4.0 4.4  CL 104  --  100 104  CO2 27  --  28 29  BUN 23  --  21 18  CREATININE 0.88  --  0.89 0.84  GLUCOSE 113*  --  148* 107*  CALCIUM 7.5*  --  7.7* 7.5*   < > = values in this interval not displayed.    Recent Labs  Lab 05/25/21 0517  AST 47*  ALT 41  ALKPHOS 58      Recent Labs  Lab 05/27/21 0541 05/28/21 0458 05/29/21 0449  WBC 12.9* 12.8* 14.0*  HGB 11.6* 11.6* 11.3*  HCT 35.2* 36.5 34.0*  PLT 314 317 322    No results for input(s): APTT, INR in the last 168 hours.       Other tests/results:  CT head  IMPRESSION: 1. Interval development of  mild hydrocephalus and periventricular edema. 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 The shunt is noted to be coiled within the neck. Recommend neurosurgery consultation. 2. No acute displaced fracture or traumatic listhesis of the cervical spine. 3. Ground-glass airspace opacity within the right lower lobe and patchy airspace opacity within left lower lobe. Findings may represent a combination of infection/inflammation as well as atelectasis. 4. No acute traumatic injury to the chest, abdomen, or pelvis with limited evaluation on this noncontrast study.   5. No acute fracture or traumatic malalignment of the thoracic or lumbar spine. 6.  Aortic Atherosclerosis (ICD10-I70.0). 7. Interval development of an enlarged right thyroid gland with limited evaluation due to streak artifact. Recommend thyroid ultrasound (ref: J Am Coll Radiol. 2015 Feb;12(2): 143-50).   These results were called by telephone at the time of interpretation on 05/21/2021 at 9:48 pm to provider [facility] , who verbally acknowledged these results.   Electronically Signed   By: Iven Finn M.D.   On: 05/21/2021 21:54   Narrative & Impression  CLINICAL DATA:  Neuro deficit, acute, stroke suspected   EXAM: MRI HEAD WITHOUT CONTRAST   TECHNIQUE:  Multiplanar, multiecho pulse sequences of the brain and surrounding structures were obtained without intravenous contrast.   COMPARISON:  Correlation made with prior CTs   FINDINGS: Brain: There are a few scattered small foci of reduced diffusion including involvement of the right basal ganglia and corona radiata, left basal ganglia, right inferior parietal cortex, parasagittal left frontal cortex, and anterior right cerebellum.   Left posterior approach ventricular catheter extends to the right of midline traversing the  third ventricle with tip extending laterally between frontal horn and third ventricle. Prior right frontal catheter tract. Ventricles are similar in size to recent CT imaging.   Right anterior temporal encephalomalacia. 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 Chronic infarcts and/or perivascular spaces along the central cerebral white matter and deep gray nuclei. Patchy and confluent areas of T2 hyperintensity in the supratentorial white matter may reflect chronic microvascular ischemic changes. Scattered foci of susceptibility likely reflecting chronic microhemorrhages due to hypertension.   No intracranial mass or mass effect.   Vascular: Major vessel flow voids at the sk few scattered small acute ull base are preserved.   Skull and upper cervical spine: Normal marrow signal is preserved.   Sinuses/Orbits: Paranasal sinuses are aerated. Orbits are unremarkable.   Other: Sella is unremarkable.  Mastoid air cells are clear.   IMPRESSION: Few scattered small acute infarcts involving different vascular territories.   Ventricular catheter present with similar caliber of ventricles as compared to recent CT.   Chronic microvascular ischemic changes, chronic infarcts, and chronic microhemorrhages.     Electronically Signed   By: Macy Mis M.D.   On: 05/25/2021 15:31    Assessment/Plan:  Desiree Mitchell is a 65 y.o presenting to the ER after being found down by her family on 05/21/21. She was found to be septic and head CT was concerning for shunt malfunction.  She underwent removal of VA shunt on 05/21/2021 and placement of EVD.  - EVD in place. Clamped today, will remove tomorrow if stable - PLEASE HOLD LOVENOX - continue q2 neuro checks - CSF cultures NTD - MRI demonstrated multifocal infarcts, please consult neurology for further management.  - remainder of management per recommendations of critical care.  Deetta Perla Department of  Neurosurgery

## 2021-05-29 NOTE — Progress Notes (Signed)
[facility] INFECTIOUS DISEASE PROGRESS NOTE Date of Admission:  05/21/2021     ID: Desiree Mitchell is a 65 y.o. female with  bacteremia Principal Problem:   Sepsis ([place]) Active Problems:   Endotracheally intubated   On mechanically assisted ventilation (HCC)   Shunt malfunction   Rhabdomyolysis   AKI (acute kidney injury) ([place])   Increased anion gap metabolic acidosis   Acute encephalopathy   Transaminitis   Pressure injury of skin   Acute embolic stroke (HCC)   Subjective: TEE today could not be fully done as could not pass probe.   ROS unable to obtain  Medications:  Antibiotics Given (last 72 hours)     Date/Time Action Medication Dose Rate   05/26/21 2250 New Bag/Given   linezolid (ZYVOX) IVPB 600 mg 600 mg 300 mL/hr   05/27/21 1034 New Bag/Given   cefTRIAXone (ROCEPHIN) 2 g in sodium chloride 0.9 % 100 mL IVPB 2 g 200 mL/hr   05/27/21 1203 New Bag/Given   linezolid (ZYVOX) IVPB 600 mg 600 mg 300 mL/hr   05/27/21 2109 New Bag/Given   linezolid (ZYVOX) IVPB 600 mg 600 mg 300 mL/hr   05/28/21 7681 New Bag/Given   cefTRIAXone (ROCEPHIN) 2 g in sodium chloride 0.9 % 100 mL IVPB 2 g 200 mL/hr   05/28/21 1039 New Bag/Given   linezolid (ZYVOX) IVPB 600 mg 600 mg 300 mL/hr   05/28/21 2140 New Bag/Given   linezolid (ZYVOX) IVPB 600 mg 600 mg 300 mL/hr   05/29/21 1572 New Bag/Given   cefTRIAXone (ROCEPHIN) 2 g in sodium chloride 0.9 % 100 mL IVPB 2 g 200 mL/hr       budesonide (PULMICORT) nebulizer solution  0.5 mg Nebulization BID   chlorhexidine gluconate (MEDLINE KIT)  15 mL Mouth Rinse BID   Chlorhexidine Gluconate Cloth  6 each Topical Daily   collagenase   Topical Daily   cyanocobalamin  1,000 mcg Intramuscular Q0600   enoxaparin (LOVENOX) injection  40 mg Subcutaneous Q24H   free water  30 mL Per Tube Q4H   Gerhardt's butt cream   Topical QID   ipratropium-albuterol  3 mL Nebulization Q6H   lidocaine       pantoprazole (PROTONIX) IV  40 mg Intravenous QHS    sodium chloride flush  10-40 mL Intracatheter Q12H   thiamine injection  100 mg Intravenous Daily    Objective: Vital signs in last 24 hours: Temp:  [97.4 F (36.3 C)-98.6 F (37 C)] 97.4 F (36.3 C) (10/18 1200) Pulse Rate:  [88-114] 94 (10/18 1300) Resp:  [13-30] 17 (10/18 1300) BP: (82-151)/(63-124) 151/94 (10/18 1300) SpO2:  [89 %-100 %] 93 % (10/18 1316) Weight:  [86.4 kg] 86.4 kg (10/18 0500) Constitutional:  ill appearing, slowed mentation but eyes open and able to talk slowly and answer simple questions HENT:  no scleral icterus Mouth/Throat: Oropharynx is dry Scalp with EVD in place.  Neck with large area of skin breakdown with rednes.  Cardiovascular: Normal rate, regular rhythm and normal heart sounds. .  Portacath in R chest wall is accessed- no redness Pulmonary/Chest: rhonchi bil   Neck = supple, no nuchal rigidity Abdominal: Soft. Bowel sounds are normal.  exhibits no distension. There is no tenderness.  Lymphadenopathy: no cervical adenopathy. No axillary adenopathy Neurological: more alert today today but slowed mentatio Skin: see photos, multiple ares of skin bruising and skin breakdown Psychiatric: min responsive  Lab Results Recent Labs    05/28/21 0458 05/29/21 0449  WBC 12.8* 14.0*  HGB 11.6* 11.3*  HCT 36.5 34.0*  NA 136 136  K 4.0 4.4  CL 100 104  CO2 28 29  BUN 21 18  CREATININE 0.89 0.84    Microbiology: Results for orders placed or performed during the hospital encounter of 05/21/21  Blood culture (routine x 2)     Status: Abnormal   Collection Time: 05/21/21  7:05 PM   Specimen: BLOOD  Result Value Ref Range Status   Specimen Description   Final    BLOOD LEFT ANTECUBITAL Performed at [facility], [address] [place], [place] [address]    Special Requests   Final    BOTTLES DRAWN AEROBIC AND ANAEROBIC Blood Culture adequate volume Performed at [facility], [address] [place], [place] [address]     Culture  Setup Time   Final    IN BOTH AEROBIC AND ANAEROBIC BOTTLES GRAM POSITIVE COCCI GRAM NEGATIVE RODS Organism ID to follow CRITICAL RESULT CALLED TO, READ BACK BY AND VERIFIED WITH: CARISSA [facility] AT 2054 05/22/21 BY [facility] Performed at [facility], [address] [place], [place] [address]    Culture PROTEUS PENNERI STAPHYLOCOCCUS EPIDERMIDIS  (A)  Final   Report Status 05/27/2021 FINAL  Final   Organism ID, Bacteria PROTEUS PENNERI  Final   Organism ID, Bacteria STAPHYLOCOCCUS EPIDERMIDIS  Final      Susceptibility   Proteus penneri - MIC*    AMPICILLIN >=32 RESISTANT Resistant     CEFAZOLIN >=64 RESISTANT Resistant     CEFEPIME <=0.12 SENSITIVE Sensitive     CEFTAZIDIME <=1 SENSITIVE Sensitive     CEFTRIAXONE <=0.25 SENSITIVE Sensitive     CIPROFLOXACIN <=0.25 SENSITIVE Sensitive     GENTAMICIN <=1 SENSITIVE Sensitive     IMIPENEM 2 SENSITIVE Sensitive     TRIMETH/SULFA <=20 SENSITIVE Sensitive     AMPICILLIN/SULBACTAM 16 INTERMEDIATE Intermediate     PIP/TAZO <=4 SENSITIVE Sensitive     * PROTEUS PENNERI   Staphylococcus epidermidis - MIC*    CIPROFLOXACIN <=0.5 SENSITIVE Sensitive     ERYTHROMYCIN <=0.25 SENSITIVE Sensitive     GENTAMICIN <=0.5 SENSITIVE Sensitive     OXACILLIN <=0.25 SENSITIVE Sensitive     TETRACYCLINE 2 SENSITIVE Sensitive     VANCOMYCIN 2 SENSITIVE Sensitive     TRIMETH/SULFA <=10 SENSITIVE Sensitive     CLINDAMYCIN <=0.25 SENSITIVE Sensitive     RIFAMPIN <=0.5 SENSITIVE Sensitive     Inducible Clindamycin NEGATIVE Sensitive     * STAPHYLOCOCCUS EPIDERMIDIS  Blood Culture ID Panel (Reflexed)     Status: Abnormal   Collection Time: 05/21/21  7:05 PM  Result Value Ref Range Status   Enterococcus faecalis NOT DETECTED NOT DETECTED Final   Enterococcus Faecium NOT DETECTED NOT DETECTED Final   Listeria monocytogenes NOT DETECTED NOT DETECTED Final   Staphylococcus species DETECTED (A) NOT DETECTED Final    Comment: READ BACK AND  VERIFIED BY CARISSA DOLAN AT 2054 05/22/21 BY [facility]   Staphylococcus aureus (BCID) NOT DETECTED NOT DETECTED Final   Staphylococcus epidermidis DETECTED (A) NOT DETECTED Final    Comment: READ BACK AND VERIFIED BY CARRISA DOLAN, AT 2045 05/22/21 BY [facility]   Staphylococcus lugdunensis NOT DETECTED NOT DETECTED Final   Streptococcus species NOT DETECTED NOT DETECTED Final   Streptococcus agalactiae NOT DETECTED NOT DETECTED Final   Streptococcus pneumoniae NOT DETECTED NOT DETECTED Final   Streptococcus pyogenes NOT DETECTED NOT DETECTED Final   A.calcoaceticus-baumannii NOT DETECTED NOT DETECTED Final   Bacteroides fragilis  NOT DETECTED NOT DETECTED Final   Enterobacterales NOT DETECTED NOT DETECTED Final   Enterobacter cloacae complex NOT DETECTED NOT DETECTED Final   Escherichia coli NOT DETECTED NOT DETECTED Final   Klebsiella aerogenes NOT DETECTED NOT DETECTED Final   Klebsiella oxytoca NOT DETECTED NOT DETECTED Final   Klebsiella pneumoniae NOT DETECTED NOT DETECTED Final   Proteus species NOT DETECTED NOT DETECTED Final   Salmonella species NOT DETECTED NOT DETECTED Final   Serratia marcescens NOT DETECTED NOT DETECTED Final   Haemophilus influenzae NOT DETECTED NOT DETECTED Final   Neisseria meningitidis NOT DETECTED NOT DETECTED Final   Pseudomonas aeruginosa NOT DETECTED NOT DETECTED Final   Stenotrophomonas maltophilia NOT DETECTED NOT DETECTED Final   Candida albicans NOT DETECTED NOT DETECTED Final   Candida auris NOT DETECTED NOT DETECTED Final   Candida glabrata NOT DETECTED NOT DETECTED Final   Candida krusei NOT DETECTED NOT DETECTED Final   Candida parapsilosis NOT DETECTED NOT DETECTED Final   Candida tropicalis NOT DETECTED NOT DETECTED Final   Cryptococcus neoformans/gattii NOT DETECTED NOT DETECTED Final   Methicillin resistance mecA/C NOT DETECTED NOT DETECTED Final    Comment: Performed at [facility], [address] [place], [place] [address]  Blood  Culture ID Panel (Reflexed)     Status: Abnormal   Collection Time: 05/21/21  7:05 PM  Result Value Ref Range Status   Enterococcus faecalis NOT DETECTED NOT DETECTED Final   Enterococcus Faecium NOT DETECTED NOT DETECTED Final   Listeria monocytogenes NOT DETECTED NOT DETECTED Final   Staphylococcus species DETECTED (A) NOT DETECTED Final    Comment: CRITICAL RESULT CALLED TO, READ BACK BY AND VERIFIED WITH: JASON ROBBINS PHARMD 0522 05/23/21 HNM    Staphylococcus aureus (BCID) NOT DETECTED NOT DETECTED Final   Staphylococcus epidermidis NOT DETECTED NOT DETECTED Final   Staphylococcus lugdunensis NOT DETECTED NOT DETECTED Final   Streptococcus species NOT DETECTED NOT DETECTED Final   Streptococcus agalactiae NOT DETECTED NOT DETECTED Final   Streptococcus pneumoniae NOT DETECTED NOT DETECTED Final   Streptococcus pyogenes NOT DETECTED NOT DETECTED Final   A.calcoaceticus-baumannii NOT DETECTED NOT DETECTED Final   Bacteroides fragilis NOT DETECTED NOT DETECTED Final   Enterobacterales DETECTED (A) NOT DETECTED Final    Comment: Enterobacterales represent a large order of gram negative bacteria, not a single organism. CRITICAL RESULT CALLED TO, READ BACK BY AND VERIFIED WITH: JASON ROBBINS PHARMD 0522 05/23/21 HNM    Enterobacter cloacae complex NOT DETECTED NOT DETECTED Final   Escherichia coli NOT DETECTED NOT DETECTED Final   Klebsiella aerogenes NOT DETECTED NOT DETECTED Final   Klebsiella oxytoca NOT DETECTED NOT DETECTED Final   Klebsiella pneumoniae NOT DETECTED NOT DETECTED Final   Proteus species DETECTED (A) NOT DETECTED Final    Comment: CRITICAL RESULT CALLED TO, READ BACK BY AND VERIFIED WITH: JASON ROBBINS PHARMD 0522 05/23/21 HNM    Salmonella species NOT DETECTED NOT DETECTED Final   Serratia marcescens NOT DETECTED NOT DETECTED Final   Haemophilus influenzae NOT DETECTED NOT DETECTED Final   Neisseria meningitidis NOT DETECTED NOT DETECTED Final   Pseudomonas  aeruginosa NOT DETECTED NOT DETECTED Final   Stenotrophomonas maltophilia NOT DETECTED NOT DETECTED Final   Candida albicans NOT DETECTED NOT DETECTED Final   Candida auris NOT DETECTED NOT DETECTED Final   Candida glabrata NOT DETECTED NOT DETECTED Final   Candida krusei NOT DETECTED NOT DETECTED Final   Candida parapsilosis NOT DETECTED NOT DETECTED Final   Candida tropicalis NOT DETECTED  NOT DETECTED Final   Cryptococcus neoformans/gattii NOT DETECTED NOT DETECTED Final   CTX-M ESBL NOT DETECTED NOT DETECTED Final   Carbapenem resistance IMP NOT DETECTED NOT DETECTED Final   Carbapenem resistance KPC NOT DETECTED NOT DETECTED Final   Carbapenem resistance NDM NOT DETECTED NOT DETECTED Final   Carbapenem resist OXA 48 LIKE NOT DETECTED NOT DETECTED Final   Carbapenem resistance VIM NOT DETECTED NOT DETECTED Final    Comment: Performed at [facility], [address] [place], [place] [address]  Resp Panel by RT-PCR (Flu A&B, Covid)     Status: None   Collection Time: 05/21/21  9:46 PM   Specimen: Nasopharyngeal(NP) swabs in vial transport medium  Result Value Ref Range Status   SARS Coronavirus 2 by RT PCR NEGATIVE NEGATIVE Final    Comment: (NOTE) SARS-CoV-2 target nucleic acids are NOT DETECTED.  The SARS-CoV-2 RNA is generally detectable in upper respiratory specimens during the acute phase of infection. The lowest concentration of SARS-CoV-2 viral copies this assay can detect is 138 copies/mL. A negative result does not preclude SARS-Cov-2 infection and should not be used as the sole basis for treatment or other patient management decisions. A negative result may occur with  improper specimen collection/handling, submission of specimen other than nasopharyngeal swab, presence of viral mutation(s) within the areas targeted by this assay, and inadequate number of viral copies(<138 copies/mL). A negative result must be combined with clinical observations, patient  history, and epidemiological information. The expected result is Negative.  Fact Sheet for Patients:  EntrepreneurPulse.com.au  Fact Sheet for Healthcare Providers:  IncredibleEmployment.be  This test is no t yet approved or cleared by the [place] FDA and  has been authorized for detection and/or diagnosis of SARS-CoV-2 by FDA under an Emergency Use Authorization (EUA). This EUA will remain  in effect (meaning this test can be used) for the duration of the COVID-19 declaration under Section 564(b)(1) of the Act, 21 U.S.C.section 360bbb-3(b)(1), unless the authorization is terminated  or revoked sooner.       Influenza A by PCR NEGATIVE NEGATIVE Final   Influenza B by PCR NEGATIVE NEGATIVE Final    Comment: (NOTE) The Xpert Xpress SARS-CoV-2/FLU/RSV plus assay is intended as an aid in the diagnosis of influenza from Nasopharyngeal swab specimens and should not be used as a sole basis for treatment. Nasal washings and aspirates are unacceptable for Xpert Xpress SARS-CoV-2/FLU/RSV testing.  Fact Sheet for Patients: EntrepreneurPulse.com.au  Fact Sheet for Healthcare Providers: IncredibleEmployment.be  This test is not yet approved or cleared by the [place] FDA and has been authorized for detection and/or diagnosis of SARS-CoV-2 by FDA under an Emergency Use Authorization (EUA). This EUA will remain in effect (meaning this test can be used) for the duration of the COVID-19 declaration under Section 564(b)(1) of the Act, 21 U.S.C. section 360bbb-3(b)(1), unless the authorization is terminated or revoked.  Performed at [facility], [place], [place] [address]   Aerobic/Anaerobic Culture w Gram Stain (surgical/deep wound)     Status: None   Collection Time: 05/22/21  1:24 AM   Specimen: CSF; Body Fluid  Result Value Ref Range Status   Specimen Description   Final     CSF Performed at [facility], [address], [place], [place] [address]    Special Requests   Final    NONE Performed at [facility], [place], [place] [address]    Gram Stain NO WBC SEEN  NO ORGANISMS SEEN   Final   Culture   Final    No growth aerobically or anaerobically. Performed at [facility], [place] [address] [place], [place] [address]    Report Status 05/27/2021 FINAL  Final  Culture, blood (Routine X 2) w Reflex to ID Panel     Status: None   Collection Time: 05/22/21  3:02 AM   Specimen: BLOOD  Result Value Ref Range Status   Specimen Description BLOOD RIGHT HAND  Final   Special Requests   Final    BOTTLES DRAWN AEROBIC ONLY Blood Culture results may not be optimal due to an inadequate volume of blood received in culture bottles   Culture   Final    NO GROWTH 5 DAYS Performed at [facility], [address], [place], [place] [address]    Report Status 05/27/2021 FINAL  Final    Studies/Results: CT HEAD WO CONTRAST (5MM)  Result Date: 05/28/2021 CLINICAL DATA:  Hydrocephalus.  Found unresponsive. EXAM: CT HEAD WITHOUT CONTRAST TECHNIQUE: Contiguous axial images were obtained from the base of the skull through the vertex without intravenous contrast. COMPARISON:  Head CT 05/24/2021.  MRI 05/25/2021. FINDINGS: Brain: No appreciable change since the CT of 4 days ago. VP shunt enters from a left parietal approach position of the catheter is unchanged and ventricular size is unchanged. Catheter appears to communicate with both the occipital horn of the left lateral ventricle and the third ventricle. Extensive chronic low-density seen throughout the cerebral hemispheric white matter. 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 Subacute infarction in the right basal ganglia appears similar. No sign of new infarction, mass lesion, hemorrhage or extra-axial collection.  Vascular: There is atherosclerotic calcification of the major vessels at the base of the brain. Skull: Negative except for previous burr holes. Sinuses/Orbits: Clear/normal Other: None IMPRESSION: Stable CT study. VP shunt in place with stable ventricular size. Extensive chronic ischemic changes as described above. Subacute infarction in the right basal ganglia/radiating white matter tracts as seen previously. Electronically Signed   By: Nelson Chimes M.D.   On: 05/28/2021 12:48   DG Abd Portable 1V  Result Date: 05/28/2021 CLINICAL DATA:  NG tube placement EXAM: PORTABLE ABDOMEN - 1 VIEW COMPARISON:  05/26/2021 FINDINGS: Limited radiograph of the lower chest and upper abdomen was obtained for the purposes of enteric tube localization. Enteric tube is seen coursing below the diaphragm with distal tip and side port terminating within the expected location of the gastric body. IMPRESSION: Enteric tube tip and side port are seen within the gastric body. Electronically Signed   By: Davina Poke D.O.   On: 05/28/2021 10:53    Assessment/Plan: Lundyn Coste is a 65 y.o. 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 She had extensive skin bruising and also a wound on post neck where her head had rested against a basket. Also has multiple skin lesions and was apparently infested with bedbugs and cockroaches on admission.  She was found to have malfunctioning of her VA shunt with the distal end coiled in the neck.  She underwent removal of her VA shunt as well as placement of external ventricular drain.  She was found to have bacteremia with 1 set of cultures growing Proteus as well as staph species and the set of blood cultures other growing staph epidermidis.  She does have a Port-A-Cath in  place.   I suspect her bacteremia is from her skin on her legs as well as newer lesions on her being down especially infected appearing wound on the  posterior neck.  No evidence that her VP shunt was infected apparently.  Cultures are negative.  She has had CT of her chest abdomen and pelvis.  Only finding was some mild atelectasis. She has been on cefepime and has received 1 dose of Vanco Follow-up blood cultures 10/11 no growth to date COVID.   10/14- no fevers,  October 17-no fevers.  White count down to 12.  Remains in the unit.  CSF cultures negative.  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  She has been seen by neurology.  TEE is planned.  Oct 18- remains stable TEE could not be done. Dr Nehemiah Massed reviewed echo and reports good images and low risk of endocarditis  I think her risk of endocarditis is low. Staph epi and proteus are unlikely causes of endocarditis.  The staph epi would be more likely to cause infection of the portacath but that would not lead to risk of embolic CVA. She has cleared her cultures. CSF cultures negative as well.    Recommendations Cont linezolid x 14 days planned course for GP coverage given skin and soft tissue infection.  Cont ceftriaxone for proteus for 10 days.  Continue wound care. After abx course is finished I would suggest monitoring for any new signs of infection and if needed repeat bcx to eval for recurrent bacteremia possibly related to her portacath.   Thank you very much for the consult. Will follow with you.  Leonel Ramsay   05/29/2021, 3:17 PM

## 2021-05-29 NOTE — TOC Progression Note (Addendum)
Transition of Care [facility]) - Progression Note    Patient Details  Name: Desiree Mitchell MRN: [id number] Date of Birth: 1955/10/22  Transition of Care [facility]) CM/SW [place], [place] Phone Number: [phone] 05/29/2021, 8:19 AM  Clinical Narrative:     Patient remains critically ill.  Patient is still experiencing difficulty w/ orientation, but is able to respond some questions correctly.  Patient mentioned to ICU RN she is unable to care for herself at home.  CSW sent referral to [place], for Medicaid review.  Patient only has Medicare.    Update:  CSW contacted Amy Shepherd at [employer], who stated the patient is currently receiving SSDI and also has Medicaid [id number], which is Medicaid that only covers part of the patient's Medicare part B expenditures, this indicates the patient's monthly income exceeds Medicaid criteria and she will most likely meet criteria for regular medicaid. Amy suggested patient's daughter contact DSS for assistance/more information.   Expected Discharge Plan: Long Term Acute Care (LTAC) Barriers to Discharge: Continued Medical Work up  Expected Discharge Plan and Services Expected Discharge Plan: Long Term Acute Care (LTAC) In-house Referral: Clinical Social Work   Post Acute Care Choice: Long Term Acute Care (LTAC) Living arrangements for the past 2 months: Single Family Home                                       Social Determinants of Health (SDOH) Interventions    Readmission Risk Interventions No flowsheet data found.

## 2021-05-29 NOTE — Sedation Documentation (Signed)
ICU Myra accompanies patient back to ICU post procedure. Patient at baseline upon departure

## 2021-05-29 NOTE — CV Procedure (Signed)
Transesophageal echocardiogram preliminary report  Desiree Mitchell [id number] Mar 29, 1956  Preliminary diagnosis  Bacteremia with possible endocarditis  Postprocedural diagnosis Significant abnormal epiglottis region anatomy too narrow for pass of larger probe and no smaller probe available  therefore, unable to obtain adequate images  Time out A timeout was performed by the nursing staff and physicians specifically identifying the procedure performed, identification of the patient, the type of sedation, all allergies and medications, all pertinent medical history, and presedation assessment of nasopharynx. The patient and or family understand the risks of the procedure including the rare risks of death, stroke, heart attack, esophogeal perforation, sore throat, and reaction to medications given.  Moderate sedation During this procedure the patient has received Versed 0 milligrams and fentanyl 25 micrograms to achieve appropriate moderate sedation.  The patient had continued monitoring of heart rate, oxygenation, blood pressure, respiratory rate, and extent of signs of sedation throughout the entire procedure.  The patient received this moderate sedation over a period of 28 minutes.  Both the nursing staff and I were present during the procedure when the patient had moderate sedation for 100% of the time.  Treatment considerations Review  of surface echo with good  images suggest low risk of endocarditis with bactaremia. Cannot assess atrial appendage.  Adequate images unable to be obtained due to significant narrow and abnormal epiglottis anatomy with inablity to pass larger probe with no smaller probe available  For further details of transesophageal echocardiogram please refer to final report.  Signed,  Corey Skains M.D. [facility] 05/29/2021 9:00 AM

## 2021-05-30 ENCOUNTER — Inpatient Hospital Stay: Payer: Medicare (Managed Care)

## 2021-05-30 DIAGNOSIS — N179 Acute kidney failure, unspecified: Secondary | ICD-10-CM | POA: Diagnosis not present

## 2021-05-30 DIAGNOSIS — A419 Sepsis, unspecified organism: Secondary | ICD-10-CM | POA: Diagnosis not present

## 2021-05-30 DIAGNOSIS — L8990 Pressure ulcer of unspecified site, unspecified stage: Secondary | ICD-10-CM | POA: Diagnosis not present

## 2021-05-30 DIAGNOSIS — I639 Cerebral infarction, unspecified: Secondary | ICD-10-CM | POA: Diagnosis not present

## 2021-05-30 DIAGNOSIS — J9601 Acute respiratory failure with hypoxia: Secondary | ICD-10-CM | POA: Diagnosis not present

## 2021-05-30 DIAGNOSIS — R6520 Severe sepsis without septic shock: Secondary | ICD-10-CM

## 2021-05-30 LAB — GLUCOSE, CAPILLARY
Glucose-Capillary: 127 mg/dL — ABNORMAL HIGH (ref 70–99)
Glucose-Capillary: 125 mg/dL — ABNORMAL HIGH (ref 70–99)
Glucose-Capillary: 116 mg/dL — ABNORMAL HIGH (ref 70–99)
Glucose-Capillary: 92 mg/dL (ref 70–99)

## 2021-05-30 LAB — BASIC METABOLIC PANEL
Anion gap: 7 (ref 5–15)
BUN: 20 mg/dL (ref 8–23)
CO2: 27 mmol/L (ref 22–32)
Calcium: 7.9 mg/dL — ABNORMAL LOW (ref 8.9–10.3)
Chloride: 102 mmol/L (ref 98–111)
Creatinine, Ser: 0.79 mg/dL (ref 0.44–1.00)
GFR, Estimated: >60 mL/min (ref >60)
Glucose, Bld: 138 mg/dL — ABNORMAL HIGH (ref 70–99)
Potassium: 4.3 mmol/L (ref 3.5–5.1)
Sodium: 136 mmol/L (ref 135–145)

## 2021-05-30 LAB — MAGNESIUM: Magnesium: 1.9 mg/dL (ref 1.7–2.4)

## 2021-05-30 MED ORDER — GERHARDT'S BUTT CREAM
TOPICAL_CREAM | Freq: Three times a day (TID) | CUTANEOUS | Status: DC
  Administered 2021-06-02 – 2021-06-06 (×4): 1 via TOPICAL
  Filled 2021-05-30 (×3): qty 1

## 2021-05-30 MED ORDER — LINEZOLID 600 MG PO TABS
600.0000 mg | ORAL_TABLET | Freq: Two times a day (BID) | ORAL | Status: AC
  Administered 2021-05-30 – 2021-06-06 (×15): 600 mg
  Filled 2021-05-30 (×17): qty 1

## 2021-05-30 MED ORDER — LIDOCAINE HCL (PF) 1 % IJ SOLN: 10.0000 mL | Freq: Once | INTRAMUSCULAR | Status: DC

## 2021-05-30 MED ORDER — LIDOCAINE HCL 1 % IJ SOLN
10.0000 mL | Freq: Once | INTRAMUSCULAR | Status: AC
  Administered 2021-05-30: 10 mL via INTRADERMAL

## 2021-05-30 MED ORDER — ALPRAZOLAM 0.5 MG PO TABS: 1.0000 mg | ORAL_TABLET | Freq: Once | ORAL | Status: DC

## 2021-05-30 NOTE — Progress Notes (Signed)
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 Pt went to OR to have shunt removed & EVD was placed. Pt remains in the ICU.  She is on tube feeds. Pharmacy consulted for electrolyte management.  Goal of Therapy:  Electrolytes within normal limits  Plan:  Electrolytes stable Continue to follow along  Tawnya Crook, PharmD, BCPS Clinical Pharmacist 05/30/2021 12:15 PM

## 2021-05-30 NOTE — Progress Notes (Signed)
[place] INFECTIOUS DISEASE PROGRESS NOTE Date of Admission:  05/21/2021     ID: Taaliyah Delpriore is a 65 y.o. female with  bacteremia Principal Problem:   Sepsis ([place]) Active Problems:   Endotracheally intubated   On mechanically assisted ventilation (HCC)   Shunt malfunction   Rhabdomyolysis   AKI (acute kidney injury) ([place])   Increased anion gap metabolic acidosis   Acute encephalopathy   Transaminitis   Pressure injury of skin   Acute embolic stroke (HCC)   Subjective: EVD removed. No fevers. More alert and interactive  ROS unable to obtain  Medications:  Antibiotics Given (last 72 hours)     Date/Time Action Medication Dose Rate   05/27/21 2109 New Bag/Given   linezolid (ZYVOX) IVPB 600 mg 600 mg 300 mL/hr   05/28/21 1096 New Bag/Given   cefTRIAXone (ROCEPHIN) 2 g in sodium chloride 0.9 % 100 mL IVPB 2 g 200 mL/hr   05/28/21 1039 New Bag/Given   linezolid (ZYVOX) IVPB 600 mg 600 mg 300 mL/hr   05/28/21 2140 New Bag/Given   linezolid (ZYVOX) IVPB 600 mg 600 mg 300 mL/hr   05/29/21 0454 New Bag/Given   cefTRIAXone (ROCEPHIN) 2 g in sodium chloride 0.9 % 100 mL IVPB 2 g 200 mL/hr   05/29/21 2141 New Bag/Given   linezolid (ZYVOX) IVPB 600 mg 600 mg 300 mL/hr   05/30/21 1051 New Bag/Given   cefTRIAXone (ROCEPHIN) 2 g in sodium chloride 0.9 % 100 mL IVPB 2 g 200 mL/hr   05/30/21 1139 New Bag/Given   linezolid (ZYVOX) IVPB 600 mg 600 mg 300 mL/hr       budesonide (PULMICORT) nebulizer solution  0.5 mg Nebulization BID   chlorhexidine gluconate (MEDLINE KIT)  15 mL Mouth Rinse BID   Chlorhexidine Gluconate Cloth  6 each Topical Daily   collagenase   Topical Daily   free water  30 mL Per Tube Q4H   Gerhardt's butt cream   Topical QID   ipratropium-albuterol  3 mL Nebulization Q6H   pantoprazole (PROTONIX) IV  40 mg Intravenous QHS   sodium chloride flush  10-40 mL Intracatheter Q12H   thiamine injection  100 mg Intravenous Daily    Objective: Vital signs  in last 24 hours: Temp:  [97.4 F (36.3 C)-99.4 F (37.4 C)] 99.4 F (37.4 C) (10/19 0400) Pulse Rate:  [88-106] 88 (10/19 1400) Resp:  [15-25] 22 (10/19 1400) BP: (128-175)/(73-106) 164/80 (10/19 1400) SpO2:  [86 %-100 %] 98 % (10/19 1400) Weight:  [85.2 kg] 85.2 kg (10/19 0438) Constitutional: chronically ill appearing. Able to tell me she is "a lot better: HENT:  no scleral icterus Mouth/Throat: NGT in place Neck with large area of skin breakdown  Cardiovascular: Normal rate, regular rhythm and normal heart sounds. .  Portacath in R chest wall is accessed- no redness Pulmonary/Chest: rhonchi bil   Neck = supple, no nuchal rigidity Abdominal: Soft. Bowel sounds are normal.  exhibits no distension. There is no tenderness.  Lymphadenopathy: no cervical adenopathy. No axillary adenopathy Neurological: more alert today today but slowed mentation Skin: see photos, multiple ares of skin bruising and skin breakdown Psychiatric: min responsive  Lab Results Recent Labs    05/28/21 0458 05/29/21 0449 05/30/21 0442  WBC 12.8* 14.0*  --   HGB 11.6* 11.3*  --   HCT 36.5 34.0*  --   NA 136 136 136  K 4.0 4.4 4.3  CL 100 104 102  CO2 28 29 27   BUN 21 18  20  CREATININE 0.89 0.84 0.79    Microbiology: Results for orders placed or performed during the hospital encounter of 05/21/21  Blood culture (routine x 2)     Status: Abnormal   Collection Time: 05/21/21  7:05 PM   Specimen: BLOOD  Result Value Ref Range Status   Specimen Description   Final    BLOOD LEFT ANTECUBITAL Performed at [facility], [address], [place], [place] [address]    Special Requests   Final    BOTTLES DRAWN AEROBIC AND ANAEROBIC Blood Culture adequate volume Performed at [facility], [address] [place], [place] [address]    Culture  Setup Time   Final    IN BOTH AEROBIC AND ANAEROBIC BOTTLES GRAM POSITIVE COCCI GRAM NEGATIVE RODS Organism ID to follow CRITICAL RESULT  CALLED TO, READ BACK BY AND VERIFIED WITH: CARISSA [facility] AT 2054 05/22/21 BY [facility] Performed at [facility], [address] [place], [place] [address]    Culture PROTEUS PENNERI STAPHYLOCOCCUS EPIDERMIDIS  (A)  Final   Report Status 05/27/2021 FINAL  Final   Organism ID, Bacteria PROTEUS PENNERI  Final   Organism ID, Bacteria STAPHYLOCOCCUS EPIDERMIDIS  Final      Susceptibility   Proteus penneri - MIC*    AMPICILLIN >=32 RESISTANT Resistant     CEFAZOLIN >=64 RESISTANT Resistant     CEFEPIME <=0.12 SENSITIVE Sensitive     CEFTAZIDIME <=1 SENSITIVE Sensitive     CEFTRIAXONE <=0.25 SENSITIVE Sensitive     CIPROFLOXACIN <=0.25 SENSITIVE Sensitive     GENTAMICIN <=1 SENSITIVE Sensitive     IMIPENEM 2 SENSITIVE Sensitive     TRIMETH/SULFA <=20 SENSITIVE Sensitive     AMPICILLIN/SULBACTAM 16 INTERMEDIATE Intermediate     PIP/TAZO <=4 SENSITIVE Sensitive     * PROTEUS PENNERI   Staphylococcus epidermidis - MIC*    CIPROFLOXACIN <=0.5 SENSITIVE Sensitive     ERYTHROMYCIN <=0.25 SENSITIVE Sensitive     GENTAMICIN <=0.5 SENSITIVE Sensitive     OXACILLIN <=0.25 SENSITIVE Sensitive     TETRACYCLINE 2 SENSITIVE Sensitive     VANCOMYCIN 2 SENSITIVE Sensitive     TRIMETH/SULFA <=10 SENSITIVE Sensitive     CLINDAMYCIN <=0.25 SENSITIVE Sensitive     RIFAMPIN <=0.5 SENSITIVE Sensitive     Inducible Clindamycin NEGATIVE Sensitive     * STAPHYLOCOCCUS EPIDERMIDIS  Blood Culture ID Panel (Reflexed)     Status: Abnormal   Collection Time: 05/21/21  7:05 PM  Result Value Ref Range Status   Enterococcus faecalis NOT DETECTED NOT DETECTED Final   Enterococcus Faecium NOT DETECTED NOT DETECTED Final   Listeria monocytogenes NOT DETECTED NOT DETECTED Final   Staphylococcus species DETECTED (A) NOT DETECTED Final    Comment: READ BACK AND VERIFIED BY CARISSA DOLAN AT 2054 05/22/21 BY [facility]   Staphylococcus aureus (BCID) NOT DETECTED NOT DETECTED Final   Staphylococcus epidermidis DETECTED  (A) NOT DETECTED Final    Comment: READ BACK AND VERIFIED BY CARRISA DOLAN, AT 2045 05/22/21 BY [facility]   Staphylococcus lugdunensis NOT DETECTED NOT DETECTED Final   Streptococcus species NOT DETECTED NOT DETECTED Final   Streptococcus agalactiae NOT DETECTED NOT DETECTED Final   Streptococcus pneumoniae NOT DETECTED NOT DETECTED Final   Streptococcus pyogenes NOT DETECTED NOT DETECTED Final   A.calcoaceticus-baumannii NOT DETECTED NOT DETECTED Final   Bacteroides fragilis NOT DETECTED NOT DETECTED Final   Enterobacterales NOT DETECTED NOT DETECTED Final   Enterobacter cloacae complex NOT DETECTED NOT DETECTED Final  Escherichia coli NOT DETECTED NOT DETECTED Final   Klebsiella aerogenes NOT DETECTED NOT DETECTED Final   Klebsiella oxytoca NOT DETECTED NOT DETECTED Final   Klebsiella pneumoniae NOT DETECTED NOT DETECTED Final   Proteus species NOT DETECTED NOT DETECTED Final   Salmonella species NOT DETECTED NOT DETECTED Final   Serratia marcescens NOT DETECTED NOT DETECTED Final   Haemophilus influenzae NOT DETECTED NOT DETECTED Final   Neisseria meningitidis NOT DETECTED NOT DETECTED Final   Pseudomonas aeruginosa NOT DETECTED NOT DETECTED Final   Stenotrophomonas maltophilia NOT DETECTED NOT DETECTED Final   Candida albicans NOT DETECTED NOT DETECTED Final   Candida auris NOT DETECTED NOT DETECTED Final   Candida glabrata NOT DETECTED NOT DETECTED Final   Candida krusei NOT DETECTED NOT DETECTED Final   Candida parapsilosis NOT DETECTED NOT DETECTED Final   Candida tropicalis NOT DETECTED NOT DETECTED Final   Cryptococcus neoformans/gattii NOT DETECTED NOT DETECTED Final   Methicillin resistance mecA/C NOT DETECTED NOT DETECTED Final    Comment: Performed at [facility], [address] [place], [place] [address]  Blood Culture ID Panel (Reflexed)     Status: Abnormal   Collection Time: 05/21/21  7:05 PM  Result Value Ref Range Status   Enterococcus faecalis NOT DETECTED  NOT DETECTED Final   Enterococcus Faecium NOT DETECTED NOT DETECTED Final   Listeria monocytogenes NOT DETECTED NOT DETECTED Final   Staphylococcus species DETECTED (A) NOT DETECTED Final    Comment: CRITICAL RESULT CALLED TO, READ BACK BY AND VERIFIED WITH: JASON ROBBINS PHARMD 0522 05/23/21 HNM    Staphylococcus aureus (BCID) NOT DETECTED NOT DETECTED Final   Staphylococcus epidermidis NOT DETECTED NOT DETECTED Final   Staphylococcus lugdunensis NOT DETECTED NOT DETECTED Final   Streptococcus species NOT DETECTED NOT DETECTED Final   Streptococcus agalactiae NOT DETECTED NOT DETECTED Final   Streptococcus pneumoniae NOT DETECTED NOT DETECTED Final   Streptococcus pyogenes NOT DETECTED NOT DETECTED Final   A.calcoaceticus-baumannii NOT DETECTED NOT DETECTED Final   Bacteroides fragilis NOT DETECTED NOT DETECTED Final   Enterobacterales DETECTED (A) NOT DETECTED Final    Comment: Enterobacterales represent a large order of gram negative bacteria, not a single organism. CRITICAL RESULT CALLED TO, READ BACK BY AND VERIFIED WITH: JASON ROBBINS PHARMD 0522 05/23/21 HNM    Enterobacter cloacae complex NOT DETECTED NOT DETECTED Final   Escherichia coli NOT DETECTED NOT DETECTED Final   Klebsiella aerogenes NOT DETECTED NOT DETECTED Final   Klebsiella oxytoca NOT DETECTED NOT DETECTED Final   Klebsiella pneumoniae NOT DETECTED NOT DETECTED Final   Proteus species DETECTED (A) NOT DETECTED Final    Comment: CRITICAL RESULT CALLED TO, READ BACK BY AND VERIFIED WITH: JASON ROBBINS PHARMD 0522 05/23/21 HNM    Salmonella species NOT DETECTED NOT DETECTED Final   Serratia marcescens NOT DETECTED NOT DETECTED Final   Haemophilus influenzae NOT DETECTED NOT DETECTED Final   Neisseria meningitidis NOT DETECTED NOT DETECTED Final   Pseudomonas aeruginosa NOT DETECTED NOT DETECTED Final   Stenotrophomonas maltophilia NOT DETECTED NOT DETECTED Final   Candida albicans NOT DETECTED NOT DETECTED Final    Candida auris NOT DETECTED NOT DETECTED Final   Candida glabrata NOT DETECTED NOT DETECTED Final   Candida krusei NOT DETECTED NOT DETECTED Final   Candida parapsilosis NOT DETECTED NOT DETECTED Final   Candida tropicalis NOT DETECTED NOT DETECTED Final   Cryptococcus neoformans/gattii NOT DETECTED NOT DETECTED Final   CTX-M ESBL NOT DETECTED NOT DETECTED Final   Carbapenem resistance  IMP NOT DETECTED NOT DETECTED Final   Carbapenem resistance KPC NOT DETECTED NOT DETECTED Final   Carbapenem resistance NDM NOT DETECTED NOT DETECTED Final   Carbapenem resist OXA 48 LIKE NOT DETECTED NOT DETECTED Final   Carbapenem resistance VIM NOT DETECTED NOT DETECTED Final    Comment: Performed at [facility], [address] [place], [place] [address]  Resp Panel by RT-PCR (Flu A&B, Covid)     Status: None   Collection Time: 05/21/21  9:46 PM   Specimen: Nasopharyngeal(NP) swabs in vial transport medium  Result Value Ref Range Status   SARS Coronavirus 2 by RT PCR NEGATIVE NEGATIVE Final    Comment: (NOTE) SARS-CoV-2 target nucleic acids are NOT DETECTED.  The SARS-CoV-2 RNA is generally detectable in upper respiratory specimens during the acute phase of infection. The lowest concentration of SARS-CoV-2 viral copies this assay can detect is 138 copies/mL. A negative result does not preclude SARS-Cov-2 infection and should not be used as the sole basis for treatment or other patient management decisions. A negative result may occur with  improper specimen collection/handling, submission of specimen other than nasopharyngeal swab, presence of viral mutation(s) within the areas targeted by this assay, and inadequate number of viral copies(<138 copies/mL). A negative result must be combined with clinical observations, patient history, and epidemiological information. The expected result is Negative.  Fact Sheet for Patients:  EntrepreneurPulse.com.au  Fact Sheet for  Healthcare Providers:  IncredibleEmployment.be  This test is no t yet approved or cleared by the [place] FDA and  has been authorized for detection and/or diagnosis of SARS-CoV-2 by FDA under an Emergency Use Authorization (EUA). This EUA will remain  in effect (meaning this test can be used) for the duration of the COVID-19 declaration under Section 564(b)(1) of the Act, 21 U.S.C.section 360bbb-3(b)(1), unless the authorization is terminated  or revoked sooner.       Influenza A by PCR NEGATIVE NEGATIVE Final   Influenza B by PCR NEGATIVE NEGATIVE Final    Comment: (NOTE) The Xpert Xpress SARS-CoV-2/FLU/RSV plus assay is intended as an aid in the diagnosis of influenza from Nasopharyngeal swab specimens and should not be used as a sole basis for treatment. Nasal washings and aspirates are unacceptable for Xpert Xpress SARS-CoV-2/FLU/RSV testing.  Fact Sheet for Patients: EntrepreneurPulse.com.au  Fact Sheet for Healthcare Providers: IncredibleEmployment.be  This test is not yet approved or cleared by the [place] FDA and has been authorized for detection and/or diagnosis of SARS-CoV-2 by FDA under an Emergency Use Authorization (EUA). This EUA will remain in effect (meaning this test can be used) for the duration of the COVID-19 declaration under Section 564(b)(1) of the Act, 21 U.S.C. section 360bbb-3(b)(1), unless the authorization is terminated or revoked.  Performed at [facility], [place], [place] [address]   Aerobic/Anaerobic Culture w Gram Stain (surgical/deep wound)     Status: None   Collection Time: 05/22/21  1:24 AM   Specimen: CSF; Body Fluid  Result Value Ref Range Status   Specimen Description   Final    CSF Performed at [facility], [address] [place], [place] [address]    Special Requests   Final    NONE Performed at [facility], [place], [place] [address]    Gram Stain NO WBC SEEN NO ORGANISMS SEEN   Final   Culture   Final    No growth aerobically or anaerobically. Performed at [facility], [address], [place], [place] [address]    Report Status 05/27/2021 FINAL  Final  Culture, blood (Routine X 2) w Reflex to ID Panel     Status: None   Collection Time: 05/22/21  3:02 AM   Specimen: BLOOD  Result Value Ref Range Status   Specimen Description BLOOD RIGHT HAND  Final   Special Requests   Final    BOTTLES DRAWN AEROBIC ONLY Blood Culture results may not be optimal due to an inadequate volume of blood received in culture bottles   Culture   Final    NO GROWTH 5 DAYS Performed at [facility], [address] [place], [place] [address]    Report Status 05/27/2021 FINAL  Final    Studies/Results: DG Abd 1 View  Result Date: 05/29/2021 CLINICAL DATA:  NG tube placement. EXAM: ABDOMEN - 1 VIEW COMPARISON:  05/28/2021 FINDINGS: An enteric tube remains in place with tip in the region of the gastric body and side hole near the gastric fundus. No dilated loops of bowel are seen to suggest obstruction. No acute osseous abnormality is seen. IMPRESSION: Enteric tube terminating over the gastric body. Electronically Signed   By: Logan Bores M.D.   On: 05/29/2021 17:10   CT HEAD WO CONTRAST (5MM)  Result Date: 05/30/2021 CLINICAL DATA:  Hydrocephalus.  Follow-up. EXAM: CT HEAD WITHOUT CONTRAST TECHNIQUE: Contiguous axial images were obtained from the base of the skull through the vertex without intravenous contrast. COMPARISON:  05/28/2021 FINDINGS: Brain: No change since the study of 2 days ago. Ventriculoperitoneal shunt placed from a left parietal approach is unchanged. Ventricular size is stable. Extensive chronic low-density throughout the cerebral hemispheric white matter appears the same. 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 Old right temporal lobe  infarction. Subacute infarction in the right basal ganglia appears similar. No new infarction, mass lesion, hemorrhage or extra-axial collection. Vascular: There is atherosclerotic calcification of the major vessels at the base of the brain. Skull: Negative except for previous burr holes. Sinuses/Orbits: Clear/normal Other: None IMPRESSION: No significant change since the study of 2 days ago. VP shunt in place with stable ventricular size. Extensive chronic small-vessel ischemic changes throughout the brain as above. Subacute infarction in the right basal ganglia/radiating white matter tracts is unchanged by CT. Electronically Signed   By: Nelson Chimes M.D.   On: 05/30/2021 09:01    Assessment/Plan: Shylee Durrett is a 65 y.o. 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 She had extensive skin bruising and also a wound on post neck where her head had rested against a basket. Also has multiple skin lesions and was apparently infested with bedbugs and cockroaches on admission.  She was found to have malfunctioning of her VA shunt with the distal end coiled in the neck.  She underwent removal of her VA shunt as well as placement of external ventricular drain.  She was found to have bacteremia with 1 set of cultures growing Proteus as well as staph species and the set of blood cultures other growing staph epidermidis.  She does have a Port-A-Cath in place.   I suspect her bacteremia is from her skin on her legs as well as newer lesions on her being down especially infected appearing wound on the posterior neck.  No evidence that her VP shunt was infected apparently.  Cultures are negative.  She has had CT of her chest  abdomen and pelvis.  Only finding was some mild atelectasis. She has been on cefepime and has received 1 dose of Vanco Follow-up blood cultures 10/11 no growth to date COVID.   10/14- no fevers,  October 17-no fevers.  White  count down to 12.  Remains in the unit.  CSF cultures negative.  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  She has been seen by neurology.  TEE is planned.  Oct 18- remains stable TEE could not be done. Dr Nehemiah Massed reviewed echo and reports good images and low risk of endocarditis  Oct 19, no fever, EVD removed.   I think her risk of endocarditis is low. Staph epi and proteus are unlikely causes of endocarditis.  The staph epi would be more likely to cause infection of the portacath but that would not lead to risk of embolic CVA. She has cleared her cultures. CSF cultures negative as well.   Recommendations Cont linezolid x 14d ays planned course for GP coverage given skin and soft tissue infection.  She has a portacath in so monitor for recurrent infection after stopping abx.  Today is day 9  of  abx coverage for proteus in blood cutlure- can dc tomorrow after 10 day course is complete.  Continue wound care.  After abx course is finished I would suggest monitoring for any new signs of infection and if needed repeat bcx to eval for recurrent bacteremia possibly related to her portacath.   Thank you very much for the consult.  ID will sign off but please call with questions. Leonel Ramsay   05/30/2021, 2:48 PM

## 2021-05-30 NOTE — Plan of Care (Signed)
Discussed briefly with Dr. Callie Fielding on 10/18.  Unfortunately patient was unable to undergo TEE to rule out endocarditis definitively secondary to esophageal stricture which prevented passing of a probe.  However per family she is now at her baseline, and I appreciate IDs guidance with regards to her antibiotic course given the data we are able to obtain.  Please see progress note from 1017 for full recommendations, neurology will be available on an as-needed basis going forward and please do reach out if new questions or concerns arise  Lesleigh Noe MD-PhD [facility] [phone] [facility] coverage for [facility] is from 8 AM to 4 AM in-house and 4 PM to 8 PM by telephone/video. 8 PM to 8 AM emergent questions or overnight urgent questions should be addressed to Teleneurology On-call or [facility] neurohospitalist; contact information can be found on AMION

## 2021-05-30 NOTE — Progress Notes (Signed)
Attending Progress Note  History: Desiree Mitchell presenting on 05/21/21 with after being found unresponsive by family. She has a history of prior subarachnoid hemorrhage requiring shunt placement. Resulting in neurosurgery consult. CT head showed increased ventricular size concerning for shunt malfunction.  She underwent removal of shunt and placement of EVD on 05/21/21.   POD#9: CT head stable this morning. EVD removed.  POD#8 Patient went for echo today given concern for emboli. EVD remained clamp and patient appears to be at baseline neurologically  POD#7 CT stable after EVD clamped for 24 hours. Sleepy but arousable.   POD#5: Stable CSF output, Patient improved alertness without headache. Increasing level of EVD to 25.  POD#4: MRI showing some watershed type infarcts  POD#3: NAEO  POD#2: patient extubated yesterday but increased O2 requirement overnight   POD#1: Currently off all sedation  Physical Exam: Vitals:   05/30/21 1000 05/30/21 1100  BP: 128/74 (!) 150/92  Pulse: 88 99  Resp: 15 15  Temp:    SpO2: 99% 98%    PERRL Verbalizes displeasure with going for CT scan. Does not answer orientation questions Wiggles toes, thumbs up on command, right more spontaneous than left CSF remains clear.    Data:  Recent Labs  Lab 05/28/21 0458 05/29/21 0449 05/30/21 0442  NA 136 136 136  K 4.0 4.4 4.3  CL 100 104 102  CO2 28 29 27   BUN 21 18 20   CREATININE 0.89 0.84 0.79  GLUCOSE 148* 107* 138*  CALCIUM 7.7* 7.5* 7.9*    Recent Labs  Lab 05/25/21 0517  AST 47*  ALT 41  ALKPHOS 58      Recent Labs  Lab 05/27/21 0541 05/28/21 0458 05/29/21 0449  WBC 12.9* 12.8* 14.0*  HGB 11.6* 11.6* 11.3*  HCT 35.2* 36.5 34.0*  PLT 314 317 322    No results for input(s): APTT, INR in the last 168 hours.       Other tests/results:  CT head  IMPRESSION: 1. Interval development of mild hydrocephalus and periventricular edema. Associated malpositioned left VA shunt  that terminates in the anterior mediastinum and no longer within the left brachiocephalic vein as noted on CT miscellaneous 10/03/2011. The shunt is noted to be coiled within the neck. Recommend neurosurgery consultation. 2. No acute displaced fracture or traumatic listhesis of the cervical spine. 3. Ground-glass airspace opacity within the right lower lobe and patchy airspace opacity within left lower lobe. Findings may represent a combination of infection/inflammation as well as atelectasis. 4. No acute traumatic injury to the chest, abdomen, or pelvis with limited evaluation on this noncontrast study.   5. No acute fracture or traumatic malalignment of the thoracic or lumbar spine. 6.  Aortic Atherosclerosis (ICD10-I70.0). 7. Interval development of an enlarged right thyroid gland with limited evaluation due to streak artifact. Recommend thyroid ultrasound (ref: J Am Coll Radiol. 2015 Feb;12(2): 143-50).   These results were called by telephone at the time of interpretation on 05/21/2021 at 9:48 pm to provider [facility] , who verbally acknowledged these results.   Electronically Signed   By: Iven Finn M.D.   On: 05/21/2021 21:54   Narrative & Impression  CLINICAL DATA:  Neuro deficit, acute, stroke suspected   EXAM: MRI HEAD WITHOUT CONTRAST   TECHNIQUE: Multiplanar, multiecho pulse sequences of the brain and surrounding structures were obtained without intravenous contrast.   COMPARISON:  Correlation made with prior CTs   FINDINGS: Brain: There are a few scattered small foci of reduced diffusion including  involvement of the right basal ganglia and corona radiata, left basal ganglia, right inferior parietal cortex, parasagittal left frontal cortex, and anterior right cerebellum.   Left posterior approach ventricular catheter extends to the right of midline traversing the third ventricle with tip extending laterally between frontal horn and third ventricle.  Prior right frontal catheter tract. Ventricles are similar in size to recent CT imaging.   Right anterior temporal encephalomalacia. 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 Chronic infarcts and/or perivascular spaces along the central cerebral white matter and deep gray nuclei. Patchy and confluent areas of T2 hyperintensity in the supratentorial white matter may reflect chronic microvascular ischemic changes. Scattered foci of susceptibility likely reflecting chronic microhemorrhages due to hypertension.   No intracranial mass or mass effect.   Vascular: Major vessel flow voids at the sk few scattered small acute ull base are preserved.   Skull and upper cervical spine: Normal marrow signal is preserved.   Sinuses/Orbits: Paranasal sinuses are aerated. Orbits are unremarkable.   Other: Sella is unremarkable.  Mastoid air cells are clear.   IMPRESSION: Few scattered small acute infarcts involving different vascular territories.   Ventricular catheter present with similar caliber of ventricles as compared to recent CT.   Chronic microvascular ischemic changes, chronic infarcts, and chronic microhemorrhages.     Electronically Signed   By: Macy Mis M.D.   On: 05/25/2021 15:31    Assessment/Plan:  Desiree Mitchell is a 66 y.o presenting to the ER after being found down by her family on 05/21/21. She was found to be septic and head CT was concerning for shunt malfunction.  She underwent removal of VA shunt on 05/21/2021 and placement of EVD.  - improved neurologically this morning. Head CT stable  - EVD removed this morning. Can resume DVT ppx 10/20 from neurosurgical standpoint - CSF cultures NTD - MRI demonstrated multifocal infarcts, please consult neurology for further management.  - remainder of management per recommendations of critical care.  Cooper Render PA-C Department of Neurosurgery

## 2021-05-30 NOTE — Progress Notes (Signed)
[place] at [place] NAME: Desiree Mitchell    MR#:  [id number]  DATE OF BIRTH:  [birth year]  SUBJECTIVE:  patient very sleepy although follows commands. Earlier removal of EVD neurosurgery. Tube feeding REVIEW OF SYSTEMS:   Review of Systems  Unable to perform ROS: Medical condition  Tolerating Diet: Tolerating PT:   DRUG ALLERGIES:  No Known Allergies  VITALS:  Blood pressure (!) 165/91, pulse 97, temperature 99.4 F (37.4 C), temperature source Oral, resp. rate 13, height 5\' 2"  (1.575 m), weight 85.2 kg, SpO2 99 %.  PHYSICAL EXAMINATION:   Physical Exam  GENERAL:  65 y.o.-year-old patient lying in the bed with no acute distress.  HEENT: Head atraumatic, normocephalic. Oropharynx and nasopharynx clear. NG+ LUNGS: Normal breath sounds bilaterally, no wheezing, rales, rhonchi. No use of accessory muscles of respiration.  CARDIOVASCULAR: S1, S2 normal. No murmurs, rubs, or gallops.  ABDOMEN: Soft, nontender, nondistended. Bowel sounds present.  EXTREMITIES: No cyanosis, clubbing or edema b/l.    NEUROLOGIC: unable to assess today pt sleepy PSYCHIATRIC:  patient is sleepy.  SKIN:  Pressure Injury 05/22/21 Buttocks Right Deep Tissue Pressure Injury - Purple or maroon localized area of discolored intact skin or blood-filled blister due to damage of underlying soft tissue from pressure and/or shear. (Active)  05/22/21 0300  Location: Buttocks  Location Orientation: Right  Staging: Deep Tissue Pressure Injury - Purple or maroon localized area of discolored intact skin or blood-filled blister due to damage of underlying soft tissue from pressure and/or shear.  Wound Description (Comments):   Present on Admission: Yes     Pressure Injury 05/22/21 Coccyx Stage 2 -  Partial thickness loss of dermis presenting as a shallow open injury with a red, pink wound bed without slough. (Active)  05/22/21 0300  Location: Coccyx  Location Orientation:    Staging: Stage 2 -  Partial thickness loss of dermis presenting as a shallow open injury with a red, pink wound bed without slough.  Wound Description (Comments):   Present on Admission: Yes     Pressure Injury 05/22/21 Cervical Deep Tissue Pressure Injury - Purple or maroon localized area of discolored intact skin or blood-filled blister due to damage of underlying soft tissue from pressure and/or shear. (Active)  05/22/21 0300  Location: Cervical  Location Orientation:   Staging: Deep Tissue Pressure Injury - Purple or maroon localized area of discolored intact skin or blood-filled blister due to damage of underlying soft tissue from pressure and/or shear.  Wound Description (Comments):   Present on Admission: Yes        LABORATORY PANEL:  CBC Recent Labs  Lab 05/29/21 0449  WBC 14.0*  HGB 11.3*  HCT 34.0*  PLT 322    Chemistries  Recent Labs  Lab 05/25/21 0517 05/25/21 2005 05/30/21 0442  NA 151*   < > 136  K 4.2   < > 4.3  CL 118*   < > 102  CO2 25   < > 27  GLUCOSE 118*   < > 138*  BUN 46*   < > 20  CREATININE 1.19*   < > 0.79  CALCIUM 8.1*   < > 7.9*  MG 3.1*   < > 1.9  AST 47*  --   --   ALT 41  --   --   ALKPHOS 58  --   --   BILITOT 1.0  --   --    < > = values  in this interval not displayed.   Cardiac Enzymes No results for input(s): TROPONINI in the last 168 hours. RADIOLOGY:  DG Abd 1 View  Result Date: 05/29/2021 CLINICAL DATA:  NG tube placement. EXAM: ABDOMEN - 1 VIEW COMPARISON:  05/28/2021 FINDINGS: An enteric tube remains in place with tip in the region of the gastric body and side hole near the gastric fundus. No dilated loops of bowel are seen to suggest obstruction. No acute osseous abnormality is seen. IMPRESSION: Enteric tube terminating over the gastric body. Electronically Signed   By: Logan Bores M.D.   On: 05/29/2021 17:10   CT HEAD WO CONTRAST (5MM)  Result Date: 05/30/2021 CLINICAL DATA:  Hydrocephalus.  Follow-up. EXAM: CT  HEAD WITHOUT CONTRAST TECHNIQUE: Contiguous axial images were obtained from the base of the skull through the vertex without intravenous contrast. COMPARISON:  05/28/2021 FINDINGS: Brain: No change since the study of 2 days ago. Ventriculoperitoneal shunt placed from a left parietal approach is unchanged. Ventricular size is stable. Extensive chronic low-density throughout the cerebral hemispheric white matter appears the same. 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 Subacute infarction in the right basal ganglia appears similar. No new infarction, mass lesion, hemorrhage or extra-axial collection. Vascular: There is atherosclerotic calcification of the major vessels at the base of the brain. Skull: Negative except for previous burr holes. Sinuses/Orbits: Clear/normal Other: None IMPRESSION: No significant change since the study of 2 days ago. VP shunt in place with stable ventricular size. Extensive chronic small-vessel ischemic changes throughout the brain as above. Subacute infarction in the right basal ganglia/radiating white matter tracts is unchanged by CT. Electronically Signed   By: Nelson Chimes M.D.   On: 05/30/2021 09:01   ASSESSMENT AND PLAN:   Desiree Mitchell is a 65 y.o. 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 She had extensive skin bruising and also a wound on post neck where her head had rested against a basket. Also has multiple skin lesions and was apparently infested with bedbugs and cockroaches on admission.  Severe sepsis without shock due to bacteremia from her skin on legs as well as posterior neck present on admission -- ID consult appreciated. Will need linezolid for 14 days for gram-positive coverage given soft tissue infection -- continue IV Rocephin for Proteus infection in the blood for 10 days (9/10) -- Dr. Ola Spurr recommends continue monitor  signs of infection after stopping antibiotics -- unable to do TEE secondary to esophageal stricture--- low risk for endocarditis per ID rec assessment-- continue above antibiotics  VA shunt malfunction status post EVD placement on 10/11 by Dr. Lacinda Axon neurosurgery -- patient improving neurology currently. Repeat CT head stable -- removed 10/19 at bedside per Dr. 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 and possible shunt malfunction positive blood culture with embolic infarcts raise possibility of endocarditis but no obvious vegetations were seen onTTE and patient not able to tolerate TEE due to esophageal  stricture --- start daily aspirin -- speech therapy to follow -- once able to tolerate overall DC to feeding -- wean to room air     Procedures:EVD shunt removal Family communication :none today Consults :ID,cards,neurosurgery CODE STATUS: FULL DVT Prophylaxis :SCD Level of care: Stepdown Status is: Inpatient  Remains inpatient appropriate because: multiple comorbidities and slow improvement of above mentioned issues        TOTAL TIME TAKING CARE OF THIS PATIENT: 35 minutes.  >50% time spent on counselling and coordination of care  Note: This dictation was prepared with Dragon dictation along with smaller phrase technology. Any transcriptional errors that result from this process are unintentional.  Fritzi Mandes M.D    [facility]   CC: Primary care physician; Pcp, No Patient ID:  Desiree Mitchell, female   DOB: [birth year], 65 y.o.   MRN: [id number]

## 2021-05-31 DIAGNOSIS — N179 Acute kidney failure, unspecified: Secondary | ICD-10-CM | POA: Diagnosis not present

## 2021-05-31 DIAGNOSIS — Z515 Encounter for palliative care: Secondary | ICD-10-CM | POA: Diagnosis not present

## 2021-05-31 DIAGNOSIS — J9601 Acute respiratory failure with hypoxia: Secondary | ICD-10-CM | POA: Diagnosis not present

## 2021-05-31 DIAGNOSIS — Z789 Other specified health status: Secondary | ICD-10-CM

## 2021-05-31 DIAGNOSIS — A419 Sepsis, unspecified organism: Secondary | ICD-10-CM | POA: Diagnosis not present

## 2021-05-31 LAB — GLUCOSE, CAPILLARY
Glucose-Capillary: 101 mg/dL — ABNORMAL HIGH (ref 70–99)
Glucose-Capillary: 110 mg/dL — ABNORMAL HIGH (ref 70–99)
Glucose-Capillary: 115 mg/dL — ABNORMAL HIGH (ref 70–99)
Glucose-Capillary: 127 mg/dL — ABNORMAL HIGH (ref 70–99)
Glucose-Capillary: 131 mg/dL — ABNORMAL HIGH (ref 70–99)
Glucose-Capillary: 137 mg/dL — ABNORMAL HIGH (ref 70–99)

## 2021-05-31 LAB — BASIC METABOLIC PANEL
Anion gap: 8 (ref 5–15)
BUN: 19 mg/dL (ref 8–23)
CO2: 27 mmol/L (ref 22–32)
Calcium: 8 mg/dL — ABNORMAL LOW (ref 8.9–10.3)
Chloride: 102 mmol/L (ref 98–111)
Creatinine, Ser: 0.79 mg/dL (ref 0.44–1.00)
GFR, Estimated: >60 mL/min (ref >60)
Glucose, Bld: 139 mg/dL — ABNORMAL HIGH (ref 70–99)
Potassium: 4.5 mmol/L (ref 3.5–5.1)
Sodium: 137 mmol/L (ref 135–145)

## 2021-05-31 LAB — MAGNESIUM: Magnesium: 2.2 mg/dL (ref 1.7–2.4)

## 2021-05-31 MED ORDER — METOPROLOL TARTRATE 25 MG PO TABS
25.0000 mg | ORAL_TABLET | Freq: Two times a day (BID) | ORAL | Status: DC
  Administered 2021-05-31 – 2021-06-08 (×17): 25 mg via ORAL
  Filled 2021-05-31 (×17): qty 1

## 2021-05-31 MED ORDER — THIAMINE HCL 100 MG PO TABS
100.0000 mg | ORAL_TABLET | Freq: Every day | ORAL | Status: DC
  Administered 2021-06-01 – 2021-06-07 (×7): 100 mg via ORAL
  Filled 2021-05-31 (×7): qty 1

## 2021-05-31 MED ORDER — ENOXAPARIN SODIUM 60 MG/0.6ML IJ SOSY
0.5000 mg/kg | PREFILLED_SYRINGE | INTRAMUSCULAR | Status: DC
  Administered 2021-05-31 – 2021-06-03 (×4): 45 mg via SUBCUTANEOUS
  Filled 2021-05-31: qty 0.45
  Filled 2021-05-31: qty 0.6
  Filled 2021-05-31: qty 0.45
  Filled 2021-05-31: qty 0.6

## 2021-05-31 MED ORDER — ASPIRIN 81 MG PO CHEW
81.0000 mg | CHEWABLE_TABLET | Freq: Every day | ORAL | Status: DC
  Administered 2021-05-31 – 2021-06-07 (×8): 81 mg
  Filled 2021-05-31 (×8): qty 1

## 2021-05-31 MED ORDER — PANTOPRAZOLE 2 MG/ML SUSPENSION
40.0000 mg | Freq: Every day | ORAL | Status: DC
  Administered 2021-06-01 – 2021-06-07 (×7): 40 mg
  Filled 2021-05-31 (×9): qty 20

## 2021-05-31 NOTE — Progress Notes (Signed)
[place] at [place] NAME: Francheska Villeda    MR#:  [id number]  DATE OF BIRTH:  1955-11-09  SUBJECTIVE:  patient more awake today. She leans her head towards the right. Able to move her right upper lower extremity and left lower extremity. Very weak on left upper extremity. Speech is somewhat garbled stays a few words. Worked with speech therapy earlier signs of aspiration noted. Tube feeding to be continued.   REVIEW OF SYSTEMS:   Review of Systems  Unable to perform ROS: Medical condition  Tolerating Diet:no--NG tube Tolerating PT: pending  DRUG ALLERGIES:  No Known Allergies  VITALS:  Blood pressure (!) 164/92, pulse 82, temperature 98 F (36.7 C), temperature source Axillary, resp. rate 15, height 5\' 2"  (1.575 m), weight 91.2 kg, SpO2 100 %.  PHYSICAL EXAMINATION:   Physical Exam  GENERAL:  65 y.o.-year-old patient lying in the bed with no acute distress. obese HEENT: Head atraumatic, normocephalic. Oropharynx and nasopharynx clear. NG+ LUNGS: Normal breath sounds bilaterally, no wheezing, rales, rhonchi. No use of accessory muscles of respiration.  CARDIOVASCULAR: S1, S2 normal. No murmurs, rubs, or gallops.  ABDOMEN: Soft, nontender, nondistended. Bowel sounds present.  EXTREMITIES: No cyanosis, clubbing or edema b/l.    NEUROLOGIC: left hemiparesis PSYCHIATRIC:  patient is alert on VC SKIN:  Pressure Injury 05/22/21 Buttocks Right Deep Tissue Pressure Injury - Purple or maroon localized area of discolored intact skin or blood-filled blister due to damage of underlying soft tissue from pressure and/or shear. (Active)  05/22/21 0300  Location: Buttocks  Location Orientation: Right  Staging: Deep Tissue Pressure Injury - Purple or maroon localized area of discolored intact skin or blood-filled blister due to damage of underlying soft tissue from pressure and/or shear.  Wound Description (Comments):   Present on Admission: Yes      Pressure Injury 05/22/21 Coccyx Stage 2 -  Partial thickness loss of dermis presenting as a shallow open injury with a red, pink wound bed without slough. (Active)  05/22/21 0300  Location: Coccyx  Location Orientation:   Staging: Stage 2 -  Partial thickness loss of dermis presenting as a shallow open injury with a red, pink wound bed without slough.  Wound Description (Comments):   Present on Admission: Yes     Pressure Injury 05/22/21 Cervical Deep Tissue Pressure Injury - Purple or maroon localized area of discolored intact skin or blood-filled blister due to damage of underlying soft tissue from pressure and/or shear. (Active)  05/22/21 0300  Location: Cervical  Location Orientation:   Staging: Deep Tissue Pressure Injury - Purple or maroon localized area of discolored intact skin or blood-filled blister due to damage of underlying soft tissue from pressure and/or shear.  Wound Description (Comments):   Present on Admission: Yes        LABORATORY PANEL:  CBC Recent Labs  Lab 05/29/21 0449  WBC 14.0*  HGB 11.3*  HCT 34.0*  PLT 322     Chemistries  Recent Labs  Lab 05/25/21 0517 05/25/21 2005 05/31/21 0338  NA 151*   < > 137  K 4.2   < > 4.5  CL 118*   < > 102  CO2 25   < > 27  GLUCOSE 118*   < > 139*  BUN 46*   < > 19  CREATININE 1.19*   < > 0.79  CALCIUM 8.1*   < > 8.0*  MG 3.1*   < > 2.2  AST 47*  --   --  ALT 41  --   --   ALKPHOS 58  --   --   BILITOT 1.0  --   --    < > = values in this interval not displayed.    Cardiac Enzymes No results for input(s): TROPONINI in the last 168 hours. RADIOLOGY:  DG Abd 1 View  Result Date: 05/29/2021 CLINICAL DATA:  NG tube placement. EXAM: ABDOMEN - 1 VIEW COMPARISON:  05/28/2021 FINDINGS: An enteric tube remains in place with tip in the region of the gastric body and side hole near the gastric fundus. No dilated loops of bowel are seen to suggest obstruction. No acute osseous abnormality is seen.  IMPRESSION: Enteric tube terminating over the gastric body. Electronically Signed   By: Logan Bores M.D.   On: 05/29/2021 17:10   CT HEAD WO CONTRAST (5MM)  Result Date: 05/30/2021 CLINICAL DATA:  Hydrocephalus.  Follow-up. EXAM: CT HEAD WITHOUT CONTRAST TECHNIQUE: Contiguous axial images were obtained from the base of the skull through the vertex without intravenous contrast. COMPARISON:  05/28/2021 FINDINGS: Brain: No change since the study of 2 days ago. Ventriculoperitoneal shunt placed from a left parietal approach is unchanged. Ventricular size is stable. Extensive chronic low-density throughout the cerebral hemispheric white matter appears the same. 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 Subacute infarction in the right basal ganglia appears similar. No new infarction, mass lesion, hemorrhage or extra-axial collection. Vascular: There is atherosclerotic calcification of the major vessels at the base of the brain. Skull: Negative except for previous burr holes. Sinuses/Orbits: Clear/normal Other: None IMPRESSION: No significant change since the study of 2 days ago. VP shunt in place with stable ventricular size. Extensive chronic small-vessel ischemic changes throughout the brain as above. Subacute infarction in the right basal ganglia/radiating white matter tracts is unchanged by CT. Electronically Signed   By: Nelson Chimes M.D.   On: 05/30/2021 09:01   ASSESSMENT AND PLAN:   Walaa Carel is a 65 y.o. 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 She had extensive skin bruising and also a wound on post neck where her head had rested against a basket. Also has multiple skin lesions and was apparently infested with bedbugs and cockroaches on admission.  Severe sepsis without shock due to bacteremia from her skin on legs as well as posterior neck present on admission --  ID consult appreciated. Will need linezolid for 14 days for gram-positive coverage given soft tissue infection -- continue IV Rocephin for Proteus infection in the blood for 10 days (10/10) -- Dr. Ola Spurr recommends continue monitor signs of infection after stopping antibiotics -- unable to do TEE secondary to esophageal stricture--- low risk for endocarditis per ID rec assessment-- continue above antibiotics  VA shunt malfunction status post EVD placement on 10/11 by Dr. Lacinda Axon neurosurgery -- patient improving neurology currently. Repeat CT head stable -- removed 10/19 at bedside per Dr. Lacinda Axon -- CSF cultures negative --ok to start DVT proph  posterior neck, buttock, coccyx pressure injuries secondary to being found down at home for unknown amount of time -- continue wound care per wound consult  rhabdomyolysis secondary to hypovolemia in the setting of dehydration -- present on admission now improving  acute respiratory failure with hypoxia  in the postoperative setting after neurological be a shunt requiring mechanical ventilator -- now extubated -- sats hundred percent on 2 L--wean to RA  multifocal embolic infarcts in the setting of sepsis and possible shunt malfunction positive blood culture with embolic infarcts raise possibility of endocarditis but no obvious vegetations were seen onTTE and patient not able to tolerate TEE due to esophageal stricture --- start daily aspirin -- speech therapy to follow -- once able to tolerate overall DC to feeding -- wean to room air  Dyphagia/Nutrition -- patient evaluated by speech therapy--- progression. Recommends continue NG tube feeding. Speech will follow on daily basis. Discussed with daughter that if patient continues to show signs of aspiration will need to consider other option for feeding  like PEG -- continue NG tube feeding for now   Procedures:EVD shunt removal Family communication :[name] on the phone 10/20 Consults  :ID,cards,neurosurgery CODE STATUS: FULL DVT Prophylaxis :SCD Level of care: Stepdown Status is: Inpatient  Remains inpatient appropriate because: multiple comorbidities and slow improvement of above mentioned issues  TOTAL TIME TAKING CARE OF THIS PATIENT: 35 minutes.  >50% time spent on counselling and coordination of care  Note: This dictation was prepared with Dragon dictation along with smaller phrase technology. Any transcriptional errors that result from this process are unintentional.  Fritzi Mandes M.D    [facility]   CC: Primary care physician; Pcp, No Patient ID: Taya Ashbaugh, female   DOB: [birth year], 65 y.o.   MRN: [id number]

## 2021-05-31 NOTE — Progress Notes (Addendum)
Palliative Care Progress Note, Assessment & Plan   Patient Name: Desiree Mitchell       Date: 05/31/2019 DOB: Jan 08, 1956  Age: 65 y.o. MRN#: [id number] Attending Physician: Fritzi Mandes, MD Primary Care Physician: Pcp, No Admit Date: 05/21/2021  Reason for Consultation/Follow-up: Establishing goals of care  Subjective: Pt is resting in bed in NAD. She is minimally interactive and somnolent. When aroused, she will intermittently track me with her eyes. She does not verbalize anything to me when attempting to speak with her.   HPI: 65 y.o. 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    Patient underwent a removal of shunt and placement of EVD.  Patient was successfully extubated on 10/11.  Neurology is consulted for multiple infarcts. PMT was consulted for [place] discussion.   Plan of Care: I have reviewed medical records including EPIC notes, labs and imaging, assessed the patient and then spoke with patient's daughter [name] over the phone to discuss diagnosis prognosis, [place], EOL wishes, disposition and options.  I introduced Palliative Medicine as specialized medical care for people living with serious illness. It focuses on providing relief from the symptoms and stress of a serious illness. The goal is to improve quality of life for both the patient and the family.  We discussed patient's current illness and what it means in the larger context of patient's on-going co-morbidities.  [name] asked appropriate questions regarding the patient's lab work.  Natural disease trajectory and expectations at EOL were discussed.  I  outlined that with the patient's bacteremia, shunt, compromised respiratory status, and high risk for aspiration, she has many factors that make a meaningful recovery challenging at this point in time.  [name] says she has seen improvements every day in her mother and would like to continue with the current plan of care.  I attempted to elicit values and goals of care important to the patient.  [name] conveys that her mom was a very private person.  [name] does not know what her mother's advance care planning wishes are.  She said her mother kept her doctors appointments private.  [name] remarks that she knows that her mother has had difficulty since 2007 but that her mother has maintained independence and privacy regarding her health to this point.  I reviewed that in addition to the aspiration risk and bacteremia, her mother's mental status is of great concern.  I shared I am not sure she would make a full recovery to have a significant understanding of how to interact with her world.  I educated [place] that while her mother is making progress there is still a lot of challenges associated with her neurological status.   [name] is not ready to make a change to her mother's CODE STATUS at this time.  She says she has 4 other people to care for and she is the hub of communication for everyone in her family.  She says she has to share all of this information with her family and then they can make a decision.  I reviewed  that my colleague Aniceto Boss has already reviewed these with the family.  She says she is not ready to make any changes at the moment and is happy to see the progress that her mother is making.  [name] would like access to patient's MyChart. I conveyed this concern to the nursing staff as I am unclear on if [name] is allowed access.    Discussed with [name] the importance of continued conversation with family and the medical providers regarding overall plan of care and treatment options, ensuring decisions  are within the context of the patient's values and GOCs.      Questions and concerns were addressed. The family was encouraged to call with questions or concerns.   Code Status: Full code  Prognosis:  Unable to determine  Discharge Planning: [place] for rehab with Palliative care service follow-up  Recommendations/Plan: Continue FULL CODE Treat the treatable [name] conveys that the family wants all available and appropriate medical interventions  Plan for SNF  Care plan was discussed with patient, patient's daughter  Length of Stay: 9  Physical Exam Constitutional:      Appearance: She is ill-appearing.  Cardiovascular:     Rate and Rhythm: Normal rate.     Pulses: Normal pulses.  Pulmonary:     Effort: Pulmonary effort is normal.  Abdominal:     Palpations: Abdomen is soft.  Musculoskeletal:     Comments: No purposeful movement of any extremities  Skin:    General: Skin is warm.  Neurological:     Comments: Minimally interactive, intermittent tracking with eyes, nonverbal            Vital Signs: BP (!) 113/95   Pulse 75   Temp 98 F (36.7 C) (Axillary)   Resp 14   Ht 5\' 2"  (1.575 m)   Wt 91.2 kg   SpO2 99%   BMI 36.77 kg/m  SpO2: SpO2: 99 % O2 Device: O2 Device: Nasal Cannula O2 Flow Rate: O2 Flow Rate (L/min): 2 L/min      Palliative Assessment/Data: 30%    Flowsheet Rows    Flowsheet Row Most Recent Value  Intake Tab   Referral Department Hospitalist  Unit at Time of Referral ICU  Palliative Care Primary Diagnosis Neurology  Date Notified 05/24/21  Palliative Care Type New Palliative care  Reason for referral Clarify Goals of Care  Date of Admission 05/21/21  Date first seen by Palliative Care 05/24/21  # of days Palliative referral response time 0 Day(s)  # of days IP prior to Palliative referral 3  Clinical Assessment   Palliative Performance Scale Score 20%  Pain Max last 24 hours Not able to report  Pain Min Last 24  hours Not able to report  Dyspnea Max Last 24 Hours Not able to report  Dyspnea Min Last 24 hours Not able to report  Psychosocial & Spiritual Assessment   Palliative Care Outcomes         Total Time 25 minutes Prolonged Time Billed  no   Greater than 50%  of this time was spent counseling and coordinating care related to the above assessment and plan.  Thank you for allowing the Palliative Medicine Team to assist in the care of this patient.  [place] Ilsa Iha, FNP-BC Palliative Medicine Team Team Phone # [phone]

## 2021-05-31 NOTE — Plan of Care (Signed)
  Problem: Education: Goal: Knowledge of General Education information will improve Description: Including pain rating scale, medication(s)/side effects and non-pharmacologic comfort measures Outcome: Not Progressing   Problem: Health Behavior/Discharge Planning: Goal: Ability to manage health-related needs will improve Outcome: Not Progressing   Problem: Clinical Measurements: Goal: Ability to maintain clinical measurements within normal limits will improve Outcome: Not Progressing Goal: Will remain free from infection Outcome: Not Progressing Goal: Diagnostic test results will improve Outcome: Not Progressing Goal: Respiratory complications will improve Outcome: Not Progressing Goal: Cardiovascular complication will be avoided Outcome: Not Progressing   Problem: Activity: Goal: Risk for activity intolerance will decrease Outcome: Not Progressing   Problem: Nutrition: Goal: Adequate nutrition will be maintained Outcome: Not Progressing   Problem: Coping: Goal: Level of anxiety will decrease Outcome: Not Progressing   Problem: Elimination: Goal: Will not experience complications related to bowel motility Outcome: Not Progressing Goal: Will not experience complications related to urinary retention Outcome: Not Progressing   Problem: Pain Managment: Goal: General experience of comfort will improve Outcome: Not Progressing   Problem: Safety: Goal: Ability to remain free from injury will improve Outcome: Not Progressing   Problem: Skin Integrity: Goal: Risk for impaired skin integrity will decrease Outcome: Not Progressing  Patient alert to self patient unable to make all needs known pt needs complete ADL care on TF failed swallow FMS for diarrhea foley in place for wounds.

## 2021-05-31 NOTE — TOC Progression Note (Signed)
Transition of Care [facility]) - Progression Note    Patient Details  Name: Desiree Mitchell MRN: [id number] Date of Birth: 1956-01-28  [facility] CM/SW [place], RN Phone Number: 05/31/2021, 2:48 PM  Clinical Narrative:  Attending request SNF/Rehab placement for discharge. Called and spoke with Daughter [name] who confirms recommendation and prefers a facility in [place]. Will wait for PT/OT evaluation and start the process. Attending notified to place order.     Expected Discharge Plan: Long Term Acute Care (LTAC) Barriers to Discharge: Continued Medical Work up  Expected Discharge Plan and Services Expected Discharge Plan: Long Term Acute Care (LTAC) In-house Referral: Clinical Social Work   Post Acute Care Choice: Long Term Acute Care (LTAC) Living arrangements for the past 2 months: Single Family Home                                       Social Determinants of Health (SDOH) Interventions    Readmission Risk Interventions No flowsheet data found.

## 2021-05-31 NOTE — Progress Notes (Signed)
Clinical/Bedside Swallow Evaluation Patient Details  Name: Desiree Mitchell MRN: [id number] Date of Birth: 1955-09-18  Today's Date: 05/31/2021 Time: SLP Start Time (ACUTE ONLY): 0955 SLP Stop Time (ACUTE ONLY): 1020 SLP Time Calculation (min) (ACUTE ONLY): 25 min  Past Medical History: History reviewed. No pertinent past medical history. Past Surgical History:  Past Surgical History:  Procedure Laterality Date   SHUNT REMOVAL Left 05/21/2021   Procedure: SHUNT REMOVAL;  Surgeon: Meade Maw, MD;  Location: [facility] ORS;  Service: Neurosurgery;  Laterality: Left;   TEE WITHOUT CARDIOVERSION N/A 05/29/2021   Procedure: TRANSESOPHAGEAL ECHOCARDIOGRAM (TEE);  Surgeon: Corey Skains, MD;  Location: [facility] ORS;  Service: Cardiovascular;  Laterality: N/A;   VENTRICULOSTOMY Left 05/21/2021   Procedure: VENTRICULOSTOMY;  Surgeon: Meade Maw, MD;  Location: [facility] ORS;  Service: Neurosurgery;  Laterality: Left;   HPI:  65 year old female presented at [facility] ED on 05/21/2021. PMHx of subarachnoid hemorrhage s/p drain placement (approximately 2007), breast cancer (port a cath in place), obesity (BMI 34.48) presented on 10/10 with altered mental status found unresponsive by family. Upon arrival imaging completed, including, "CXR 05/21/2021: Mild left basilar atelectasis without evidence of an acute infiltrate  CT head without contrast 05/21/2021: Interval development of mild hydrocephalus and periventricular edema, associated malpositioned left VA shunt that terminates in the anterior mediastinum and no longer within the left brachiocephalic vein.  Shunt noted to be coiled within the neck.  Groundglass opacity within the right lower lobe and patchy airspace opacity within the left lower lobe." She has a history of prior subarachnoid hemorrhage requiring shunt placement. Resulting in neurosurgery consult. CT head showed increased ventricular size concerning for shunt malfunction. She underwent removal  of shunt and placement of EVD on 05/21/21.    Assessment / Plan / Recommendation  Clinical Impression  Pt presents wtih suspected significant oropharyngeal dysphagia, leading to high risk of aspiration. Upon therapist approach, pt currently on 2L O2, resting at 100% saturations with HR ranging 19-24. Pt with hoarse, low intensity vocal quality, reporting "soreness" in throat to speak or attempt higher volume. Pt with wet, immediate cough for 3/3 ice trials; with verbal cues provided to maintain attention to task and complete trial. No change in vital signs noted; however, wet vocal quality persisted with completion of coughing. Oral phase significant for slowed transit and minimal manipulation for ice chip, with verbal cues aiding awareness of bolus in oral cavity. Given consistent s/sx of aspiration and high risk for aspiraiton, no furhter trials completed this date. Recommend continued NPO status with frequent oral care. SLP will follow up to determine readiness for initiation in PO diet. Given overall reduced cognitive engagement, generalized oral weakness, high risk for aspiration, and fluctuation in alertness, concern for pt's ability to meet nutritional needs. RN aware of recommendations.  SLP Visit Diagnosis: Dysphagia, oropharyngeal phase (R13.12)    Aspiration Risk  Severe aspiration risk    Diet Recommendation   Recommend continued NPO status with frequent oral care.   Medication Administration: Via alternative means    Other  Recommendations Oral Care Recommendations: Oral care BID Other Recommendations: Have oral suction available    Recommendations for follow up therapy are one component of a multi-disciplinary discharge planning process, led by the attending physician.  Recommendations may be updated based on patient status, additional functional criteria and insurance authorization.  Follow up Recommendations  (TBD)      Frequency and Duration min 2x/week  2 weeks        Prognosis Prognosis for Safe  Diet Advancement: Fair Barriers to Reach Goals: Cognitive deficits;Severity of deficits      Swallow Study   General Date of Onset: 05/22/21 HPI: 65 year old female presented at [facility] ED on 05/21/2021. PMHx of subarachnoid hemorrhage s/p drain placement (approximately 2007), breast cancer (port a cath in place), obesity (BMI 34.48) presented on 10/10 with altered mental status found unresponsive by family. Upon arrival imaging completed, including, "CXR 05/21/2021: Mild left basilar atelectasis without evidence of an acute infiltrate  CT head without contrast 05/21/2021: Interval development of mild hydrocephalus and periventricular edema, associated malpositioned left VA shunt that terminates in the anterior mediastinum and no longer within the left brachiocephalic vein.  Shunt noted to be coiled within the neck.  Groundglass opacity within the right lower lobe and patchy airspace opacity within the left lower lobe." She has a history of prior subarachnoid hemorrhage requiring shunt placement. Resulting in neurosurgery consult. CT head showed increased ventricular size concerning for shunt malfunction. She underwent removal of shunt and placement of EVD on 05/21/21. Type of Study: Bedside Swallow Evaluation Previous Swallow Assessment: n/a Diet Prior to this Study: NPO Temperature Spikes Noted: No (WBC 14.0 trending up) Respiratory Status: Nasal cannula (2 Liters) History of Recent Intubation: Yes (for procedure) Length of Intubations (days): 1 days (for procedure) Date extubated: 05/22/21 Behavior/Cognition: Lethargic/Drowsy;Distractible;Requires cueing;Cooperative Oral Cavity Assessment: Dry Oral Care Completed by SLP: Yes Oral Cavity - Dentition: Adequate natural dentition Vision:  (not assessed) Self-Feeding Abilities: Needs assist Patient Positioning: Upright in bed Baseline Vocal Quality: Hoarse;Low vocal intensity Volitional Cough: Weak Volitional  Swallow: Unable to elicit    Oral/Motor/Sensory Function Overall Oral Motor/Sensory Function: Generalized oral weakness Lingual ROM:  (generalized weakness) Mandible:  (generailized weakness)   Ice Chips Ice chips: Impaired Presentation: Spoon Oral Phase Impairments: Reduced lingual movement/coordination Oral Phase Functional Implications: Prolonged oral transit Pharyngeal Phase Impairments: Cough - Immediate (3/3 trials)   Thin Liquid Thin Liquid: Not tested    Nectar Thick Nectar Thick Liquid: Not tested   Honey Thick Honey Thick Liquid: Not tested   Puree Puree: Not tested   Solid     Solid: Not tested      [place] C Mikolaj Woolstenhulme 05/31/2021,11:24 AM

## 2021-05-31 NOTE — Progress Notes (Signed)
Attending Progress Note  History: Desiree Mitchell presenting on 05/21/21 with after being found unresponsive by family. She has a history of prior subarachnoid hemorrhage requiring shunt placement. Resulting in neurosurgery consult. CT head showed increased ventricular size concerning for shunt malfunction.  She underwent removal of shunt and placement of EVD on 05/21/21.   POD#10: continues to be neurologically stable. Getting breathing treatment this morning.   POD#9: CT head stable this morning. EVD removed.  POD#8 Patient went for echo today given concern for emboli. EVD remained clamp and patient appears to be at baseline neurologically  POD#7 CT stable after EVD clamped for 24 hours. Sleepy but arousable.   POD#5: Stable CSF output, Patient improved alertness without headache. Increasing level of EVD to 25.  POD#4: MRI showing some watershed type infarcts  POD#3: NAEO  POD#2: patient extubated yesterday but increased O2 requirement overnight   POD#1: Currently off all sedation  Physical Exam: Vitals:   05/31/21 0600 05/31/21 0700  BP: (!) 152/86 (!) 149/87  Pulse: 95 92  Resp: 15 17  Temp:    SpO2: 96% 98%    PERRL Oriented to self. Continues to follow commands R side more briskly than left Incisions and EVD site c/d/I without signs of drainage  Data:  Recent Labs  Lab 05/29/21 0449 05/30/21 0442 05/31/21 0338  NA 136 136 137  K 4.4 4.3 4.5  CL 104 102 102  CO2 29 27 27   BUN 18 20 19   CREATININE 0.84 0.79 0.79  GLUCOSE 107* 138* 139*  CALCIUM 7.5* 7.9* 8.0*    Recent Labs  Lab 05/25/21 0517  AST 47*  ALT 41  ALKPHOS 58      Recent Labs  Lab 05/27/21 0541 05/28/21 0458 05/29/21 0449  WBC 12.9* 12.8* 14.0*  HGB 11.6* 11.6* 11.3*  HCT 35.2* 36.5 34.0*  PLT 314 317 322    No results for input(s): APTT, INR in the last 168 hours.       Other tests/results:  CT head  IMPRESSION: 1. Interval development of mild hydrocephalus and  periventricular edema. 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 The shunt is noted to be coiled within the neck. Recommend neurosurgery consultation. 2. No acute displaced fracture or traumatic listhesis of the cervical spine. 3. Ground-glass airspace opacity within the right lower lobe and patchy airspace opacity within left lower lobe. Findings may represent a combination of infection/inflammation as well as atelectasis. 4. No acute traumatic injury to the chest, abdomen, or pelvis with limited evaluation on this noncontrast study.   5. No acute fracture or traumatic malalignment of the thoracic or lumbar spine. 6.  Aortic Atherosclerosis (ICD10-I70.0). 7. Interval development of an enlarged right thyroid gland with limited evaluation due to streak artifact. Recommend thyroid ultrasound (ref: J Am Coll Radiol. 2015 Feb;12(2): 143-50).   These results were called by telephone at the time of interpretation on 05/21/2021 at 9:48 pm to provider [facility] , who verbally acknowledged these results.   Electronically Signed   By: Iven Finn M.D.   On: 05/21/2021 21:54   Narrative & Impression  CLINICAL DATA:  Neuro deficit, acute, stroke suspected   EXAM: MRI HEAD WITHOUT CONTRAST   TECHNIQUE: Multiplanar, multiecho pulse sequences of the brain and surrounding structures were obtained without intravenous contrast.   COMPARISON:  Correlation made with prior CTs   FINDINGS: Brain: There are a  few scattered small foci of reduced diffusion including involvement of the right basal ganglia and corona radiata, left basal ganglia, right inferior parietal cortex, parasagittal left frontal cortex, and anterior right cerebellum.   Left posterior approach ventricular catheter extends to the right of midline traversing the third ventricle with tip  extending laterally between frontal horn and third ventricle. Prior right frontal catheter tract. Ventricles are similar in size to recent CT imaging.   Right anterior temporal encephalomalacia. 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 Chronic infarcts and/or perivascular spaces along the central cerebral white matter and deep gray nuclei. Patchy and confluent areas of T2 hyperintensity in the supratentorial white matter may reflect chronic microvascular ischemic changes. Scattered foci of susceptibility likely reflecting chronic microhemorrhages due to hypertension.   No intracranial mass or mass effect.   Vascular: Major vessel flow voids at the sk few scattered small acute ull base are preserved.   Skull and upper cervical spine: Normal marrow signal is preserved.   Sinuses/Orbits: Paranasal sinuses are aerated. Orbits are unremarkable.   Other: Sella is unremarkable.  Mastoid air cells are clear.   IMPRESSION: Few scattered small acute infarcts involving different vascular territories.   Ventricular catheter present with similar caliber of ventricles as compared to recent CT.   Chronic microvascular ischemic changes, chronic infarcts, and chronic microhemorrhages.     Electronically Signed   By: Macy Mis M.D.   On: 05/25/2021 15:31    Assessment/Plan:  Desiree Mitchell is a 65 y.o presenting to the ER after being found down by her family on 05/21/21. She was found to be septic and head CT was concerning for shunt malfunction.  She underwent removal of VA shunt on 05/21/2021 and placement of EVD.  - EVD removed on 05/30/21 Can resume DVT ppx 10/20 from neurosurgical standpoint - MRI demonstrated multifocal infarcts, please consult neurology for further management.  - no plan for further neurosurgical intervention at this time. Will continue to monitor  - remainder of management per recommendations of critical care.  Cooper Render  PA-C Department of Neurosurgery

## 2021-06-01 DIAGNOSIS — N179 Acute kidney failure, unspecified: Secondary | ICD-10-CM | POA: Diagnosis not present

## 2021-06-01 DIAGNOSIS — Z515 Encounter for palliative care: Secondary | ICD-10-CM | POA: Diagnosis not present

## 2021-06-01 DIAGNOSIS — J9601 Acute respiratory failure with hypoxia: Secondary | ICD-10-CM | POA: Diagnosis not present

## 2021-06-01 DIAGNOSIS — A419 Sepsis, unspecified organism: Secondary | ICD-10-CM | POA: Diagnosis not present

## 2021-06-01 LAB — BASIC METABOLIC PANEL
Anion gap: 8 (ref 5–15)
BUN: 20 mg/dL (ref 8–23)
CO2: 29 mmol/L (ref 22–32)
Calcium: 8.2 mg/dL — ABNORMAL LOW (ref 8.9–10.3)
Chloride: 102 mmol/L (ref 98–111)
Creatinine, Ser: 0.75 mg/dL (ref 0.44–1.00)
GFR, Estimated: >60 mL/min (ref >60)
Glucose, Bld: 128 mg/dL — ABNORMAL HIGH (ref 70–99)
Potassium: 4.7 mmol/L (ref 3.5–5.1)
Sodium: 139 mmol/L (ref 135–145)

## 2021-06-01 LAB — GLUCOSE, CAPILLARY
Glucose-Capillary: 101 mg/dL — ABNORMAL HIGH (ref 70–99)
Glucose-Capillary: 101 mg/dL — ABNORMAL HIGH (ref 70–99)
Glucose-Capillary: 118 mg/dL — ABNORMAL HIGH (ref 70–99)
Glucose-Capillary: 125 mg/dL — ABNORMAL HIGH (ref 70–99)
Glucose-Capillary: 128 mg/dL — ABNORMAL HIGH (ref 70–99)
Glucose-Capillary: 153 mg/dL — ABNORMAL HIGH (ref 70–99)

## 2021-06-01 NOTE — Progress Notes (Signed)
[facility] NAME: Desiree Mitchell    MR#:  [id number]  DATE OF BIRTH:  [birth year]  SUBJECTIVE:  patient more awake today. She leans her head towards the right. Able to move her right upper lower extremity and left lower extremity. Very weak on left upper extremity. Awake but not much interactive today TF going on well REVIEW OF SYSTEMS:   Review of Systems  Unable to perform ROS: Medical condition  Tolerating Diet:no--NG tube Tolerating PT: pending  DRUG ALLERGIES:  No Known Allergies  VITALS:  Blood pressure (!) 149/82, pulse 88, temperature 99.1 F (37.3 C), resp. rate 20, height 5\' 2"  (1.575 m), weight 93 kg, SpO2 100 %.  PHYSICAL EXAMINATION:   Physical Exam  GENERAL:  65 y.o.-year-old patient lying in the bed with no acute distress. obese HEENT: Head atraumatic, normocephalic. Oropharynx and nasopharynx clear. NG+ LUNGS: Normal breath sounds bilaterally, no wheezing, rales, rhonchi. No use of accessory muscles of respiration.  CARDIOVASCULAR: S1, S2 normal. No murmurs, rubs, or gallops.  ABDOMEN: Soft, nontender, nondistended. Bowel sounds present.  EXTREMITIES: No cyanosis, clubbing or edema b/l.    NEUROLOGIC: left hemiparesis PSYCHIATRIC:  patient is alert on VC SKIN:  Pressure Injury 05/22/21 Buttocks Right Deep Tissue Pressure Injury - Purple or maroon localized area of discolored intact skin or blood-filled blister due to damage of underlying soft tissue from pressure and/or shear. (Active)  05/22/21 0300  Location: Buttocks  Location Orientation: Right  Staging: Deep Tissue Pressure Injury - Purple or maroon localized area of discolored intact skin or blood-filled blister due to damage of underlying soft tissue from pressure and/or shear.  Wound Description (Comments):   Present on Admission: Yes     Pressure Injury 05/22/21 Coccyx Stage 2 -  Partial thickness loss of dermis presenting as a shallow open injury  with a red, pink wound bed without slough. (Active)  05/22/21 0300  Location: Coccyx  Location Orientation:   Staging: Stage 2 -  Partial thickness loss of dermis presenting as a shallow open injury with a red, pink wound bed without slough.  Wound Description (Comments):   Present on Admission: Yes     Pressure Injury 05/22/21 Cervical Deep Tissue Pressure Injury - Purple or maroon localized area of discolored intact skin or blood-filled blister due to damage of underlying soft tissue from pressure and/or shear. (Active)  05/22/21 0300  Location: Cervical  Location Orientation:   Staging: Deep Tissue Pressure Injury - Purple or maroon localized area of discolored intact skin or blood-filled blister due to damage of underlying soft tissue from pressure and/or shear.  Wound Description (Comments):   Present on Admission: Yes        LABORATORY PANEL:  CBC Recent Labs  Lab 05/29/21 0449  WBC 14.0*  HGB 11.3*  HCT 34.0*  PLT 322     Chemistries  Recent Labs  Lab 05/31/21 0338 06/01/21 0443  NA 137 139  K 4.5 4.7  CL 102 102  CO2 27 29  GLUCOSE 139* 128*  BUN 19 20  CREATININE 0.79 0.75  CALCIUM 8.0* 8.2*  MG 2.2  --     Cardiac Enzymes No results for input(s): TROPONINI in the last 168 hours. RADIOLOGY:  No results found. ASSESSMENT AND PLAN:   Puja Caffey is a 65 y.o. 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 She had extensive skin  bruising and also a wound on post neck where her head had rested against a basket. Also has multiple skin lesions and was apparently infested with bedbugs and cockroaches on admission.  Severe sepsis without shock due to bacteremia from her skin on legs as well as posterior neck present on admission -- ID consult appreciated. Will need linezolid for 14 days for gram-positive coverage given soft tissue infection -- continue IV Rocephin for Proteus infection  in the blood for 10 days (10/10) -- Dr. Ola Spurr recommends continue monitor signs of infection after stopping antibiotics -- unable to do TEE secondary to esophageal stricture--- low risk for endocarditis per ID rec assessment-- continue above antibiotics  VA shunt malfunction status post EVD placement on 10/11 by Dr. Lacinda Axon neurosurgery -- patient improving neurology currently. Repeat CT head stable -- removed 10/19 at bedside per Dr. Lacinda Axon -- CSF cultures negative --ok to start DVT proph  posterior neck, buttock, coccyx pressure injuries secondary to being found down at home for unknown amount of time -- continue wound care per wound consult  rhabdomyolysis secondary to hypovolemia in the setting of dehydration -- present on admission now improving  acute respiratory failure with hypoxia  in the postoperative setting after neurological be a shunt requiring mechanical ventilator -- now extubated -- sats hundred percent on 2 L--wean to RA  multifocal embolic infarcts in the setting of sepsis and possible shunt malfunction positive blood culture with embolic infarcts raise possibility of endocarditis but no obvious vegetations were seen onTTE and patient not able to tolerate TEE due to esophageal stricture --- start daily aspirin -- speech therapy to follow -- once able to tolerate overall DC to feeding -- wean to room air  Dyphagia/Nutrition -- patient evaluated by speech therapy--- progression. Recommends continue NG tube feeding. Speech will follow on daily basis. Discussed with daughter that if patient continues to show signs of aspiration will need to consider other option for feeding  like PEG -- continue NG tube feeding for now--will consult GI for PEG placement   Procedures:EVD shunt removal Family communication :[name] on the phone 10/20 Consults :ID,cards,neurosurgery CODE STATUS: FULL DVT Prophylaxis :SCD Level of care: Med-Surg Status is: Inpatient  Remains inpatient  appropriate because: multiple comorbidities and slow improvement of above mentioned issues  TOTAL TIME TAKING CARE OF THIS PATIENT: 25 minutes.  >50% time spent on counselling and coordination of care  Note: This dictation was prepared with Dragon dictation along with smaller phrase technology. Any transcriptional errors that result from this process are unintentional.  Fritzi Mandes M.D    [facility]   CC: Primary care physician; Pcp, No Patient ID: Amybeth Sieg, female   DOB: 03-17-1956, 65 y.o.   MRN: [id number]

## 2021-06-01 NOTE — Evaluation (Signed)
Physical Therapy Evaluation Patient Details Name: Desiree Mitchell MRN: [id number] DOB: 02-09-1956 Today's Date: 06/01/2021  History of Present Illness  Pt is a 65 y.o. female presenting to hospital 05/21/21 with AMS (found unresponsive by family).  Significant pressure sore present back of neck, buttocks, and coccyx.  Pt noted to be infested with bed bugs and roaches.  Pt admitted with acute respiratory failure, rhabdomyolysis, metabolic acidosis, acute kidney injury, sepsis, VA shunt malfunction, transaminitis, and posterior neck/buttocks/coccyx pressure injuries.  S/p removal ventriculoatrial shunt and placement of L parietal approach ventriculostomy 05/22/21; extubated 10/11; MRI brain 10/14 showing few scattered small acute infarcts involving different vascular territories (watershed type infarcts); TEE 10/18; and EVD removed 10/19.    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  Clinical Impression  Pt awake upon PT arrival (resting in bed) but inconsistent with ability to respond to questions/cueing (sometimes pt did not respond; sometimes pt required extra time to respond; sometimes pt appearing confused based on answer that was max 2-3 words at a time).  When asked if pt had any pain pt reported she had "hunger pain".  Able to move R UE with cueing and assist but very limited L UE movement noted (per chart pt with baseline L sided residual weakness from h/o CVA).  B LE weakness noted (pt had more difficulty following cues for B LE's to assess strength limiting assessment); impaired B DF ROM noted.  Prior to hospital admission, per chart pt lives alone.  Currently pt is 2 assist with bed mobility; (in sitting edge of bed) max assist x2 to laterally scoot towards R along bed using bed sheet; and mod to max assist (d/t posterior lean) to sit on edge of bed for about 10 minutes.  Pt able to hold head up briefly when cued but fatigued  and unable to maintain position.  Pt would benefit from skilled PT to address noted impairments and functional limitations (see below for any additional details).  Upon hospital discharge, pt would benefit from SNF.       Recommendations for follow up therapy are one component of a multi-disciplinary discharge planning process, led by the attending physician.  Recommendations may be updated based on patient status, additional functional criteria and insurance authorization.  Follow Up Recommendations SNF    Equipment Recommendations  Other (comment) (TBD at next facility)    Recommendations for Other Services OT consult     Precautions / Restrictions Precautions Precautions: Fall Precaution Comments: Aspiration; gastric tube; rectal tube; R chest port; posterior neck, buttocks, and coccyx pressure injuries Restrictions Weight Bearing Restrictions: No Other Position/Activity Restrictions: Per MD Posey Pronto 06/01/21 pt cleared for therapy and OOB mobility (not on bedrest anymore)      Mobility  Bed Mobility Overal bed mobility: Needs Assistance Bed Mobility: Supine to Sit;Sit to Supine     Supine to sit: Max assist;Mod assist;+2 for physical assistance;HOB elevated Sit to supine: Mod assist;Max assist;+2 for physical assistance;HOB elevated   General bed mobility comments: assist for trunk and B LE's; vc's for technique; use of bed sheet to assist    Transfers Overall transfer level: Needs assistance Equipment used: None Transfers: Lateral/Scoot Transfers          Lateral/Scoot Transfers: Max assist;+2 physical assistance General transfer comment: laterally scooting towards R along bed x3 trials  Ambulation/Gait             General Gait Details: not appropriate at  this time  Financial trader Rankin (Stroke Patients Only)       Balance Overall balance assessment: Needs assistance Sitting-balance support: Bilateral upper  extremity supported;Feet supported Sitting balance-Leahy Scale: Poor Sitting balance - Comments: posterior lean noted requiring mod to max assist for sitting balance                                     Pertinent Vitals/Pain Pain Assessment: Faces Faces Pain Scale: No hurt Pain Intervention(s): Limited activity within patient's tolerance;Monitored during session;Repositioned Vitals (HR and O2 on room air) stable and WFL throughout treatment session.    Home Living Family/patient expects to be discharged to:: Private residence Living Arrangements: Alone   Type of Home: House       Home Layout: Two level   Additional Comments: Pt able to provide minimal home set- up information    Prior Function           Comments: Pt unable to verbalize prior functional status.     Hand Dominance        Extremity/Trunk Assessment   Upper Extremity Assessment Upper Extremity Assessment: Difficult to assess due to impaired cognition (Fair to good B hand grip strength; at least 3/5 AROM R elbow flexion/extension; unable to perform R UE shoulder flexion on own; minimal activation noted L elbow flexion/extension and shoulder flexion.)    Lower Extremity Assessment Lower Extremity Assessment: Difficult to assess due to impaired cognition;RLE deficits/detail;LLE deficits/detail (B DF grossly 10 degrees short of neutral) RLE Deficits / Details: able to wiggle toes but unable to perform AROM ankle DF/PF with cueing LLE Deficits / Details: able to wiggle toes and perform partial AROM ankle DF/PF with cueing    Cervical / Trunk Assessment Cervical / Trunk Assessment: Other exceptions Cervical / Trunk Exceptions: forward head/shoulders; difficulty holding head up in sitting consistently (able to briefly with cueing)  Communication   Communication: Expressive difficulties (Inconsistent with answering questions)  Cognition Arousal/Alertness: Awake/alert Behavior During Therapy:  Flat affect Overall Cognitive Status: No family/caregiver present to determine baseline cognitive functioning                                 General Comments: Pt unable to verbalize name or DOB but did nod her head that she likes to be called Desiree Mitchell.      General Comments General comments (skin integrity, edema, etc.): abrasions noted B LE's and L hand.  Nursing cleared pt for participation in physical therapy.  Pt agreeable to PT session.    Exercises  Sitting balance and transfer training   Assessment/Plan    PT Assessment Patient needs continued PT services  PT Problem List Decreased strength;Decreased range of motion;Decreased activity tolerance;Decreased balance;Decreased mobility;Decreased cognition;Decreased knowledge of use of DME;Decreased safety awareness;Decreased knowledge of precautions       PT Treatment Interventions DME instruction;Gait training;Functional mobility training;Therapeutic activities;Therapeutic exercise;Balance training;Patient/family education    PT Goals (Current goals can be found in the Care Plan section)  Acute Rehab PT Goals Patient Stated Goal: pt did not state any goals PT Goal Formulation: Patient unable to participate in goal setting Time For Goal Achievement: 06/15/21 Potential to Achieve Goals: Fair    Frequency Min 2X/week   Barriers to discharge Decreased caregiver support  Co-evaluation               AM-PAC PT "6 Clicks" Mobility  Outcome Measure Help needed turning from your back to your side while in a flat bed without using bedrails?: Total Help needed moving from lying on your back to sitting on the side of a flat bed without using bedrails?: Total Help needed moving to and from a bed to a chair (including a wheelchair)?: Total Help needed standing up from a chair using your arms (e.g., wheelchair or bedside chair)?: Total Help needed to walk in hospital room?: Total Help needed climbing 3-5 steps  with a railing? : Total 6 Click Score: 6    End of Session Equipment Utilized During Treatment: Oxygen Activity Tolerance: Patient limited by fatigue Patient left: in bed;with call bell/phone within reach;with bed alarm set Nurse Communication: Mobility status;Precautions PT Visit Diagnosis: Other abnormalities of gait and mobility (R26.89);Muscle weakness (generalized) (M62.81);Difficulty in walking, not elsewhere classified (R26.2)    Time: 6153-7943 PT Time Calculation (min) (ACUTE ONLY): 49 min   Charges:   PT Evaluation $PT Eval Low Complexity: 1 Low PT Treatments $Therapeutic Activity: 23-37 mins       Leitha Bleak, PT 06/01/21, 6:37 PM

## 2021-06-01 NOTE — TOC Progression Note (Signed)
Transition of Care [facility]) - Progression Note    Patient Details  Name: Desiree Mitchell MRN: [id number] Date of Birth: 1955-11-17  [facility] CM/SW [place], RN Phone Number: 06/01/2021, 4:20 PM  Clinical Narrative:  Spoke with daughter about patients ss#, she did know last 4 digits, [phone], she advised me to look unnder maiden name, Desiree Mitchell however unable to find ss#. Daughter states she will try to find ss card when she get off today after 5pm. Spoke with patient, she did give me some numbers however the last 4 digits did not match daughters numbers. Will wait for daughter return call to implement the SNF paperwork.     Expected Discharge Plan: Long Term Acute Care (LTAC) Barriers to Discharge: Continued Medical Work up  Expected Discharge Plan and Services Expected Discharge Plan: Long Term Acute Care (LTAC) In-house Referral: Clinical Social Work   Post Acute Care Choice: Long Term Acute Care (LTAC) Living arrangements for the past 2 months: Single Family Home                                       Social Determinants of Health (SDOH) Interventions    Readmission Risk Interventions No flowsheet data found.

## 2021-06-01 NOTE — Progress Notes (Signed)
0800 patient alert not speaking today following commands. Patient unable to make needs known patient keeps touching Girard tube with right hand constant redirection needed

## 2021-06-01 NOTE — Evaluation (Signed)
Occupational Therapy Evaluation Patient Details Name: Desiree Mitchell MRN: [id number] DOB: 1955/11/10 Today's Date: 06/01/2021   History of Present Illness Pt is a 65 y.o. female presenting to hospital 05/21/21 with AMS (found unresponsive by family).  Significant pressure sore present back of neck, buttocks, and coccyx.  Pt noted to be infested with bed bugs and roaches.  Pt admitted with acute respiratory failure, rhabdomyolysis, metabolic acidosis, acute kidney injury, sepsis, VA shunt malfunction, transaminitis, and posterior neck/buttocks/coccyx pressure injuries.  S/p removal ventriculoatrial shunt and placement of L parietal approach ventriculostomy 05/22/21; extubated 10/11; MRI brain 10/14 showing few scattered small acute infarcts involving different vascular territories (watershed type infarcts); TEE 10/18; and EVD removed 10/19.    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   Clinical Impression   Pt seen for OT evaluation this date in setting of acute hospitalization d/t sepsis after being found down with LKW 2 days prior. Pt is somewhat confused and poor historian this date. She reports being INDEP and living alone at baseline, states her daughter, [name], lives right next door. She presents this date with significant deconditioning, weakness and decreased activity tolerance, superimposed on baseline L side residual weakness from h/o CVA. She currently requires: MIN/MOD A for seated UB ADLs (bed level, high fowler's) and requires MAX/TOTAL A for LB ADLs bed level. MAX +2 for rolling, TOTAL A with HOB elevated to come to sitting and cues to attend to task throughout 2/2 decreased cognitive status. Pt with significant weakness, but does show good effort to participate. Will continue to follow acutely. Anticipate pt will require f/u OT services in SNF setting upon d/c.      Recommendations for follow up therapy are one  component of a multi-disciplinary discharge planning process, led by the attending physician.  Recommendations may be updated based on patient status, additional functional criteria and insurance authorization.   Follow Up Recommendations  SNF    Equipment Recommendations  Other (comment) (TBD once able to mobilize more, likely defer to next setting)    Recommendations for Other Services       Precautions / Restrictions Precautions Precautions: Fall Precaution Comments: Aspiration; gastric tube; rectal tube; R chest port; posterior neck, buttocks, and coccyx pressure injuries Restrictions Weight Bearing Restrictions: No Other Position/Activity Restrictions: Per MD Posey Pronto 06/01/21 pt cleared for therapy and OOB mobility (not on bedrest anymore)      Mobility Bed Mobility Overal bed mobility: Needs Assistance Bed Mobility: Supine to Sit;Sit to Supine     Supine to sit: Max assist;Total assist;HOB elevated Sit to supine: Max assist;Total assist;HOB elevated   General bed mobility comments: requries MAX A +2 to propel towards HOB, MAX/TOTAL A with HOB elevated to ~55 degrees and moderate tactile/verbal cues    Transfers Overall transfer level: Needs assistance Equipment used: None Transfers: Lateral/Scoot Transfers          Lateral/Scoot Transfers: Max assist;+2 physical assistance General transfer comment: unable on OT assessment, requires 2p assist likely    Balance Overall balance assessment: Needs assistance Sitting-balance support: Bilateral upper extremity supported;Feet supported Sitting balance-Leahy Scale: Poor Sitting balance - Comments: requires MOD A to sustain static sit, one ~3-4 second bout sustaining static sit when prompted, but largely too weak to sustain       Standing balance comment: deferred  ADL either performed or assessed with clinical judgement   ADL                                          General ADL Comments: pt requires MIN/MOD A for seated UB ADLs (bed level, high fowler's) and requires MAX/TOTAL A for LB ADLs bed level. MAX +2 for rolling, TOTAL A with HOB elevated to come to sitting     Vision   Additional Comments: unable to formally assess, poor visual attn 2/2 cognition     Perception     Praxis      Pertinent Vitals/Pain Pain Assessment: Faces Faces Pain Scale: No hurt Pain Intervention(s): Limited activity within patient's tolerance;Monitored during session;Repositioned     Hand Dominance     Extremity/Trunk Assessment Upper Extremity Assessment Upper Extremity Assessment: Generalized weakness;Difficult to assess due to impaired cognition (L side weaker than R, but pt is able to activate and reach to ~1/3 range shld flexion with L hand)   Lower Extremity Assessment Lower Extremity Assessment: Generalized weakness;Difficult to assess due to impaired cognition RLE Deficits / Details: able to wiggle toes but unable to perform AROM ankle DF/PF with cueing LLE Deficits / Details: able to wiggle toes and perform partial AROM ankle DF/PF with cueing   Cervical / Trunk Assessment Cervical / Trunk Assessment: Other exceptions Cervical / Trunk Exceptions: forward head/shoulders; difficulty holding head up in sitting consistently (able to briefly with cueing)   Communication Communication Communication: Expressive difficulties (voice is hoarse, also inconsistent attn to task)   Cognition Arousal/Alertness: Awake/alert Behavior During Therapy: Flat affect Overall Cognitive Status: No family/caregiver present to determine baseline cognitive functioning                                 General Comments: Pt is oriented to self and "hospital" only. she is pleasant and does make reasonable attempts to participate, but she is generally confused and with divided attention. she is inconsistent with verablizations   General Comments  abrasions noted B  LE's and L hand    Exercises Other Exercises Other Exercises: OT engages pt in ed re: role of OT, pt somewhat attentive, but with poor carryover, requires ongoing education.   Shoulder Instructions      Home Living Family/patient expects to be discharged to:: Private residence Living Arrangements: Alone Available Help at Discharge: Family;Available PRN/intermittently (reports daughter [name] lives right next door) Type of Home: House       Home Layout: Two level                   Additional Comments: Pt able to provide minimal home set- up information partially d/t confusion/cognition, partially d/t hoarsness of voice      Prior Functioning/Environment Level of Independence: Independent        Comments: reports she is INDEP and lives alone at baseline.        OT Problem List: Decreased strength;Decreased range of motion;Decreased activity tolerance;Impaired balance (sitting and/or standing);Decreased cognition;Decreased safety awareness;Decreased knowledge of use of DME or AE;Cardiopulmonary status limiting activity;Impaired tone;Obesity;Impaired UE functional use;Increased edema      OT Treatment/Interventions: Self-care/ADL training;Therapeutic exercise;Neuromuscular education;DME and/or AE instruction;Manual therapy;Therapeutic activities;Cognitive remediation/compensation;Patient/family education;Balance training    OT Goals(Current goals can be found in the care plan section) Acute Rehab OT Goals Patient  Stated Goal: pt did not state any goals OT Goal Formulation: Patient unable to participate in goal setting Time For Goal Achievement: 06/15/21 Potential to Achieve Goals: Fair ADL Goals Pt Will Perform Eating: with set-up;sitting (supported sitting, chair position) Pt Will Perform Grooming: with set-up;with supervision;sitting (supported sitting, chair position, with <10% cues) Pt Will Perform Upper Body Dressing: with min assist;sitting Additional ADL Goal  #1: Pt will tolerate 5 mins of sustained activity with <10% verbal cues to attend and MIN A.  OT Frequency: Min 2X/week   Barriers to D/C: Decreased caregiver support          Co-evaluation              AM-PAC OT "6 Clicks" Daily Activity     Outcome Measure Help from another person eating meals?: A Little Help from another person taking care of personal grooming?: A Little Help from another person toileting, which includes using toliet, bedpan, or urinal?: Total (FMS/foley) Help from another person bathing (including washing, rinsing, drying)?: A Lot Help from another person to put on and taking off regular upper body clothing?: A Lot Help from another person to put on and taking off regular lower body clothing?: Total 6 Click Score: 12   End of Session Equipment Utilized During Treatment: Oxygen Nurse Communication: Mobility status  Activity Tolerance: Patient tolerated treatment well Patient left: in bed;with call bell/phone within reach;with bed alarm set  OT Visit Diagnosis: Muscle weakness (generalized) (M62.81);Adult, failure to thrive (R62.7);Other symptoms and signs involving cognitive function                Time: 4536-4680 OT Time Calculation (min): 36 min Charges:  OT General Charges $OT Visit: 1 Visit OT Evaluation $OT Eval Moderate Complexity: 1 Mod OT Treatments $Self Care/Home Management : 8-22 mins  Gerrianne Scale, MS, OTR/L ascom [phone] 06/01/21, 5:37 PM

## 2021-06-01 NOTE — Progress Notes (Signed)
Attending Progress Note  History: Desiree Mitchell presenting on 05/21/21 with after being found unresponsive by family. She has a history of prior subarachnoid hemorrhage requiring shunt placement. Resulting in neurosurgery consult. CT head showed increased ventricular size concerning for shunt malfunction.  She underwent removal of shunt and placement of EVD on 05/21/21.   POD#11: NAEO  POD#10: continues to be neurologically stable. Getting breathing treatment this morning.   POD#9: CT head stable this morning. EVD removed.  POD#8 Patient went for echo today given concern for emboli. EVD remained clamp and patient appears to be at baseline neurologically  POD#7 CT stable after EVD clamped for 24 hours. Sleepy but arousable.   POD#5: Stable CSF output, Patient improved alertness without headache. Increasing level of EVD to 25.  POD#4: MRI showing some watershed type infarcts  POD#3: NAEO  POD#2: patient extubated yesterday but increased O2 requirement overnight   POD#1: Currently off all sedation  Physical Exam: Vitals:   06/01/21 0700 06/01/21 0800  BP: 139/69 (!) 151/80  Pulse: 85 92  Resp: (!) 35 18  Temp:  98.8 F (37.1 C)  SpO2: 100% 99%    PERRL Oriented to self. Continues to follow commands R side more briskly than left Incisions and EVD site c/d/I without signs of drainage  Data:  Recent Labs  Lab 05/30/21 0442 05/31/21 0338 06/01/21 0443  NA 136 137 139  K 4.3 4.5 4.7  CL 102 102 102  CO2 27 27 29   BUN 20 19 20   CREATININE 0.79 0.79 0.75  GLUCOSE 138* 139* 128*  CALCIUM 7.9* 8.0* 8.2*    No results for input(s): AST, ALT, ALKPHOS in the last 168 hours.  Invalid input(s): TBILI    Recent Labs  Lab 05/27/21 0541 05/28/21 0458 05/29/21 0449  WBC 12.9* 12.8* 14.0*  HGB 11.6* 11.6* 11.3*  HCT 35.2* 36.5 34.0*  PLT 314 317 322    No results for input(s): APTT, INR in the last 168 hours.       Other tests/results:  CT head  IMPRESSION: 1.  Interval development of mild hydrocephalus and periventricular edema. 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 The shunt is noted to be coiled within the neck. Recommend neurosurgery consultation. 2. No acute displaced fracture or traumatic listhesis of the cervical spine. 3. Ground-glass airspace opacity within the right lower lobe and patchy airspace opacity within left lower lobe. Findings may represent a combination of infection/inflammation as well as atelectasis. 4. No acute traumatic injury to the chest, abdomen, or pelvis with limited evaluation on this noncontrast study.   5. No acute fracture or traumatic malalignment of the thoracic or lumbar spine. 6.  Aortic Atherosclerosis (ICD10-I70.0). 7. Interval development of an enlarged right thyroid gland with limited evaluation due to streak artifact. Recommend thyroid ultrasound (ref: J Am Coll Radiol. 2015 Feb;12(2): 143-50).   These results were called by telephone at the time of interpretation on 05/21/2021 at 9:48 pm to provider [facility] , who verbally acknowledged these results.   Electronically Signed   By: Iven Finn M.D.   On: 05/21/2021 21:54   Narrative & Impression  CLINICAL DATA:  Neuro deficit, acute, stroke suspected   EXAM: MRI HEAD WITHOUT CONTRAST   TECHNIQUE: Multiplanar, multiecho pulse sequences of the brain and surrounding structures were obtained without intravenous contrast.   COMPARISON:  Correlation made with prior CTs  FINDINGS: Brain: There are a few scattered small foci of reduced diffusion including involvement of the right basal ganglia and corona radiata, left basal ganglia, right inferior parietal cortex, parasagittal left frontal cortex, and anterior right cerebellum.   Left posterior approach ventricular catheter extends to the right  of midline traversing the third ventricle with tip extending laterally between frontal horn and third ventricle. Prior right frontal catheter tract. Ventricles are similar in size to recent CT imaging.   Right anterior temporal encephalomalacia. 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 Chronic infarcts and/or perivascular spaces along the central cerebral white matter and deep gray nuclei. Patchy and confluent areas of T2 hyperintensity in the supratentorial white matter may reflect chronic microvascular ischemic changes. Scattered foci of susceptibility likely reflecting chronic microhemorrhages due to hypertension.   No intracranial mass or mass effect.   Vascular: Major vessel flow voids at the sk few scattered small acute ull base are preserved.   Skull and upper cervical spine: Normal marrow signal is preserved.   Sinuses/Orbits: Paranasal sinuses are aerated. Orbits are unremarkable.   Other: Sella is unremarkable.  Mastoid air cells are clear.   IMPRESSION: Few scattered small acute infarcts involving different vascular territories.   Ventricular catheter present with similar caliber of ventricles as compared to recent CT.   Chronic microvascular ischemic changes, chronic infarcts, and chronic microhemorrhages.     Electronically Signed   By: Macy Mis M.D.   On: 05/25/2021 15:31    Assessment/Plan:  Desiree Mitchell is a 65 y.o presenting to the ER after being found down by her family on 05/21/21. She was found to be septic and head CT was concerning for shunt malfunction.  She underwent removal of VA shunt on 05/21/2021 and placement of EVD.  - EVD removed on 05/30/21 Can resume DVT ppx 10/20 from neurosurgical standpoint - MRI demonstrated multifocal infarcts, please consult neurology for further management.  - no plan for further neurosurgical intervention at this time. Will continue to monitor  - staples to be removed on 10/24 -  remainder of management per internal medicine recommendations   Cooper Render PA-C Department of Neurosurgery

## 2021-06-02 ENCOUNTER — Inpatient Hospital Stay: Payer: Medicare (Managed Care)

## 2021-06-02 DIAGNOSIS — N179 Acute kidney failure, unspecified: Secondary | ICD-10-CM | POA: Diagnosis not present

## 2021-06-02 DIAGNOSIS — Z515 Encounter for palliative care: Secondary | ICD-10-CM | POA: Diagnosis not present

## 2021-06-02 DIAGNOSIS — A419 Sepsis, unspecified organism: Secondary | ICD-10-CM | POA: Diagnosis not present

## 2021-06-02 DIAGNOSIS — J9601 Acute respiratory failure with hypoxia: Secondary | ICD-10-CM | POA: Diagnosis not present

## 2021-06-02 LAB — GLUCOSE, CAPILLARY
Glucose-Capillary: 146 mg/dL — ABNORMAL HIGH (ref 70–99)
Glucose-Capillary: 110 mg/dL — ABNORMAL HIGH (ref 70–99)
Glucose-Capillary: 104 mg/dL — ABNORMAL HIGH (ref 70–99)

## 2021-06-02 LAB — BASIC METABOLIC PANEL
Anion gap: 7 (ref 5–15)
BUN: 22 mg/dL (ref 8–23)
CO2: 30 mmol/L (ref 22–32)
Calcium: 8 mg/dL — ABNORMAL LOW (ref 8.9–10.3)
Chloride: 101 mmol/L (ref 98–111)
Creatinine, Ser: 0.75 mg/dL (ref 0.44–1.00)
GFR, Estimated: >60 mL/min (ref >60)
Glucose, Bld: 126 mg/dL — ABNORMAL HIGH (ref 70–99)
Potassium: 4.7 mmol/L (ref 3.5–5.1)
Sodium: 138 mmol/L (ref 135–145)

## 2021-06-02 MED ORDER — SODIUM CHLORIDE 0.9 % IV BOLUS
500.0000 mL | Freq: Once | INTRAVENOUS | Status: AC
  Administered 2021-06-02: 500 mL via INTRAVENOUS

## 2021-06-02 NOTE — Plan of Care (Signed)

## 2021-06-02 NOTE — Progress Notes (Signed)
   06/02/21 1200  Provider Notification  Provider Name/Title Sona Patel,MD  Date Provider Notified 06/02/21  Time Provider Notified 1200  Notification Type Page  Notification Reason Other (Comment) (vaginal bleeding)  Provider response En route  Date of Provider Response 06/02/21  Time of Provider Response 32  Met Dr Posey Pronto in the room and showed the wipes with blood on them.  MD ordering vaginal U/S and consulting GI.

## 2021-06-02 NOTE — Progress Notes (Signed)
[employer] at [place] NAME: Desiree Mitchell    MR#:  [id number]  DATE OF BIRTH:  1955/09/29  SUBJECTIVE:  patient more awake today. She leans her head towards the right. Able to move her right upper lower extremity and left lower extremity. Very weak on left upper extremity.  TF going on well  RN reports noticing mild regional bleeding when cleaning her up today. Patient has had some bleeding during her ICU stay according to daughter. Per daughter not menstruating for many years REVIEW OF SYSTEMS:   Review of Systems  Unable to perform ROS: Medical condition  Tolerating Diet:no--NG tube Tolerating PT: pending  DRUG ALLERGIES:  No Known Allergies  VITALS:  Blood pressure (!) 157/75, pulse (!) 103, temperature 98.9 F (37.2 C), resp. rate 20, height 5\' 2"  (1.575 m), weight 88.9 kg, SpO2 97 %.  PHYSICAL EXAMINATION:   Physical Exam  GENERAL:  65 y.o.-year-old patient lying in the bed with no acute distress. obese HEENT: Head atraumatic, normocephalic. Oropharynx and nasopharynx clear. NG+ LUNGS: Normal breath sounds bilaterally, no wheezing, rales, rhonchi. No use of accessory muscles of respiration.  CARDIOVASCULAR: S1, S2 normal. No murmurs, rubs, or gallops.  ABDOMEN: Soft, nontender, nondistended. Bowel sounds present.  EXTREMITIES: No cyanosis, clubbing or edema b/l.    NEUROLOGIC: left hemiparesis PSYCHIATRIC:  patient is alert on VC SKIN:  Pressure Injury 05/22/21 Buttocks Right Deep Tissue Pressure Injury - Purple or maroon localized area of discolored intact skin or blood-filled blister due to damage of underlying soft tissue from pressure and/or shear. (Active)  05/22/21 0300  Location: Buttocks  Location Orientation: Right  Staging: Deep Tissue Pressure Injury - Purple or maroon localized area of discolored intact skin or blood-filled blister due to damage of underlying soft tissue from pressure and/or shear.  Wound  Description (Comments):   Present on Admission: Yes     Pressure Injury 05/22/21 Coccyx Stage 2 -  Partial thickness loss of dermis presenting as a shallow open injury with a red, pink wound bed without slough. (Active)  05/22/21 0300  Location: Coccyx  Location Orientation:   Staging: Stage 2 -  Partial thickness loss of dermis presenting as a shallow open injury with a red, pink wound bed without slough.  Wound Description (Comments):   Present on Admission: Yes     Pressure Injury 05/22/21 Cervical Deep Tissue Pressure Injury - Purple or maroon localized area of discolored intact skin or blood-filled blister due to damage of underlying soft tissue from pressure and/or shear. (Active)  05/22/21 0300  Location: Cervical  Location Orientation:   Staging: Deep Tissue Pressure Injury - Purple or maroon localized area of discolored intact skin or blood-filled blister due to damage of underlying soft tissue from pressure and/or shear.  Wound Description (Comments):   Present on Admission: Yes        LABORATORY PANEL:  CBC Recent Labs  Lab 05/29/21 0449  WBC 14.0*  HGB 11.3*  HCT 34.0*  PLT 322     Chemistries  Recent Labs  Lab 05/31/21 0338 06/01/21 0443 06/02/21 0509  NA 137   < > 138  K 4.5   < > 4.7  CL 102   < > 101  CO2 27   < > 30  GLUCOSE 139*   < > 126*  BUN 19   < > 22  CREATININE 0.79   < > 0.75  CALCIUM 8.0*   < > 8.0*  MG 2.2  --   --    < > = values in this interval not displayed.    Cardiac Enzymes No results for input(s): TROPONINI in the last 168 hours. RADIOLOGY:  No results found. ASSESSMENT AND PLAN:   Desiree Mitchell is a 65 y.o. 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 She had extensive skin bruising and also a wound on post neck where her head had rested against a basket. Also has multiple skin lesions and was apparently infested with bedbugs and cockroaches on  admission.  Severe sepsis without shock due to bacteremia from her skin on legs as well as posterior neck present on admission -- ID consult appreciated. Will need linezolid for 14 days for gram-positive coverage given soft tissue infection -- continue IV Rocephin for Proteus infection in the blood for 10 days (10/10) -- Dr. Ola Spurr recommends continue monitor signs of infection after stopping antibiotics -- unable to do TEE secondary to esophageal stricture--- low risk for endocarditis per ID rec assessment-- continue above antibiotics  VA shunt malfunction status post EVD placement on 10/11 by Dr. Lacinda Axon neurosurgery -- patient improving neurology currently. Repeat CT head stable -- removed 10/19 at bedside per Dr. Lacinda Axon -- CSF cultures negative --ok to start DVT proph  posterior neck, buttock, coccyx pressure injuries secondary to being found down at home for unknown amount of time -- continue wound care per wound consult  rhabdomyolysis secondary to hypovolemia in the setting of dehydration -- present on admission now improving  acute respiratory failure with hypoxia  in the postoperative setting after neurological be a shunt requiring mechanical ventilator -- now extubated -- sats hundred percent on 2 L--wean to RA  multifocal embolic infarcts in the setting of sepsis and possible shunt malfunction positive blood culture with embolic infarcts raise possibility of endocarditis but no obvious vegetations were seen onTTE and patient not able to tolerate TEE due to esophageal stricture --- start daily aspirin -- speech therapy to follow -- once able to tolerate overall DC to feeding -- wean to room air  Dyphagia/Nutrition -- patient evaluated by speech therapy--- progression. Recommends continue NG tube feeding. Speech will follow on daily basis. Discussed with daughter that if patient continues to show signs of aspiration will need to consider other option for feeding  like PEG --  continue NG tube feeding for now--will consult GI for PEG placement --10/22-- G.I. consultation for peg placement evaluation. Discussed with daughter Desiree Mitchell she is in agreement with plan  vaginal bleeding-- noted per staff -- no history of recent vegetal bleeding per daughter -- pelvic ultrasound   Procedures:EVD shunt removal Family communication :[name] on the phone 10/22 Consults :ID,cards,neurosurgery. GI CODE STATUS: FULL DVT Prophylaxis :SCD Level of care: Med-Surg Status is: Inpatient  Remains inpatient appropriate because: multiple comorbidities and slow improvement of above mentioned issues  TOTAL TIME TAKING CARE OF THIS PATIENT: 25 minutes.  >50% time spent on counselling and coordination of care  Note: This dictation was prepared with Dragon dictation along with smaller phrase technology. Any transcriptional errors that result from this process are unintentional.  Fritzi Mandes M.D    [facility]   CC: Primary care physician; Pcp, No Patient ID: Desiree Mitchell, female   DOB: [birth year], 65 y.o.   MRN: [id number]

## 2021-06-02 NOTE — Progress Notes (Signed)
Patient's vitals taken at 2105, BP 177/86 P 115, patient in Yellow MEWS. Patient's no signs of distress. Dressing changed/rotated patient prior to vitals checked. Metoprolol given. ICU called, advised to recheck after metoprolol and give labetolol as needed. Rechecked 2230 BP 167/88 P 95. MEWS now green. Order parameters not met for labetolol. No signs of distress. Will continue to monitor.

## 2021-06-02 NOTE — TOC Progression Note (Signed)
Transition of Care [facility]) - Progression Note    Patient Details  Name: Desiree Mitchell MRN: [id number] Date of Birth: 24-Feb-1956  [facility] CM/SW Contact  Izola Price, RN Phone Number: 06/02/2021, 4:24 PM  Clinical Narrative: Contacted daughter, [name], at [phone] regarding patient's SS#. She is still looking and was unable to find SS# card. She was not at home, and will look at home for other sources. Simmie Davies RN CM       Expected Discharge Plan: Long Term Acute Care (LTAC) Barriers to Discharge: Continued Medical Work up  Expected Discharge Plan and Services Expected Discharge Plan: [place] (LTAC) In-house Referral: Clinical Social Work   Post Acute Care Choice: Long Term Acute Care (LTAC) Living arrangements for the past 2 months: Single Family Home                                       Social Determinants of Health (SDOH) Interventions    Readmission Risk Interventions No flowsheet data found.

## 2021-06-02 NOTE — Progress Notes (Signed)
Attending Progress Note  History: Desiree Mitchell presenting on 05/21/21 with after being found unresponsive by family. She has a history of prior subarachnoid hemorrhage requiring shunt placement. Resulting in neurosurgery consult. CT head showed increased ventricular size concerning for shunt malfunction.  She underwent removal of shunt and placement of EVD on 05/21/21.   POD#12: Appears more alert, overall stable.   POD#11: NAEO  POD#10: continues to be neurologically stable. Getting breathing treatment this morning.   POD#9: CT head stable this morning. EVD removed.  POD#8 Patient went for echo today given concern for emboli. EVD remained clamp and patient appears to be at baseline neurologically  POD#7 CT stable after EVD clamped for 24 hours. Sleepy but arousable.   POD#5: Stable CSF output, Patient improved alertness without headache. Increasing level of EVD to 25.  POD#4: MRI showing some watershed type infarcts  POD#3: NAEO  POD#2: patient extubated yesterday but increased O2 requirement overnight   POD#1: Currently off all sedation  Physical Exam: Vitals:   06/02/21 0412 06/02/21 0814  BP: (!) 149/81 (!) 157/75  Pulse: 91 (!) 103  Resp: 18 20  Temp: 98.5 F (36.9 C) 98.9 F (37.2 C)  SpO2: 99% 97%    PERRL, NGT in place, eyes open spontaneously Names simple objects Oriented to self. Continues to follow commands R side more briskly than left Incisions and EVD site c/d/I without signs of drainage  Data:  Recent Labs  Lab 05/31/21 0338 06/01/21 0443 06/02/21 0509  NA 137 139 138  K 4.5 4.7 4.7  CL 102 102 101  CO2 27 29 30   BUN 19 20 22   CREATININE 0.79 0.75 0.75  GLUCOSE 139* 128* 126*  CALCIUM 8.0* 8.2* 8.0*    No results for input(s): AST, ALT, ALKPHOS in the last 168 hours.  Invalid input(s): TBILI    Recent Labs  Lab 05/27/21 0541 05/28/21 0458 05/29/21 0449  WBC 12.9* 12.8* 14.0*  HGB 11.6* 11.6* 11.3*  HCT 35.2* 36.5 34.0*  PLT 314  317 322    No results for input(s): APTT, INR in the last 168 hours.       Recent Imaging  CT Head (10/19): No significant change since the study of 2 days ago. VP shunt in place with stable ventricular size. Extensive chronic small-vessel ischemic changes throughout the brain as above. Subacute infarction in the right basal ganglia/radiating white matter tracts is unchanged by CT.   Assessment/Plan:  Desiree Mitchell is a 65 y.o presenting to the ER after being found down by her family on 05/21/21. She was found to be septic and head CT was concerning for shunt malfunction.  She underwent removal of VA shunt on 05/21/2021 and placement of EVD.  - EVD removed on 05/30/21 Can resume DVT ppx 10/20 from neurosurgical standpoint - MRI demonstrated multifocal infarcts, please consult neurology for further management.  - no plan for further neurosurgical intervention at this time. Will continue to monitor  - staples to be removed on 10/24 - remainder of management per internal medicine recommendations   Desiree Perla, MD Department of Neurosurgery

## 2021-06-03 DIAGNOSIS — A419 Sepsis, unspecified organism: Secondary | ICD-10-CM | POA: Diagnosis not present

## 2021-06-03 DIAGNOSIS — Z515 Encounter for palliative care: Secondary | ICD-10-CM | POA: Diagnosis not present

## 2021-06-03 DIAGNOSIS — J9601 Acute respiratory failure with hypoxia: Secondary | ICD-10-CM | POA: Diagnosis not present

## 2021-06-03 DIAGNOSIS — N179 Acute kidney failure, unspecified: Secondary | ICD-10-CM | POA: Diagnosis not present

## 2021-06-03 LAB — GLUCOSE, CAPILLARY
Glucose-Capillary: 111 mg/dL — ABNORMAL HIGH (ref 70–99)
Glucose-Capillary: 129 mg/dL — ABNORMAL HIGH (ref 70–99)
Glucose-Capillary: 131 mg/dL — ABNORMAL HIGH (ref 70–99)
Glucose-Capillary: 134 mg/dL — ABNORMAL HIGH (ref 70–99)
Glucose-Capillary: 134 mg/dL — ABNORMAL HIGH (ref 70–99)
Glucose-Capillary: 141 mg/dL — ABNORMAL HIGH (ref 70–99)
Glucose-Capillary: 148 mg/dL — ABNORMAL HIGH (ref 70–99)

## 2021-06-03 MED ORDER — ENOXAPARIN SODIUM 60 MG/0.6ML IJ SOSY: 0.5000 mg/kg | PREFILLED_SYRINGE | INTRAMUSCULAR | Status: DC

## 2021-06-03 NOTE — Consult Note (Signed)
[facility] Inpatient Consultation   Patient ID: Desiree Mitchell is a 65 y.o. female.  Requesting Provider: Fritzi Mandes, MD  Date of Admission: 05/21/2021  Date of Consult: 06/03/21   Reason for Consultation: Peg tube placement  Patient's Chief Complaint:   Chief Complaint  Patient presents with   Altered Mental Status    Patient is non verbal. Information and history is garnered via chart review and unable to reach patient's daughter, Desiree Mitchell, over the phone (left voicemail 06/03/21).  Patient is a 65 year old CF with h/o CVA [facility]) s/p VA shunt, Breast CA  who was found down at home for an unknown amount of time. She was hypoxic and hypotensive upon arrival. Her VA shunt was found to be malfunctioning and an 10/10 she underwent removal with EVD placement. Patient was found to be septic 2/2 skin source and treated with abx, but believed to have suffered from multiple septic emboli causing stroke. Currently she is being fed via an NGT and tolerating this. GI is consulted for possible peg placement in setting of aspiration.  Pending placement to snf/ltac pending patient medical improvement.  TEE was attempted but unable to be performed due to potential stricture/narrowing. Speech pathology is following- evaluation stopped as patient seemed to have wet cough with initial intake. On evaluation, today she still seems to be "rattling"/"gurgling" her secretions. She follows some basic commands like grip and moving feet, but does not answer yes or no with head movements or verbalize anything.  Per nursing- no melena, hematochezia, nausea, vomiting.  Currently on lovenox dvt ppx; no other NSAIDs, Anti-plt agents, and anticoagulants Unknown family history of gastrointestinal disease and malignancy Previous Endoscopies: Unknown; Not found with in Epic or CareEverywhere     History reviewed. No pertinent past medical history.  Past Surgical History:  Procedure Laterality  Date   SHUNT REMOVAL Left 05/21/2021   Procedure: SHUNT REMOVAL;  Surgeon: Meade Maw, MD;  Location: [facility] ORS;  Service: Neurosurgery;  Laterality: Left;   TEE WITHOUT CARDIOVERSION N/A 05/29/2021   Procedure: TRANSESOPHAGEAL ECHOCARDIOGRAM (TEE);  Surgeon: Corey Skains, MD;  Location: [facility] ORS;  Service: Cardiovascular;  Laterality: N/A;   VENTRICULOSTOMY Left 05/21/2021   Procedure: VENTRICULOSTOMY;  Surgeon: Meade Maw, MD;  Location: [facility] ORS;  Service: Neurosurgery;  Laterality: Left;    No Known Allergies  History reviewed. No pertinent family history.      Pertinent GI related history and allergies were reviewed per the chart  Review of Systems  Unable to perform ROS: Patient nonverbal    Medications Home Medications No current facility-administered medications on file prior to encounter.   No current outpatient medications on file prior to encounter.   Pertinent GI related medications were reviewed with the patient  Inpatient Medications  Current Facility-Administered Medications:    acetaminophen (TYLENOL) tablet 650 mg, 650 mg, Oral, Q6H PRN, Flora Lipps, MD, 650 mg at 05/31/21 1007   albuterol (PROVENTIL) (2.5 MG/3ML) 0.083% nebulizer solution 2.5 mg, 2.5 mg, Nebulization, Q4H PRN, Rust-Chester, Toribio Harbour L, NP   aspirin chewable tablet 81 mg, 81 mg, Per Tube, Daily, Fritzi Mandes, MD, 81 mg at 06/02/21 0856   chlorhexidine gluconate (MEDLINE KIT) (PERIDEX) 0.12 % solution 15 mL, 15 mL, Mouth Rinse, BID, Tyler Pita, MD, 15 mL at 06/02/21 2106   Chlorhexidine Gluconate Cloth 2 % PADS 6 each, 6 each, Topical, Daily, Flora Lipps, MD, 6 each at 06/02/21 2250   collagenase (SANTYL) ointment, , Topical, Daily, Flora Lipps, MD, Given at 06/02/21  0856   docusate sodium (COLACE) capsule 100 mg, 100 mg, Oral, BID PRN, Rust-[place]ster, Toribio Harbour L, NP   enoxaparin (LOVENOX) injection 45 mg, 0.5 mg/kg, Subcutaneous, Q24H, Fritzi Mandes, MD, 45 mg at 06/02/21  1128   feeding supplement (OSMOLITE 1.2 CAL) liquid 1,000 mL, 1,000 mL, Per Tube, Continuous, Tyler Pita, MD, Last Rate: 70 mL/hr at 06/03/21 0608, 1,000 mL at 06/03/21 0608   free water 30 mL, 30 mL, Per Tube, Q4H, Rust-[place], Toribio Harbour L, NP, 30 mL at 06/03/21 0430   Gerhardt's butt cream, , Topical, TID, Fritzi Mandes, MD, 1 application at 03/88/82 2105   labetalol (NORMODYNE) injection 10 mg, 10 mg, Intravenous, Q2H PRN, Rust-[place]ster, Huel Cote, NP   linezolid (ZYVOX) tablet 600 mg, 600 mg, Per Tube, Q12H, Leonel Ramsay, MD, 600 mg at 06/02/21 2144   metoprolol tartrate (LOPRESSOR) tablet 25 mg, 25 mg, Oral, BID, Fritzi Mandes, MD, 25 mg at 06/02/21 2144   pantoprazole sodium (PROTONIX) 40 mg/20 mL oral suspension 40 mg, 40 mg, Per Tube, Daily, Fritzi Mandes, MD, 40 mg at 06/02/21 8003   polyethylene glycol (MIRALAX / GLYCOLAX) packet 17 g, 17 g, Oral, Daily PRN, Rust-[place]ster, Britton L, NP   sodium chloride flush (NS) 0.9 % injection 10-40 mL, 10-40 mL, Intracatheter, Q12H, Kasa, Kurian, MD, 10 mL at 06/02/21 2300   sodium chloride flush (NS) 0.9 % injection 10-40 mL, 10-40 mL, Intracatheter, PRN, Flora Lipps, MD   thiamine tablet 100 mg, 100 mg, Oral, Daily, Fritzi Mandes, MD, 100 mg at 06/02/21 0856  feeding supplement (OSMOLITE 1.2 CAL) 1,000 mL (06/03/21 4917)    acetaminophen, albuterol, docusate sodium, labetalol, polyethylene glycol, sodium chloride flush   Objective   Vitals:   06/02/21 2230 06/03/21 0026 06/03/21 0400 06/03/21 0411  BP: (!) 167/88 (!) 188/72  (!) 177/74  Pulse: 95 (!) 103  (!) 107  Resp: _0 Temp:  98.3 F (36.8 C)  98.5 F (36.9 C)  TempSrc:  Oral  Oral  SpO2:  94%  95%  Weight:   88.2 kg   Height:         Physical Exam Vitals and nursing note reviewed.  Constitutional:      General: She is not in acute distress.    Appearance: Normal appearance. She is obese. She is ill-appearing. She is not toxic-appearing or diaphoretic.  HENT:      Head: Normocephalic.     Nose:     Comments: NGT in place    Mouth/Throat:     Mouth: Mucous membranes are moist.     Pharynx: Oropharynx is clear.  Eyes:     General: No scleral icterus. Cardiovascular:     Rate and Rhythm: Normal rate and regular rhythm.     Heart sounds: Normal heart sounds. No murmur heard.   No friction rub. No gallop.  Pulmonary:     Effort: No respiratory distress.     Breath sounds: No wheezing, rhonchi or rales.     Comments: Coarse breath sounds bilaterally Abdominal:     General: Bowel sounds are normal. There is no distension.     Palpations: Abdomen is soft.     Tenderness: There is no abdominal tenderness. There is no guarding or rebound.     Comments: Protuberant. No noted surgical scars.  Musculoskeletal:     Cervical back: Neck supple.     Right lower leg: Edema present.     Left lower leg: Edema present.  Comments: Edema of all extremities  Skin:    General: Skin is warm and dry.     Coloration: Skin is not jaundiced or pale.  Neurological:     Mental Status: She is alert.     Comments: Non verbal. Opens her eyes to conversation. Follows commands like grip and moving toes    Laboratory Data Recent Labs  Lab 05/28/21 0458 05/29/21 0449  WBC 12.8* 14.0*  HGB 11.6* 11.3*  HCT 36.5 34.0*  PLT 317 322   Recent Labs  Lab 05/31/21 0338 06/01/21 0443 06/02/21 0509  NA 137 139 138  K 4.5 4.7 4.7  CL 102 102 101  CO2 _0 BUN _1 CALCIUM 8.0* 8.2* 8.0*  GLUCOSE 139* 128* 126*   No results for input(s): INR in the last 168 hours.  No results for input(s): LIPASE in the last 72 hours.      Imaging Studies: US PELVIC COMPLETE WITH TRANSVAGINAL  Result Date: 06/02/2021 CLINICAL DATA:  Postmenopausal bleeding. EXAM: TRANSABDOMINAL ULTRASOUND OF PELVIS TECHNIQUE: Transabdominal ultrasound examination of the pelvis was performed including evaluation of the uterus, ovaries, adnexal regions, and pelvic cul-de-sac.  COMPARISON:  None. FINDINGS: Uterus Measurements: 8.5 x 4.3 x 6.5 cm = volume: 122.4 mL. No fibroids or other mass visualized. Endometrium Thickness: 0.5 cm. Within the endometrial cavity, there is a 1.2 x 2.0 x 1.7 cm echogenic mass with vascular stalk. Right ovary Measurements: 1.6 x 0.8 x 1.6 cm = volume: 1 mL. Normal appearance/no adnexal mass. Left ovary The left ovary was not visualized. Other findings: No abnormal free fluid. Incidentally noted are Foley urinary catheter and rectal catheter. IMPRESSION: 1. A 2.0 cm mass within the endometrial cavity is favored to represent an endometrial polyp. 2. Normal uterus and right ovary. The left ovary was not visualized. Electronically Signed   By: Ileana Roup M.D.   On: 06/02/2021 17:23    Assessment:   # Oropharyngeal Dysphagia - Patient assessed by Speech Pathology and recommended continued assessment - Currently with NGT and Tube feeding in place and tolerating - Noted TEE scope was unable to pass - Unknown abdominal surgical history- no scar noted on exam - BMI 35.6  # Sepsis secondary to skin infection- resolving # Multi focal Stroke 2/2 septic emboli # Acute encephalopathy # rhabdomyolysis- resolving # acute hypoxic respiratory failure- now s/p extubation # multiple pressure related wounds # s/p ventriculostomy and EVD placement and shunt removal # Obesity # h/o CVA with right sided deficits 2007 # h/o Breast CA s/p mastectomy and chemo/radiation # palliative performance score is 20-30% in review of palliative care consult during hospitalization  Plan:  I attempted to reach her daughter on the phone to discuss potential peg and indications/risks, was able to leave voicemail  Evidence shows that PEG tube placement does not improve mortality, longevity, QOL, aspiration risk, or wound healing. Patient has a poor PPS (20-30) and life expectancy is likely limited at this point in time. GOC discussion ongoing between family, palliative care  and primary team. Appreciate assistance. Speech pathology following  TEE raised concern for esophageal stricture which may prohibit endoscopic placement of PEG tube. This would then potentially need to be placed via IR or surgery then  Once time for evaluation/placement arrives- will need to hold dvt ppx day before and of procedure and tube feeds midnight prior  I personally performed the service.  Management of other medical comorbidities as per primary team  Thank you for  allowing [place] to participate in this patient's care. Please don't hesitate to call if any questions or concerns arise.   Annamaria Helling, DO [facility]  Portions of the record may have been created with voice recognition software. Occasional wrong-word or 'sound-a-like' substitutions may have occurred due to the inherent limitations of voice recognition software.  Read the chart carefully and recognize, using context, where substitutions may have occurred.

## 2021-06-03 NOTE — Progress Notes (Signed)
Desiree Mitchell    MR#:  [id number]  DATE OF BIRTH:  [birth year]  SUBJECTIVE:  patient more awake today. She leans her head towards the right. Able to move her right upper lower extremity and left lower extremity. Very weak on left upper extremity.  TF going on well  RN reports noticing mild regional bleeding when cleaning her up today. Patient has had some bleeding during her ICU stay according to daughter. Per daughter not menstruating for many years No more vaginal bleeding today REVIEW OF SYSTEMS:   Review of Systems  Unable to perform ROS: Medical condition  Tolerating Diet:no--NG tube Tolerating PT: pending  DRUG ALLERGIES:  No Known Allergies  VITALS:  Blood pressure (!) 167/78, pulse (!) 106, temperature 99 F (37.2 C), temperature source Axillary, resp. rate 18, height 5\' 2"  (1.575 m), weight 88.2 kg, SpO2 96 %.  PHYSICAL EXAMINATION:   Physical Exam  GENERAL:  65 y.o.-year-old patient lying in the bed with no acute distress. obese HEENT: Head atraumatic, normocephalic. Oropharynx and nasopharynx clear. NG+ LUNGS: Normal breath sounds bilaterally, no wheezing, rales, rhonchi. No use of accessory muscles of respiration.  CARDIOVASCULAR: S1, S2 normal. No murmurs, rubs, or gallops.  ABDOMEN: Soft, nontender, nondistended. Bowel sounds present.  EXTREMITIES: No cyanosis, clubbing or edema b/l.    NEUROLOGIC: left hemiparesis PSYCHIATRIC:  patient is alert on VC SKIN:  Pressure Injury 05/22/21 Buttocks Right Deep Tissue Pressure Injury - Purple or maroon localized area of discolored intact skin or blood-filled blister due to damage of underlying soft tissue from pressure and/or shear. (Active)  05/22/21 0300  Location: Buttocks  Location Orientation: Right  Staging: Deep Tissue Pressure Injury - Purple or maroon localized area of discolored intact skin or blood-filled blister due to damage of  underlying soft tissue from pressure and/or shear.  Wound Description (Comments):   Present on Admission: Yes     Pressure Injury 05/22/21 Coccyx Stage 2 -  Partial thickness loss of dermis presenting as a shallow open injury with a red, pink wound bed without slough. (Active)  05/22/21 0300  Location: Coccyx  Location Orientation:   Staging: Stage 2 -  Partial thickness loss of dermis presenting as a shallow open injury with a red, pink wound bed without slough.  Wound Description (Comments):   Present on Admission: Yes     Pressure Injury 05/22/21 Cervical Deep Tissue Pressure Injury - Purple or maroon localized area of discolored intact skin or blood-filled blister due to damage of underlying soft tissue from pressure and/or shear. (Active)  05/22/21 0300  Location: Cervical  Location Orientation:   Staging: Deep Tissue Pressure Injury - Purple or maroon localized area of discolored intact skin or blood-filled blister due to damage of underlying soft tissue from pressure and/or shear.  Wound Description (Comments):   Present on Admission: Yes        LABORATORY PANEL:  CBC Recent Labs  Lab 05/29/21 0449  WBC 14.0*  HGB 11.3*  HCT 34.0*  PLT 322     Chemistries  Recent Labs  Lab 05/31/21 0338 06/01/21 0443 06/02/21 0509  NA 137   < > 138  K 4.5   < > 4.7  CL 102   < > 101  CO2 27   < > 30  GLUCOSE 139*   < > 126*  BUN 19   < > 22  CREATININE 0.79   < > 0.75  CALCIUM 8.0*   < > 8.0*  MG 2.2  --   --    < > = values in this interval not displayed.    Cardiac Enzymes No results for input(s): TROPONINI in the last 168 hours. RADIOLOGY:  US PELVIC COMPLETE WITH TRANSVAGINAL  Result Date: 06/02/2021 CLINICAL DATA:  Postmenopausal bleeding. EXAM: TRANSABDOMINAL ULTRASOUND OF PELVIS TECHNIQUE: Transabdominal ultrasound examination of the pelvis was performed including evaluation of the uterus, ovaries, adnexal regions, and pelvic cul-de-sac. COMPARISON:  None.  FINDINGS: Uterus Measurements: 8.5 x 4.3 x 6.5 cm = volume: 122.4 mL. No fibroids or other mass visualized. Endometrium Thickness: 0.5 cm. Within the endometrial cavity, there is a 1.2 x 2.0 x 1.7 cm echogenic mass with vascular stalk. Right ovary Measurements: 1.6 x 0.8 x 1.6 cm = volume: 1 mL. Normal appearance/no adnexal mass. Left ovary The left ovary was not visualized. Other findings: No abnormal free fluid. Incidentally noted are Foley urinary catheter and rectal catheter. IMPRESSION: 1. A 2.0 cm mass within the endometrial cavity is favored to represent an endometrial polyp. 2. Normal uterus and right ovary. The left ovary was not visualized. Electronically Signed   By: Ileana Roup M.D.   On: 06/02/2021 17:23   ASSESSMENT AND PLAN:   Desiree Mitchell is a 65 y.o. 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 She had extensive skin bruising and also a wound on post neck where her head had rested against a basket. Also has multiple skin lesions and was apparently infested with bedbugs and cockroaches on admission.  Severe sepsis without shock due to bacteremia from her skin on legs as well as posterior neck present on admission -- ID consult appreciated. Will need linezolid for 14 days for gram-positive coverage given soft tissue infection -- continue IV Rocephin for Proteus infection in the blood for 10 days (10/10) -- Dr. Ola Spurr recommends continue monitor signs of infection after stopping antibiotics -- unable to do TEE secondary to esophageal stricture--- low risk for endocarditis per ID rec assessment-- continue above antibiotics  VA shunt malfunction status post EVD placement on 10/11 by Dr. Lacinda Axon neurosurgery -- patient improving neurology currently. Repeat CT head stable -- removed 10/19 at bedside per Dr. Lacinda Axon -- CSF cultures negative --ok to start DVT proph  posterior neck, buttock, coccyx pressure injuries  secondary to being found down at home for unknown amount of time -- continue wound care per wound consult  rhabdomyolysis secondary to hypovolemia in the setting of dehydration -- present on admission now improving  acute respiratory failure with hypoxia  in the postoperative setting after neurological be a shunt requiring mechanical ventilator -- now extubated -- sats hundred percent on 2 L--wean to RA  multifocal embolic infarcts in the setting of sepsis and possible shunt malfunction positive blood culture with embolic infarcts raise possibility of endocarditis but no obvious vegetations were seen onTTE and patient not able to tolerate TEE due to esophageal stricture --- start daily aspirin -- speech therapy to follow-- showing signs of aspiration -- wean to room air  Dyphagia/Nutrition -- patient evaluated by speech therapy--- progression. Recommends continue NG tube feeding. Speech will follow on daily basis. Discussed with daughter that if patient continues to show signs of aspiration will need to consider other option for feeding  like PEG -- continue NG tube feeding for now--will consult GI for PEG placement --10/22-- G.I. consultation for peg placement evaluation. Discussed  with daughter Desiree Mitchell she is in agreement with plan -10/23--PEG on Next Tuesday.hold lovenox mon/tue  vaginal bleeding-- noted per staff -- no history of recent vaginal bleeding per daughter -- pelvic ultrasound showed Endometrial polyp with vascular stalk could be the reason for bleeding --curbsided Gyn (dr Glennon Mac)  different options given-- history of scope he and removal of polyp versus placement of IUD versus IV estrogen versus oral Provera. -- Given multiple medical issues with recent stroke will at present continue to monitor her hemoglobin and amount of bleeding. If she continues and hemoglobin drops then will consult GYN formally. -- Given her new stroke will avoid hormone replacement at present unless  deemed necessary   Procedures:EVD shunt removal Family communication :[name] on the phone 10/22 Consults :ID,cards,neurosurgery. GI CODE STATUS: FULL DVT Prophylaxis :SCD Level of care: Med-Surg Status is: Inpatient  Remains inpatient appropriate because: multiple comorbidities and slow improvement of above mentioned issues  TOTAL TIME TAKING CARE OF THIS PATIENT: 25 minutes.  >50% time spent on counselling and coordination of care  Note: This dictation was prepared with Dragon dictation along with smaller phrase technology. Any transcriptional errors that result from this process are unintentional.  Fritzi Mandes M.D    [facility]   CC: Primary care physician; Pcp, No Patient ID: Desiree Mitchell, female   DOB: 1956/05/30, 65 y.o.   MRN: [id number]

## 2021-06-03 NOTE — Progress Notes (Signed)
PEG Consent  Percutaneous endoscopy gastrostomy tube placement with interventions as necessary has been discussed with the patient's daughter, Desiree Mitchell . Informed consent was obtained from the patient's daughter  after explaining the indication, nature, and risks of the procedure including but not limited to death, bleeding, perforation, missed neoplasm/lesions, cardiorespiratory compromise, infection, and reaction to medications.  Desiree Mitchell was counseled on issues that may prevent tube placement such as large hiatal hernia, obstruction, strictures, etc. She was informed that a Peg tube being placed does not improve mortality, longevity, QOL, aspiration risk, or wound healing. Also informed that a peg tube is not necessarily permanent and can later be removed if patient does improve. Opportunity for questions was given and appropriate answers were provided. Desiree Mitchell has verbalized understanding and is amenable to the patient undergoing the procedure. At this time, plan will be for placement Tuesday afternoon pending endoscopy suite availability and one of my colleagues being able to assist. Tube feeds will need to be held at midnight prior to procedure and lovenox held Monday and Tuesday.  Desiree Helling, DO [facility]

## 2021-06-04 ENCOUNTER — Inpatient Hospital Stay: Payer: Medicare (Managed Care)

## 2021-06-04 DIAGNOSIS — J9601 Acute respiratory failure with hypoxia: Secondary | ICD-10-CM | POA: Diagnosis not present

## 2021-06-04 DIAGNOSIS — N950 Postmenopausal bleeding: Secondary | ICD-10-CM | POA: Diagnosis not present

## 2021-06-04 DIAGNOSIS — Z515 Encounter for palliative care: Secondary | ICD-10-CM | POA: Diagnosis not present

## 2021-06-04 DIAGNOSIS — E669 Obesity, unspecified: Secondary | ICD-10-CM

## 2021-06-04 DIAGNOSIS — N179 Acute kidney failure, unspecified: Secondary | ICD-10-CM | POA: Diagnosis not present

## 2021-06-04 DIAGNOSIS — A419 Sepsis, unspecified organism: Secondary | ICD-10-CM | POA: Diagnosis not present

## 2021-06-04 LAB — GLUCOSE, CAPILLARY
Glucose-Capillary: 125 mg/dL — ABNORMAL HIGH (ref 70–99)
Glucose-Capillary: 132 mg/dL — ABNORMAL HIGH (ref 70–99)

## 2021-06-04 LAB — CBC
HCT: 32.8 % — ABNORMAL LOW (ref 36.0–46.0)
Hemoglobin: 10.7 g/dL — ABNORMAL LOW (ref 12.0–15.0)
MCH: 32.9 pg (ref 26.0–34.0)
MCHC: 32.6 g/dL (ref 30.0–36.0)
MCV: 100.9 fL — ABNORMAL HIGH (ref 80.0–100.0)
Platelets: 325 10*3/uL (ref 150–400)
RBC: 3.25 MIL/uL — ABNORMAL LOW (ref 3.87–5.11)
RDW: 13.1 % (ref 11.5–15.5)
WBC: 14.1 10*3/uL — ABNORMAL HIGH (ref 4.0–10.5)
nRBC: 0 % (ref 0.0–0.2)

## 2021-06-04 MED ORDER — ENOXAPARIN SODIUM 60 MG/0.6ML IJ SOSY: 0.5000 mg/kg | PREFILLED_SYRINGE | INTRAMUSCULAR | Status: DC

## 2021-06-04 NOTE — Progress Notes (Signed)
   06/04/21 0700  Assess: MEWS Score  Temp 98.2 F (36.8 C) (Actual time taken about 0740 - time not chnaged.))  BP (!) 159/73  Pulse Rate (!) 112  Resp (!) 30 (SPO2 stable )  SpO2 98 %  O2 Device Room Air  Assess: MEWS Score  MEWS Temp 0  MEWS Systolic 0  MEWS Pulse 2  MEWS RR 2  MEWS LOC 0  MEWS Score 4  MEWS Score Color Red  Assess: if the MEWS score is Yellow or Red  Were vital signs taken at a resting state? Yes  Focused Assessment No change from prior assessment  Does the patient meet 2 or more of the SIRS criteria? No  MEWS guidelines implemented *See Row Information* No, altered LOC is baseline  Take Vital Signs  Increase Vital Sign Frequency  Red: Q 1hr X 4 then Q 4hr X 4, if remains red, continue Q 4hrs  Escalate  MEWS: Escalate Red: discuss with charge nurse/RN and provider, consider discussing with RRT  Notify: Charge Nurse/RN  Name of Charge Nurse/RN Notified Helene Kelp, RN  Date Charge Nurse/RN Notified 06/04/21  Time Charge Nurse/RN Notified 0757  Notify: Provider  Provider Name/Title Fritzi Mandes  Date Provider Notified 06/04/21  Time Provider Notified [phone]  Notification Type Page  Notification Reason Change in status (red mews)  Provider response No new orders (will round as possible)  Date of Provider Response 06/04/21  Time of Provider Response 0810  Document  Patient Outcome Stabilized after interventions;Other (Comment) (Unknown at this time - will continue to assess)  Progress note created (see row info) Yes  Assess: SIRS CRITERIA  SIRS Temperature  0  SIRS Pulse 1  SIRS Respirations  1  SIRS WBC 1  SIRS Score Sum  3

## 2021-06-04 NOTE — Progress Notes (Signed)
PT Cancellation Note  Patient Details Name: Desiree Mitchell MRN: [id number] DOB: 1955-10-16   Cancelled Treatment:    Reason Eval/Treat Not Completed: Patient not medically ready.  Chart reviewed.  Nurse reports pt with increased respiratory rate and increased HR this morning and recommending holding therapy at this time.  Will re-attempt PT treatment session at a later date/time as medically appropriate.  Leitha Bleak, PT 06/04/21, 11:10 AM

## 2021-06-04 NOTE — Progress Notes (Signed)
Attending Progress Note  History: Desiree Mitchell presenting on 05/21/21 with after being found unresponsive by family. She has a history of prior subarachnoid hemorrhage requiring shunt placement. Resulting in neurosurgery consult. CT head showed increased ventricular size concerning for shunt malfunction.  She underwent removal of shunt and placement of EVD on 05/21/21.   POD#14: Discussion regarding peg tube placement with GI underway. Pt tolerated staple removal  POD#12: Appears more alert, overall stable.   POD#11: NAEO  POD#10: continues to be neurologically stable. Getting breathing treatment this morning.   POD#9: CT head stable this morning. EVD removed.  POD#8 Patient went for echo today given concern for emboli. EVD remained clamp and patient appears to be at baseline neurologically  POD#7 CT stable after EVD clamped for 24 hours. Sleepy but arousable.   POD#5: Stable CSF output, Patient improved alertness without headache. Increasing level of EVD to 25.  POD#4: MRI showing some watershed type infarcts  POD#3: NAEO  POD#2: patient extubated yesterday but increased O2 requirement overnight   POD#1: Currently off all sedation  Physical Exam: Vitals:   06/04/21 0811 06/04/21 0840  BP: (!) 144/88 (!) 167/87  Pulse: (!) 110 (!) 110  Resp: (!) 24 (!) 28  Temp: 98 F (36.7 C) 98.8 F (37.1 C)  SpO2: 97% 97%    PERRL, NGT in place, eyes open spontaneously Oriented to self. Continues to follow commands R side more briskly than left Incisions and EVD site c/d/I without signs of drainage  Data:  Recent Labs  Lab 05/31/21 0338 06/01/21 0443 06/02/21 0509  NA 137 139 138  K 4.5 4.7 4.7  CL 102 102 101  CO2 27 29 30   BUN 19 20 22   CREATININE 0.79 0.75 0.75  GLUCOSE 139* 128* 126*  CALCIUM 8.0* 8.2* 8.0*    No results for input(s): AST, ALT, ALKPHOS in the last 168 hours.  Invalid input(s): TBILI    Recent Labs  Lab 05/29/21 0449 06/04/21 0430  WBC 14.0*  14.1*  HGB 11.3* 10.7*  HCT 34.0* 32.8*  PLT 322 325    No results for input(s): APTT, INR in the last 168 hours.       Recent Imaging  CT Head (10/19): No significant change since the study of 2 days ago. VP shunt in place with stable ventricular size. Extensive chronic small-vessel ischemic changes throughout the brain as above. Subacute infarction in the right basal ganglia/radiating white matter tracts is unchanged by CT.   Assessment/Plan:  Desiree Mitchell is a 65 y.o presenting to the ER after being found down by her family on 05/21/21. She was found to be septic and head CT was concerning for shunt malfunction.  She underwent removal of VA shunt on 05/21/2021 and placement of EVD.  - EVD removed on 05/30/21 Can resume DVT ppx 10/20 from neurosurgical standpoint - MRI demonstrated multifocal infarcts, please consult neurology for further management.  - no plan for further neurosurgical intervention at this time. Will continue to monitor  - staples removed on 62/70/35 without complications.  - remainder of management per internal medicine recommendations   Cooper Render PA-C Department of Neurosurgery

## 2021-06-04 NOTE — Progress Notes (Addendum)
Nutrition Follow-up  DOCUMENTATION CODES:  Obesity unspecified  INTERVENTION:  Continue the following TF regimen: Osmolite 1.2 @ 60m/hr Free water flushes 383mq4 hours  Regimen provides 2016kcal/day, 93g/day protein and 155885may of free water   NUTRITION DIAGNOSIS:  Inadequate oral intake related to acute illness as evidenced by NPO status.  GOAL:  Patient will meet greater than or equal to 90% of their needs -met with tube feeds   MONITOR:  Labs, Weight trends, TF tolerance, Skin, I & O's  ASSESSMENT:  [age]o female with h/o HTN, asthma, breast cancer, CVA and subarachnoid hemorrhage s/p shunt placement for management of hydrocephalus > 10 years ago who is now admitted with shunt malfunction, sepsis and AMS now s/p left parietal approach ventriculostomy 10/11  Pt able to me moved out of the ICU 10/21. Evaluated by SLP who continues to recommend NPO. MD discussed long term feeding plan with family, PEG tube planned to be placed Tuesday (10/25).  TF continue to infuse with good tolerance at goal rate of 74m44m Weight is overall stable since admission based on first measured weight. Pt will need placement prior to returning home to regain strength. [facility]: Scheduled Meds:  free water  30 mL Per Tube Q4H   linezolid  600 mg Per Tube Q12H   pantoprazole sodium  40 mg Per Tube Daily   thiamine  100 mg Oral Daily   Continuous Infusions:  feeding supplement (OSMOLITE 1.2 CAL) 1,000 mL (06/03/21 2317)   PRN Meds:  docusate sodium, polyethylene glycol  Labs Reviewed: SBG ranges from 111-148 mg/dL over the last 24 hours  Diet Order:   Diet Order             Diet NPO time specified  Diet effective midnight                  EDUCATION NEEDS:  No education needs have been identified at this time  Skin:  Skin Assessment: Reviewed RN Assessment (DTPI to the right ischial tuberosity 4 cm x 5 cm, incision head, DPI  to back of neck)  Last BM:  10/22 - type 6  Height:  Ht Readings from Last 1 Encounters:  05/21/21 5' 2" (1.575 m)    Weight:  Wt Readings from Last 1 Encounters:  06/03/21 88.2 kg    Ideal Body Weight:  50 kg  BMI:  Body mass index is 35.56 kg/m.  Estimated Nutritional Needs:  Kcal:  1800-2100kcal/day Protein:  90-105g/day Fluid:  1.5-1.8L/day   RachRanell Patrick, LDN Clinical Dietitian RD pager # available in [facility]  After hours/weekend pager # available in [facility]

## 2021-06-04 NOTE — Progress Notes (Signed)
[facility] [place] NAME: Anthea Udovich    MR#:  [id number]  DATE OF BIRTH:  Dec 14, 1955  SUBJECTIVE:  patient more awake today. She leans her head towards the right. Able to move her right upper lower extremity and left lower extremity. Very weak on left upper extremity.  TF going on well  RN did report vaginal bleeding -- seems to be more frequent. No blood clots. Hemoglobin stable. Patient appears to have gurgling sounds. Low-grade fever with mild tachypnea. No vomiting REVIEW OF SYSTEMS:   Review of Systems  Unable to perform ROS: Medical condition  Tolerating Diet:no--NG tube Tolerating PT: pending  DRUG ALLERGIES:  No Known Allergies  VITALS:  Blood pressure (!) 144/80, pulse 91, temperature 98.1 F (36.7 C), resp. rate (!) 24, height 5\' 2"  (1.575 m), weight 88.2 kg, SpO2 96 %.  PHYSICAL EXAMINATION:   Physical Exam  GENERAL:  65 y.o.-year-old patient lying in the bed with no acute distress. obese HEENT: Head atraumatic, normocephalic. Oropharynx and nasopharynx clear. NG+ LUNGS: coarse breath sounds bilaterally, no wheezing, rales, rhonchi. No use of accessory muscles of respiration.  CARDIOVASCULAR: S1, S2 normal. No murmurs, rubs, or gallops.  ABDOMEN: Soft, nontender, nondistended. Bowel sounds present.  EXTREMITIES: No cyanosis, clubbing or edema b/l.    NEUROLOGIC: left hemiparesis PSYCHIATRIC:  patient is alert on VC SKIN:  Pressure Injury 05/22/21 Buttocks Right Deep Tissue Pressure Injury - Purple or maroon localized area of discolored intact skin or blood-filled blister due to damage of underlying soft tissue from pressure and/or shear. (Active)  05/22/21 0300  Location: Buttocks  Location Orientation: Right  Staging: Deep Tissue Pressure Injury - Purple or maroon localized area of discolored intact skin or blood-filled blister due to damage of underlying soft tissue from pressure and/or shear.  Wound Description  (Comments):   Present on Admission: Yes     Pressure Injury 05/22/21 Coccyx Stage 2 -  Partial thickness loss of dermis presenting as a shallow open injury with a red, pink wound bed without slough. (Active)  05/22/21 0300  Location: Coccyx  Location Orientation:   Staging: Stage 2 -  Partial thickness loss of dermis presenting as a shallow open injury with a red, pink wound bed without slough.  Wound Description (Comments):   Present on Admission: Yes     Pressure Injury 05/22/21 Cervical Deep Tissue Pressure Injury - Purple or maroon localized area of discolored intact skin or blood-filled blister due to damage of underlying soft tissue from pressure and/or shear. (Active)  05/22/21 0300  Location: Cervical  Location Orientation:   Staging: Deep Tissue Pressure Injury - Purple or maroon localized area of discolored intact skin or blood-filled blister due to damage of underlying soft tissue from pressure and/or shear.  Wound Description (Comments):   Present on Admission: Yes        LABORATORY PANEL:  CBC Recent Labs  Lab 06/04/21 0430  WBC 14.1*  HGB 10.7*  HCT 32.8*  PLT 325     Chemistries  Recent Labs  Lab 05/31/21 0338 06/01/21 0443 06/02/21 0509  NA 137   < > 138  K 4.5   < > 4.7  CL 102   < > 101  CO2 27   < > 30  GLUCOSE 139*   < > 126*  BUN 19   < > 22  CREATININE 0.79   < > 0.75  CALCIUM 8.0*   < > 8.0*  MG  2.2  --   --    < > = values in this interval not displayed.    Cardiac Enzymes No results for input(s): TROPONINI in the last 168 hours. RADIOLOGY:  US PELVIC COMPLETE WITH TRANSVAGINAL  Result Date: 06/02/2021 CLINICAL DATA:  Postmenopausal bleeding. EXAM: TRANSABDOMINAL ULTRASOUND OF PELVIS TECHNIQUE: Transabdominal ultrasound examination of the pelvis was performed including evaluation of the uterus, ovaries, adnexal regions, and pelvic cul-de-sac. COMPARISON:  None. FINDINGS: Uterus Measurements: 8.5 x 4.3 x 6.5 cm = volume: 122.4 mL. No  fibroids or other mass visualized. Endometrium Thickness: 0.5 cm. Within the endometrial cavity, there is a 1.2 x 2.0 x 1.7 cm echogenic mass with vascular stalk. Right ovary Measurements: 1.6 x 0.8 x 1.6 cm = volume: 1 mL. Normal appearance/no adnexal mass. Left ovary The left ovary was not visualized. Other findings: No abnormal free fluid. Incidentally noted are Foley urinary catheter and rectal catheter. IMPRESSION: 1. A 2.0 cm mass within the endometrial cavity is favored to represent an endometrial polyp. 2. Normal uterus and right ovary. The left ovary was not visualized. Electronically Signed   By: Ileana Roup M.D.   On: 06/02/2021 17:23   ASSESSMENT AND PLAN:   Tomasita Beevers is a 65 y.o. 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 She had extensive skin bruising and also a wound on post neck where her head had rested against a basket. Also has multiple skin lesions and was apparently infested with bedbugs and cockroaches on admission.  Severe sepsis without shock due to bacteremia from her skin on legs as well as posterior neck present on admission -- ID consult appreciated. Will need linezolid for 14 days for gram-positive coverage given soft tissue infection -- continue IV Rocephin for Proteus infection in the blood for 10 days (10/10) -- Dr. Ola Spurr recommends continue monitor signs of infection after stopping antibiotics -- unable to do TEE secondary to esophageal stricture--- low risk for endocarditis per ID rec assessment-- continue above antibiotics  VA shunt malfunction status post EVD placement on 10/11 by Dr. Lacinda Axon neurosurgery -- patient improving neurology currently. Repeat CT head stable -- removed 10/19 at bedside per Dr. Lacinda Axon -- CSF cultures negative --ok to start DVT proph per neurology  posterior neck, buttock, coccyx pressure injuries secondary to being found down at home for unknown amount of  time -- continue wound care per wound consult  rhabdomyolysis secondary to hypovolemia in the setting of dehydration -- present on admission now improved  acute respiratory failure with hypoxia  in the postoperative setting after neurological be a shunt requiring mechanical ventilator -- now extubated -- sats hundred percent on 2 L--wean to RA -10/24-- chest x-ray pending. Patient has increased respiratory rate and some gurgling sound. Worried about aspiration.  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 Recommends continue NG tube feeding. Speech will follow on daily basis. Discussed with daughter that if patient continues to show signs of aspiration will need to consider other option for feeding  like PEG -- continue NG tube feeding for now--will consult GI for PEG  placement --10/22-- G.I. consultation for peg placement evaluation. Discussed with daughter Lauro Regulus she is in agreement with plan -10/23--PEG on Next Tuesday.hold lovenox mon/tue -10/24--PEG placement on hold Dr Virgina Jock signed off due to increased RR, fever and overall not stable for procedure. CXR pending  vaginal bleeding-- noted per staff -- no history of recent vaginal bleeding per daughter -- pelvic ultrasound showed Endometrial polyp with vascular stalk could be the reason for bleeding --curbsided Gyn (dr Glennon Mac)  different options given-- history of scope he and removal of polyp versus placement of IUD versus IV estrogen versus oral Provera. -- Given multiple medical issues with recent stroke will at present continue to  monitor her hemoglobin and amount of bleeding. If she continues and hemoglobin drops then will consult GYN formally. -- Given her new stroke will avoid hormone replacement at present unless deemed necessary --10/24--GYN consulted. Pt continues to have vaginal bleed. Will HOLD LOVENOX for now.  Spoke with daughter at length today. She does want to pursue with peg tube placement when patient is stable. She does understand patient is critically ill with multiple medical problems.   Procedures:EVD shunt removal Family communication :[name] on the phone 10/23 Consults :ID,cards,neurosurgery. GI CODE STATUS: FULL DVT Prophylaxis :SCD Level of care: Med-Surg Status is: Inpatient  Remains inpatient appropriate because: multiple comorbidities and slow improvement of above mentioned issues  TOTAL TIME TAKING CARE OF THIS PATIENT: 25 minutes.  >50% time spent on counselling and coordination of care  Note: This dictation was prepared with Dragon dictation along with smaller phrase technology. Any transcriptional errors that result from this process are unintentional.  Fritzi Mandes M.D    [facility]   CC: Primary care physician; Pcp, No Patient ID: Gaige Fussner, female   DOB: 03-09-1956, 65 y.o.   MRN: [id number]

## 2021-06-04 NOTE — Progress Notes (Signed)
[facility] Gastroenterology Inpatient Follow-up/Progress Note   Patient ID: Desiree Mitchell is a 65 y.o. female.  Overnight Events / Subjective Findings Patient seen and evaluated today. No family at bedside. D/w nursing. Pt with tachypnea and tachycardia- primary team evaluating for infection as wbc count >14K.  Pt still non-verbal to me, but opens eyes to voice.  Was able to speak to her daughter on the phone and update her that procedure will be postponed  No other GI issues in discussion with nursing.  Review of Systems  Constitutional:  Negative for activity change, appetite change, fatigue, fever and unexpected weight change.  HENT:  Negative for trouble swallowing and voice change.   Respiratory:  Negative for shortness of breath and wheezing.   Cardiovascular:  Negative for chest pain and palpitations.  Gastrointestinal:  Negative for abdominal distention, abdominal pain, anal bleeding, blood in stool, constipation, diarrhea, nausea, rectal pain and vomiting.  Musculoskeletal:  Negative for arthralgias and myalgias.  Skin:  Negative for color change and pallor.  Neurological:  Negative for dizziness, syncope and weakness.  Psychiatric/Behavioral:  Negative for confusion.   All other systems reviewed and are negative.   Medications  Current Facility-Administered Medications:    acetaminophen (TYLENOL) tablet 650 mg, 650 mg, Oral, Q6H PRN, Flora Lipps, MD, 650 mg at 05/31/21 1007   albuterol (PROVENTIL) (2.5 MG/3ML) 0.083% nebulizer solution 2.5 mg, 2.5 mg, Nebulization, Q4H PRN, Rust-Chester, Toribio Harbour L, NP   aspirin chewable tablet 81 mg, 81 mg, Per Tube, Daily, Fritzi Mandes, MD, 81 mg at 06/04/21 0925   chlorhexidine gluconate (MEDLINE KIT) (PERIDEX) 0.12 % solution 15 mL, 15 mL, Mouth Rinse, BID, Tyler Pita, MD, 15 mL at 06/04/21 6203   Chlorhexidine Gluconate Cloth 2 % PADS 6 each, 6 each, Topical, Daily, Flora Lipps, MD, 6 each at 06/03/21 2150   collagenase (SANTYL)  ointment, , Topical, Daily, Flora Lipps, MD, 1 application at 55/97/41 0926   docusate sodium (COLACE) capsule 100 mg, 100 mg, Oral, BID PRN, Rust-Chester, Huel Cote, NP   [START ON 06/06/2021] enoxaparin (LOVENOX) injection 45 mg, 0.5 mg/kg, Subcutaneous, Q24H, Fritzi Mandes, MD   feeding supplement (OSMOLITE 1.2 CAL) liquid 1,000 mL, 1,000 mL, Per Tube, Continuous, Tyler Pita, MD, Last Rate: 70 mL/hr at 06/03/21 2317, 1,000 mL at 06/03/21 2317   free water 30 mL, 30 mL, Per Tube, Q4H, Rust-Chester, Britton L, NP, 30 mL at 06/04/21 1220   Gerhardt's butt cream, , Topical, TID, Fritzi Mandes, MD, 1 application at 63/84/53 6468   labetalol (NORMODYNE) injection 10 mg, 10 mg, Intravenous, Q2H PRN, Rust-Chester, Toribio Harbour L, NP   linezolid (ZYVOX) tablet 600 mg, 600 mg, Per Tube, Q12H, Leonel Ramsay, MD, 600 mg at 06/04/21 0924   metoprolol tartrate (LOPRESSOR) tablet 25 mg, 25 mg, Oral, BID, Fritzi Mandes, MD, 25 mg at 06/04/21 0925   pantoprazole sodium (PROTONIX) 40 mg/20 mL oral suspension 40 mg, 40 mg, Per Tube, Daily, Fritzi Mandes, MD, 40 mg at 06/04/21 0321   polyethylene glycol (MIRALAX / GLYCOLAX) packet 17 g, 17 g, Oral, Daily PRN, Rust-Chester, Britton L, NP   sodium chloride flush (NS) 0.9 % injection 10-40 mL, 10-40 mL, Intracatheter, Q12H, Kasa, Kurian, MD, 10 mL at 06/04/21 0928   sodium chloride flush (NS) 0.9 % injection 10-40 mL, 10-40 mL, Intracatheter, PRN, Flora Lipps, MD, 10 mL at 06/04/21 2248   thiamine tablet 100 mg, 100 mg, Oral, Daily, Fritzi Mandes, MD, 100 mg at 06/04/21 0924  feeding supplement (  OSMOLITE 1.2 CAL) 1,000 mL (06/03/21 2317)    acetaminophen, albuterol, docusate sodium, labetalol, polyethylene glycol, sodium chloride flush   Objective    Vitals:   06/04/21 0840 06/04/21 1040 06/04/21 1240 06/04/21 1245  BP: (!) 167/87 (!) 154/68 (!) 150/72 (!) 144/80  Pulse: (!) 110 85 82 91  Resp: (!) 28 (!) 31 (!) 30 (!) 24  Temp: 98.8 F (37.1 C) 98.2 F  (36.8 C) 98.8 F (37.1 C) 98.1 F (36.7 C)  TempSrc: Oral Oral    SpO2: 97% 92% 94% 96%  Weight:      Height:         Physical Exam Vitals and nursing note reviewed.  Constitutional:      General: She is not in acute distress.    Appearance: She is ill-appearing. She is not toxic-appearing or diaphoretic.  HENT:     Head: Normocephalic.     Nose: Nose normal.     Comments: Ngt in place Eyes:     General: No scleral icterus. Neck:     Comments: Bandage over left neck Cardiovascular:     Rate and Rhythm: Regular rhythm. Tachycardia present.  Pulmonary:     Breath sounds: No wheezing.     Comments: Tachypneic, coarse breath sounds bilaterally Abdominal:     General: Bowel sounds are normal. There is no distension.     Palpations: Abdomen is soft.     Tenderness: There is no abdominal tenderness. There is no guarding or rebound.  Musculoskeletal:     Cervical back: Neck supple.     Right lower leg: No edema.  Skin:    General: Skin is warm and dry.  Neurological:     Mental Status: She is alert.     Comments: Non verbal. Opens eyes to voice     Laboratory Data Recent Labs  Lab 05/29/21 0449 06/04/21 0430  WBC 14.0* 14.1*  HGB 11.3* 10.7*  HCT 34.0* 32.8*  PLT 322 325   Recent Labs  Lab 05/31/21 0338 06/01/21 0443 06/02/21 0509  NA 137 139 138  K 4.5 4.7 4.7  CL 102 102 101  CO2 27 29 30   BUN 19 20 22   CREATININE 0.79 0.75 0.75  CALCIUM 8.0* 8.2* 8.0*  GLUCOSE 139* 128* 126*   No results for input(s): INR in the last 168 hours.    Imaging Studies: US PELVIC COMPLETE WITH TRANSVAGINAL  Result Date: 06/02/2021 CLINICAL DATA:  Postmenopausal bleeding. EXAM: TRANSABDOMINAL ULTRASOUND OF PELVIS TECHNIQUE: Transabdominal ultrasound examination of the pelvis was performed including evaluation of the uterus, ovaries, adnexal regions, and pelvic cul-de-sac. COMPARISON:  None. FINDINGS: Uterus Measurements: 8.5 x 4.3 x 6.5 cm = volume: 122.4 mL. No fibroids  or other mass visualized. Endometrium Thickness: 0.5 cm. Within the endometrial cavity, there is a 1.2 x 2.0 x 1.7 cm echogenic mass with vascular stalk. Right ovary Measurements: 1.6 x 0.8 x 1.6 cm = volume: 1 mL. Normal appearance/no adnexal mass. Left ovary The left ovary was not visualized. Other findings: No abnormal free fluid. Incidentally noted are Foley urinary catheter and rectal catheter. IMPRESSION: 1. A 2.0 cm mass within the endometrial cavity is favored to represent an endometrial polyp. 2. Normal uterus and right ovary. The left ovary was not visualized. Electronically Signed   By: Ileana Roup M.D.   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 Have informed daughter over the phone. Please see progress note from 06/03/21 regarding peg related discussion pertaining to risks, etc. At this time, will sign off pending patient clinical improvement. Please contact us back as clinical status improves for sedation with improved respiratory status, white count. And can plan for peg placement. Discussed with hospitalist Overall, prognosis is guarded. Poor palliative performance score.  Palliative Care is also following for ongoing goals of care discussion  I personally performed the service.  Management of other medical comorbidities as per primary team  Thank you for allowing [place] to participate in this patient's care. Please don't hesitate to call if any questions or concerns arise.   Annamaria Helling, DO [facility] Gastroenterology  Portions of the record may have been created with voice recognition software. Occasional wrong-word or 'sound-a-like' substitutions may have occurred due to the inherent limitations of voice recognition software.  Read the chart carefully and recognize, using context, where substitutions may have occurred.

## 2021-06-04 NOTE — Progress Notes (Signed)
OT Cancellation Note  Patient Details Name: Desiree Mitchell MRN: [id number] DOB: Sep 20, 1955   Cancelled Treatment:    Reason Eval/Treat Not Completed: Patient not medically ready. Per discussion with RN & PT, pt with elevated HR and respiratory rate this morning. RN recommending therapy hold at this time. OT to re-attempt at later time/date as able.   Fredirick Maudlin, OTR/L [place]

## 2021-06-04 NOTE — Consult Note (Addendum)
Reason for Consult:New onset vaginal bleeding Referring Physician: Dr. Fritzi Mandes  Desiree Mitchell is an 65 y.o. female.  HPI: Her nurse Rodman Key reports that this morning she was found by nursing and their teacher to of had a large volume of dark red vaginal bleeding.He reports that the bleeding saturated her bed sheets and a large cotton bed pad. He did not see the blood because it had been already put into laundry but he was told it was very dark in color. Since then she has had a urine wick in place. There has not been observed vaginal bleeding. A small amount of blood stains the wick on exam. She has had a pelvic US today which showed a  1.2 x 2.0 x 1.7 cm echogenic mass with vascular stalk inside the uterus.  She has had a complicated hospital course so far after being found unresponsive at home. She is currently not able to communicate although occasionally she will say one word answers and seems to understand what is being said to her.   I am unable to find in her chart any gynecological records such as pap smears or prior pelvic US.    History reviewed. No pertinent past medical history.  Past Surgical History:  Procedure Laterality Date   SHUNT REMOVAL Left 05/21/2021   Procedure: SHUNT REMOVAL;  Surgeon: Meade Maw, MD;  Location: [facility] ORS;  Service: Neurosurgery;  Laterality: Left;   TEE WITHOUT CARDIOVERSION N/A 05/29/2021   Procedure: TRANSESOPHAGEAL ECHOCARDIOGRAM (TEE);  Surgeon: Corey Skains, MD;  Location: [facility] ORS;  Service: Cardiovascular;  Laterality: N/A;   VENTRICULOSTOMY Left 05/21/2021   Procedure: VENTRICULOSTOMY;  Surgeon: Meade Maw, MD;  Location: [facility] ORS;  Service: Neurosurgery;  Laterality: Left;    History reviewed. No pertinent family history.  Social History:  has no history on file for tobacco use, alcohol use, and drug use.  Allergies: No Known Allergies  Medications: I have reviewed the patient's current medications.  Results for  orders placed or performed during the hospital encounter of 05/21/21 (from the past 48 hour(s))  Glucose, capillary     Status: Abnormal   Collection Time: 06/03/21 12:19 AM  Result Value Ref Range   Glucose-Capillary 148 (H) 70 - 99 mg/dL    Comment: Glucose reference range applies only to samples taken after fasting for at least 8 hours.  Glucose, capillary     Status: Abnormal   Collection Time: 06/03/21  5:14 AM  Result Value Ref Range   Glucose-Capillary 134 (H) 70 - 99 mg/dL    Comment: Glucose reference range applies only to samples taken after fasting for at least 8 hours.   Comment 1 Notify RN   Glucose, capillary     Status: Abnormal   Collection Time: 06/03/21  9:05 AM  Result Value Ref Range   Glucose-Capillary 131 (H) 70 - 99 mg/dL    Comment: Glucose reference range applies only to samples taken after fasting for at least 8 hours.  Glucose, capillary     Status: Abnormal   Collection Time: 06/03/21 11:47 AM  Result Value Ref Range   Glucose-Capillary 141 (H) 70 - 99 mg/dL    Comment: Glucose reference range applies only to samples taken after fasting for at least 8 hours.  Glucose, capillary     Status: Abnormal   Collection Time: 06/03/21  4:40 PM  Result Value Ref Range   Glucose-Capillary 111 (H) 70 - 99 mg/dL    Comment: Glucose reference range applies only  to samples taken after fasting for at least 8 hours.  Glucose, capillary     Status: Abnormal   Collection Time: 06/03/21  8:40 PM  Result Value Ref Range   Glucose-Capillary 134 (H) 70 - 99 mg/dL    Comment: Glucose reference range applies only to samples taken after fasting for at least 8 hours.  Glucose, capillary     Status: Abnormal   Collection Time: 06/03/21 11:33 PM  Result Value Ref Range   Glucose-Capillary 129 (H) 70 - 99 mg/dL    Comment: Glucose reference range applies only to samples taken after fasting for at least 8 hours.  Glucose, capillary     Status: Abnormal   Collection Time: 06/04/21   3:09 AM  Result Value Ref Range   Glucose-Capillary 125 (H) 70 - 99 mg/dL    Comment: Glucose reference range applies only to samples taken after fasting for at least 8 hours.   Comment 1 Notify RN   Glucose, capillary     Status: Abnormal   Collection Time: 06/04/21  4:02 AM  Result Value Ref Range   Glucose-Capillary 132 (H) 70 - 99 mg/dL    Comment: Glucose reference range applies only to samples taken after fasting for at least 8 hours.  CBC     Status: Abnormal   Collection Time: 06/04/21  4:30 AM  Result Value Ref Range   WBC 14.1 (H) 4.0 - 10.5 K/uL   RBC 3.25 (L) 3.87 - 5.11 MIL/uL   Hemoglobin 10.7 (L) 12.0 - 15.0 g/dL   HCT 32.8 (L) 36.0 - 46.0 %   MCV 100.9 (H) 80.0 - 100.0 fL   MCH 32.9 26.0 - 34.0 pg   MCHC 32.6 30.0 - 36.0 g/dL   RDW 13.1 11.5 - 15.5 %   Platelets 325 150 - 400 K/uL   nRBC 0.0 0.0 - 0.2 %    Comment: Performed at [facility], [address], [place], [place] [address]    No results found.  Review of Systems  Constitutional:  Negative for chills and fever.  HENT:  Negative for congestion, hearing loss and sinus pain.   Respiratory:  Negative for cough, shortness of breath and wheezing.   Cardiovascular:  Negative for chest pain, palpitations and leg swelling.  Gastrointestinal:  Negative for abdominal pain, constipation, diarrhea, nausea and vomiting.  Genitourinary:  Negative for dysuria, flank pain, frequency, hematuria and urgency.  Musculoskeletal:  Negative for back pain.  Skin:  Negative for rash.  Neurological:  Negative for dizziness and headaches.  Psychiatric/Behavioral:  Negative for suicidal ideas. The patient is not nervous/anxious.   Blood pressure 128/76, pulse (!) 103, temperature 98.3 F (36.8 C), resp. rate 20, height 5\' 2"  (1.575 m), weight 88.2 kg, SpO2 93 %. Physical Exam Vitals and nursing note reviewed.  Constitutional:      Appearance: She is well-developed. She is obese. She is ill-appearing.  Eyes:      Extraocular Movements: Extraocular movements intact.  Cardiovascular:     Rate and Rhythm: Normal rate.  Pulmonary:     Effort: Respiratory distress present.  Abdominal:     General: Bowel sounds are normal.     Palpations: Abdomen is soft.  Genitourinary:    Comments: Not able to access, patient in bed with limited mobility, currently receiving tube feeding Skin:    General: Skin is warm and dry.     Coloration: Skin is pale.  Neurological:     Mental Status: She is  alert.    Assessment/Plan: 65 yo with postmenopausal bleeding Endometrial lesion possible polyp versus endometrial hyperplasia versus malignancy. Would need evaluation and sampling by hysteroscopy D&C. Discussed with attending Dr. Posey Pronto that our service could likely coordinate this sampling to be performed at the same time as a PEG tube placement which is being considered. It is not certain at this time though when that may occur.  In the event of recurrent severe bleeding she could be given Provera 10 mg three times a day for 5-10 days.  Would avoid IV estrogen at this time given the possibility of endometrial malignancy and recent embolic events.  Patient has a history of breast cancer. The information in her chart regarding this prior diagnosis is limited and if any information is available regarding her estrogen and progesterone receptor status this would be helpful but does not need to limit short term management of acute uterine bleeding.   Thank you for the consult. Please call with any questions. As discussed with Dr. Posey Pronto our team will be available as needed but will not round at this time.    Eugine Bubb R Margrete Delude 06/04/2021, 6:00 PM

## 2021-06-05 ENCOUNTER — Encounter
Admission: EM | Disposition: A | Discharge status: Skilled Nursing Facility | Payer: Self-pay | Source: Home / Self Care | Attending: Internal Medicine

## 2021-06-05 DIAGNOSIS — I639 Cerebral infarction, unspecified: Secondary | ICD-10-CM | POA: Diagnosis not present

## 2021-06-05 DIAGNOSIS — A419 Sepsis, unspecified organism: Secondary | ICD-10-CM | POA: Diagnosis not present

## 2021-06-05 DIAGNOSIS — N179 Acute kidney failure, unspecified: Secondary | ICD-10-CM | POA: Diagnosis not present

## 2021-06-05 DIAGNOSIS — J9601 Acute respiratory failure with hypoxia: Secondary | ICD-10-CM | POA: Diagnosis not present

## 2021-06-05 LAB — GLUCOSE, CAPILLARY
Glucose-Capillary: 131 mg/dL — ABNORMAL HIGH (ref 70–99)
Glucose-Capillary: 123 mg/dL — ABNORMAL HIGH (ref 70–99)
Glucose-Capillary: 122 mg/dL — ABNORMAL HIGH (ref 70–99)
Glucose-Capillary: 126 mg/dL — ABNORMAL HIGH (ref 70–99)
Glucose-Capillary: 126 mg/dL — ABNORMAL HIGH (ref 70–99)
Glucose-Capillary: 127 mg/dL — ABNORMAL HIGH (ref 70–99)

## 2021-06-05 SURGERY — INSERTION, PEG TUBE
Anesthesia: General

## 2021-06-05 NOTE — Progress Notes (Signed)
   06/04/21 0840  Assess: MEWS Score  Temp 98.8 F (37.1 C)  BP (!) 167/87  Pulse Rate (!) 110  Resp (!) 28  SpO2 97 %  O2 Device Room Air  Assess: MEWS Score  MEWS Temp 0  MEWS Systolic 0  MEWS Pulse 1  MEWS RR 2  MEWS LOC 0  MEWS Score 3  MEWS Score Color Yellow  Assess: if the MEWS score is Yellow or Red  Were vital signs taken at a resting state? Yes  Focused Assessment No change from prior assessment  Does the patient meet 2 or more of the SIRS criteria? No  MEWS guidelines implemented *See Row Information* Yes  Treat  MEWS Interventions Escalated (See documentation below)  Take Vital Signs  Increase Vital Sign Frequency  Yellow: Q 2hr X 2 then Q 4hr X 2, if remains yellow, continue Q 4hrs  Escalate  MEWS: Escalate Yellow: discuss with charge nurse/RN and consider discussing with provider and RRT  Notify: Charge Nurse/RN  Name of Charge Nurse/RN Notified Teresa,RN  Date Charge Nurse/RN Notified 06/04/21  Time Charge Nurse/RN Notified 0840  Notify: Provider  Provider Name/Title  (see previous comments)  Document  Patient Outcome Stabilized after interventions  Progress note created (see row info) Yes  Assess: SIRS CRITERIA  SIRS Temperature  0  SIRS Pulse 1  SIRS Respirations  1  SIRS WBC 1  SIRS Score Sum  3  Inserted for [employer] LPN

## 2021-06-05 NOTE — Progress Notes (Signed)
[place] [facility] [place] NAME: Desiree Mitchell    MR#:  [id number]  DATE OF BIRTH:  Dec 05, 1955  SUBJECTIVE:  patient more awake today. She leans her head towards the right. Able to move her right upper lower extremity and left lower extremity. Very weak on left upper extremity.  TF going on well More awake and alert today. Pt tells me she wants to drink.water. REVIEW OF SYSTEMS:   Review of Systems  Unable to perform ROS: Medical condition  Tolerating Diet:no--NG tube Tolerating PT: pending  DRUG ALLERGIES:  No Known Allergies  VITALS:  Blood pressure (!) 155/75, pulse 84, temperature 98 F (36.7 C), temperature source Oral, resp. rate 16, height 5\' 2"  (1.575 m), weight 88.9 kg, SpO2 100 %.  PHYSICAL EXAMINATION:   Physical Exam  GENERAL:  65 y.o.-year-old patient lying in the bed with no acute distress. obese HEENT: Head atraumatic, normocephalic. Oropharynx and nasopharynx clear. NG+ LUNGS: coarse breath sounds bilaterally, no wheezing, rales, rhonchi. No use of accessory muscles of respiration.  CARDIOVASCULAR: S1, S2 normal. No murmurs, rubs, or gallops.  ABDOMEN: Soft, nontender, nondistended. Bowel sounds present.  EXTREMITIES: No cyanosis, clubbing or edema b/l.    NEUROLOGIC: left hemiparesis PSYCHIATRIC:  patient is alert on VC SKIN:  Pressure Injury 05/22/21 Buttocks Right Deep Tissue Pressure Injury - Purple or maroon localized area of discolored intact skin or blood-filled blister due to damage of underlying soft tissue from pressure and/or shear. (Active)  05/22/21 0300  Location: Buttocks  Location Orientation: Right  Staging: Deep Tissue Pressure Injury - Purple or maroon localized area of discolored intact skin or blood-filled blister due to damage of underlying soft tissue from pressure and/or shear.  Wound Description (Comments):   Present on Admission: Yes     Pressure Injury 05/22/21 Coccyx Stage 2 -  Partial  thickness loss of dermis presenting as a shallow open injury with a red, pink wound bed without slough. (Active)  05/22/21 0300  Location: Coccyx  Location Orientation:   Staging: Stage 2 -  Partial thickness loss of dermis presenting as a shallow open injury with a red, pink wound bed without slough.  Wound Description (Comments):   Present on Admission: Yes     Pressure Injury 05/22/21 Cervical Deep Tissue Pressure Injury - Purple or maroon localized area of discolored intact skin or blood-filled blister due to damage of underlying soft tissue from pressure and/or shear. (Active)  05/22/21 0300  Location: Cervical  Location Orientation:   Staging: Deep Tissue Pressure Injury - Purple or maroon localized area of discolored intact skin or blood-filled blister due to damage of underlying soft tissue from pressure and/or shear.  Wound Description (Comments):   Present on Admission: Yes     Pressure Injury 06/05/21 Buttocks Left Unstageable - Full thickness tissue loss in which the base of the injury is covered by slough (yellow, tan, gray, green or brown) and/or eschar (tan, brown or black) in the wound bed. Long curved wound on whole left (Active)  06/05/21 0622  Location: Buttocks  Location Orientation: Left  Staging: Unstageable - Full thickness tissue loss in which the base of the injury is covered by slough (yellow, tan, gray, green or brown) and/or eschar (tan, brown or black) in the wound bed.  Wound Description (Comments): Long curved wound on whole left buttocks. black yellow red pink and bloody  Present on Admission:         LABORATORY PANEL:  CBC  Recent Labs  Lab 06/04/21 0430  WBC 14.1*  HGB 10.7*  HCT 32.8*  PLT 325     Chemistries  Recent Labs  Lab 05/31/21 0338 06/01/21 0443 06/02/21 0509  NA 137   < > 138  K 4.5   < > 4.7  CL 102   < > 101  CO2 27   < > 30  GLUCOSE 139*   < > 126*  BUN 19   < > 22  CREATININE 0.79   < > 0.75  CALCIUM 8.0*   < > 8.0*   MG 2.2  --   --    < > = values in this interval not displayed.    Cardiac Enzymes No results for input(s): TROPONINI in the last 168 hours. RADIOLOGY:  DG Chest Port 1 View  Result Date: 06/04/2021 CLINICAL DATA:  SOB (shortness of breath) R06.02 (ICD-10-CM) EXAM: PORTABLE CHEST 1 VIEW COMPARISON:  May 24, 2021. FINDINGS: Similar positioning of a right subclavian approach Port-A-Cath with the tip projecting near the superior cavoatrial junction. Gastric tube courses below the diaphragm in outside the field of view. Low lung volumes. Mildly elevated left hemidiaphragm. Mild increase in streaky left basilar opacities. No visible pleural effusions or pneumothorax. Similar cardiomediastinal silhouette. Right axillary clips. IMPRESSION: Mild increase in streaky left basilar opacities, most likely atelectasis. Infection or aspiration is not excluded. Electronically Signed   By: Margaretha Sheffield M.D.   On: 06/04/2021 19:32   ASSESSMENT AND PLAN:   Desiree Mitchell is a 65 y.o. 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 She had extensive skin bruising and also a wound on post neck where her head had rested against a basket. Also has multiple skin lesions and was apparently infested with bedbugs and cockroaches on admission.  Severe sepsis without shock due to bacteremia from her skin on legs as well as posterior neck present on admission -- ID consult appreciated. Will need linezolid for 14 days for gram-positive coverage given soft tissue infection (last dose 10/26) -- IV Rocephin for Proteus infection in the blood for 10 days (10/10) -- Dr. Ola Spurr recommends continue monitor signs of infection after stopping antibiotics -- unable to do TEE secondary to esophageal stricture--- low risk for endocarditis per ID rec assessment-- continue above antibiotics  VA shunt malfunction status post EVD placement on 10/11 by Dr.  Lacinda Axon neurosurgery -- patient improving neurology currently. Repeat CT head stable -- removed 10/19 at bedside per Dr. Lacinda Axon -- CSF cultures negative --ok to start DVT proph per neurology--now on hold for PEG placement  posterior neck, buttock, coccyx pressure injuries secondary to being found down at home for unknown amount of time -- continue wound care per wound consult  rhabdomyolysis secondary to hypovolemia in the setting of dehydration -- present on admission now improved  acute respiratory failure with hypoxia  in the postoperative setting after neurological be a shunt requiring mechanical ventilator -- now extubated -- sats hundred percent on 2 L--wean to RA -10/24-- chest x-ray pending. Patient has increased respiratory rate and some gurgling sound. Worried about aspiration.  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 Recommends continue NG tube feeding. Speech will follow on daily basis. Discussed with daughter that if patient continues to show signs of aspiration will need to consider other option for feeding  like PEG -- continue NG tube feeding for now--will consult GI for PEG placement --10/22-- G.I. consultation for peg placement evaluation. Discussed with daughter Lauro Regulus she is in agreement with plan -10/23--PEG on Next Tuesday.hold lovenox mon/tue -10/24--PEG placement on hold Dr Virgina Jock signed off due to increased RR, fever and overall not stable for procedure. CXR --no acute infection--chronic changes --10/25--Dr Volney American to place PEG on Friday if vitals remains stable  vaginal bleeding-- noted per staff -- no history of  recent vaginal bleeding per daughter -- pelvic ultrasound showed Endometrial polyp with vascular stalk could be the reason for bleeding --curbsided Gyn (dr Glennon Mac)  different options given-- history of scope he and removal of polyp versus placement of IUD versus IV estrogen versus oral Provera. -- Given multiple medical issues with recent stroke will at present continue to monitor her hemoglobin and amount of bleeding. If she continues and hemoglobin drops then will consult GYN formally. -- Given her new stroke will avoid hormone replacement at present unless deemed necessary --10/24--GYN consulted. Pt continues to have vaginal bleed. Will HOLD LOVENOX for now. --10/25-- Seen by Dr Gardiner Rhyme-- per her In the event of recurrent severe bleeding she could be given Provera 10 mg three times a day for 5-10 days.  --Dr Gardiner Rhyme to discuss with pt's dter about the hysteroscopy and biopsy to r/o malignancy if dter would like to pursue it further  Spoke with daughter at length today. She does want to pursue with peg tube placement when patient is stable. She does understand patient is critically ill with multiple medical problems and overall has a poor prognosis  Palliative care to follow up with dter today   Procedures:EVD shunt removal Family communication :[name] on the phone 10/24 Consults :ID,cards,neurosurgery. GI, ob-gyn CODE STATUS: FULL DVT Prophylaxis :SCD Level of care: Med-Surg Status is: Inpatient  Remains inpatient appropriate because: multiple comorbidities and slow improvement of above mentioned issues  TOTAL TIME TAKING CARE OF THIS PATIENT: 25 minutes.  >50% time spent on counselling and coordination of care  Note: This dictation was prepared with Dragon dictation along with smaller phrase technology. Any transcriptional errors that result from this process are unintentional.  Fritzi Mandes M.D    [facility]   CC: Primary care physician; Pcp, No Patient ID: Desiree Mitchell, female   DOB: 1956-02-15, 65 y.o.   MRN: [id number]

## 2021-06-05 NOTE — TOC Progression Note (Signed)
Transition of Care [facility]) - Progression Note    Patient Details  Name: Desiree Mitchell MRN: [id number] Date of Birth: 09/11/1955  Transition of Care [facility]) CM/SW Contact  Shelbie Hutching, RN Phone Number: 06/05/2021, 9:46 AM  Clinical Narrative:    TOC following, patient transferred to 1C from 1A yesterday.  Patient will not go for Peg tube today and not clinically stable.     Expected Discharge Plan: Long Term Acute Care (LTAC) Barriers to Discharge: Continued Medical Work up  Expected Discharge Plan and Services Expected Discharge Plan: Long Term Acute Care (LTAC) In-house Referral: Clinical Social Work   Post Acute Care Choice: Long Term Acute Care (LTAC) Living arrangements for the past 2 months: Single Family Home                                       Social Determinants of Health (SDOH) Interventions    Readmission Risk Interventions No flowsheet data found.

## 2021-06-05 NOTE — Consult Note (Signed)
WOC has seen this patient just a few days after admission, wound care and topical care for skin breakdown ordered. Updated Santyl orders for both left and right ischial wounds. Would be concerned some of the deep tissue pressure injuries from being on the toilet and the neck against hard object will eventually evolve to unstageable pressure injuries.  Beside nurses will notify [facility] nursing team if addition review/updates to orders needed.   [place], [place], [place]

## 2021-06-05 NOTE — Progress Notes (Signed)
Occupational Therapy Treatment Patient Details Name: Kollins Fenter MRN: [id number] DOB: 1955/11/29 Today's Date: 06/05/2021   History of present illness Pt is a 65 y.o. female presenting to hospital 05/21/21 with AMS (found unresponsive by family).  Significant pressure sore present back of neck, buttocks, and coccyx.  Pt noted to be infested with bed bugs and roaches.  Pt admitted with acute respiratory failure, rhabdomyolysis, metabolic acidosis, acute kidney injury, sepsis, VA shunt malfunction, transaminitis, and posterior neck/buttocks/coccyx pressure injuries.  S/p removal ventriculoatrial shunt and placement of L parietal approach ventriculostomy 05/22/21; extubated 10/11; MRI brain 10/14 showing few scattered small acute infarcts involving different vascular territories (watershed type infarcts); TEE 10/18; and EVD removed 10/19.    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   OT comments  Pt seen for OT/PT co- treatment on this date. Upon arrival to room, pt awake and seated upright in bed. Pt reporting no pain and agreeable to tx. Pt alert and oriented to at least name, requiring increased time and multimodal cues to follow 1-step instructions. Pt currently presents with decreased cognition, decreased strength, and decreased activity tolerance. Due to these functional impairments, pt requires MAX A for bed-level LB dressing, MAX A+2 for bed mobility, and MAX A for seated grooming tasks at EOB. Pt able to tolerate sitting EOB for 15 mins, requiring MOD/MAX A for initial static sitting balance, however able to progress to CGA with unilateral UE support from bedrail. Pt engaged in seated trunk/UE exercises to improve static sitting balance in preparation for seated UB ADLs at EOB. Pt is making good progress toward goals and continues to benefit from skilled OT services to maximize return to PLOF and minimize risk of future  falls, injury, caregiver burden, and readmission. Will continue to follow POC. Discharge recommendation remains appropriate.     Recommendations for follow up therapy are one component of a multi-disciplinary discharge planning process, led by the attending physician.  Recommendations may be updated based on patient status, additional functional criteria and insurance authorization.    Follow Up Recommendations  Skilled nursing-short term rehab (<3 hours/day)    Assistance Recommended at Discharge Frequent or constant Supervision/Assistance  Equipment Recommendations  Other (comment) (defer to next venue of care)       Precautions / Restrictions Precautions Precautions: Fall Precaution Comments: Aspiration; gastric tube; rectal tube; R chest port; posterior neck, buttocks, and coccyx pressure injuries Restrictions Weight Bearing Restrictions: No Other Position/Activity Restrictions: Per MD Posey Pronto 06/01/21 pt cleared for therapy and OOB mobility (not on bedrest anymore)       Mobility Bed Mobility Overal bed mobility: Needs Assistance Bed Mobility: Supine to Sit;Sit to Supine     Supine to sit: Max assist;+2 for physical assistance Sit to supine: Max assist;+2 for physical assistance   General bed mobility comments: assist for trunk and B LE's; use of bed sheet to prevent sheering on pt's bottom; vc's for technique    Transfers Overall transfer level: Needs assistance Equipment used: None Transfers: Lateral/Scoot Transfers            Lateral/Scoot Transfers: Max assist;Total assist;+2 physical assistance General transfer comment: lateral scooting towards L side using bed sheet under pt's bottom to assist with moving; vc's for technique; x2 trials     Balance Overall balance assessment: Needs assistance Sitting-balance support: Bilateral upper extremity supported;Feet supported Sitting balance-Leahy Scale: Poor Sitting balance - Comments: initially max assist progressing  to mod  assist (d/t posterior lean) but able to progress to CGA at times with pt's R UE holding onto bottom bedrail; vc's for technique Postural control: Posterior lean     Standing balance comment: standing not appropriate at this time                           ADL either performed or assessed with clinical judgement   ADL Overall ADL's : Needs assistance/impaired     Grooming: Wash/dry face;Maximal assistance;Sitting Grooming Details (indicate cue type and reason): With HHA, pt requires MAX A to wash face with washcloth             Lower Body Dressing: Maximal assistance;Bed level Lower Body Dressing Details (indicate cue type and reason): to don/doff socks                      Cognition Arousal/Alertness: Awake/alert Behavior During Therapy: Flat affect Overall Cognitive Status: No family/caregiver present to determine baseline cognitive functioning                                 General Comments: Oriented to at least name; inconsistent with verbalizing information (limited words spoken at a time); increased time to respond and initiate movement                General Comments abrasions noted B LE's and L hand    Pertinent Vitals/ Pain       Pain Assessment: No/denies pain Faces Pain Scale: No hurt Pain Intervention(s): Limited activity within patient's tolerance;Monitored during session;Repositioned         Frequency  Min 2X/week        Progress Toward Goals  OT Goals(current goals can now be found in the care plan section)  Progress towards OT goals: Progressing toward goals  Acute Rehab OT Goals Time For Goal Achievement: 06/15/21 Potential to Achieve Goals: [place] Discharge plan remains appropriate;Frequency remains appropriate    Co-evaluation    PT/OT/SLP Co-Evaluation/Treatment: Yes Reason for Co-Treatment: Complexity of the patient's impairments (multi-system involvement);For patient/therapist  safety PT goals addressed during session: Mobility/safety with mobility OT goals addressed during session: ADL's and self-care      AM-PAC OT "6 Clicks" Daily Activity     Outcome Measure   Help from another person eating meals?: A Lot Help from another person taking care of personal grooming?: A Lot Help from another person toileting, which includes using toliet, bedpan, or urinal?: Total (FMS/foley) Help from another person bathing (including washing, rinsing, drying)?: A Lot Help from another person to put on and taking off regular upper body clothing?: A Lot Help from another person to put on and taking off regular lower body clothing?: Total 6 Click Score: 10    End of Session Equipment Utilized During Treatment: Oxygen  OT Visit Diagnosis: Muscle weakness (generalized) (M62.81);Adult, failure to thrive (R62.7);Other symptoms and signs involving cognitive function   Activity Tolerance Patient tolerated treatment well   Patient Left in bed;with call bell/phone within reach;with bed alarm set   Nurse Communication Mobility status        Time: 1035-1110 OT Time Calculation (min): 35 min  Charges: OT General Charges $OT Visit: 1 Visit OT Treatments $Self Care/Home Management : 8-22 mins  Fredirick Maudlin, OTR/L [place]

## 2021-06-05 NOTE — Progress Notes (Signed)
Physical Therapy Treatment Patient Details Name: Desiree Mitchell MRN: [id number] DOB: 09/07/1955 Today's Date: 06/05/2021   History of Present Illness Pt is a 65 y.o. female presenting to hospital 05/21/21 with AMS (found unresponsive by family).  Significant pressure sore present back of neck, buttocks, and coccyx.  Pt noted to be infested with bed bugs and roaches.  Pt admitted with acute respiratory failure, rhabdomyolysis, metabolic acidosis, acute kidney injury, sepsis, VA shunt malfunction, transaminitis, and posterior neck/buttocks/coccyx pressure injuries.  S/p removal ventriculoatrial shunt and placement of L parietal approach ventriculostomy 05/22/21; extubated 10/11; MRI brain 10/14 showing few scattered small acute infarcts involving different vascular territories (watershed type infarcts); TEE 10/18; and EVD removed 10/19.    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    PT Comments    Pt resting in bed upon therapy arrival; agreeable to PT/OT co-session.  Max assist x2 for bed mobility; sitting balance (d/t posterior lean) initially max assist progressing to mod assist with CGA at times (with pt's R UE holding onto bedrail).  Able to sit on edge of bed for 15 minutes with focus on sitting balance, weight shifting L/R in sitting, OT activities, and upright head position (pt keeping head down in forward flexed cervical position requiring cueing and assist to hold head up).  Pt assisted back to bed end of session and repositioned for comfort and to prevent skin breakdown.  Will continue to focus on strengthening and progressive functional mobility per pt tolerance.   Recommendations for follow up therapy are one component of a multi-disciplinary discharge planning process, led by the attending physician.  Recommendations may be updated based on patient status, additional functional criteria and insurance  authorization.  Follow Up Recommendations  Skilled nursing-short term rehab (<3 hours/day)     Assistance Recommended at Discharge    Equipment Recommendations  Other (comment) (TBD at next facility)    Recommendations for Other Services OT consult     Precautions / Restrictions Precautions Precautions: Fall Precaution Comments: Aspiration; gastric tube; rectal tube; R chest port; posterior neck, buttocks, and coccyx pressure injuries Restrictions Weight Bearing Restrictions: No Other Position/Activity Restrictions: Per MD Posey Pronto 06/01/21 pt cleared for therapy and OOB mobility (not on bedrest anymore)     Mobility  Bed Mobility Overal bed mobility: Needs Assistance Bed Mobility: Supine to Sit;Sit to Supine     Supine to sit: Max assist;+2 for physical assistance Sit to supine: Max assist;+2 for physical assistance   General bed mobility comments: assist for trunk and B LE's; use of bed sheet to prevent sheering on pt's bottom; vc's for technique    Transfers Overall transfer level: Needs assistance Equipment used: None Transfers: Lateral/Scoot Transfers            Lateral/Scoot Transfers: Max assist;Total assist;+2 physical assistance General transfer comment: lateral scooting towards L side using bed sheet under pt's bottom to assist with moving; vc's for technique; x2 trials    Ambulation/Gait             General Gait Details: not appropriate at this time   Stairs             Wheelchair Mobility    Modified Rankin (Stroke Patients Only)       Balance Overall balance assessment: Needs assistance Sitting-balance support: Bilateral upper extremity supported;Feet supported   Sitting balance - Comments: initially max assist progressing to mod assist (d/t posterior lean) but able  to progress to CGA at times with pt's R UE holding onto bottom bedrail; vc's for technique Postural control: Posterior lean     Standing balance comment: standing  not appropriate at this time                            Cognition Arousal/Alertness: Awake/alert Behavior During Therapy: Flat affect Overall Cognitive Status: No family/caregiver present to determine baseline cognitive functioning                                 General Comments: Oriented to at least name; inconsistent with verbalizing information (limited words spoken at a time); increased time to respond and initiate movement        Exercises  Weight-shifting L/R (onto L or R elbow respectively) in sitting x2 reps each side with assist and cueing for technique.    General Comments General comments (skin integrity, edema, etc.): abrasions noted B LE's and L hand.  Nursing cleared pt for participation in physical therapy.  Pt agreeable to PT session.      Pertinent Vitals/Pain Pain Assessment: No/denies pain Faces Pain Scale: No hurt Pain Intervention(s): Limited activity within patient's tolerance;Monitored during session;Repositioned Vitals (HR and O2 on 2 L via nasal cannula) stable and WFL throughout treatment session.    Home Living                          Prior Function            PT Goals (current goals can now be found in the care plan section) Acute Rehab PT Goals Patient Stated Goal: pt did not state any goals PT Goal Formulation: Patient unable to participate in goal setting Time For Goal Achievement: 06/15/21 Potential to Achieve Goals: Fair Progress towards PT goals: Progressing toward goals    Frequency    Min 2X/week      PT Plan Current plan remains appropriate    Co-evaluation PT/OT/SLP Co-Evaluation/Treatment: Yes Reason for Co-Treatment: Complexity of the patient's impairments (multi-system involvement);For patient/therapist safety;To address functional/ADL transfers;Necessary to address cognition/behavior during functional activity PT goals addressed during session: Mobility/safety with  mobility;Balance OT goals addressed during session: ADL's and self-care      AM-PAC PT "6 Clicks" Mobility   Outcome Measure  Help needed turning from your back to your side while in a flat bed without using bedrails?: Total Help needed moving from lying on your back to sitting on the side of a flat bed without using bedrails?: Total Help needed moving to and from a bed to a chair (including a wheelchair)?: Total Help needed standing up from a chair using your arms (e.g., wheelchair or bedside chair)?: Total Help needed to walk in hospital room?: Total Help needed climbing 3-5 steps with a railing? : Total 6 Click Score: 6    End of Session Equipment Utilized During Treatment: Oxygen Activity Tolerance: Patient limited by fatigue Patient left: in bed;with call bell/phone within reach;with bed alarm set;Other (comment) (SLP present) Nurse Communication: Mobility status;Precautions PT Visit Diagnosis: Other abnormalities of gait and mobility (R26.89);Muscle weakness (generalized) (M62.81);Difficulty in walking, not elsewhere classified (R26.2)     Time: 1610-9604 PT Time Calculation (min) (ACUTE ONLY): 34 min  Charges:  $Therapeutic Activity: 8-22 mins  Leitha Bleak, PT 06/05/21, 1:50 PM

## 2021-06-05 NOTE — Progress Notes (Signed)
Palliative Care Progress Note, Assessment & Plan   Patient Name: Desiree Mitchell       Date: 06/05/2021 DOB: 12-29-1955  Age: 65 y.o. MRN#: [id number] Attending Physician: Fritzi Mandes, MD Primary Care Physician: Pcp, No Admit Date: 05/21/2021  Reason for Consultation/Follow-up: Establishing goals of care  Subjective: Pt is resting in bed in mild respiratory distress.  I can hear her secretions.  She is coughing but it is not strong and not productive.  She feels warm to touch.  She endorses pain in her left hand.  She is alert and oriented.  She is leaning to her left side but declines to be repositioned.  HPI: 65 y.o. 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     Patient underwent a removal of shunt and placement of EVD.  Patient was successfully extubated on 10/11.  Neurology is consulted for multiple infarcts.  Gynecology has been consulted for abnormal uterine bleeding.  She has multiple pressure injuries and wound ostomy team's been consulted.  PMT was consulted for [place] discussion.  I spoke with the patient at length at bedside today.  When asked what her goal of care is she says she just wants to get better.  Asked what better looks like for her.  She had no response.  I shared my concerns regarding her diagnosis, prognosis, and overall quality of life at this point.   Artificial nutrition via an NG tube versus a PEG tube was discussed.  Patient reports that she has had a PEG tube in the past.  She says it made her feel better.  She says that she would like a PEG tube if needed.  CODE STATUS was addressed  with the patient.  I outlined that as it stands right now should her lungs stop working that we would evaluate for the need for a breathing tube and a ventilator.  I shared my concerns that she is having increased difficulty clearing her secretions as well as increased work of breathing.  During our discussion she had 1 episode of difficulty coughing.  Charge nurse came to evaluate at bedside and patient was able to clear secretions independently.  Educated the patient that the secretions need to be cleared or they we will continue to cause her difficulty in clearing her airway and expanding her lungs.  She said she had had a ventilator 3 times in the past and that she would not want 1 again.  I described that should she not be intubated that that does not mean we would stop care but rather do all interventions up until intubating her to ensure her comfort.  She again stated that she would not want to have a tube down her throat.  I shared that in addition to not wanting a tube down her throat or a breathing tube/ventilator that we need to discuss what would happen in the event of a cardiopulmonary arrest.  I described that as it stands right now should her heart stop working and she physically passes away that we would  call a CODE BLUE, attempt cardiopulmonary resuscitation, use cardioversion if needed, and use all medically available and appropriate means to ATTEMPT to restart her heart.  I outlined that this might cause more trauma than benefit.  I reviewed the risks and benefits of cardiopulmonary resuscitation.  The patient did not seem as opinionated about this portion of her CODE STATUS.  She was very clear and concise when discussing a ventilator.  She did not give a definitive answer in regards to CPR.  I shared that I will be changing her CODE STATUS from full code to partial code.  I outlined that this means we will do all medically appropriate therapies should her heart and lungs stop up until  intubation and ventilation.  She said yes.  Dr. Posey Pronto and bedside nurse notified of change in [place].  I outlined that this could change in CODE STATUS does not change her plan of care.  We will continue to treat the treatable.  We will continue to address her abnormal uterine bleeding, neurological function, respiratory status, and functional and nutritional needs.  I asked if she wanted me to speak with her daughter Desiree Mitchell.  She said she wasn't sure.  She shared that her brother Desiree Mitchell ot Desiree Mitchell and she said they may not be "as rational as Desiree Mitchell,.  I asked her if she wanted me to speak with Desiree Mitchell and she said neither yes nor no.  I will defer having further conversations with Desiree Mitchell until I round on the patient again tomorrow.  Discussed with patient the importance of continued conversation with family and the medical providers regarding overall plan of care and treatment options, ensuring decisions are within the context of the patient's values and GOCs.    Questions and concerns were addressed. The family was encouraged to call with questions or concerns.   Code Status: Limited code  Prognosis:  Unable to determine  Discharge Planning: To Be Determined  Recommendations/Plan: DNI Treat the treatable  Care plan was discussed with Dr. Posey Pronto, nursing, patient  Physical Exam HENT:     Mouth/Throat:     Mouth: Mucous membranes are moist.  Cardiovascular:     Rate and Rhythm: Normal rate.  Pulmonary:     Effort: No respiratory distress.     Comments: Congested breathing, no wheezing noted, increased WOB Abdominal:     Palpations: Abdomen is soft.  Skin:    General: Skin is warm.  Neurological:     Mental Status: She is alert. Mental status is at baseline.  Psychiatric:        Behavior: Behavior normal.        Thought Content: Thought content normal.            Flowsheet Rows    Flowsheet Row Most Recent Value  Intake Tab   Referral Department Hospitalist  Unit at  Time of Referral ICU  Palliative Care Primary Diagnosis Neurology  Date Notified 05/24/21  Palliative Care Type New Palliative care  Reason for referral Clarify Goals of Care  Date of Admission 05/21/21  Date first seen by Palliative Care 05/24/21  # of days Palliative referral response time 0 Day(s)  # of days IP prior to Palliative referral 3  Clinical Assessment   Palliative Performance Scale Score 20%  Pain Max last 24 hours Not able to report  Pain Min Last 24 hours Not able to report  Dyspnea Max Last 24 Hours Not able to report  Dyspnea Min Last 24 hours Not  able to report  Psychosocial & Spiritual Assessment   Palliative Care Outcomes         Total Time 35 minutes Prolonged Time Billed  no   Greater than 50%  of this time was spent counseling and coordinating care related to the above assessment and plan.  Thank you for allowing the Palliative Medicine Team to assist in the care of this patient.  [place] Ilsa Iha, FNP-BC Palliative Medicine Team Team Phone # [phone]

## 2021-06-05 NOTE — Progress Notes (Signed)
Discussed care plan with daughter, [name]. Concern involves recent vaginal bleeding noted. Pt has been menopausal, and recently in hospital noted to have bleeding.  Daughter is unaware of prior bleeding episodes, if any. Pt has been on blood thinner medicine this admission, not prior to this admission, as far as daughter is aware. Counseled that postmenopausal bleeding is concerning for endometrial hyperplasia or cancer, until proven otherwise.  It could be form a benign polyp, or from blood thinner medicine. Proof can be obtained from EMB (endometrial biopsy), or biopsy at [facility] surgery.  Pros and cons of these procedures are discussed.  EMB is the less invasive. If negative, then would not recommend treatment of bleeding at this time other than by progesterone if bleeding persists. If positive, then would seek Gyn Onc consultation for treatment options.  Endometrial cancer and its prognosis is discussed, which is usually quite favorable.  Of course, proper staging and involving Gyn Onc would be critical in getting exact information and prediction.  Will confer with medicine team for best timing to achieve the EMB. If she is having procedure under sedation for other reasons, it seems reasonable to perform at that time.  Barnett Applebaum, MD, [facility], [facility] 06/05/2021  3:46 PM

## 2021-06-05 NOTE — Progress Notes (Addendum)
Speech Language Pathology Treatment: Dysphagia  Patient Details Name: Desiree Mitchell MRN: [id number] DOB: 11-14-1955 Today's Date: 06/05/2021 Time: 9379-0240 SLP Time Calculation (min) (ACUTE ONLY): 12 min  Assessment / Plan / Recommendation Clinical Impression  Pt seen for clinical swallowing re-evaluation. Pt finished with OT/PT upon SLP entrance to room. Pt alert and slow to respond. On room air. Pt with hypophonic, hoarse, and intermittently wet/gurgly vocal quality. Concern for decreased secretion management. Oral care provided by SLP prior to trials. Observed dried, cracked appearance to lingual body.   Pt given trials of ice chips via teaspoon. Pt with s/sx at least a mild oral dysphagia c/b prolonged oral prep and delayed A-P transit. Pt with s/sx of highly suspected pharyngeal dysphagia including duspected delayed swallow initiation, wet/gurgly vocal quality, immediate/prolonged cough, increased WOB and multiple swallows.   A safe oral diet cannot be recommended at this time. Pt with limited functional change in clinical presentation compared to previous clinical swallowing evaluation. Recommend NPO with continuation of alternate means of nutrition/hydration/medication.   Pt educated re: results, recommendations, generalized aspiration precautions (e.g. oral care, positioning/HOB with Tfs)  and POC. RN and MD also made aware.   SLP to f/u per POC for clinical swallowing re-evaluation.    HPI HPI: 65 year old female presented at [facility] ED on 05/21/2021. PMHx of subarachnoid hemorrhage s/p drain placement (approximately 2007), breast cancer (port a cath in place), obesity (BMI 34.48) presented on 10/10 with altered mental status found unresponsive by family. Upon arrival imaging completed, including, "CXR 05/21/2021: Mild left basilar atelectasis without evidence of an acute infiltrate  CT head without contrast 05/21/2021: Interval development of mild hydrocephalus and periventricular edema,  associated malpositioned left VA shunt that terminates in the anterior mediastinum and no longer within the left brachiocephalic vein.  Shunt noted to be coiled within the neck.  Groundglass opacity within the right lower lobe and patchy airspace opacity within the left lower lobe." She has a history of prior subarachnoid hemorrhage requiring shunt placement. Resulting in neurosurgery consult. CT head showed increased ventricular size concerning for shunt malfunction. She underwent removal of shunt and placement of EVD on 05/21/21.   Dysphagia Treatment  Temperature Spikes Noted  (Tmax 99.1)  Oral Cavity - Dentition Adequate natural dentition  Respiratory Status Room air  Treatment Methods Upgraded PO texture trial;Patient/caregiver education  Patient observed directly with PO's Yes  Type of PO's observed Ice chips  Feeding Total assist  Liquids provided via Cup  Oral Phase Signs & Symptoms  (oral holding; delayed oral transit)  Pharyngeal Phase Signs & Symptoms Suspected delayed swallow initiation;Wet vocal quality;Immediate cough;Changes in respirations;Audible swallow;Multiple swallows                                SLP Plan  Continue with current plan of care      Recommendations for follow up therapy are one component of a multi-disciplinary discharge planning process, led by the attending physician.  Recommendations may be updated based on patient status, additional functional criteria and insurance authorization.    Recommendations  Diet recommendations: NPO Medication Administration: Via alternative means               Oral Care Recommendations: Oral care BID Follow up Recommendations: 24 hour supervision/assistance (TBD; anticipate need for post-acute SLP services at d/c) SLP Visit Diagnosis: Dysphagia, oropharyngeal phase (R13.12) Plan: Continue with current plan of care  Desiree Mitchell, M.S., [place]  06/05/2021, 1:38 PM

## 2021-06-06 ENCOUNTER — Encounter: Payer: Self-pay | Admitting: Internal Medicine

## 2021-06-06 ENCOUNTER — Other Ambulatory Visit: Payer: Self-pay

## 2021-06-06 DIAGNOSIS — I639 Cerebral infarction, unspecified: Secondary | ICD-10-CM | POA: Diagnosis not present

## 2021-06-06 DIAGNOSIS — J9601 Acute respiratory failure with hypoxia: Secondary | ICD-10-CM | POA: Diagnosis not present

## 2021-06-06 DIAGNOSIS — A419 Sepsis, unspecified organism: Secondary | ICD-10-CM | POA: Diagnosis not present

## 2021-06-06 DIAGNOSIS — M6282 Rhabdomyolysis: Secondary | ICD-10-CM

## 2021-06-06 DIAGNOSIS — G9340 Encephalopathy, unspecified: Secondary | ICD-10-CM | POA: Diagnosis not present

## 2021-06-06 LAB — GLUCOSE, CAPILLARY
Glucose-Capillary: 144 mg/dL — ABNORMAL HIGH (ref 70–99)
Glucose-Capillary: 147 mg/dL — ABNORMAL HIGH (ref 70–99)
Glucose-Capillary: 122 mg/dL — ABNORMAL HIGH (ref 70–99)
Glucose-Capillary: 109 mg/dL — ABNORMAL HIGH (ref 70–99)
Glucose-Capillary: 144 mg/dL — ABNORMAL HIGH (ref 70–99)

## 2021-06-06 MED ORDER — GUAIFENESIN-DM 100-10 MG/5ML PO SYRP: 5.0000 mL | ORAL_SOLUTION | ORAL | Status: DC | PRN

## 2021-06-06 MED ORDER — SODIUM CHLORIDE 0.9 % IV SOLN: INTRAVENOUS | Status: DC

## 2021-06-06 NOTE — Progress Notes (Signed)
Patient alert. Repositioned but refused to face towards the door. Dressing changed on sacral area with wet to dry gauze and mepilex on top. RR is at the 30's . MD Damita Dunnings made aware.

## 2021-06-06 NOTE — Progress Notes (Signed)
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  Patient was last seen normal on 05/19/2021 at 4 PM.  On EMS arrival, patient was noted be hypoxic with SPO2 in the 70s with a normal blood sugar.  EMS with difficulty obtaining blood pressure and patient received 300 mL of LR, 40 mg of lidocaine, 25 mg of Phenergan and an IO was placed in the right humeral head.  Family later reported to EDP that they found her sitting on the toilet slumped with her neck up against a basket.  Of note patient arrived infested with bedbugs and roaches.  In the ED, temperature 91.1 F, HR 95, RR 21, BP 169/123, SPO2 95% on right nonrebreather 15 L.  Sodium 137, potassium 4.8, chloride 97, BUN/creatinine 84/2.1, CO2 17, anion gap 23, AST 155, ALT 59, CK 6755.  WBC 30.3, hemoglobin 15.9, high sensory troponin 22, lactic acid 2.4.  COVID-19 PCR negative.  Influenza A/B PCR negative.  VBG with pH 7.21, PO2 53, PCO2 32.  Chest x-ray with mild left basilar atelectasis without evidence of acute infiltrate.  CT head without contrast interval development of mild hydrocephalus and periventricular edema associated with malpositioned left VA shunt that terminates in the anterior mediastinum and no longer within the left brachiocephalic vein, shunt no to be coiled within the neck, GGO right lower lobe and patchy airspace opacity within the left lower lobe.  CT C-spine with no acute fracture or traumatic injury noted.  Neurosurgery was consulted and patient was taken emergently to the OR by Dr. Izora Ribas for removal of her VA shunt and placement of  external ventricular drain.  Patient was initially admitted to the PCCM service and transferred to hospitalist service on 05/30/2021.   Assessment & Plan:   Principal Problem:   Sepsis ([place]) Active Problems:   Endotracheally intubated   On mechanically assisted ventilation (HCC)   Shunt malfunction   Rhabdomyolysis   AKI (acute kidney injury) ([place])   Increased anion gap metabolic acidosis   Acute encephalopathy   Transaminitis   Pressure injury of skin   Acute embolic stroke [facility])   Palliative care by specialist   Full code status   Severe sepsis, POA Proteus/staph epidermis septicemia Patient was seen by infectious disease, etiology suspected from her chronic wounds.  No evidence of VP shunt infection with negative CSF cultures.  TTE unrevealing.  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 Wandalee Ferdinand low risk for endocarditis.  Completed 10-day course of ceftriaxone and 14-day course of linezolid.  Infectious disease now signed off.  VA shunt malfunction Patient presented to the ED with confusion, CT head with interval development of mild this and periventricular edema associated with malposition left VA shunt.  Patient was taken emergently to the OR by neurosurgery, Dr. Izora Ribas on 10/11 with complete removal of ventricular atrial shunt and placement of left parietal approach ventriculostomy.  External ventriculostomy drain removed by neurosurgery on 05/30/2021.  CSF cultures negative.  Further intervention planned by neurosurgery team at this time. --Continue monitor mental status  Acute hypoxic respiratory failure, postoperative: Resolved Following shunt removal and  EVD placement, patient remained on mechanical ventilation.  Patient now extubated and weaned to room air.  Multifocal strokes Pain with multifocal infarcts noted, concerning for embolic in nature.  Neurology was consulted.  TTE unrevealing.  Unable to undergo TEE  per cardiology due to anatomic abnormalities of epiglottis. --Aspirin 81 mg daily --Palliative care following, overall very poor prognosis.  Pending PEG tube placement with plan SNF thereafter.  Essential hypertension --Toprol tartrate 25 mg twice daily  Low normal B12 --Vitamin B12 level, 214.  Homocysteine level 17.0, within normal limits --s/p IM vitamin B12 supplementation x 3 (10/17 - 10/19) (goal >500) --Repeat B12 levels outpatient by PCP  Rhabdomyolysis Etiology likely secondary to prolonged downtime.  Received IV fluid hydration.  Improved.  Dysphagia --Continue tube feeds --GI plans PEG placement 10/28  GERD: Protonix 40 mg daily  Abnormal uterine bleeding Vaginal ultrasound with endometrial polyp, vascular stalk. --GYN following --Plan endometrial biopsy on 10/28 --Monitor CBC daily --Transfuse for hemoglobin less than 7.0  Posterior neck, buttock, coccyx pressure injuries, POA Etiology likely secondary to being found down for unknown amount of time at home. Pressure Injury 05/22/21 Buttocks Right Deep Tissue Pressure Injury - Purple or maroon localized area of discolored intact skin or blood-filled blister due to damage of underlying soft tissue from pressure and/or shear. (Active)  05/22/21 0300  Location: Buttocks  Location Orientation: Right  Staging: Deep Tissue Pressure Injury - Purple or maroon localized area of discolored intact skin or blood-filled blister due to damage of underlying soft tissue from pressure and/or shear.  Wound Description (Comments):   Present on Admission: Yes     Pressure Injury 05/22/21 Coccyx Stage 2 -  Partial thickness loss of dermis presenting as a shallow open injury with a red, pink wound bed without slough. (Active)  05/22/21 0300  Location: Coccyx  Location Orientation:   Staging: Stage 2 -  Partial thickness loss of dermis presenting as a shallow open injury with a red, pink wound bed without slough.  Wound Description  (Comments):   Present on Admission: Yes     Pressure Injury 05/22/21 Cervical Deep Tissue Pressure Injury - Purple or maroon localized area of discolored intact skin or blood-filled blister due to damage of underlying soft tissue from pressure and/or shear. (Active)  05/22/21 0300  Location: Cervical  Location Orientation:   Staging: Deep Tissue Pressure Injury - Purple or maroon localized area of discolored intact skin or blood-filled blister due to damage of underlying soft tissue from pressure and/or shear.  Wound Description (Comments):   Present on Admission: Yes     Pressure Injury 06/05/21 Buttocks Left Unstageable - Full thickness tissue loss in which the base of the injury is covered by slough (yellow, tan, gray, green or brown) and/or eschar (tan, brown or black) in the wound bed. Long curved wound on whole left (Active)  06/05/21 0622  Location: Buttocks  Location Orientation: Left  Staging: Unstageable - Full thickness tissue loss in which the base of the injury is covered by slough (yellow, tan, gray, green or brown) and/or eschar (tan, brown or black) in the wound bed.  Wound Description (Comments): Long curved wound on whole left buttocks. black yellow red pink and bloody  Present on Admission:   --continue local wound care, offloading    DVT prophylaxis: Place and maintain sequential compression device Start: 06/04/21 1538 SCDs Start: 05/22/21 0156   Code Status: Partial Code Family Communication: No family present at bedside this morning.  Disposition Plan:  Level of care: Med-Surg Status is: Inpatient  Remains inpatient appropriate because: Pending PEG placement and endometrial biopsy 10/28, will need SNF placement thereafter.    Consultants:  Neurosurgery Neurology Cardiology Infectious disease PCCM Palliative care Gastroenterology OB/GYN  Procedures:  TTE VA shunt removal with placement left parietal approach ventriculostomy 10/11, neurosurgery, Dr.  Izora Ribas Extubated 10/11 EVD removal 10/19  Antimicrobials:  Ceftriaxone 10/13 -10/20 Linezolid 10/19 - 10/27 Vancomycin 10/10 - 10/12 Cefepime 10/10 - 10/12 Metronidazole 10/10 - 10/10    Subjective: Patient seen examined bedside, resting comfortably.  No family present at bedside.  Nonverbal.  Skin change per nursing staff.  Awaiting PEG placement and endometrial biopsy planned for Friday.  Then will plan for LTAC placement per social work.  Remains chronically ill with overall poor prognosis and palliative care continues to follow.  Unable to obtain any further ROS from patient today.  No acute concerns overnight per nursing staff.  Objective: Vitals:   06/06/21 0721 06/06/21 1139 06/06/21 1145 06/06/21 1330  BP: (!) 173/78 (!) 151/71 (!) 151/71 (!) 166/87  Pulse: (!) 107 86 86 97  Resp: (!) 21 (!) 28 (!) 26 (!) 25  Temp: 99.4 F (37.4 C) 99.5 F (37.5 C) 99.5 F (37.5 C) 99.7 F (37.6 C)  TempSrc: Oral Axillary  Axillary  SpO2: 93% 99% 99% 99%  Weight:      Height:       No intake or output data in the 24 hours ending 06/06/21 1447 Filed Weights   06/03/21 0400 06/04/21 2125 06/06/21 0500  Weight: 88.2 kg 88.9 kg 88.8 kg    Examination:  General exam: Appears calm and comfortable, chronically ill in appearance Respiratory system: Clear to auscultation. Respiratory effort normal.  On room air Cardiovascular system: S1 & S2 heard, RRR. No JVD, murmurs, rubs, gallops or clicks. No pedal edema.  Port-A-Cath noted to chest Gastrointestinal system: Abdomen is nondistended, soft and nontender. No organomegaly or masses felt. Normal bowel sounds heard.  NG tube noted with tube feeds. Central nervous system: Alert.  Left hemiparesis, minimal movement right extremities independent Extremities: Symmetric 5 x 5 power. Skin: No rashes, lesions or ulcers Psychiatry: Judgement and insight appear for. Mood & affect appropriate.     Data Reviewed: I have personally reviewed  following labs and imaging studies  CBC: Recent Labs  Lab 06/04/21 0430  WBC 14.1*  HGB 10.7*  HCT 32.8*  MCV 100.9*  PLT 725   Basic Metabolic Panel: Recent Labs  Lab 05/31/21 0338 06/01/21 0443 06/02/21 0509  NA 137 139 138  K 4.5 4.7 4.7  CL 102 102 101  CO2 $Re'27 29 30  'JOc$ GLUCOSE 139* 128* 126*  BUN $Re'19 20 22  'YBb$ CREATININE 0.79 0.75 0.75  CALCIUM 8.0* 8.2* 8.0*  MG 2.2  --   --    GFR: Estimated Creatinine Clearance: 74.5 mL/min (by C-G formula based on SCr of 0.75 mg/dL). Liver Function Tests: No results for input(s): AST, ALT, ALKPHOS, BILITOT, PROT, ALBUMIN in the last 168 hours. No results for input(s): LIPASE, AMYLASE in the last 168 hours. No results for input(s): AMMONIA in the last 168 hours. Coagulation Profile: No results for input(s): INR, PROTIME in the last 168 hours. Cardiac Enzymes: No results for input(s): CKTOTAL, CKMB, CKMBINDEX, TROPONINI in the last 168 hours. BNP (last 3 results) No results for input(s): PROBNP in the last 8760 hours. HbA1C: No results for input(s): HGBA1C in the last 72 hours. CBG: Recent Labs  Lab 06/05/21 1729  06/05/21 2128 06/06/21 0327 06/06/21 0805 06/06/21 1135  GLUCAP 126* 127* 144* 147* 122*   Lipid Profile: No results for input(s): CHOL, HDL, LDLCALC, TRIG, CHOLHDL, LDLDIRECT in the last 72 hours. Thyroid Function Tests: No results for input(s): TSH, T4TOTAL, FREET4, T3FREE, THYROIDAB in the last 72 hours. Anemia Panel: No results for input(s): VITAMINB12, FOLATE, FERRITIN, TIBC, IRON, RETICCTPCT in the last 72 hours. Sepsis Labs: No results for input(s): PROCALCITON, LATICACIDVEN in the last 168 hours.  No results found for this or any previous visit (from the past 240 hour(s)).       Radiology Studies: DG Chest Port 1 View  Result Date: 06/04/2021 CLINICAL DATA:  SOB (shortness of breath) R06.02 (ICD-10-CM) EXAM: PORTABLE CHEST 1 VIEW COMPARISON:  May 24, 2021. FINDINGS: Similar positioning of a  right subclavian approach Port-A-Cath with the tip projecting near the superior cavoatrial junction. Gastric tube courses below the diaphragm in outside the field of view. Low lung volumes. Mildly elevated left hemidiaphragm. Mild increase in streaky left basilar opacities. No visible pleural effusions or pneumothorax. Similar cardiomediastinal silhouette. Right axillary clips. IMPRESSION: Mild increase in streaky left basilar opacities, most likely atelectasis. Infection or aspiration is not excluded. Electronically Signed   By: Margaretha Sheffield M.D.   On: 06/04/2021 19:32        Scheduled Meds:  aspirin  81 mg Per Tube Daily   chlorhexidine gluconate (MEDLINE KIT)  15 mL Mouth Rinse BID   Chlorhexidine Gluconate Cloth  6 each Topical Daily   collagenase   Topical Daily   free water  30 mL Per Tube Q4H   Gerhardt's butt cream   Topical TID   linezolid  600 mg Per Tube Q12H   metoprolol tartrate  25 mg Oral BID   pantoprazole sodium  40 mg Per Tube Daily   sodium chloride flush  10-40 mL Intracatheter Q12H   thiamine  100 mg Oral Daily   Continuous Infusions:  sodium chloride     feeding supplement (OSMOLITE 1.2 CAL) 1,000 mL (06/05/21 2332)     LOS: 15 days    Time spent: 45 minutes spent on chart review, discussion with nursing staff, consultants, updating family and interview/physical exam; more than 50% of that time was spent in counseling and/or coordination of care.    Olamide Lahaie J [place] DO [facility] Available via Epic secure chat 7am-7pm After these hours, please refer to coverage provider listed on [email or web address] 06/06/2021, 2:47 PM

## 2021-06-06 NOTE — Progress Notes (Signed)
   06/06/21 1950  Assess: MEWS Score  Temp (!) 100.5 F (38.1 C)  BP (!) 162/79  Pulse Rate (!) 112  Resp (!) 30  SpO2 99 %  Assess: MEWS Score  MEWS Temp 1  MEWS Systolic 0  MEWS Pulse 2  MEWS RR 2  MEWS LOC 0  MEWS Score 5  MEWS Score Color Red  Assess: if the MEWS score is Yellow or Red  Were vital signs taken at a resting state? Yes  Focused Assessment No change from prior assessment  Does the patient meet 2 or more of the SIRS criteria? Yes  Does the patient have a confirmed or suspected source of infection? No  MEWS guidelines implemented *See Row Information* Yes  Treat  MEWS Interventions Other (Comment) (consulted charge RN)  Take Vital Signs  Increase Vital Sign Frequency  Red: Q 1hr X 4 then Q 4hr X 4, if remains red, continue Q 4hrs  Escalate  MEWS: Escalate Red: discuss with charge nurse/RN and provider, consider discussing with RRT  Notify: Charge Nurse/RN  Name of Charge Nurse/RN Notified Kayla, RN  Date Charge Nurse/RN Notified 06/06/21  Time Charge Nurse/RN Notified 1955  Notify: Provider  Provider Name/Title Dr. Tamala Julian  Date Provider Notified 06/06/21  Time Provider Notified 2000  Notification Type  (secure chat)  Notification Reason Other (Comment) (Red MEWS)  Provider response No new orders  Date of Provider Response 06/06/21  Time of Provider Response 2002  Document  Patient Outcome Stabilized after interventions (Tylenol given)  Progress note created (see row info) Yes  Assess: SIRS CRITERIA  SIRS Temperature  0  SIRS Pulse 1  SIRS Respirations  1  SIRS WBC 0  SIRS Score Sum  2  Red Mews for temp, RR and HR. 650mg  Tylenol given. Patient was previously yellow for RR and HR. Temp recheck after 1hr was 98.3. Patient HR and RR was still high and Dr. Tamala Julian was notified. Instructed to continue to monitor.

## 2021-06-06 NOTE — Progress Notes (Signed)
   06/06/21 1145  Assess: MEWS Score  Temp 99.5 F (37.5 C)  BP (!) 151/71  Pulse Rate 86  Resp (!) 26  SpO2 99 %  O2 Device Nasal Cannula  Patient Activity (if Appropriate) In bed  O2 Flow Rate (L/min) 22 L/min  Assess: if the MEWS score is Yellow or Red  Were vital signs taken at a resting state? Yes (Bilateral arm restriction, BP done on LLEx)  Focused Assessment No change from prior assessment  Does the patient meet 2 or more of the SIRS criteria? Yes  Does the patient have a confirmed or suspected source of infection? No  MEWS guidelines implemented *See Row Information* No, previously yellow, continue vital signs every 4 hours  Treat  MEWS Interventions Other (Comment) Engineer, maintenance (IT))  Pain Scale Faces  Faces Pain Scale 0  Breathing 0  Negative Vocalization 0  Facial Expression 0  Body Language 0  Consolability 0  PAINAD Score 0  Facial Expression 0  Body Movements 0  Muscle Tension 0  Compliance with ventilator (intubated pts.) N/A  Vocalization (extubated pts.) N/A  CPOT Total 0  Neuro symptoms relieved by Rest  Escalate  MEWS: Escalate Yellow: discuss with charge nurse/RN and consider discussing with provider and RRT  Notify: Charge Nurse/RN  Name of Charge Nurse/RN Notified Estill Bamberg RN  Date Charge Nurse/RN Notified 06/06/21  Time Charge Nurse/RN Notified 1203  Notify: Provider  Provider Name/Title Eric [place]  Date Provider Notified 06/06/21  Time Provider Notified 1216  Notification Type  (secure message)  Notification Reason Change in status (YELLOW MEWS (x2))  Document  Patient Outcome Other (Comment) (Stable)  Progress note created (see row info) Yes

## 2021-06-07 DIAGNOSIS — Z978 Presence of other specified devices: Secondary | ICD-10-CM

## 2021-06-07 DIAGNOSIS — J9601 Acute respiratory failure with hypoxia: Secondary | ICD-10-CM | POA: Diagnosis not present

## 2021-06-07 DIAGNOSIS — I639 Cerebral infarction, unspecified: Secondary | ICD-10-CM | POA: Diagnosis not present

## 2021-06-07 DIAGNOSIS — A419 Sepsis, unspecified organism: Secondary | ICD-10-CM | POA: Diagnosis not present

## 2021-06-07 DIAGNOSIS — G9340 Encephalopathy, unspecified: Secondary | ICD-10-CM | POA: Diagnosis not present

## 2021-06-07 LAB — GLUCOSE, CAPILLARY
Glucose-Capillary: 110 mg/dL — ABNORMAL HIGH (ref 70–99)
Glucose-Capillary: 111 mg/dL — ABNORMAL HIGH (ref 70–99)
Glucose-Capillary: 124 mg/dL — ABNORMAL HIGH (ref 70–99)
Glucose-Capillary: 126 mg/dL — ABNORMAL HIGH (ref 70–99)
Glucose-Capillary: 136 mg/dL — ABNORMAL HIGH (ref 70–99)

## 2021-06-07 LAB — BASIC METABOLIC PANEL
Anion gap: 6 (ref 5–15)
BUN: 28 mg/dL — ABNORMAL HIGH (ref 8–23)
CO2: 30 mmol/L (ref 22–32)
Calcium: 7.9 mg/dL — ABNORMAL LOW (ref 8.9–10.3)
Chloride: 101 mmol/L (ref 98–111)
Creatinine, Ser: 0.8 mg/dL (ref 0.44–1.00)
GFR, Estimated: >60 mL/min (ref >60)
Glucose, Bld: 121 mg/dL — ABNORMAL HIGH (ref 70–99)
Potassium: 4.6 mmol/L (ref 3.5–5.1)
Sodium: 137 mmol/L (ref 135–145)

## 2021-06-07 LAB — CBC
HCT: 30.8 % — ABNORMAL LOW (ref 36.0–46.0)
Hemoglobin: 10.3 g/dL — ABNORMAL LOW (ref 12.0–15.0)
MCH: 33 pg (ref 26.0–34.0)
MCHC: 33.4 g/dL (ref 30.0–36.0)
MCV: 98.7 fL (ref 80.0–100.0)
Platelets: 287 10*3/uL (ref 150–400)
RBC: 3.12 MIL/uL — ABNORMAL LOW (ref 3.87–5.11)
RDW: 13.5 % (ref 11.5–15.5)
WBC: 7.1 10*3/uL (ref 4.0–10.5)
nRBC: 0 % (ref 0.0–0.2)

## 2021-06-07 NOTE — Progress Notes (Deleted)
Physical Therapy Treatment Patient Details Name: Desiree Mitchell MRN: [id number] DOB: 21-Jul-1956 Today's Date: 06/08/2021   History of Present Illness      PT Comments    PT/OT co treat 2/2 to pt requiring +2 assistance for all mobility/transfers. Pt required +2 assist to exit bed toward L via log roll technique. She performed several there ex at EOB prior to standing +2 max assist 2 x. Poor standing posture and tolerance. Pt fatigues quickly and will need extensive PT going forward to assist her with returning to PLOF. Highly recommend DC to SNF to address deficits while maximizing independence with ADLs.    Recommendations for follow up therapy are one component of a multi-disciplinary discharge planning process, led by the attending physician.  Recommendations may be updated based on patient status, additional functional criteria and insurance authorization.  Follow Up Recommendations  SNF     Assistance Recommended at Discharge    Equipment Recommendations          Precautions / Restrictions       Mobility  Bed Mobility                    Transfers                        Ambulation/Gait          Balance                         Cognition                              Pertinent Vitals/Pain             PT Goals (current goals can now be found in the care plan section)      Frequency           PT Plan      Co-evaluation              AM-PAC PT "6 Clicks" Mobility   Outcome Measure                   End of Session               Time:  -     Charges:                        Julaine Fusi PTA 06/08/21, 11:42 AM

## 2021-06-07 NOTE — Progress Notes (Signed)
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  Patient was last seen normal on 05/19/2021 at 4 PM.  On EMS arrival, patient was noted be hypoxic with SPO2 in the 70s with a normal blood sugar.  EMS with difficulty obtaining blood pressure and patient received 300 mL of LR, 40 mg of lidocaine, 25 mg of Phenergan and an IO was placed in the right humeral head.  Family later reported to EDP that they found her sitting on the toilet slumped with her neck up against a basket.  Of note patient arrived infested with bedbugs and roaches.  In the ED, temperature 91.1 F, HR 95, RR 21, BP 169/123, SPO2 95% on right nonrebreather 15 L.  Sodium 137, potassium 4.8, chloride 97, BUN/creatinine 84/2.1, CO2 17, anion gap 23, AST 155, ALT 59, CK 6755.  WBC 30.3, hemoglobin 15.9, high sensory troponin 22, lactic acid 2.4.  COVID-19 PCR negative.  Influenza A/B PCR negative.  VBG with pH 7.21, PO2 53, PCO2 32.  Chest x-ray with mild left basilar atelectasis without evidence of acute infiltrate.  CT head without contrast interval development of mild hydrocephalus and periventricular edema associated with malpositioned left VA shunt that terminates in the anterior mediastinum and no longer within the left brachiocephalic vein, shunt no to be coiled within the neck, GGO right lower lobe and patchy airspace opacity within the left lower lobe.  CT C-spine with no acute fracture or traumatic injury noted.  Neurosurgery was consulted and patient was taken emergently to the OR by Dr. Izora Ribas for removal of her VA shunt and placement of  external ventricular drain.  Patient was initially admitted to the PCCM service and transferred to hospitalist service on 05/30/2021.   Assessment & Plan:   Principal Problem:   Sepsis ([place]) Active Problems:   Endotracheally intubated   On mechanically assisted ventilation (HCC)   Shunt malfunction   Rhabdomyolysis   AKI (acute kidney injury) ([place])   Increased anion gap metabolic acidosis   Acute encephalopathy   Transaminitis   Pressure injury of skin   Acute embolic stroke [facility])   Palliative care by specialist   Full code status   Severe sepsis, POA Proteus/staph epidermis septicemia Patient was seen by infectious disease, etiology suspected from her chronic wounds.  No evidence of VP shunt infection with negative CSF cultures.  TTE unrevealing.  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 Wandalee Ferdinand low risk for endocarditis.  Completed 10-day course of ceftriaxone and 14-day course of linezolid.  Infectious disease now signed off.  VA shunt malfunction Patient presented to the ED with confusion, CT head with interval development of mild this and periventricular edema associated with malposition left VA shunt.  Patient was taken emergently to the OR by neurosurgery, Dr. Izora Ribas on 10/11 with complete removal of ventricular atrial shunt and placement of left parietal approach ventriculostomy.  External ventriculostomy drain removed by neurosurgery on 05/30/2021.  CSF cultures negative.  Further intervention planned by neurosurgery team at this time. --Continue monitor mental status  Acute hypoxic respiratory failure, postoperative: Resolved Following shunt removal and  EVD placement, patient remained on mechanical ventilation.  Patient now extubated and weaned to room air.  Multifocal strokes Pain with multifocal infarcts noted, concerning for embolic in nature.  Neurology was consulted.  TTE unrevealing.  Unable to undergo TEE  per cardiology due to anatomic abnormalities of epiglottis. --Aspirin 81 mg daily --Palliative care following, overall very poor prognosis.  Pending PEG tube placement with plan SNF thereafter.  Essential hypertension --Toprol tartrate 25 mg twice daily  Low normal B12 --Vitamin B12 level, 214.  Homocysteine level 17.0, within normal limits --s/p IM vitamin B12 supplementation x 3 (10/17 - 10/19) (goal >500) --Repeat B12 levels outpatient by PCP  Rhabdomyolysis Etiology likely secondary to prolonged downtime.  Received IV fluid hydration.  Improved.  Dysphagia --Continue tube feeds; hold at midnight tonight for PEG tomorrow --GI plans PEG placement 10/28  GERD: Protonix 40 mg daily  Abnormal uterine bleeding Vaginal ultrasound with endometrial polyp, vascular stalk. --GYN following --Plan endometrial biopsy on 10/28 --Monitor CBC daily --Transfuse for hemoglobin less than 7.0  Posterior neck, buttock, coccyx pressure injuries, POA Etiology likely secondary to being found down for unknown amount of time at home. Pressure Injury 05/22/21 Buttocks Right Deep Tissue Pressure Injury - Purple or maroon localized area of discolored intact skin or blood-filled blister due to damage of underlying soft tissue from pressure and/or shear. (Active)  05/22/21 0300  Location: Buttocks  Location Orientation: Right  Staging: Deep Tissue Pressure Injury - Purple or maroon localized area of discolored intact skin or blood-filled blister due to damage of underlying soft tissue from pressure and/or shear.  Wound Description (Comments):   Present on Admission: Yes     Pressure Injury 05/22/21 Coccyx Stage 2 -  Partial thickness loss of dermis presenting as a shallow open injury with a red, pink wound bed without slough. (Active)  05/22/21 0300  Location: Coccyx  Location Orientation:   Staging: Stage 2 -  Partial thickness loss of dermis presenting as a shallow open injury with a red, pink wound  bed without slough.  Wound Description (Comments):   Present on Admission: Yes     Pressure Injury 05/22/21 Cervical Deep Tissue Pressure Injury - Purple or maroon localized area of discolored intact skin or blood-filled blister due to damage of underlying soft tissue from pressure and/or shear. (Active)  05/22/21 0300  Location: Cervical  Location Orientation:   Staging: Deep Tissue Pressure Injury - Purple or maroon localized area of discolored intact skin or blood-filled blister due to damage of underlying soft tissue from pressure and/or shear.  Wound Description (Comments):   Present on Admission: Yes     Pressure Injury 06/05/21 Buttocks Left Unstageable - Full thickness tissue loss in which the base of the injury is covered by slough (yellow, tan, gray, green or brown) and/or eschar (tan, brown or black) in the wound bed. Long curved wound on whole left (Active)  06/05/21 0622  Location: Buttocks  Location Orientation: Left  Staging: Unstageable - Full thickness tissue loss in which the base of the injury is covered by slough (yellow, tan, gray, green or brown) and/or eschar (tan, brown or black) in the wound bed.  Wound Description (Comments): Long curved wound on whole left buttocks. black yellow red pink and bloody  Present on Admission:   --continue local wound care, offloading, Foley catheter placed to ensure proper wound healing and decrease wetness    DVT prophylaxis: Place and maintain sequential compression device Start: 06/04/21 1538 SCDs Start: 05/22/21 0156  Code Status: Partial Code Family Communication: No family present at bedside this morning.  Disposition Plan:  Level of care: Med-Surg Status is: Inpatient  Remains inpatient appropriate because: Pending PEG placement and endometrial biopsy 10/28, will need SNF/LTACH placement thereafter.    Consultants:  Neurosurgery Neurology Cardiology Infectious disease PCCM Palliative  care Gastroenterology OB/GYN  Procedures:  TTE VA shunt removal with placement left parietal approach ventriculostomy 10/11, neurosurgery, Dr. Izora Ribas Extubated 10/11 EVD removal 10/19 Foley catheter placement 10/26  Antimicrobials:  Ceftriaxone 10/13 -10/20 Linezolid 10/19 - 10/27 Vancomycin 10/10 - 10/12 Cefepime 10/10 - 10/12 Metronidazole 10/10 - 10/10    Subjective: Patient seen examined bedside, resting comfortably.  No family present at bedside.  Minimal verbal interaction but denies pain.  Awaiting PEG placement and endometrial biopsy planned tomorrow.  Remains chronically ill with overall poor prognosis and palliative care continues to follow.  Patient had Foley placed overnight due to incontinence and protection of buttock wounds, otherwise no acute concerns overnight per nursing staff.  Objective: Vitals:   06/07/21 0500 06/07/21 0914 06/07/21 1100 06/07/21 1126  BP:  (!) 159/78  (!) 160/78  Pulse:  (!) 108  89  Resp:  20  20  Temp:  98.7 F (37.1 C)  97.9 F (36.6 C)  TempSrc:  Oral    SpO2:  98% 98% 100%  Weight: 88.4 kg     Height:        Intake/Output Summary (Last 24 hours) at 06/07/2021 1159 Last data filed at 06/07/2021 1129 Gross per 24 hour  Intake 1940 ml  Output 950 ml  Net 990 ml   Filed Weights   06/04/21 2125 06/06/21 0500 06/07/21 0500  Weight: 88.9 kg 88.8 kg 88.4 kg    Examination:  General exam: Appears calm and comfortable, chronically ill in appearance Respiratory system: Clear to auscultation. Respiratory effort normal.  On room air Cardiovascular system: S1 & S2 heard, RRR. No JVD, murmurs, rubs, gallops or clicks. No pedal edema.  Port-A-Cath noted to chest Gastrointestinal system: Abdomen is nondistended, soft and nontender. No organomegaly or masses felt. Normal bowel sounds heard.  NG tube noted with tube feeds. Central nervous system: Alert.  Left hemiparesis, minimal movement right extremities independent Extremities:  Symmetric 5 x 5 power. Skin: No rashes, lesions or ulcers Psychiatry: Judgement and insight appear for. Mood & affect appropriate.     Data Reviewed: I have personally reviewed following labs and imaging studies  CBC: Recent Labs  Lab 06/04/21 0430 06/07/21 0615  WBC 14.1* 7.1  HGB 10.7* 10.3*  HCT 32.8* 30.8*  MCV 100.9* 98.7  PLT 325 048   Basic Metabolic Panel: Recent Labs  Lab 06/01/21 0443 06/02/21 0509 06/07/21 0615  NA 139 138 137  K 4.7 4.7 4.6  CL 102 101 101  CO2 _0 GLUCOSE 128* 126* 121*  BUN 20 22 28*  CREATININE 0.75 0.75 0.80  CALCIUM 8.2* 8.0* 7.9*   GFR: Estimated Creatinine Clearance: 74.3 mL/min (by C-G formula based on SCr of 0.8 mg/dL). Liver Function Tests: No results for input(s): AST, ALT, ALKPHOS, BILITOT, PROT, ALBUMIN in the last 168 hours. No results for input(s): LIPASE, AMYLASE in the last 168 hours. No results for input(s): AMMONIA in the last 168 hours. Coagulation Profile: No results for input(s): INR, PROTIME in the last 168 hours. Cardiac Enzymes: No results for input(s): CKTOTAL, CKMB, CKMBINDEX, TROPONINI in the last 168 hours. BNP (last 3 results) No results for input(s): PROBNP in the  last 8760 hours. HbA1C: No results for input(s): HGBA1C in the last 72 hours. CBG: Recent Labs  Lab 06/06/21 1627 06/06/21 2015 06/07/21 0004 06/07/21 0730 06/07/21 1123  GLUCAP 109* 144* 136* 111* 126*   Lipid Profile: No results for input(s): CHOL, HDL, LDLCALC, TRIG, CHOLHDL, LDLDIRECT in the last 72 hours. Thyroid Function Tests: No results for input(s): TSH, T4TOTAL, FREET4, T3FREE, THYROIDAB in the last 72 hours. Anemia Panel: No results for input(s): VITAMINB12, FOLATE, FERRITIN, TIBC, IRON, RETICCTPCT in the last 72 hours. Sepsis Labs: No results for input(s): PROCALCITON, LATICACIDVEN in the last 168 hours.  No results found for this or any previous visit (from the past 240 hour(s)).       Radiology  Studies: No results found.      Scheduled Meds:  aspirin  81 mg Per Tube Daily   chlorhexidine gluconate (MEDLINE KIT)  15 mL Mouth Rinse BID   Chlorhexidine Gluconate Cloth  6 each Topical Daily   collagenase   Topical Daily   free water  30 mL Per Tube Q4H   Gerhardt's butt cream   Topical TID   metoprolol tartrate  25 mg Oral BID   pantoprazole sodium  40 mg Per Tube Daily   sodium chloride flush  10-40 mL Intracatheter Q12H   thiamine  100 mg Oral Daily   Continuous Infusions:  sodium chloride     feeding supplement (OSMOLITE 1.2 CAL) 1,000 mL (06/07/21 1039)     LOS: 16 days    Time spent: 43 minutes spent on chart review, discussion with nursing staff, consultants, updating family and interview/physical exam; more than 50% of that time was spent in counseling and/or coordination of care.    Daelynn Blower J [place] DO [facility] Available via Epic secure chat 7am-7pm After these hours, please refer to coverage provider listed on [email or web address] 06/07/2021, 11:59 AM

## 2021-06-07 NOTE — Progress Notes (Signed)
Patient was noted to have a wet cough throughout the night. Unable to cough out the secretions, patient would just swallow them and begin coughing again. Dr. Tamala Julian was notified and respiratory was called to NG suction. Unstageable ulcers on bottom were cleansed and redressed. Patients rectal tube was found to be leaking and vaginal bleeding was noted. Dr. Tamala Julian was again notified and a foley order was placed. Patients MEWS turned back green after suctioning and foley was placed. Patient is currently resting comfortably. VSS. Has remained free from falls. No complaints at this time.

## 2021-06-07 NOTE — Progress Notes (Signed)
Patient with weak, congested cough effort. Patient NT suctioned for moderate amount thick tan sputum. SpO2 99% on 2L. Will repeat as needed.

## 2021-06-07 NOTE — Progress Notes (Signed)
Occupational Therapy Treatment Patient Details Name: Desiree Mitchell MRN: [id number] DOB: [birth year] Today's Date: 06/07/2021   History of present illness Pt is a 65 y.o. female presenting to hospital 05/21/21 with AMS (found unresponsive by family).  Significant pressure sore present back of neck, buttocks, and coccyx.  Pt noted to be infested with bed bugs and roaches.  Pt admitted with acute respiratory failure, rhabdomyolysis, metabolic acidosis, acute kidney injury, sepsis, VA shunt malfunction, transaminitis, and posterior neck/buttocks/coccyx pressure injuries.  S/p removal ventriculoatrial shunt and placement of L parietal approach ventriculostomy 05/22/21; extubated 10/11; MRI brain 10/14 showing few scattered small acute infarcts involving different vascular territories (watershed type infarcts); TEE 10/18; and EVD removed 10/19.    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   OT comments  Pt seen for OT/PT co-treatment on this date. Upon arrival to room, pt awake and sitting upright in bed. Pt with improved cogntion this date, responding to questions with full sentences following increased processing time. Pt reporting no pain and agreeable to OT/PT session. Pt currently presents with decreased awareness of deficits, decreased balance, decreased strength, and decreased activity tolerance. Due to these functional impairments, pt requires MAX A+2 for bed mobility and MAX A+2 for sit<>stand attempts (see PT note for more information on functional mobility). Pt demonstrated improved sitting balance this date; pt was able to maintain sitting balance at EOB with MIN A for trunk support while receiving MAX A via HHA for seated UB dressing and grooming. Pt is making good progress toward goals and continues to benefit from skilled OT services to maximize return to PLOF and minimize risk of future falls, injury, caregiver burden, and  readmission. Will continue to follow POC. Discharge recommendation remains appropriate.     Recommendations for follow up therapy are one component of a multi-disciplinary discharge planning process, led by the attending physician.  Recommendations may be updated based on patient status, additional functional criteria and insurance authorization.    Follow Up Recommendations  Skilled nursing-short term rehab (<3 hours/day)    Assistance Recommended at Discharge Frequent or constant Supervision/Assistance  Equipment Recommendations  Other (comment) (defer to next venue of care)       Precautions / Restrictions Precautions Precautions: Fall Precaution Comments: Aspiration; gastric tube; rectal tube; R chest port; posterior neck, buttocks, and coccyx pressure injuries Restrictions Weight Bearing Restrictions: No       Mobility Bed Mobility Overal bed mobility: Needs Assistance Bed Mobility: Supine to Sit;Sit to Supine     Supine to sit: Max assist;+2 for physical assistance Sit to supine: Max assist;+2 for physical assistance   General bed mobility comments: Requires assist for trunk and b/l LE via log roll technique    Transfers Overall transfer level: Needs assistance Equipment used: Rolling walker (2 wheels) Transfers: Lateral/Scoot Transfers;Sit to/from Stand Sit to Stand: Max assist;+2 physical assistance          Lateral/Scoot Transfers: Max assist;+2 physical assistance;From elevated surface General transfer comment: Pt able to clear hips from bed with MAX A+2, but unable to achieve upright stance     Balance Overall balance assessment: Needs assistance Sitting-balance support: Feet supported;Bilateral upper extremity supported Sitting balance-Leahy Scale: Poor Sitting balance - Comments: Intermittently requires MIN assist d/t posterior/forward lean when fatigued. CGA at times with pt's R UE holding onto bottom bedrail; vc's for technique Postural control:  Posterior lean Standing balance support: Bilateral upper extremity supported;During functional activity;Reliant on  assistive device for balance Standing balance-Leahy Scale: Zero Standing balance comment: Unable to achieve upright stance following MAX A+2                           ADL either performed or assessed with clinical judgement   ADL Overall ADL's : Needs assistance/impaired     Grooming: Wash/dry face;Maximal assistance;Sitting Grooming Details (indicate cue type and reason): With HHA, pt requires MAX A to wash face with washcloth         Upper Body Dressing : Maximal assistance;Sitting Upper Body Dressing Details (indicate cue type and reason): To don/doff hospital gown; pt able to use RUE to doff L sleeve, however requires HHA for remainder of task Lower Body Dressing: Maximal assistance;Bed level Lower Body Dressing Details (indicate cue type and reason): to don/doff socks             Functional mobility during ADLs: Maximal assistance;+2 for physical assistance        Cognition Arousal/Alertness: Awake/alert Behavior During Therapy: Flat affect Overall Cognitive Status: No family/caregiver present to determine baseline cognitive functioning                                 General Comments: Pt with improved cogntion this date, responding to questions with full sentences following increased processing time.                     Pertinent Vitals/ Pain        Denies pain/no pain         Frequency  Min 2X/week        Progress Toward Goals  OT Goals(current goals can now be found in the care plan section)  Progress towards OT goals: Progressing toward goals  Acute Rehab OT Goals OT Goal Formulation: Patient unable to participate in goal setting Time For Goal Achievement: 06/15/21 Potential to Achieve Goals: [place] Discharge plan remains appropriate;Frequency remains appropriate    Co-evaluation    PT/OT/SLP  Co-Evaluation/Treatment: Yes Reason for Co-Treatment: Complexity of the patient's impairments (multi-system involvement);For patient/therapist safety;To address functional/ADL transfers PT goals addressed during session: Mobility/safety with mobility OT goals addressed during session: ADL's and self-care      AM-PAC OT "6 Clicks" Daily Activity     Outcome Measure   Help from another person eating meals?: A Lot Help from another person taking care of personal grooming?: A Lot Help from another person toileting, which includes using toliet, bedpan, or urinal?: Total (FMS/foley) Help from another person bathing (including washing, rinsing, drying)?: A Lot Help from another person to put on and taking off regular upper body clothing?: A Lot Help from another person to put on and taking off regular lower body clothing?: Total 6 Click Score: 10    End of Session Equipment Utilized During Treatment: Rolling walker (2 wheels)  OT Visit Diagnosis: Muscle weakness (generalized) (M62.81);Adult, failure to thrive (R62.7);Other symptoms and signs involving cognitive function   Activity Tolerance Patient tolerated treatment well   Patient Left in bed;with call bell/phone within reach;with bed alarm set   Nurse Communication Mobility status        Time: 6734-1937 OT Time Calculation (min): 31 min  Charges: OT General Charges $OT Visit: 1 Visit OT Treatments $Self Care/Home Management : 8-22 mins  Fredirick Maudlin, OTR/L [place]

## 2021-06-08 ENCOUNTER — Encounter
Admission: EM | Disposition: A | Discharge status: Skilled Nursing Facility | Payer: Medicare (Managed Care) | Source: Home / Self Care | Attending: Internal Medicine

## 2021-06-08 ENCOUNTER — Encounter: Payer: Self-pay | Admitting: Anesthesiology

## 2021-06-08 ENCOUNTER — Encounter
Admission: EM | Disposition: A | Discharge status: Skilled Nursing Facility | Payer: Self-pay | Source: Home / Self Care | Attending: Internal Medicine

## 2021-06-08 DIAGNOSIS — Z9911 Dependence on respirator [ventilator] status: Secondary | ICD-10-CM

## 2021-06-08 DIAGNOSIS — Z4659 Encounter for fitting and adjustment of other gastrointestinal appliance and device: Secondary | ICD-10-CM

## 2021-06-08 DIAGNOSIS — N939 Abnormal uterine and vaginal bleeding, unspecified: Secondary | ICD-10-CM

## 2021-06-08 DIAGNOSIS — Z66 Do not resuscitate: Secondary | ICD-10-CM

## 2021-06-08 DIAGNOSIS — G9340 Encephalopathy, unspecified: Secondary | ICD-10-CM | POA: Diagnosis not present

## 2021-06-08 DIAGNOSIS — J9601 Acute respiratory failure with hypoxia: Secondary | ICD-10-CM | POA: Diagnosis not present

## 2021-06-08 DIAGNOSIS — Z515 Encounter for palliative care: Secondary | ICD-10-CM | POA: Diagnosis not present

## 2021-06-08 DIAGNOSIS — R0602 Shortness of breath: Secondary | ICD-10-CM

## 2021-06-08 DIAGNOSIS — A419 Sepsis, unspecified organism: Secondary | ICD-10-CM | POA: Diagnosis not present

## 2021-06-08 DIAGNOSIS — I639 Cerebral infarction, unspecified: Secondary | ICD-10-CM | POA: Diagnosis not present

## 2021-06-08 LAB — GLUCOSE, CAPILLARY
Glucose-Capillary: 97 mg/dL (ref 70–99)
Glucose-Capillary: 97 mg/dL (ref 70–99)
Glucose-Capillary: 83 mg/dL (ref 70–99)

## 2021-06-08 SURGERY — INSERTION, PEG TUBE
Anesthesia: General

## 2021-06-08 MED ORDER — ONDANSETRON 4 MG PO TBDP
4.0000 mg | ORAL_TABLET | Freq: Four times a day (QID) | ORAL | Status: DC | PRN
  Filled 2021-06-08: qty 1

## 2021-06-08 MED ORDER — GLYCOPYRROLATE 0.2 MG/ML IJ SOLN
0.2000 mg | INTRAMUSCULAR | Status: DC | PRN
  Administered 2021-06-08 (×2): 0.2 mg via INTRAVENOUS
  Filled 2021-06-08 (×2): qty 1

## 2021-06-08 MED ORDER — HALOPERIDOL LACTATE 2 MG/ML PO CONC
0.5000 mg | ORAL | Status: DC | PRN
  Filled 2021-06-08: qty 0.3

## 2021-06-08 MED ORDER — POLYVINYL ALCOHOL 1.4 % OP SOLN
1.0000 [drp] | Freq: Four times a day (QID) | OPHTHALMIC | Status: DC | PRN
  Filled 2021-06-08: qty 15

## 2021-06-08 MED ORDER — ACETAMINOPHEN 650 MG RE SUPP: 650.0000 mg | Freq: Four times a day (QID) | RECTAL | Status: DC | PRN

## 2021-06-08 MED ORDER — HALOPERIDOL LACTATE 5 MG/ML IJ SOLN: 0.5000 mg | INTRAMUSCULAR | Status: DC | PRN

## 2021-06-08 MED ORDER — GLYCOPYRROLATE 1 MG PO TABS
1.0000 mg | ORAL_TABLET | ORAL | Status: DC | PRN
  Filled 2021-06-08: qty 1

## 2021-06-08 MED ORDER — GLYCOPYRROLATE 0.2 MG/ML IJ SOLN: 0.2000 mg | INTRAMUSCULAR | Status: DC | PRN

## 2021-06-08 MED ORDER — ONDANSETRON HCL 4 MG/2ML IJ SOLN
4.0000 mg | Freq: Four times a day (QID) | INTRAMUSCULAR | Status: DC | PRN
  Administered 2021-06-12: 4 mg via INTRAVENOUS
  Filled 2021-06-08 (×2): qty 2

## 2021-06-08 MED ORDER — ACETAMINOPHEN 325 MG PO TABS
650.0000 mg | ORAL_TABLET | Freq: Four times a day (QID) | ORAL | Status: DC | PRN
  Administered 2021-06-13 – 2021-07-10 (×16): 650 mg via ORAL
  Filled 2021-06-08 (×16): qty 2

## 2021-06-08 MED ORDER — HALOPERIDOL 0.5 MG PO TABS
0.5000 mg | ORAL_TABLET | ORAL | Status: DC | PRN
  Filled 2021-06-08: qty 1

## 2021-06-08 NOTE — Progress Notes (Addendum)
GI Inpatient Follow-up Note  Subjective:  Patient seen and evaluated this morning. No family at beside. She has minimal verbal interaction. She denies any pain. Palliative care continues to follow. No acute events overnight per nursing.   Scheduled Inpatient Medications:   aspirin  81 mg Per Tube Daily   chlorhexidine gluconate (MEDLINE KIT)  15 mL Mouth Rinse BID   Chlorhexidine Gluconate Cloth  6 each Topical Daily   collagenase   Topical Daily   free water  30 mL Per Tube Q4H   Gerhardt's butt cream   Topical TID   metoprolol tartrate  25 mg Oral BID   pantoprazole sodium  40 mg Per Tube Daily   sodium chloride flush  10-40 mL Intracatheter Q12H   thiamine  100 mg Oral Daily    Continuous Inpatient Infusions:    sodium chloride     feeding supplement (OSMOLITE 1.2 CAL) 1,000 mL (06/07/21 1039)    PRN Inpatient Medications:  acetaminophen, albuterol, docusate sodium, guaiFENesin-dextromethorphan, labetalol, polyethylene glycol, sodium chloride flush  Review of Systems:  Unable to obtain 2/2 patient's mental status and condition    Physical Examination: BP (!) 157/85 (BP Location: Left Leg)   Pulse 100   Temp 97.8 F (36.6 C) (Oral)   Resp 17   Ht 5' 2"  (1.575 m)   Wt 87.9 kg   SpO2 100%   BMI 35.44 kg/m  Chronically ill-appearing female in hospital bed. No accessory muscle use. No family at bedside. Nurse, Lenna Sciara, at bedside.  Gen: NAD, alert and oriented x 4 HEENT: PEERLA, EOMI, NGT in place. No scleral icterus.  Neck: supple, no JVD or thyromegaly Chest: Course breath sounds bilaterally. No increased respiratory effort.  CV: RRR, no m/g/c/r Abd: soft, NT, ND, +BS in all four quadrants; no HSM, guarding, ridigity, or rebound tenderness Ext: no edema, well perfused with 2+ pulses, Skin: no rash or lesions noted Lymph: no LAD  Data: Lab Results  Component Value Date   WBC 7.1 06/07/2021   HGB 10.3 (L) 06/07/2021   HCT 30.8 (L) 06/07/2021   MCV 98.7  06/07/2021   PLT 287 06/07/2021   Recent Labs  Lab 06/04/21 0430 06/07/21 0615  HGB 10.7* 10.3*   Lab Results  Component Value Date   NA 137 06/07/2021   K 4.6 06/07/2021   CL 101 06/07/2021   CO2 30 06/07/2021   BUN 28 (H) 06/07/2021   CREATININE 0.80 06/07/2021   Lab Results  Component Value Date   ALT 41 05/25/2021   AST 47 (H) 05/25/2021   ALKPHOS 58 05/25/2021   BILITOT 1.0 05/25/2021   No results for input(s): APTT, INR, PTT in the last 168 hours.  Assessment:   # Oropharyngeal Dysphagia - Patient assessed by Speech Pathology and recommended continued assessment - Currently with NGT and Tube feeding in place and tolerating. Tube feeds have been held since midnight.  - Leukocytosis has resolved. She is tolerating tube feeds well.  - Noted TEE scope was unable to pass - Unknown abdominal surgical history- no scar noted on exam - BMI 35.6   # Sepsis secondary to skin infection- resolving # Multi focal Stroke 2/2 septic emboli # Acute encephalopathy # rhabdomyolysis- resolving # acute hypoxic respiratory failure- now s/p extubation # multiple pressure related wounds # s/p ventriculostomy and EVD placement and shunt removal # Obesity # h/o CVA with right sided deficits 2007 # h/o Breast CA s/p mastectomy and chemo/radiation # palliative performance score is 20-30% in  review of palliative care consult during hospitalization  Plan:  - Proceed with PEG tube placement as scheduled this afternoon with Dr. Virgina Jock - See progress note from 10/23 regarding discussion on risks, benefits, and potential complication. Informed consent already obtained.  - See procedure note for further recommendations - NPO status  - Attempted to call daughter x 2 with no answer and mailbox full so unable to leave voice message  Please call with questions or concerns.    Octavia Bruckner, PA-C [facility] [phone] (Cell)

## 2021-06-08 NOTE — Progress Notes (Signed)
Procedure for PEG cancelled Will not take for EMB as this was to be done concurrently under anesthesia. Endoscopy suite now not available nor appropriate for just this procedure. Will postpone until more accessible exam room or other sedation procedure.  Do not feel it is justifiable for this procedure alone to have anesthesia. Discussed this w daughter, [name]. Cont to monitor bleeding frequency and amount.  Consider progesterone therapy for prolonged bleeding  Barnett Applebaum, MD, [place], [facility] 06/08/2021  1:30 PM

## 2021-06-08 NOTE — Progress Notes (Signed)
PT Cancellation Note  Patient Details Name: Desiree Mitchell MRN: [id number] DOB: 24-Nov-1955   Cancelled Treatment:    Reason Eval/Treat Not Completed: Other (comment). Pt placed on comfort care, PT orders discontinued at this time, PT to sign off.   Lieutenant Diego PT, DPT 2:05 PM,06/08/21

## 2021-06-08 NOTE — Progress Notes (Signed)
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  Patient was last seen normal on 05/19/2021 at 4 PM.  On EMS arrival, patient was noted be hypoxic with SPO2 in the 70s with a normal blood sugar.  EMS with difficulty obtaining blood pressure and patient received 300 mL of LR, 40 mg of lidocaine, 25 mg of Phenergan and an IO was placed in the right humeral head.  Family later reported to EDP that they found her sitting on the toilet slumped with her neck up against a basket.  Of note patient arrived infested with bedbugs and roaches.  In the ED, temperature 91.1 F, HR 95, RR 21, BP 169/123, SPO2 95% on right nonrebreather 15 L.  Sodium 137, potassium 4.8, chloride 97, BUN/creatinine 84/2.1, CO2 17, anion gap 23, AST 155, ALT 59, CK 6755.  WBC 30.3, hemoglobin 15.9, high sensory troponin 22, lactic acid 2.4.  COVID-19 PCR negative.  Influenza A/B PCR negative.  VBG with pH 7.21, PO2 53, PCO2 32.  Chest x-ray with mild left basilar atelectasis without evidence of acute infiltrate.  CT head without contrast interval development of mild hydrocephalus and periventricular edema associated with malpositioned left VA shunt that terminates in the anterior mediastinum and no longer within the left brachiocephalic vein, shunt no to be coiled within the neck, GGO right lower lobe and patchy airspace opacity within the left lower lobe.  CT C-spine with no acute fracture or traumatic injury noted.  Neurosurgery was consulted and patient was taken emergently to the OR by Dr. Izora Ribas for removal of her VA shunt and placement of  external ventricular drain.  Patient was initially admitted to the PCCM service and transferred to hospitalist service on 05/30/2021.   Assessment & Plan:   Principal Problem:   Sepsis ([place]) Active Problems:   Endotracheally intubated   On mechanically assisted ventilation (HCC)   Shunt malfunction   Rhabdomyolysis   AKI (acute kidney injury) ([place])   Increased anion gap metabolic acidosis   Acute encephalopathy   Transaminitis   Pressure injury of skin   Acute embolic stroke [facility])   Palliative care by specialist   Full code status   Severe sepsis, POA Proteus/staph epidermis septicemia Patient was seen by infectious disease, etiology suspected from her chronic wounds.  No evidence of VP shunt infection with negative CSF cultures.  TTE unrevealing.  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 Wandalee Ferdinand low risk for endocarditis.  Completed 10-day course of ceftriaxone and 14-day course of linezolid.  Infectious disease now signed off.  VA shunt malfunction Patient presented to the ED with confusion, CT head with interval development of mild this and periventricular edema associated with malposition left VA shunt.  Patient was taken emergently to the OR by neurosurgery, Dr. Izora Ribas on 10/11 with complete removal of ventricular atrial shunt and placement of left parietal approach ventriculostomy.  External ventriculostomy drain removed by neurosurgery on 05/30/2021.  CSF cultures negative.  Further intervention planned by neurosurgery team at this time. --Continue monitor mental status  Acute hypoxic respiratory failure, postoperative: Resolved Following shunt removal and  EVD placement, patient remained on mechanical ventilation.  Patient now extubated and weaned to room air.  Multifocal strokes Pain with multifocal infarcts noted, concerning for embolic in nature.  Neurology was consulted.  TTE unrevealing.  Unable to undergo TEE  per cardiology due to anatomic abnormalities of epiglottis. --Aspirin 81 mg daily --Palliative care following, overall very poor prognosis.   --patient and daughter now declining PEG tube placement per palliative care today  Essential hypertension --Toprol tartrate 25 mg twice daily  Low normal B12 --Vitamin B12 level, 214.  Homocysteine level 17.0, within normal limits --s/p IM vitamin B12 supplementation x 3 (10/17 - 10/19) (goal >500) --Repeat B12 levels outpatient by PCP  Rhabdomyolysis Etiology likely secondary to prolonged downtime.  Received IV fluid hydration.  Improved.  Dysphagia --patient and daughter now declining PEG tube placement per palliative care today  GERD: Protonix 40 mg daily  Abnormal uterine bleeding Vaginal ultrasound with endometrial polyp, vascular stalk. --GYN following --Plan endometrial biopsy on 10/28 --Monitor CBC daily --Transfuse for hemoglobin less than 7.0  Posterior neck, buttock, coccyx pressure injuries, POA Etiology likely secondary to being found down for unknown amount of time at home. Pressure Injury 05/22/21 Buttocks Right Deep Tissue Pressure Injury - Purple or maroon localized area of discolored intact skin or blood-filled blister due to damage of underlying soft tissue from pressure and/or shear. (Active)  05/22/21 0300  Location: Buttocks  Location Orientation: Right  Staging: Deep Tissue Pressure Injury - Purple or maroon localized area of discolored intact skin or blood-filled blister due to damage of underlying soft tissue from pressure and/or shear.  Wound Description (Comments):   Present on Admission: Yes     Pressure Injury 05/22/21 Coccyx Stage 2 -  Partial thickness loss of dermis presenting as a shallow open injury with a red, pink wound bed without slough. (Active)  05/22/21 0300  Location: Coccyx  Location Orientation:   Staging: Stage 2 -  Partial thickness loss of dermis presenting as a shallow open injury with a  red, pink wound bed without slough.  Wound Description (Comments):   Present on Admission: Yes     Pressure Injury 05/22/21 Cervical Deep Tissue Pressure Injury - Purple or maroon localized area of discolored intact skin or blood-filled blister due to damage of underlying soft tissue from pressure and/or shear. (Active)  05/22/21 0300  Location: Cervical  Location Orientation:   Staging: Deep Tissue Pressure Injury - Purple or maroon localized area of discolored intact skin or blood-filled blister due to damage of underlying soft tissue from pressure and/or shear.  Wound Description (Comments):   Present on Admission: Yes     Pressure Injury 06/05/21 Buttocks Left Unstageable - Full thickness tissue loss in which the base of the injury is covered by slough (yellow, tan, gray, green or brown) and/or eschar (tan, brown or black) in the wound bed. Long curved wound on whole left (Active)  06/05/21 0622  Location: Buttocks  Location Orientation: Left  Staging: Unstageable - Full thickness tissue loss in which the base of the injury is covered by slough (yellow, tan, gray, green or brown) and/or eschar (tan, brown or black) in the wound bed.  Wound Description (Comments): Long curved wound on whole left buttocks. black yellow red pink and bloody  Present on Admission:   --continue local wound care, offloading, Foley catheter placed to ensure proper wound healing and decrease wetness    DVT prophylaxis: Place and maintain sequential compression device Start: 06/04/21 1538 SCDs Start: 05/22/21  0156   Code Status: Partial Code Family Communication: No family present at bedside this morning.  Disposition Plan:  Level of care: Med-Surg Status is: Inpatient  Remains inpatient appropriate because: Endometrial biopsy 10/28, will need SNF/LTACH vs possible transition to comfort measures/hospice.    Consultants:  Neurosurgery Neurology Cardiology Infectious disease PCCM Palliative  care Gastroenterology OB/GYN  Procedures:  TTE VA shunt removal with placement left parietal approach ventriculostomy 10/11, neurosurgery, Dr. Izora Ribas Extubated 10/11 EVD removal 10/19 Foley catheter placement 10/26  Antimicrobials:  Ceftriaxone 10/13 -10/20 Linezolid 10/19 - 10/27 Vancomycin 10/10 - 10/12 Cefepime 10/10 - 10/12 Metronidazole 10/10 - 10/10    Subjective: Patient seen examined bedside, resting comfortably.  No family present at bedside.  Minimal verbal interaction.  Nods head and moves head side to side, but unclear if appropriate to questioning.  Pelvic care talk with daughter this morning patient and now declines PEG tube placement; but wishes to proceed with endometrial biopsy.  Remains chronically ill with overall poor prognosis; likely best interest to transition to comfort measures/hospice.  Palliative care to discuss later this afternoon with daughter once again. No acute concerns overnight per nursing staff.  Objective: Vitals:   06/08/21 0500 06/08/21 0559 06/08/21 0714 06/08/21 1105  BP:  (!) 149/69 (!) 157/85 (!) 152/72  Pulse:  (!) 102 100 82  Resp:  _0 Temp:  97.8 F (36.6 C)    TempSrc:  Oral    SpO2:  100% 100% 100%  Weight: 87.9 kg     Height:        Intake/Output Summary (Last 24 hours) at 06/08/2021 1220 Last data filed at 06/08/2021 9735 Gross per 24 hour  Intake 180 ml  Output 850 ml  Net -670 ml   Filed Weights   06/06/21 0500 06/07/21 0500 06/08/21 0500  Weight: 88.8 kg 88.4 kg 87.9 kg    Examination:  General exam: Appears calm and comfortable, chronically ill in appearance, appears older than stated age Respiratory system: Clear to auscultation. Respiratory effort normal.  On room air Cardiovascular system: S1 & S2 heard, RRR. No JVD, murmurs, rubs, gallops or clicks. No pedal edema.  Port-A-Cath noted to chest Gastrointestinal system: Abdomen is nondistended, soft and nontender. No organomegaly or masses felt.  Normal bowel sounds heard.  NG tube noted. Central nervous system: Alert.  Left hemiparesis, minimal movement right extremities independent Extremities: Symmetric 5 x 5 power. Skin: No rashes, lesions or ulcers Psychiatry: Judgement and insight appear poor. Mood & affect appropriate.     Data Reviewed: I have personally reviewed following labs and imaging studies  CBC: Recent Labs  Lab 06/04/21 0430 06/07/21 0615  WBC 14.1* 7.1  HGB 10.7* 10.3*  HCT 32.8* 30.8*  MCV 100.9* 98.7  PLT 325 329   Basic Metabolic Panel: Recent Labs  Lab 06/02/21 0509 06/07/21 0615  NA 138 137  K 4.7 4.6  CL 101 101  CO2 30 30  GLUCOSE 126* 121*  BUN 22 28*  CREATININE 0.75 0.80  CALCIUM 8.0* 7.9*   GFR: Estimated Creatinine Clearance: 74.1 mL/min (by C-G formula based on SCr of 0.8 mg/dL). Liver Function Tests: No results for input(s): AST, ALT, ALKPHOS, BILITOT, PROT, ALBUMIN in the last 168 hours. No results for input(s): LIPASE, AMYLASE in the last 168 hours. No results for input(s): AMMONIA in the last 168 hours. Coagulation Profile: No results for input(s): INR, PROTIME in the last 168 hours. Cardiac Enzymes: No results for input(s): CKTOTAL, CKMB, CKMBINDEX,  TROPONINI in the last 168 hours. BNP (last 3 results) No results for input(s): PROBNP in the last 8760 hours. HbA1C: No results for input(s): HGBA1C in the last 72 hours. CBG: Recent Labs  Lab 06/07/21 1123 06/07/21 1725 06/07/21 2000 06/08/21 0603 06/08/21 0755  GLUCAP 126* 110* 124* 97 97   Lipid Profile: No results for input(s): CHOL, HDL, LDLCALC, TRIG, CHOLHDL, LDLDIRECT in the last 72 hours. Thyroid Function Tests: No results for input(s): TSH, T4TOTAL, FREET4, T3FREE, THYROIDAB in the last 72 hours. Anemia Panel: No results for input(s): VITAMINB12, FOLATE, FERRITIN, TIBC, IRON, RETICCTPCT in the last 72 hours. Sepsis Labs: No results for input(s): PROCALCITON, LATICACIDVEN in the last 168 hours.  No  results found for this or any previous visit (from the past 240 hour(s)).       Radiology Studies: No results found.      Scheduled Meds:  aspirin  81 mg Per Tube Daily   chlorhexidine gluconate (MEDLINE KIT)  15 mL Mouth Rinse BID   Chlorhexidine Gluconate Cloth  6 each Topical Daily   collagenase   Topical Daily   free water  30 mL Per Tube Q4H   Gerhardt's butt cream   Topical TID   metoprolol tartrate  25 mg Oral BID   pantoprazole sodium  40 mg Per Tube Daily   sodium chloride flush  10-40 mL Intracatheter Q12H   thiamine  100 mg Oral Daily   Continuous Infusions:  sodium chloride     feeding supplement (OSMOLITE 1.2 CAL) 1,000 mL (06/07/21 1039)     LOS: 17 days    Time spent: 38 minutes spent on chart review, discussion with nursing staff, consultants, updating family and interview/physical exam; more than 50% of that time was spent in counseling and/or coordination of care.    Desiree Mitchell J [place] DO [facility] Available via Epic secure chat 7am-7pm After these hours, please refer to coverage provider listed on [email or web address] 06/08/2021, 12:20 PM

## 2021-06-08 NOTE — Progress Notes (Signed)
GYN NOTE  Care plan for postmenopausal bleeding d/w pt, daughter  Continues to report vaginal bleeding intermittently Plan EMB today while under sedation for GI procedure Will plan Gyn Onc consultation if results endometrial cancer; consideration then would be progesterone therapy vs surgery Will plan low dose progesterone therapy for prolonged bleeding, if benign results.  Alternative would be a hysteroscopy procedure to remove possible polyp as evidenced by ultrasound, as able.  Barnett Applebaum, MD, [facility], [facility] 06/08/2021  7:47 AM

## 2021-06-08 NOTE — TOC Progression Note (Signed)
Transition of Care [facility]) - Progression Note    Patient Details  Name: Desiree Mitchell MRN: [id number] Date of Birth: Mar 13, 1956  [facility] CM/SW Contact  Kerin Salen, RN Phone Number: 06/08/2021, 2:41 PM  Clinical Narrative: Called and spoke with Sonia Baller from Hospice who will assess patient for residential hospice. Sonia Baller did say there was not a bed available today and probably tomorrow.    Expected Discharge Plan: Long Term Acute Care (LTAC) Barriers to Discharge: Continued Medical Work up  Expected Discharge Plan and Services Expected Discharge Plan: Long Term Acute Care (LTAC) In-house Referral: Clinical Social Work   Post Acute Care Choice: Long Term Acute Care (LTAC) Living arrangements for the past 2 months: Single Family Home                                       Social Determinants of Health (SDOH) Interventions    Readmission Risk Interventions No flowsheet data found.

## 2021-06-08 NOTE — Progress Notes (Signed)
Physical Therapy Treatment Patient Details Name: Desiree Mitchell MRN: [id number] DOB: [birth year] Today's Date: 06/08/2021   History of Present Illness Pt is a 65 y.o. female presenting to hospital 05/21/21 with AMS (found unresponsive by family).  Significant pressure sore present back of neck, buttocks, and coccyx.  Pt noted to be infested with bed bugs and roaches.  Pt admitted with acute respiratory failure, rhabdomyolysis, metabolic acidosis, acute kidney injury, sepsis, VA shunt malfunction, transaminitis, and posterior neck/buttocks/coccyx pressure injuries.  S/p removal ventriculoatrial shunt and placement of L parietal approach ventriculostomy 05/22/21; extubated 10/11; MRI brain 10/14 showing few scattered small acute infarcts involving different vascular territories (watershed type infarcts); TEE 10/18; and EVD removed 10/19.    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    PT Comments    PT/OT co treat 2/2 to pt requiring +2 assistance for all mobility/transfers. Pt required +2 assist to exit bed toward L via log roll technique. She performed several there ex at EOB prior to standing +2 max assist 2 x. Poor standing posture and tolerance. Pt fatigues quickly and will need extensive PT going forward to assist her with returning to PLOF. Highly recommend DC to SNF to address deficits while maximizing independence with ADLs.     Recommendations for follow up therapy are one component of a multi-disciplinary discharge planning process, led by the attending physician.  Recommendations may be updated based on patient status, additional functional criteria and insurance authorization.  Follow Up Recommendations  Skilled nursing-short term rehab (<3 hours/day)     Assistance Recommended at Discharge Frequent or constant Supervision/Assistance  Equipment Recommendations  Other (comment) (defer to next level of care)        Precautions / Restrictions Precautions Precautions: Fall Precaution Comments: Aspiration; gastric tube; rectal tube; R chest port; posterior neck, buttocks, and coccyx pressure injuries     Mobility  Bed Mobility Overal bed mobility: Needs Assistance Bed Mobility: Supine to Sit;Sit to Supine     Supine to sit: Max assist;+2 for physical assistance Sit to supine: Max assist;+2 for physical assistance        Transfers Overall transfer level: Needs assistance Equipment used: Rolling walker (2 wheels)   Sit to Stand: Max assist;+2 physical assistance          Lateral/Scoot Transfers: Max assist;+2 physical assistance;From elevated surface General transfer comment: Pt able to clear hips from bed with MAX A+2, but unable to achieve upright stance    Ambulation/Gait            General Gait Details: unable/unsafe to trial at this time     Balance Overall balance assessment: Needs assistance Sitting-balance support: Feet supported;Bilateral upper extremity supported Sitting balance-Leahy Scale: Poor Sitting balance - Comments: Intermittently requires MIN assist d/t posterior/forward lean when fatigued. CGA at times with pt's R UE holding onto bottom bedrail; vc's for technique Postural control: Posterior lean Standing balance support: Bilateral upper extremity supported;During functional activity;Reliant on assistive device for balance Standing balance-Leahy Scale: Zero Standing balance comment: +2 MAX assist to stand from elevated bed height. unable to remain standing without +2 assist        Cognition Arousal/Alertness: Awake/alert Behavior During Therapy: Flat affect Overall Cognitive Status: No family/caregiver present to determine baseline cognitive functioning        General Comments: per OT, pt cognition is better however continues to present with severe deficits in alertness and orientation  Pertinent Vitals/Pain Faces Pain Scale: No  hurt Breathing: normal Negative Vocalization: none Facial Expression: smiling or inexpressive Body Language: relaxed Consolability: no need to console PAINAD Score: 0     PT Goals (current goals can now be found in the care plan section) Acute Rehab PT Goals Patient Stated Goal: none stated    Frequency    Min 2X/week      PT Plan Current plan remains appropriate       AM-PAC PT "6 Clicks" Mobility   Outcome Measure  Help needed turning from your back to your side while in a flat bed without using bedrails?: Total Help needed moving from lying on your back to sitting on the side of a flat bed without using bedrails?: Total Help needed moving to and from a bed to a chair (including a wheelchair)?: Total Help needed standing up from a chair using your arms (e.g., wheelchair or bedside chair)?: Total Help needed to walk in hospital room?: Total Help needed climbing 3-5 steps with a railing? : Total 6 Click Score: (P) 6    End of Session Equipment Utilized During Treatment: Oxygen;Other (comment) (feeding tube/ foley/ rectal tube) Activity Tolerance: Patient limited by fatigue Patient left: in bed;with call bell/phone within reach;with bed alarm set;Other (comment) Nurse Communication: Mobility status;Precautions PT Visit Diagnosis: Other abnormalities of gait and mobility (R26.89);Muscle weakness (generalized) (M62.81);Difficulty in walking, not elsewhere classified (R26.2)     Time:  -     Charges:                        Julaine Fusi PTA 06/08/21, 11:45 AM

## 2021-06-08 NOTE — Care Management Important Message (Signed)
Important Message  Patient Details  Name: Nichele Slawson MRN: [id number] Date of Birth: Apr 24, 1956   Medicare Important Message Given:  Yes  Patient was sleeping so I left a copy of the Important Message from Medicare on her bedside table to review at her convenience.  Juliann Pulse A Mikeya Tomasetti 06/08/2021, 1:39 PM

## 2021-06-08 NOTE — Progress Notes (Addendum)
Nutrition Brief Note  Chart reviewed. Pt now transitioning to comfort care.  No further nutrition interventions planned at this time.  Please re-consult as needed.   Leilanee Righetti W, RD, LDN, CDCES Registered Dietitian II Certified Diabetes Care and Education Specialist Please refer to [facility] for RD and/or RD on-call/weekend/after hours pager   

## 2021-06-08 NOTE — Progress Notes (Signed)
[facility]) Hospital Liaison Note   Received request from Transitions of Care Manager for family interest in [place]. Chart reviewed and spoke with family to acknowledge referral. Patient hospice eligibility status is pending. Unfortunately, [facility] is not able to offer a room today. Patient and Transitions of Care Manager aware hospital liaison will follow up tomorrow or sooner if room becomes available. Please do not hesitate to call with questions.    Please call with any questions/concerns.    Thank you for the opportunity to participate in this patient's care.   Daphene Calamity, MSW [facility] Liaison  [phone]

## 2021-06-08 NOTE — Progress Notes (Addendum)
Palliative Care Progress Note, Assessment & Plan   Patient Name: Desiree Mitchell       Date: 06/08/2021 DOB: 1956-07-10  Age: 65 y.o. MRN#: [id number] Attending Physician: [place] Eric J, DO Primary Care Physician: Pcp, No Admit Date: 05/21/2021  Reason for Consultation/Follow-up: Establishing goals of care  HPI: 65 y.o. 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     Patient underwent a removal of shunt and placement of EVD.  Patient was successfully extubated on 10/11.  Neurology is consulted for multiple infarcts.  Gynecology has been consulted for abnormal uterine bleeding.  Gastroenterology has been consulted for potential placement of PEG tube.  She has multiple pressure injuries and wound ostomy team's been consulted.    PMT was consulted for [place] discussion.  Plan of Care: I have reviewed medical records including EPIC notes, labs and imaging, received report from nursing, assessed the patient and then met with patient at bedside  to discuss diagnosis prognosis, GOC, EOL wishes, disposition and options.   I asked her if she knew she was going to have a PEG tube placed today.  She said no.  I shared that a PEG tube is an artificial means of placing a tube into her stomach to make sure she is receiving artificial nutrition.  She said she did not want that.    I again outlined that she would not be able to get nutrition and by any other means to meet her body's needs.  She again said she did not want a feeding tube.  I asked her if I could review her options with her daughter [name].  She said she did not think  that [place] cared.   I asked her if she knew her name which she did.  I conveyed that should we moved forward with not placing a PEG tube we would need to remove the tube in her nose and focus on a comfort pathway.  I outlined what a comfort path looks like.  The patient started to look other directions and not respond to our discussions as she previously had. I appreciate that her mentation waxes and wanes.  So, I spoke with the patient's daughter [name].    I spoke with [name] on the phone and I again highlighted the risks and benefits associated with the PEG tube placement.  She said she wants whatever is her mother's best interest.  She recalls that her mother stated years ago, after witnessing her own father having a PEG tube, that she would never want that for herself.  Given the patient has told me that she does not want a PEG tube and [name] has conveyed that the patient herself stated years ago she would not want one, I asked again if [name] would want to place a PEG tube.  She said she did not think that it would be in her mother's best interest.  [name] would like to move forward with endometrial biopsy.  [name] was at work and getting ready to leave.  I told her I  would convey her wishes to not move forward with a PEG tube to the team and discuss the comfort care option with her this afternoon.  I notified Dr. [place] Dr. Virgina Jock, [place], and bedside RN Lenna Sciara that patient's daugther does not want to move forward with a PEG tube but would like to move forward with endometrial biopsy.  Questions and concerns were addressed. The family was encouraged to call with questions or concerns.   Follow up in afternoon: 145pm -I again spoke with [name] on the phone.  I shared that without the placement of a PEG tube that the patient would benefit from a full comfort pathway.  I outlined that this would keep the patient's symptoms and comfort as the focus without increasing medical intervention.  We would  not escalate care.  We would ensure she would stay clean, dry, and address any symptoms of pain, difficulty breathing, and agitation.  [name] said she agreed with this and just wants what is best for her mother.  I shared that I did not know what this meant as far as her mother being discharged from the hospital.  [name] shared that she cannot return home since family would only be able to check on the patient on the weekends.  She said she is unable to manage her in her own home.  I shared that we would likely be moving her to a skilled facility or a hospice inpatient facility if she is eligible and beds are available.  [name] was in agreement with this plan.  I let her know that these plans would be made, patient will be moved to full comfort care, and that someone would ultimately be in touch from the transitions of care team and/or hospice to review disposition options. She said she appreciated all of the medical team's efforts. She lost her father's father this past Wednesday and reports she is under a lot of stress. I assured her we would continue to give her mother optimal comfort care and communicate with [name] with any decline or changes.  Code Status: DNR  Prognosis: Less than 6 weeks  Discharge Planning: SNF with Hospice or [place]  Recommendations/Plan: No endometrial biopsy No PEG placement Full comfort care Maintain foley Maintain rectal tube unless it uncomfortable or not necessary for small BM output, then remove Transfer to hospice facility if possible  Care plan was discussed with patient, patient's daughter, attending Dr. [place] consulting GI Dr. Virgina Jock, [place] Chauncey Mann, bedside RN Melissa  Length of Stay: 17  Physical Exam Vitals and nursing note reviewed.  HENT:     Head: Normocephalic.     Mouth/Throat:     Mouth: Mucous membranes are dry.  Cardiovascular:     Rate and Rhythm: Normal rate.  Abdominal:     Palpations: Abdomen is soft.  Skin:     General: Skin is warm and dry.  Neurological:     Mental Status: She is alert. Mental status is at baseline.  Psychiatric:        Behavior: Behavior normal.        Thought Content: Thought content normal.            Vital Signs: BP (!) 152/72 (BP Location: Left Leg)   Pulse 82   Temp 97.8 F (36.6 C) (Oral)   Resp 18   Ht 5' 2"  (1.575 m)   Wt 87.9 kg   SpO2 100%   BMI 35.44 kg/m  SpO2: SpO2: 100 % O2 Device: O2  Device: Nasal Cannula O2 Flow Rate: O2 Flow Rate (L/min): 2 L/min      Palliative Assessment/Data: 20-30%    Flowsheet Rows    Flowsheet Row Most Recent Value  Intake Tab   Referral Department Hospitalist  Unit at Time of Referral ICU  Palliative Care Primary Diagnosis Neurology  Date Notified 05/24/21  Palliative Care Type New Palliative care  Reason for referral Clarify Goals of Care  Date of Admission 05/21/21  Date first seen by Palliative Care 05/24/21  # of days Palliative referral response time 0 Day(s)  # of days IP prior to Palliative referral 3  Clinical Assessment   Palliative Performance Scale Score 20%  Pain Max last 24 hours Not able to report  Pain Min Last 24 hours Not able to report  Dyspnea Max Last 24 Hours Not able to report  Dyspnea Min Last 24 hours Not able to report  Psychosocial & Spiritual Assessment   Palliative Care Outcomes         Total Time 75 minutes    Greater than 50%  of this time was spent counseling and coordinating care related to the above assessment and plan.  Thank you for allowing the Palliative Medicine Team to assist in the care of this patient.  Otsego Ilsa Iha, FNP-BC Palliative Medicine Team Team Phone # [phone]

## 2021-06-08 NOTE — Progress Notes (Signed)
SLP Cancellation Note  Patient Details Name: Desiree Mitchell MRN: [id number] DOB: 13-Jun-1956   Cancelled treatment:       Reason Eval/Treat Not Completed: Medical issues which prohibited therapy (Pt NPO for tentative PEG placement this date.)  Will plan to f/u the week of 06/11/21.  Clearnce Sorrel [name] 06/08/2021, 11:26 AM

## 2021-06-09 DIAGNOSIS — J9601 Acute respiratory failure with hypoxia: Secondary | ICD-10-CM | POA: Diagnosis not present

## 2021-06-09 DIAGNOSIS — Z66 Do not resuscitate: Secondary | ICD-10-CM | POA: Diagnosis not present

## 2021-06-09 DIAGNOSIS — I639 Cerebral infarction, unspecified: Secondary | ICD-10-CM | POA: Diagnosis not present

## 2021-06-09 DIAGNOSIS — G9340 Encephalopathy, unspecified: Secondary | ICD-10-CM | POA: Diagnosis not present

## 2021-06-09 DIAGNOSIS — A419 Sepsis, unspecified organism: Secondary | ICD-10-CM | POA: Diagnosis not present

## 2021-06-09 NOTE — Progress Notes (Signed)
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  Patient was last seen normal on 05/19/2021 at 4 PM.  On EMS arrival, patient was noted be hypoxic with SPO2 in the 70s with a normal blood sugar.  EMS with difficulty obtaining blood pressure and patient received 300 mL of LR, 40 mg of lidocaine, 25 mg of Phenergan and an IO was placed in the right humeral head.  Family later reported to EDP that they found her sitting on the toilet slumped with her neck up against a basket.  Of note patient arrived infested with bedbugs and roaches.  In the ED, temperature 91.1 F, HR 95, RR 21, BP 169/123, SPO2 95% on right nonrebreather 15 L.  Sodium 137, potassium 4.8, chloride 97, BUN/creatinine 84/2.1, CO2 17, anion gap 23, AST 155, ALT 59, CK 6755.  WBC 30.3, hemoglobin 15.9, high sensory troponin 22, lactic acid 2.4.  COVID-19 PCR negative.  Influenza A/B PCR negative.  VBG with pH 7.21, PO2 53, PCO2 32.  Chest x-ray with mild left basilar atelectasis without evidence of acute infiltrate.  CT head without contrast interval development of mild hydrocephalus and periventricular edema associated with malpositioned left VA shunt that terminates in the anterior mediastinum and no longer within the left brachiocephalic vein, shunt no to be coiled within the neck, GGO right lower lobe and patchy airspace opacity within the left lower lobe.  CT C-spine with no acute fracture or traumatic injury noted.  Neurosurgery was consulted and patient was taken emergently to the OR by Dr. Izora Ribas for removal of her VA shunt and placement of  external ventricular drain.  Patient was initially admitted to the PCCM service and transferred to hospitalist service on 05/30/2021.   Assessment & Plan:   Principal Problem:   Sepsis ([place]) Active Problems:   Endotracheally intubated   On mechanically assisted ventilation (HCC)   Shunt malfunction   Rhabdomyolysis   AKI (acute kidney injury) ([place])   Increased anion gap metabolic acidosis   Acute encephalopathy   Transaminitis   Pressure injury of skin   Acute embolic stroke [facility])   Palliative care by specialist   Full code status   Severe sepsis, POA Proteus/staph epidermis septicemia Patient was seen by infectious disease, etiology suspected from her chronic wounds.  No evidence of VP shunt infection with negative CSF cultures.  TTE unrevealing.  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 Wandalee Ferdinand low risk for endocarditis.  Completed 10-day course of ceftriaxone and 14-day course of linezolid.  Infectious disease now signed off.  Now on comfort measures awaiting placement.  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  Patient was taken emergently to the OR by neurosurgery, Dr. Izora Ribas on 10/11 with complete removal of ventricular atrial shunt and placement of left parietal approach ventriculostomy.  External ventriculostomy drain removed by neurosurgery on 05/30/2021.  CSF cultures negative.  No further intervention planned by neurosurgery team  at this time.  NG tube discontinued, declined PEG tube placement and tube feeds now discontinued.  On comfort measures pending residential hospice placement.  Acute hypoxic respiratory failure, postoperative: Resolved Following shunt removal and EVD placement, patient remained on  mechanical ventilation.  Patient now extubated and weaned to room air.  Multifocal strokes Pain with multifocal infarcts noted, concerning for embolic in nature.  Neurology was consulted.  TTE unrevealing.  Unable to undergo TEE per cardiology due to anatomic abnormalities of epiglottis.  Palliative care was consulted and followed during hospital course, patient has now declined PEG tube and transition to comfort measures.  Discontinued aspirin.  Essential hypertension Discontinued antihypertensives is now transition to comfort measures.  Low normal B12 Vitamin B12 level, 214.  Homocysteine level 17.0, within normal limits.  Patient received IM vitamin B 12 supplementation x3 during hospitalization.  Now transition to comfort measures as above.  Rhabdomyolysis Etiology likely secondary to prolonged downtime.  Received IV fluid hydration.  Improved.  Dysphagia Patient and daughter now declined PEG tube placement and transition to comfort care.  NG tube was removed and tube feedings discontinued.  Continue comfort meds as tolerates.  GERD: PPI discontinued now on comfort measures  Abnormal uterine bleeding Vaginal ultrasound with endometrial polyp, vascular stalk.  GI was consulted and was planning endometrial biopsy but now discontinued as transition to comfort measures.  Posterior neck, buttock, coccyx pressure injuries, POA Etiology likely secondary to being found down for unknown amount of time at home. Pressure Injury 05/22/21 Buttocks Right Deep Tissue Pressure Injury - Purple or maroon localized area of discolored intact skin or blood-filled blister due to damage of underlying soft tissue from pressure and/or shear. (Active)  05/22/21 0300  Location: Buttocks  Location Orientation: Right  Staging: Deep Tissue Pressure Injury - Purple or maroon localized area of discolored intact skin or blood-filled blister due to damage of underlying soft tissue from pressure and/or shear.  Wound  Description (Comments):   Present on Admission: Yes     Pressure Injury 05/22/21 Coccyx Stage 2 -  Partial thickness loss of dermis presenting as a shallow open injury with a red, pink wound bed without slough. (Active)  05/22/21 0300  Location: Coccyx  Location Orientation:   Staging: Stage 2 -  Partial thickness loss of dermis presenting as a shallow open injury with a red, pink wound bed without slough.  Wound Description (Comments):   Present on Admission: Yes     Pressure Injury 05/22/21 Cervical Deep Tissue Pressure Injury - Purple or maroon localized area of discolored intact skin or blood-filled blister due to damage of underlying soft tissue from pressure and/or shear. (Active)  05/22/21 0300  Location: Cervical  Location Orientation:   Staging: Deep Tissue Pressure Injury - Purple or maroon localized area of discolored intact skin or blood-filled blister due to damage of underlying soft tissue from pressure and/or shear.  Wound Description (Comments):   Present on Admission: Yes     Pressure Injury 06/05/21 Buttocks Left Unstageable - Full thickness tissue loss in which the base of the injury is covered by slough (yellow, tan, gray, green or brown) and/or eschar (tan, brown or black) in the wound bed. Long curved wound on whole left (Active)  06/05/21 0622  Location: Buttocks  Location Orientation: Left  Staging: Unstageable - Full thickness tissue loss in which the base of the injury is covered by slough (yellow, tan, gray, green or brown) and/or eschar (tan, brown or black) in the wound bed.  Wound Description (Comments): Long curved wound on whole left buttocks. black yellow red pink and bloody  Present on Admission:   --continue local wound care, offloading, Foley catheter placed to ensure proper wound healing and decrease wetness    DVT prophylaxis:   Comfort measures   Code Status: DNR Family Communication: No family present at bedside this morning.  Disposition  Plan:  Level of care: Med-Surg Status is: Inpatient  Remains inpatient appropriate because: Endometrial biopsy 10/28, will need SNF/LTACH vs possible transition to comfort measures/hospice.    Consultants:  Neurosurgery Neurology Cardiology Infectious disease PCCM Palliative care Gastroenterology OB/GYN  Procedures:  TTE VA shunt removal with placement left parietal approach ventriculostomy 10/11, neurosurgery, Dr. Izora Ribas Extubated 10/11 EVD removal 10/19 Foley catheter placement 10/26  Antimicrobials:  Ceftriaxone 10/13 -10/20 Linezolid 10/19 - 10/27 Vancomycin 10/10 - 10/12 Cefepime 10/10 - 10/12 Metronidazole 10/10 - 10/10    Subjective: Patient seen examined bedside, resting comfortably.  No family present at bedside.  States " like something to eat".  Awaiting residential hospice placement.  No other questions or concerns at this time.  No acute events overnight per nurse staff.  Objective: Vitals:   06/08/21 0559 06/08/21 0714 06/08/21 1105 06/08/21 1626  BP: (!) 149/69 (!) 157/85 (!) 152/72 (!) 159/81  Pulse: (!) 102 100 82 (!) 107  Resp: 20 17 18 20   Temp: 97.8 F (36.6 C)   98.7 F (37.1 C)  TempSrc: Oral   Oral  SpO2: 100% 100% 100% 99%  Weight:      Height:        Intake/Output Summary (Last 24 hours) at 06/09/2021 1003 Last data filed at 06/09/2021 0935 Gross per 24 hour  Intake --  Output 175 ml  Net -175 ml   Filed Weights   06/06/21 0500 06/07/21 0500 06/08/21 0500  Weight: 88.8 kg 88.4 kg 87.9 kg    Examination:  General exam: Appears calm and comfortable, chronically ill in appearance, appears older than stated age Respiratory system: Clear to auscultation. Respiratory effort normal.  On room air Cardiovascular system: S1 & S2 heard, RRR. No JVD, murmurs, rubs, gallops or clicks. No pedal edema.  Port-A-Cath noted to chest Gastrointestinal system: Abdomen is nondistended, soft and nontender. No organomegaly or masses felt.  Normal bowel sounds heard.   Central nervous system: Alert.  Left hemiparesis, minimal movement right extremities independent Extremities: Symmetric 5 x 5 power. Skin: No rashes, lesions or ulcers Psychiatry: Judgement and insight appear poor. Mood & affect appropriate.     Data Reviewed: I have personally reviewed following labs and imaging studies  CBC: Recent Labs  Lab 06/04/21 0430 06/07/21 0615  WBC 14.1* 7.1  HGB 10.7* 10.3*  HCT 32.8* 30.8*  MCV 100.9* 98.7  PLT 325 240   Basic Metabolic Panel: Recent Labs  Lab 06/07/21 0615  NA 137  K 4.6  CL 101  CO2 30  GLUCOSE 121*  BUN 28*  CREATININE 0.80  CALCIUM 7.9*   GFR: Estimated Creatinine Clearance: 74.1 mL/min (by C-G formula based on SCr of 0.8 mg/dL). Liver Function Tests: No results for input(s): AST, ALT, ALKPHOS, BILITOT, PROT, ALBUMIN in the last 168 hours. No results for input(s): LIPASE, AMYLASE in the last 168 hours. No results for input(s): AMMONIA in the last 168 hours. Coagulation Profile: No results for input(s): INR, PROTIME in the last 168 hours. Cardiac Enzymes: No results for input(s): CKTOTAL, CKMB, CKMBINDEX, TROPONINI in the last 168 hours. BNP (last 3 results) No  results for input(s): PROBNP in the last 8760 hours. HbA1C: No results for input(s): HGBA1C in the last 72 hours. CBG: Recent Labs  Lab 06/07/21 1725 06/07/21 2000 06/08/21 0603 06/08/21 0755 06/08/21 1623  GLUCAP 110* 124* 97 97 83   Lipid Profile: No results for input(s): CHOL, HDL, LDLCALC, TRIG, CHOLHDL, LDLDIRECT in the last 72 hours. Thyroid Function Tests: No results for input(s): TSH, T4TOTAL, FREET4, T3FREE, THYROIDAB in the last 72 hours. Anemia Panel: No results for input(s): VITAMINB12, FOLATE, FERRITIN, TIBC, IRON, RETICCTPCT in the last 72 hours. Sepsis Labs: No results for input(s): PROCALCITON, LATICACIDVEN in the last 168 hours.  No results found for this or any previous visit (from the past 240  hour(s)).       Radiology Studies: No results found.      Scheduled Meds:  collagenase   Topical Daily   Gerhardt's butt cream   Topical TID   Continuous Infusions:     LOS: 18 days    Time spent: 35 minutes spent on chart review, discussion with nursing staff, consultants, updating family and interview/physical exam; more than 50% of that time was spent in counseling and/or coordination of care.    Adayah Arocho J [place] DO [facility] Available via Epic secure chat 7am-7pm After these hours, please refer to coverage provider listed on [email or web address] 06/09/2021, 10:03 AM

## 2021-06-09 NOTE — Progress Notes (Signed)
Pt is tolerating applesauce crackers jello pudding ensures. Pt states she feels hungry. Paged MD [place] requested   pureed diet. See new orders

## 2021-06-10 DIAGNOSIS — N179 Acute kidney failure, unspecified: Secondary | ICD-10-CM | POA: Diagnosis not present

## 2021-06-10 DIAGNOSIS — G9340 Encephalopathy, unspecified: Secondary | ICD-10-CM | POA: Diagnosis not present

## 2021-06-10 DIAGNOSIS — A419 Sepsis, unspecified organism: Secondary | ICD-10-CM | POA: Diagnosis not present

## 2021-06-10 DIAGNOSIS — I639 Cerebral infarction, unspecified: Secondary | ICD-10-CM | POA: Diagnosis not present

## 2021-06-10 NOTE — Progress Notes (Signed)
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  Patient was last seen normal on 05/19/2021 at 4 PM.  On EMS arrival, patient was noted be hypoxic with SPO2 in the 70s with a normal blood sugar.  EMS with difficulty obtaining blood pressure and patient received 300 mL of LR, 40 mg of lidocaine, 25 mg of Phenergan and an IO was placed in the right humeral head.  Family later reported to EDP that they found her sitting on the toilet slumped with her neck up against a basket.  Of note patient arrived infested with bedbugs and roaches.  In the ED, temperature 91.1 F, HR 95, RR 21, BP 169/123, SPO2 95% on right nonrebreather 15 L.  Sodium 137, potassium 4.8, chloride 97, BUN/creatinine 84/2.1, CO2 17, anion gap 23, AST 155, ALT 59, CK 6755.  WBC 30.3, hemoglobin 15.9, high sensory troponin 22, lactic acid 2.4.  COVID-19 PCR negative.  Influenza A/B PCR negative.  VBG with pH 7.21, PO2 53, PCO2 32.  Chest x-ray with mild left basilar atelectasis without evidence of acute infiltrate.  CT head without contrast interval development of mild hydrocephalus and periventricular edema associated with malpositioned left VA shunt that terminates in the anterior mediastinum and no longer within the left brachiocephalic vein, shunt no to be coiled within the neck, GGO right lower lobe and patchy airspace opacity within the left lower lobe.  CT C-spine with no acute fracture or traumatic injury noted.  Neurosurgery was consulted and patient was taken emergently to the OR by Dr. Izora Ribas for removal of her VA shunt and placement of  external ventricular drain.  Patient was initially admitted to the PCCM service and transferred to hospitalist service on 05/30/2021.   Assessment & Plan:   Principal Problem:   Sepsis ([place]) Active Problems:   Endotracheally intubated   On mechanically assisted ventilation (H[facility])   Shunt malfunction   Rhabdomyolysis   AKI (acute kidney injury) ([place])   Increased anion gap metabolic acidosis   Acute encephalopathy   Transaminitis   Pressure injury of skin   Acute embolic stroke ([place])   Palliative care by specialist   DNR (do not resuscitate)   Severe sepsis, POA Proteus/staph epidermis septicemia Patient was seen by infectious disease, etiology suspected from her chronic wounds.  No evidence of VP shunt infection with negative CSF cultures.  TTE unrevealing.  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 Wandalee Ferdinand low risk for endocarditis.  Completed 10-day course of ceftriaxone and 14-day course of linezolid.  Infectious disease now signed off.  Now on comfort measures awaiting placement.  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  Patient was taken emergently to the OR by neurosurgery, Dr. Izora Ribas on 10/11 with complete removal of ventricular atrial shunt and placement of left parietal approach ventriculostomy.  External ventriculostomy drain removed by neurosurgery on 05/30/2021.  CSF cultures negative.  No further intervention planned by neurosurgery  team at this time.  NG tube discontinued, declined PEG tube placement and tube feeds now discontinued.  On comfort measures pending residential hospice placement.  Acute hypoxic respiratory failure, postoperative: Resolved Following shunt removal and EVD placement, patient remained on  mechanical ventilation.  Patient now extubated and weaned to room air.  Multifocal strokes Pain with multifocal infarcts noted, concerning for embolic in nature.  Neurology was consulted.  TTE unrevealing.  Unable to undergo TEE per cardiology due to anatomic abnormalities of epiglottis.  Palliative care was consulted and followed during hospital course, patient has now declined PEG tube and transition to comfort measures.  Discontinued aspirin.  Essential hypertension Discontinued antihypertensives is now transition to comfort measures.  Low normal B12 Vitamin B12 level, 214.  Homocysteine level 17.0, within normal limits.  Patient received IM vitamin B 12 supplementation x3 during hospitalization.  Now transition to comfort measures as above.  Rhabdomyolysis Etiology likely secondary to prolonged downtime.  Received IV fluid hydration.  Improved.  Dysphagia Patient and daughter now declined PEG tube placement and transition to comfort care.  NG tube was removed and tube feedings discontinued.  Continue comfort meds as tolerates.  GERD: PPI discontinued now on comfort measures  Abnormal uterine bleeding Vaginal ultrasound with endometrial polyp, vascular stalk.  GI was consulted and was planning endometrial biopsy but now discontinued as transition to comfort measures.  Posterior neck, buttock, coccyx pressure injuries, POA Etiology likely secondary to being found down for unknown amount of time at home. Pressure Injury 05/22/21 Buttocks Right Deep Tissue Pressure Injury - Purple or maroon localized area of discolored intact skin or blood-filled blister due to damage of underlying soft tissue from pressure and/or shear. (Active)  05/22/21 0300  Location: Buttocks  Location Orientation: Right  Staging: Deep Tissue Pressure Injury - Purple or maroon localized area of discolored intact skin or blood-filled blister due to damage of underlying soft tissue from pressure and/or shear.  Wound  Description (Comments):   Present on Admission: Yes     Pressure Injury 05/22/21 Coccyx Stage 2 -  Partial thickness loss of dermis presenting as a shallow open injury with a red, pink wound bed without slough. (Active)  05/22/21 0300  Location: Coccyx  Location Orientation:   Staging: Stage 2 -  Partial thickness loss of dermis presenting as a shallow open injury with a red, pink wound bed without slough.  Wound Description (Comments):   Present on Admission: Yes     Pressure Injury 05/22/21 Cervical Deep Tissue Pressure Injury - Purple or maroon localized area of discolored intact skin or blood-filled blister due to damage of underlying soft tissue from pressure and/or shear. (Active)  05/22/21 0300  Location: Cervical  Location Orientation:   Staging: Deep Tissue Pressure Injury - Purple or maroon localized area of discolored intact skin or blood-filled blister due to damage of underlying soft tissue from pressure and/or shear.  Wound Description (Comments):   Present on Admission: Yes     Pressure Injury 06/05/21 Buttocks Left Unstageable - Full thickness tissue loss in which the base of the injury is covered by slough (yellow, tan, gray, green or brown) and/or eschar (tan, brown or black) in the wound bed. Long curved wound on whole left (Active)  06/05/21 0622  Location: Buttocks  Location Orientation: Left  Staging: Unstageable - Full thickness tissue loss in which the base of the injury is covered by slough (yellow, tan, gray, green or brown) and/or eschar (tan, brown or black) in the wound  bed.  Wound Description (Comments): Long curved wound on whole left buttocks. black yellow red pink and bloody  Present on Admission:   --continue local wound care, offloading, Foley catheter placed to ensure proper wound healing and decrease wetness and for comfort    DVT prophylaxis:   Comfort measures   Code Status: DNR Family Communication: No family present at bedside this  morning.  Disposition Plan:  Level of care: Med-Surg Status is: Inpatient  Remains inpatient appropriate because: Pending residential hospice placement    Consultants:  Neurosurgery Neurology Cardiology Infectious disease PCCM Palliative care Gastroenterology OB/GYN  Procedures:  TTE VA shunt removal with placement left parietal approach ventriculostomy 10/11, neurosurgery, Dr. Izora Ribas Extubated 10/11 EVD removal 10/19 Foley catheter placement 10/26  Antimicrobials:  Ceftriaxone 10/13 -10/20 Linezolid 10/19 - 10/27 Vancomycin 10/10 - 10/12 Cefepime 10/10 - 10/12 Metronidazole 10/10 - 10/10    Subjective: Patient seen examined bedside, resting comfortably.  No family present at bedside.  States " had been better" and "leave me alone".  Awaiting residential hospice placement.  No other questions or concerns at this time.  No acute events overnight per nurse staff.  Objective: Vitals:   06/08/21 1626 06/09/21 1110 06/10/21 0512 06/10/21 1011  BP: (!) 159/81 (!) 152/90 (!) 162/91 (!) 152/93  Pulse: (!) 107 (!) 108 (!) 104 95  Resp: 20 20 20 20   Temp: 98.7 F (37.1 C) 98.7 F (37.1 C) 98.2 F (36.8 C) 97.7 F (36.5 C)  TempSrc: Oral   Oral  SpO2: 99% 100% 98% 98%  Weight:      Height:        Intake/Output Summary (Last 24 hours) at 06/10/2021 1301 Last data filed at 06/10/2021 1013 Gross per 24 hour  Intake 0 ml  Output 725 ml  Net -725 ml   Filed Weights   06/06/21 0500 06/07/21 0500 06/08/21 0500  Weight: 88.8 kg 88.4 kg 87.9 kg    Examination:  General exam: Appears calm and comfortable, chronically ill in appearance, appears older than stated age Respiratory system: Clear to auscultation. Respiratory effort normal.  On room air Cardiovascular system: S1 & S2 heard, RRR. No JVD, murmurs, rubs, gallops or clicks. No pedal edema.  Port-A-Cath noted to chest Gastrointestinal system: Abdomen is nondistended, soft and nontender. No organomegaly or  masses felt. Normal bowel sounds heard.   Central nervous system: Alert.  Left hemiparesis, minimal movement right extremities independent Extremities: Symmetric 5 x 5 power. Skin: No rashes, lesions or ulcers Psychiatry: Judgement and insight appear poor. Mood & affect appropriate.     Data Reviewed: I have personally reviewed following labs and imaging studies  CBC: Recent Labs  Lab 06/04/21 0430 06/07/21 0615  WBC 14.1* 7.1  HGB 10.7* 10.3*  HCT 32.8* 30.8*  MCV 100.9* 98.7  PLT 325 856   Basic Metabolic Panel: Recent Labs  Lab 06/07/21 0615  NA 137  K 4.6  CL 101  CO2 30  GLUCOSE 121*  BUN 28*  CREATININE 0.80  CALCIUM 7.9*   GFR: Estimated Creatinine Clearance: 74.1 mL/min (by C-G formula based on SCr of 0.8 mg/dL). Liver Function Tests: No results for input(s): AST, ALT, ALKPHOS, BILITOT, PROT, ALBUMIN in the last 168 hours. No results for input(s): LIPASE, AMYLASE in the last 168 hours. No results for input(s): AMMONIA in the last 168 hours. Coagulation Profile: No results for input(s): INR, PROTIME in the last 168 hours. Cardiac Enzymes: No results for input(s): CKTOTAL, CKMB, CKMBINDEX, TROPONINI in the  last 168 hours. BNP (last 3 results) No results for input(s): PROBNP in the last 8760 hours. HbA1C: No results for input(s): HGBA1C in the last 72 hours. CBG: Recent Labs  Lab 06/07/21 1725 06/07/21 2000 06/08/21 0603 06/08/21 0755 06/08/21 1623  GLUCAP 110* 124* 97 97 83   Lipid Profile: No results for input(s): CHOL, HDL, LDLCALC, TRIG, CHOLHDL, LDLDIRECT in the last 72 hours. Thyroid Function Tests: No results for input(s): TSH, T4TOTAL, FREET4, T3FREE, THYROIDAB in the last 72 hours. Anemia Panel: No results for input(s): VITAMINB12, FOLATE, FERRITIN, TIBC, IRON, RETICCTPCT in the last 72 hours. Sepsis Labs: No results for input(s): PROCALCITON, LATICACIDVEN in the last 168 hours.  No results found for this or any previous visit (from the  past 240 hour(s)).       Radiology Studies: No results found.      Scheduled Meds:  collagenase   Topical Daily   Gerhardt's butt cream   Topical TID   Continuous Infusions:     LOS: 19 days    Time spent: 32 minutes spent on chart review, discussion with nursing staff, consultants, updating family and interview/physical exam; more than 50% of that time was spent in counseling and/or coordination of care.    Elwyn Klosinski J [place] DO [facility] Available via Epic secure chat 7am-7pm After these hours, please refer to coverage provider listed on [email or web address] 06/10/2021, 1:01 PM

## 2021-06-11 DIAGNOSIS — R0902 Hypoxemia: Secondary | ICD-10-CM

## 2021-06-11 DIAGNOSIS — G9340 Encephalopathy, unspecified: Secondary | ICD-10-CM | POA: Diagnosis not present

## 2021-06-11 DIAGNOSIS — N179 Acute kidney failure, unspecified: Secondary | ICD-10-CM | POA: Diagnosis not present

## 2021-06-11 DIAGNOSIS — R6520 Severe sepsis without septic shock: Secondary | ICD-10-CM | POA: Diagnosis not present

## 2021-06-11 DIAGNOSIS — J9601 Acute respiratory failure with hypoxia: Secondary | ICD-10-CM | POA: Diagnosis not present

## 2021-06-11 DIAGNOSIS — I639 Cerebral infarction, unspecified: Secondary | ICD-10-CM | POA: Diagnosis not present

## 2021-06-11 DIAGNOSIS — A419 Sepsis, unspecified organism: Secondary | ICD-10-CM | POA: Diagnosis not present

## 2021-06-11 NOTE — Progress Notes (Signed)
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  Patient was last seen normal on 05/19/2021 at 4 PM.  On EMS arrival, patient was noted be hypoxic with SPO2 in the 70s with a normal blood sugar.  EMS with difficulty obtaining blood pressure and patient received 300 mL of LR, 40 mg of lidocaine, 25 mg of Phenergan and an IO was placed in the right humeral head.  Family later reported to EDP that they found her sitting on the toilet slumped with her neck up against a basket.  Of note patient arrived infested with bedbugs and roaches.  In the ED, temperature 91.1 F, HR 95, RR 21, BP 169/123, SPO2 95% on right nonrebreather 15 L.  Sodium 137, potassium 4.8, chloride 97, BUN/creatinine 84/2.1, CO2 17, anion gap 23, AST 155, ALT 59, CK 6755.  WBC 30.3, hemoglobin 15.9, high sensory troponin 22, lactic acid 2.4.  COVID-19 PCR negative.  Influenza A/B PCR negative.  VBG with pH 7.21, PO2 53, PCO2 32.  Chest x-ray with mild left basilar atelectasis without evidence of acute infiltrate.  CT head without contrast interval development of mild hydrocephalus and periventricular edema associated with malpositioned left VA shunt that terminates in the anterior mediastinum and no longer within the left brachiocephalic vein, shunt no to be coiled within the neck, GGO right lower lobe and patchy airspace opacity within the left lower lobe.  CT C-spine with no acute fracture or traumatic injury noted.  Neurosurgery was consulted and patient was taken emergently to the OR by Dr. Izora Ribas for removal of her VA shunt and placement of  external ventricular drain.  Patient was initially admitted to the PCCM service and transferred to hospitalist service on 05/30/2021.   Assessment & Plan:   Principal Problem:   Sepsis ([place]) Active Problems:   Endotracheally intubated   On mechanically assisted ventilation (HCC)   Shunt malfunction   Rhabdomyolysis   AKI (acute kidney injury) ([place])   Increased anion gap metabolic acidosis   Acute encephalopathy   Transaminitis   Pressure injury of skin   Acute embolic stroke ([place])   Palliative care by specialist   DNR (do not resuscitate)   Severe sepsis, POA Proteus/staph epidermis septicemia Patient was seen by infectious disease, etiology suspected from her chronic wounds.  No evidence of VP shunt infection with negative CSF cultures.  TTE unrevealing.  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 Wandalee Ferdinand low risk for endocarditis.  Completed 10-day course of ceftriaxone and 14-day course of linezolid.  Infectious disease now signed off.  Now on comfort measures awaiting placement.  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  Patient was taken emergently to the OR by neurosurgery, Dr. Izora Ribas on 10/11 with complete removal of ventricular atrial shunt and placement of left parietal approach ventriculostomy.  External ventriculostomy drain removed by neurosurgery on 05/30/2021.  CSF cultures negative.  No further intervention planned by neurosurgery  team at this time.  NG tube discontinued, declined PEG tube placement and tube feeds now discontinued.  On comfort measures pending residential hospice placement.  Acute hypoxic respiratory failure, postoperative: Resolved Following shunt removal and EVD placement, patient remained on  mechanical ventilation.  Patient now extubated and weaned to room air.  Multifocal strokes Pain with multifocal infarcts noted, concerning for embolic in nature.  Neurology was consulted.  TTE unrevealing.  Unable to undergo TEE per cardiology due to anatomic abnormalities of epiglottis.  Palliative care was consulted and followed during hospital course, patient has now declined PEG tube and transition to comfort measures.  Discontinued aspirin.  Essential hypertension Discontinued antihypertensives as patient transitioned to comfort measures.  Low normal B12 Vitamin B12 level, 214.  Homocysteine level 17.0, within normal limits.  Patient received IM vitamin B 12 supplementation x3 during hospitalization.  Now transition to comfort measures as above.  Rhabdomyolysis Etiology likely secondary to prolonged downtime.  Received IV fluid hydration.  Improved.  Dysphagia Patient and daughter now declined PEG tube placement and transition to comfort care.  NG tube was removed and tube feedings discontinued.  Continue comfort feeds as tolerates.  GERD: PPI discontinued now on comfort measures  Abnormal uterine bleeding Vaginal ultrasound with endometrial polyp, vascular stalk.  GI was consulted and was planning endometrial biopsy but now discontinued as transition to comfort measures.  Posterior neck, buttock, coccyx pressure injuries, POA Etiology likely secondary to being found down for unknown amount of time at home. Pressure Injury 05/22/21 Buttocks Right Deep Tissue Pressure Injury - Purple or maroon localized area of discolored intact skin or blood-filled blister due to damage of underlying soft tissue from pressure and/or shear. (Active)  05/22/21 0300  Location: Buttocks  Location Orientation: Right  Staging: Deep Tissue Pressure Injury - Purple or maroon localized area of discolored intact skin or blood-filled blister due to damage of underlying soft tissue from pressure and/or shear.   Wound Description (Comments):   Present on Admission: Yes     Pressure Injury 05/22/21 Coccyx Stage 2 -  Partial thickness loss of dermis presenting as a shallow open injury with a red, pink wound bed without slough. (Active)  05/22/21 0300  Location: Coccyx  Location Orientation:   Staging: Stage 2 -  Partial thickness loss of dermis presenting as a shallow open injury with a red, pink wound bed without slough.  Wound Description (Comments):   Present on Admission: Yes     Pressure Injury 05/22/21 Cervical Deep Tissue Pressure Injury - Purple or maroon localized area of discolored intact skin or blood-filled blister due to damage of underlying soft tissue from pressure and/or shear. (Active)  05/22/21 0300  Location: Cervical  Location Orientation:   Staging: Deep Tissue Pressure Injury - Purple or maroon localized area of discolored intact skin or blood-filled blister due to damage of underlying soft tissue from pressure and/or shear.  Wound Description (Comments):   Present on Admission: Yes     Pressure Injury 06/05/21 Buttocks Left Unstageable - Full thickness tissue loss in which the base of the injury is covered by slough (yellow, tan, gray, green or brown) and/or eschar (tan, brown or black) in the wound bed. Long curved wound on whole left (Active)  06/05/21 0622  Location: Buttocks  Location Orientation: Left  Staging: Unstageable - Full thickness tissue loss in which the base of the injury is covered by slough (yellow, tan, gray, green or brown) and/or eschar (tan, brown or black) in the wound  bed.  Wound Description (Comments): Long curved wound on whole left buttocks. black yellow red pink and bloody  Present on Admission:   --continue local wound care, offloading, Foley catheter placed to ensure proper wound healing and decrease wetness and for comfort    DVT prophylaxis:   Comfort measures   Code Status: DNR Family Communication: No family present at bedside this  morning.  Disposition Plan:  Level of care: Med-Surg Status is: Inpatient  Remains inpatient appropriate because: Pending placement    Consultants:  Neurosurgery Neurology Cardiology Infectious disease PCCM Palliative care Gastroenterology OB/GYN  Procedures:  TTE VA shunt removal with placement left parietal approach ventriculostomy 10/11, neurosurgery, Dr. Izora Ribas Extubated 10/11 EVD removal 10/19 Foley catheter placement 10/26  Antimicrobials:  Ceftriaxone 10/13 -10/20 Linezolid 10/19 - 10/27 Vancomycin 10/10 - 10/12 Cefepime 10/10 - 10/12 Metronidazole 10/10 - 10/10    Subjective: Patient seen examined bedside, resting comfortably.  No family present at bedside.  Patient states "I cannot eat this food".  Slightly tearful.  No other questions or concerns at this time.  When asked if she knew she where she was, stated [facility] but did not know the year and thought President Tawni Pummel is the current president of [place].  Received message from her primary nurse this morning regarding patient's current status.  Nurse stated that patient did not want to be on comfort measures and that her goal was to walk again.  Also nurse stated that she was eating and drinking fine, which has not been the case since her hospitalization in which she was initially requiring tube feeds and medications given per tube.  Patient and daughter had decided on Friday 10/28 to forego PEG tube placement and transition to comfort measures at that time.  Patient's nurse today states "advocating for her patient"; but patient currently does not have capacity for medical decision-making.  Discussed with palliative care provider this morning, he has relayed this information to patient's daughter, [name].  She has decided to continue current plan until talks with other family members.   Objective: Vitals:   06/09/21 1110 06/10/21 0512 06/10/21 1011 06/11/21 0505  BP: (!) 152/90 (!) 162/91 (!)  152/93 (!) 157/79  Pulse: (!) 108 (!) 104 95 (!) 107  Resp: 20 20 20 18   Temp: 70.9 F (37.1 C) 98.2 F (36.8 C) 97.7 F (36.5 C) 98.1 F (36.7 C)  TempSrc:   Oral   SpO2: 100% 98% 98% 100%  Weight:      Height:        Intake/Output Summary (Last 24 hours) at 06/11/2021 1147 Last data filed at 06/11/2021 1013 Gross per 24 hour  Intake 60 ml  Output 500 ml  Net -440 ml   Filed Weights   06/06/21 0500 06/07/21 0500 06/08/21 0500  Weight: 88.8 kg 88.4 kg 87.9 kg    Examination:  General exam: Appears calm and comfortable, chronically ill in appearance, appears older than stated age Respiratory system: Clear to auscultation. Respiratory effort normal.  On room air Cardiovascular system: S1 & S2 heard, RRR. No JVD, murmurs, rubs, gallops or clicks. No pedal edema.  Port-A-Cath noted to chest Gastrointestinal system: Abdomen is nondistended, soft and nontender. No organomegaly or masses felt. Normal bowel sounds heard.   Central nervous system: Alert. Oriented to place [facility]), but not time or person [facility]) or situation, left hemiparesis, minimal movement right extremities independent Extremities: Symmetric 5 x 5 power. Skin: No rashes, lesions or ulcers Psychiatry: Judgement  and insight appear poor. Mood & affect appropriate.     Data Reviewed: I have personally reviewed following labs and imaging studies  CBC: Recent Labs  Lab 06/07/21 0615  WBC 7.1  HGB 10.3*  HCT 30.8*  MCV 98.7  PLT 045   Basic Metabolic Panel: Recent Labs  Lab 06/07/21 0615  NA 137  K 4.6  CL 101  CO2 30  GLUCOSE 121*  BUN 28*  CREATININE 0.80  CALCIUM 7.9*   GFR: Estimated Creatinine Clearance: 74.1 mL/min (by C-G formula based on SCr of 0.8 mg/dL). Liver Function Tests: No results for input(s): AST, ALT, ALKPHOS, BILITOT, PROT, ALBUMIN in the last 168 hours. No results for input(s): LIPASE, AMYLASE in the last 168 hours. No results for input(s): AMMONIA in  the last 168 hours. Coagulation Profile: No results for input(s): INR, PROTIME in the last 168 hours. Cardiac Enzymes: No results for input(s): CKTOTAL, CKMB, CKMBINDEX, TROPONINI in the last 168 hours. BNP (last 3 results) No results for input(s): PROBNP in the last 8760 hours. HbA1C: No results for input(s): HGBA1C in the last 72 hours. CBG: Recent Labs  Lab 06/07/21 1725 06/07/21 2000 06/08/21 0603 06/08/21 0755 06/08/21 1623  GLUCAP 110* 124* 97 97 83   Lipid Profile: No results for input(s): CHOL, HDL, LDLCALC, TRIG, CHOLHDL, LDLDIRECT in the last 72 hours. Thyroid Function Tests: No results for input(s): TSH, T4TOTAL, FREET4, T3FREE, THYROIDAB in the last 72 hours. Anemia Panel: No results for input(s): VITAMINB12, FOLATE, FERRITIN, TIBC, IRON, RETICCTPCT in the last 72 hours. Sepsis Labs: No results for input(s): PROCALCITON, LATICACIDVEN in the last 168 hours.  No results found for this or any previous visit (from the past 240 hour(s)).       Radiology Studies: No results found.      Scheduled Meds:  collagenase   Topical Daily   Gerhardt's butt cream   Topical TID   Continuous Infusions:     LOS: 20 days    Time spent: 36 minutes spent on chart review, discussion with nursing staff, consultants, updating family and interview/physical exam; more than 50% of that time was spent in counseling and/or coordination of care.    Jazzalynn Rhudy J [place] DO [facility] Available via Epic secure chat 7am-7pm After these hours, please refer to coverage provider listed on [email or web address] 06/11/2021, 11:47 AM

## 2021-06-11 NOTE — Progress Notes (Signed)
[facility] hospital liaison note:  Follow up to referral received on 10/28 for [facility] hospice home. Chart notes reviewed, Probation officer spoke with staff RN Debi and Palliative NP Randall Hiss. Patient is eating now and per conversations with staff there appears to be some confusion regarding goals of care. Patient at this time is eligible for hospice, but not the hospice home. Liaison to continue to follow.  Flo Shanks BSN, RN, [place] [phone]

## 2021-06-11 NOTE — Care Management Important Message (Signed)
Important Message  Patient Details  Name: Desiree Mitchell MRN: [id number] Date of Birth: [birth year]   Medicare Important Message Given:  Other (see comment)  Patient is on [place] and awaiting a Hospice Home bed. Out of respect for the patient and family no Important Message from [facility] given.   Juliann Pulse A [name] 06/11/2021, 8:31 AM

## 2021-06-11 NOTE — Evaluation (Signed)
Occupational Therapy Evaluation Patient Details Name: Desiree Mitchell MRN: [id number] DOB: 05/13/1956 Today's Date: 06/11/2021   History of Present Illness Pt is a 65 y.o. female presenting to hospital 05/21/21 with AMS (found unresponsive by family).  Significant pressure sore present back of neck, buttocks, and coccyx.  Pt noted to be infested with bed bugs and roaches.  Pt admitted with acute respiratory failure, rhabdomyolysis, metabolic acidosis, acute kidney injury, sepsis, VA shunt malfunction, transaminitis, and posterior neck/buttocks/coccyx pressure injuries.  S/p removal ventriculoatrial shunt and placement of L parietal approach ventriculostomy 05/22/21; extubated 10/11; MRI brain 10/14 showing few scattered small acute infarcts involving different vascular territories (watershed type infarcts); TEE 10/18; and EVD removed 10/19.    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   Clinical Impression   Pt seen for OT re-evaluation and co-tx with PT. Pt in bed, agreeable, requiring increased time for processing and responding to simple questions.  Pt required MAX A +2 for bed mobility and 2 lateral scoots EOB. Pt tolerated sitting EOB with UE vs BUE support and SBA to CGA to MIN A + VC for maintaining static sitting balance, with tendency to lean posteriorly and lean to the left. Pt set up with washcloth to wash her face which she did with UE support on the EOB and CGA. Pt instructed in dynamic reaching activities and maintaining better cervical positioning/posture, as she has tendency to fall into severe cervical flexion. RN notified of pt's posterior cervical incision looking a bit red/seepy and came to assess at end of session. Pt goals reviewed and updated to reflect progress to date. Pt continues to demonstrate significant functional deficits in mobility and ADL due to noted impairments. Continue to recommend STR/SNF upon  discharge.    Recommendations for follow up therapy are one component of a multi-disciplinary discharge planning process, led by the attending physician.  Recommendations may be updated based on patient status, additional functional criteria and insurance authorization.   Follow Up Recommendations  Skilled nursing-short term rehab (<3 hours/day)    Assistance Recommended at Discharge Frequent or constant Supervision/Assistance  Functional Status Assessment  Patient has had a recent decline in their functional status and demonstrates the ability to make significant improvements in function in a reasonable and predictable amount of time.  Equipment Recommendations  BSC    Recommendations for Other Services       Precautions / Restrictions Precautions Precautions: Fall Precaution Comments: Aspiration; gastric tube; rectal tube; R chest port; posterior neck, buttocks, and coccyx pressure injuries Restrictions Weight Bearing Restrictions: No      Mobility Bed Mobility Overal bed mobility: Needs Assistance Bed Mobility: Supine to Sit;Sit to Supine     Supine to sit: Max assist;+2 for physical assistance Sit to supine: Max assist;+2 for physical assistance   General bed mobility comments: Requires assist for trunk and upright posture.    Transfers Overall transfer level: Needs assistance   Transfers: Lateral/Scoot Transfers            Lateral/Scoot Transfers: Max assist;+2 physical assistance;From elevated surface General transfer comment: pt with difficulty sitting EOB with upright posture.  Pt able to perform ~15-20 minutes of sitting upright EOB.      Balance Overall balance assessment: Needs assistance Sitting-balance support: Feet supported;Bilateral upper extremity supported Sitting balance-Leahy Scale: Poor Sitting balance - Comments: Intermittently requires min-modA d/t posterior/forward lean when fatigued. CGA at times with pt's R UE holding onto EOB;  verbal and  tactile cues applied for technique Postural control: Posterior lean Standing balance support: Bilateral upper extremity supported;During functional activity;Reliant on assistive device for balance Standing balance-Leahy Scale: Zero Standing balance comment: not assessed due to weakness from sitting upright.                           ADL either performed or assessed with clinical judgement   ADL Overall ADL's : Needs assistance/impaired     Grooming: Sitting;Wash/dry face;Set up;Min guard Grooming Details (indicate cue type and reason): Pt able to sit EOB with PRN CGA-Min A for balance to wash face with UE support on the bed                               General ADL Comments: pt continues to require MIN/MOD A for seated UB ADLs (bed level, high fowler's) and requires MAX/TOTAL A for LB ADLs bed level. MAX +2 for all bed mobility and lateral scoots this date. Anticipate MAX-TOTAL A x2 for ADL transfer attempts     Vision         Perception     Praxis      Pertinent Vitals/Pain Pain Assessment: No/denies pain     Hand Dominance     Extremity/Trunk Assessment Upper Extremity Assessment Upper Extremity Assessment: Generalized weakness (LUE particularly L hand edematous)   Lower Extremity Assessment Lower Extremity Assessment: Generalized weakness   Cervical / Trunk Assessment Cervical / Trunk Assessment: Other exceptions;Kyphotic Cervical / Trunk Exceptions: forward head/shoulders; difficulty holding head up in sitting requiring frequencing cueing; incision/wound noted to have been seepy with small amoung of clearish fluid build up on pillow case. Case changed and RN notified, in at end of session to assess and address.   Communication Communication Communication: Expressive difficulties (voice is hoarse, also inconsistent attn to task)   Cognition Arousal/Alertness: Awake/alert Behavior During Therapy: Flat affect Overall Cognitive Status: No  family/caregiver present to determine baseline cognitive functioning                                 General Comments: continues to present with severe deficits in alertness and responds to questions periodically, increased processing time     General Comments       Exercises Other Exercises: Pt instructed in dynamic reaching activities and maintaining better cervical positioning/posture, as she has tendency to fall into severe cervical flexion   Shoulder Instructions      Home Living Family/patient expects to be discharged to:: Private residence Living Arrangements: Alone Available Help at Discharge: Family;Available PRN/intermittently (reports daughter [name] lives right next door) Type of Home: House       Home Layout: Two level                   Additional Comments: Pt able to provide minimal home set- up information partially d/t confusion/cognition, partially d/t hoarsness of voice      Prior Functioning/Environment                          OT Problem List: Decreased strength;Decreased range of motion;Decreased activity tolerance;Impaired balance (sitting and/or standing);Decreased cognition;Decreased safety awareness;Decreased knowledge of use of DME or AE;Cardiopulmonary status limiting activity;Impaired tone;Obesity;Impaired UE functional use;Increased edema      OT Treatment/Interventions: Self-care/ADL training;Therapeutic exercise;Neuromuscular  education;DME and/or AE instruction;Manual therapy;Therapeutic activities;Cognitive remediation/compensation;Patient/family education;Balance training    OT Goals(Current goals can be found in the care plan section) Acute Rehab OT Goals Patient Stated Goal: pt did not state OT Goal Formulation: Patient unable to participate in goal setting Time For Goal Achievement: 06/25/21 Potential to Achieve Goals: Fair  OT Frequency: Min 2X/week   Barriers to D/C: Decreased caregiver support           Co-evaluation PT/OT/SLP Co-Evaluation/Treatment: Yes Reason for Co-Treatment: Complexity of the patient's impairments (multi-system involvement);For patient/therapist safety;Necessary to address cognition/behavior during functional activity;To address functional/ADL transfers PT goals addressed during session: Mobility/safety with mobility;Balance;Strengthening/ROM OT goals addressed during session: ADL's and self-care;Strengthening/ROM      AM-PAC OT "6 Clicks" Daily Activity     Outcome Measure Help from another person eating meals?: A Lot Help from another person taking care of personal grooming?: A Little Help from another person toileting, which includes using toliet, bedpan, or urinal?: Total Help from another person bathing (including washing, rinsing, drying)?: A Lot Help from another person to put on and taking off regular upper body clothing?: A Lot Help from another person to put on and taking off regular lower body clothing?: A Lot 6 Click Score: 12   End of Session Nurse Communication: Other (comment) (posterior neck incision)  Activity Tolerance: Patient tolerated treatment well Patient left: in bed;with call bell/phone within reach;with bed alarm set;with nursing/sitter in room  OT Visit Diagnosis: Muscle weakness (generalized) (M62.81);Adult, failure to thrive (R62.7);Other symptoms and signs involving cognitive function                Time: 1610-9604 OT Time Calculation (min): 24 min Charges:  OT General Charges $OT Visit: 1 Visit OT Evaluation $OT Re-eval: 1 Re-eval OT Treatments $Self Care/Home Management : 8-22 mins  Ardeth Perfect., MPH, MS, OTR/L ascom [phone] 06/11/21, 4:44 PM

## 2021-06-11 NOTE — Progress Notes (Addendum)
SLP Cancellation Note  Patient Details Name: Desiree Mitchell MRN: [id number] DOB: 1955/11/12   Cancelled treatment:       Reason Eval/Treat Not Completed: Other (comment) Repeat SLP consult received and appreciated. Chart review completed. Ward discussions ongoing; however, per chart review pt does not want PEG and is consuming a comfort diet pureed diet with thin liquids. Will f/u next date for SLP evaluation, as appropriate.    Clearnce Sorrel Keirsten Matuska 06/11/2021, 2:28 PM

## 2021-06-11 NOTE — Evaluation (Signed)
Physical Therapy Evaluation Patient Details Name: Desiree Mitchell MRN: [id number] DOB: 09-30-1955 Today's Date: 06/11/2021  History of Present Illness  Pt is a 65 y.o. female presenting to hospital 05/21/21 with AMS (found unresponsive by family).  Significant pressure sore present back of neck, buttocks, and coccyx.  Pt noted to be infested with bed bugs and roaches.  Pt admitted with acute respiratory failure, rhabdomyolysis, metabolic acidosis, acute kidney injury, sepsis, VA shunt malfunction, transaminitis, and posterior neck/buttocks/coccyx pressure injuries.  S/p removal ventriculoatrial shunt and placement of L parietal approach ventriculostomy 05/22/21; extubated 10/11; MRI brain 10/14 showing few scattered small acute infarcts involving different vascular territories (watershed type infarcts); TEE 10/18; and EVD removed 10/19.    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    Clinical Impression  PT/OT co treat 2/2 to pt requiring +2 assistance for all mobility/transfers.  Pt required +2 assistance in order to get to EOB where pt was able to perform LE exercises.  Pt also performed static sitting with verbal and tactile cues in order to challenge core musculature with reaches outside base of support.  Pt responded well to this and needed the cuing for upright posture.  Pt tends to hand posterior lean or drooping of the head and thoracic spine forward due to fatigue.  Pt fatigued after ~15-20 minutes of sitting EOB.  Recommendation of SNF following d/c at this time in order to address deficits at this time.    Recommendations for follow up therapy are one component of a multi-disciplinary discharge planning process, led by the attending physician.  Recommendations may be updated based on patient status, additional functional criteria and insurance authorization.  Follow Up Recommendations Skilled nursing-short term rehab (<3  hours/day)    Assistance Recommended at Discharge Frequent or constant Supervision/Assistance  Functional Status Assessment    Equipment Recommendations  Other (comment) (defer to next level of care)    Recommendations for Other Services       Precautions / Restrictions Precautions Precautions: Fall Precaution Comments: Aspiration; gastric tube; rectal tube; R chest port; posterior neck, buttocks, and coccyx pressure injuries Restrictions Weight Bearing Restrictions: No      Mobility  Bed Mobility Overal bed mobility: Needs Assistance Bed Mobility: Supine to Sit;Sit to Supine     Supine to sit: Max assist;+2 for physical assistance Sit to supine: Max assist;+2 for physical assistance   General bed mobility comments: Requires assist for trunk and upright posture.    Transfers                   General transfer comment: pt with difficulty sitting EOB with upright posture.  Pt able to perform ~15-20 minutes of sitting upright EOB.    Ambulation/Gait                Stairs            Wheelchair Mobility    Modified Rankin (Stroke Patients Only)       Balance Overall balance assessment: Needs assistance Sitting-balance support: Feet supported;Bilateral upper extremity supported Sitting balance-Leahy Scale: Poor Sitting balance - Comments: Intermittently requires min-modA d/t posterior/forward lean when fatigued. CGA at times with pt's R UE holding onto EOB; verbal and tactile cues applied for technique Postural control: Posterior lean Standing balance support: Bilateral upper extremity supported;During functional activity;Reliant on assistive device for balance   Standing balance comment: not assessed due to weakness from sitting upright.  Pertinent Vitals/Pain Pain Assessment: No/denies pain    Home Living Family/patient expects to be discharged to:: Private residence Living Arrangements:  Alone Available Help at Discharge: Family;Available PRN/intermittently (reports daughter [name] lives right next door) Type of Home: House         Home Layout: Two level   Additional Comments: Pt able to provide minimal home set- up information partially d/t confusion/cognition, partially d/t hoarsness of voice    Prior Function                       Hand Dominance        Extremity/Trunk Assessment   Upper Extremity Assessment Upper Extremity Assessment: Generalized weakness    Lower Extremity Assessment Lower Extremity Assessment: Generalized weakness       Communication   Communication: Expressive difficulties (voice is hoarse, also inconsistent attn to task)  Cognition Arousal/Alertness: Awake/alert Behavior During Therapy: Flat affect Overall Cognitive Status: No family/caregiver present to determine baseline cognitive functioning                                 General Comments: continues to present with severe deficits in alertness and responds to questions periodically.        General Comments      Exercises Total Joint Exercises Ankle Circles/Pumps: AROM;Strengthening;Both;10 reps;Seated Long Arc Quad: AROM;Strengthening;Both;10 reps;Seated Other Exercises Other Exercises: Pt educated on role of PT and services provided during hospital stay.  Pt also encouraged to continue with therapeutic exercises in order to prevent deconditioning during hospital stay.   Assessment/Plan    PT Assessment Patient needs continued PT services  PT Problem List Decreased strength;Decreased range of motion;Decreased activity tolerance;Decreased balance;Decreased mobility;Decreased cognition;Decreased knowledge of use of DME;Decreased safety awareness;Decreased knowledge of precautions       PT Treatment Interventions      PT Goals (Current goals can be found in the Care Plan section)  Acute Rehab PT Goals Patient Stated Goal: pt did not state any  goals PT Goal Formulation: Patient unable to participate in goal setting Time For Goal Achievement: 06/25/21 Potential to Achieve Goals: Fair    Frequency Min 2X/week   Barriers to discharge Decreased caregiver support      Co-evaluation PT/OT/SLP Co-Evaluation/Treatment: Yes Reason for Co-Treatment: Complexity of the patient's impairments (multi-system involvement);Necessary to address cognition/behavior during functional activity;For patient/therapist safety;To address functional/ADL transfers PT goals addressed during session: Mobility/safety with mobility;Balance;Strengthening/ROM OT goals addressed during session: ADL's and self-care;Strengthening/ROM       AM-PAC PT "6 Clicks" Mobility  Outcome Measure Help needed turning from your back to your side while in a flat bed without using bedrails?: Total Help needed moving from lying on your back to sitting on the side of a flat bed without using bedrails?: Total Help needed moving to and from a bed to a chair (including a wheelchair)?: Total Help needed standing up from a chair using your arms (e.g., wheelchair or bedside chair)?: Total Help needed to walk in hospital room?: Total Help needed climbing 3-5 steps with a railing? : Total 6 Click Score: 6    End of Session Equipment Utilized During Treatment: Oxygen;Other (comment) (feeding tube/ foley/ rectal tube) Activity Tolerance: Patient limited by fatigue Patient left: in bed;with call bell/phone within reach;with bed alarm set;Other (comment) Nurse Communication: Mobility status;Precautions PT Visit Diagnosis: Other abnormalities of gait and mobility (R26.89);Muscle weakness (generalized) (M62.81);Difficulty in walking, not elsewhere classified (  R26.2)    Time: 0301-3143 PT Time Calculation (min) (ACUTE ONLY): 30 min   Charges:   PT Evaluation $PT Re-evaluation: 1 Re-eval PT Treatments $Therapeutic Activity: 8-22 mins        Gwenlyn Saran, PT, DPT 06/11/21,  4:11 PM   Christie Nottingham 06/11/2021, 4:04 PM

## 2021-06-11 NOTE — Progress Notes (Signed)
Daily Progress Note   Patient Name: Desiree Mitchell       Date: 06/11/2021 DOB: 02-Sep-1955  Age: 65 y.o. MRN#: [id number] Attending Physician: [place] Brookie Wayment J, DO Primary Care Physician: Pcp, No Admit Date: 05/21/2021 Length of Stay: 20 days  Reason for Consultation/Follow-up: Establishing goals of care  HPI/Patient Profile:  65 y.o. 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     Patient underwent a removal of shunt and placement of EVD.  Patient was successfully extubated on 10/11.  Neurology is consulted for multiple infarcts.  Gynecology has been consulted for abnormal uterine bleeding.  Gastroenterology has been consulted for potential placement of PEG tube.  She has multiple pressure injuries and wound ostomy team's been consulted.     PMT was consulted for [place] discussion.  Subjective:   Subjective: Chart Reviewed. Updates received. Patient Assessed. Created space and opportunity for patient  and family to explore thoughts and feelings regarding current medical situation.  Today's Discussion: I met with the patient at the bedside.  She knows her first name only and that she is at [facility].  She does not know her last name, the date, who is president.  Question understanding of ramifications of decisions.  Question capacity to make informed decisions about her care.  She states she is not having any pain, dyspnea.  When asked if she ate this morning she stated "I do not like the juice."  Her plate was nearly 419% eaten.  Her diet has been based on comfort care/comfort feeds.  I called and spoke with the patient's daughter Desiree Mitchell.  She requested further clarification on hospice.  I provided an extensive discussion and information about hospice in various settings.  I  indicated because the patient was not eating, did not want PEG tube, and after discussions on Friday, the decision was made to transition to comfort care and residential hospice.  The patient's daughter states she did not understand what this meant.  She clarified that she would like the patient to rehab first to see if she can get better.  She did confirm that she does not want a PEG tube.  I discussed that this would require reevaluation by speech therapy, physical therapy, Occupational Therapy for aspiration risk and ability to rehab.  I discussed that if she is unable to be placed for skilled nursing or if she meets is her maximum potential, then she will need to be discharged from the skilled nursing facility to home.  The patient's daughter informed me that her mom does have Medicaid (into long-term care at a facility may be possible).  I again discussed the patient's multiple chronic comorbidities and acute care issues and made it clear that overall she does have a poor prognosis.  She verbalized understanding.  She states that now that she has had more thoughtful discussion over the weekend she would like to try rehab.  Attempted to clarify if DNR is still desired.  I explained with her multiple acute and chronic health issues that resuscitation is not likely to produce a good outcome or even survivability but could potentially cause discomfort.  She stated that she would like to speak with her uncle and her brother about this and will call back.  We agreed to leave her as a DNR until further instructions are provided  by patient's family.  ROS Limited due to mental status Review of Systems  Constitutional:        Denies pain in general  Respiratory:  Negative for shortness of breath.   Gastrointestinal:  Negative for abdominal pain, nausea and vomiting.   Objective:   Vital Signs:  BP (!) 157/79 (BP Location: Left Leg)   Pulse (!) 107   Temp 98.1 F (36.7 C)   Resp 18   Ht 5' 2"  (1.575 m)    Wt 87.9 kg   SpO2 100%   BMI 35.44 kg/m   Physical Exam: Physical Exam Vitals and nursing note reviewed.  Constitutional:      General: She is not in acute distress.    Appearance: She is ill-appearing.  HENT:     Head: Normocephalic and atraumatic.  Cardiovascular:     Rate and Rhythm: Tachycardia present.  Pulmonary:     Effort: No respiratory distress.  Abdominal:     General: Abdomen is flat.     Palpations: Abdomen is soft.  Skin:    General: Skin is warm and dry.  Neurological:     Mental Status: She is alert. She is disoriented.    Palliative Assessment/Data: 30%   Assessment & Plan:   Impression: Present on Admission:  Sepsis [facility])  65 year old female with multiple medical comorbidities admitted with sepsis (likely from multiple chronic wounds), VA shunt malfunction s/p removal, new multifocal emboic strokes, rhabdomyolysis (now improved), abnormal uterine bleeding (previously discussed endometrial bx which was previously declined by patient and family). Previous palliative discussions with patient and family decided on no PEG tube, no endometrial biopsy, and transition to comfort care.  Previous discussions with patient's daughter Desiree Mitchell on 10/28 in agreement for hospice and comfort care; hospice with "at home" at [place] facility or inpatient hospice facility. Her daughter stated the patient cannot return home as family only able to check on her during the weekends.  Staff nurse for 10/31 verbalized that patient now stating she wants to get better and walk again. Questionable capacity (did not know her full name for me 10/31, knew she was at [facility], does not know year or president). Unsure if she can comprehend situation and ramifications of medical decisions.  SUMMARY OF RECOMMENDATIONS   SLP evaluation per Dr. [place] Await decisions/call back from daughter about code status Patient will likely need PT/OT re-evaluation for any rehab potential Continue to treat the  treatable PMT will continue to follow  Code Status: DNR  Prognosis: < 6 months  Discharge Planning: To Be Determined  Discussed with: Medical staff, nursing staff, patient's daughter  Thank you for allowing [place] to participate in the care of Desiree Mitchell PMT will continue to support holistically.  Time Total: 75 min  Visit consisted of counseling and education dealing with the complex and emotionally intense issues of symptom management and palliative care in the setting of serious and potentially life-threatening illness. Greater than 50%  of this time was spent counseling and coordinating care related to the above assessment and plan.  Walden Field, NP Palliative Medicine Team  Team Phone # [phone] (Nights/Weekends)  04/10/2021, 8:17 AM

## 2021-06-12 DIAGNOSIS — A419 Sepsis, unspecified organism: Secondary | ICD-10-CM | POA: Diagnosis not present

## 2021-06-12 DIAGNOSIS — N179 Acute kidney failure, unspecified: Secondary | ICD-10-CM | POA: Diagnosis not present

## 2021-06-12 DIAGNOSIS — Z66 Do not resuscitate: Secondary | ICD-10-CM | POA: Diagnosis not present

## 2021-06-12 DIAGNOSIS — J9601 Acute respiratory failure with hypoxia: Secondary | ICD-10-CM | POA: Diagnosis not present

## 2021-06-12 MED ORDER — METOPROLOL TARTRATE 25 MG PO TABS
25.0000 mg | ORAL_TABLET | Freq: Two times a day (BID) | ORAL | Status: DC
  Administered 2021-06-12 – 2021-07-11 (×59): 25 mg via ORAL
  Filled 2021-06-12 (×59): qty 1

## 2021-06-12 NOTE — Progress Notes (Signed)
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  Patient was last seen normal on 05/19/2021 at 4 PM.  On EMS arrival, patient was noted be hypoxic with SPO2 in the 70s with a normal blood sugar.  EMS with difficulty obtaining blood pressure and patient received 300 mL of LR, 40 mg of lidocaine, 25 mg of Phenergan and an IO was placed in the right humeral head.  Family later reported to EDP that they found her sitting on the toilet slumped with her neck up against a basket.  Of note patient arrived infested with bedbugs and roaches.  In the ED, temperature 91.1 F, HR 95, RR 21, BP 169/123, SPO2 95% on right nonrebreather 15 L.  Sodium 137, potassium 4.8, chloride 97, BUN/creatinine 84/2.1, CO2 17, anion gap 23, AST 155, ALT 59, CK 6755.  WBC 30.3, hemoglobin 15.9, high sensory troponin 22, lactic acid 2.4.  COVID-19 PCR negative.  Influenza A/B PCR negative.  VBG with pH 7.21, PO2 53, PCO2 32.  Chest x-ray with mild left basilar atelectasis without evidence of acute infiltrate.  CT head without contrast interval development of mild hydrocephalus and periventricular edema associated with malpositioned left VA shunt that terminates in the anterior mediastinum and no longer within the left brachiocephalic vein, shunt no to be coiled within the neck, GGO right lower lobe and patchy airspace opacity within the left lower lobe.  CT C-spine with no acute fracture or traumatic injury noted.  Neurosurgery was consulted and patient was taken emergently to the OR by Dr. Izora Ribas for removal of her VA shunt and placement of  external ventricular drain.  Patient was initially admitted to the PCCM service and transferred to hospitalist service on 05/30/2021.   Assessment & Plan:   Principal Problem:   Sepsis ([place]) Active Problems:   Endotracheally intubated   On mechanically assisted ventilation (HCC)   Shunt malfunction   Rhabdomyolysis   AKI (acute kidney injury) ([place])   Increased anion gap metabolic acidosis   Acute encephalopathy   Transaminitis   Pressure injury of skin   Acute embolic stroke ([place])   Palliative care by specialist   DNR (do not resuscitate)   Severe sepsis, POA Proteus/staph epidermis septicemia Patient was seen by infectious disease, etiology suspected from her chronic wounds.  No evidence of VP shunt infection with negative CSF cultures.  TTE unrevealing.  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 Wandalee Ferdinand low risk for endocarditis.  Completed 10-day course of ceftriaxone and 14-day course of linezolid.  Infectious disease now signed off.   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  Patient was taken emergently to the OR by neurosurgery, Dr. Izora Ribas on 10/11 with complete removal of ventricular atrial shunt and placement of left parietal approach ventriculostomy.  External ventriculostomy drain removed by neurosurgery on 05/30/2021.  CSF cultures negative.  No further intervention planned by neurosurgery team at this time.  Acute hypoxic respiratory failure, postoperative: Resolved Following shunt removal and EVD placement, patient remained on mechanical ventilation.  Patient now extubated and weaned to room air.  Multifocal strokes Pain with multifocal infarcts noted, concerning for embolic in nature.  Neurology was  consulted.  TTE unrevealing.  Unable to undergo TEE per cardiology due to anatomic abnormalities of epiglottis.  Palliative care was consulted and followed during hospital course, patient has declined PEG tube.  Essential hypertension -- Metoprolol tartrate 25 mg p.o. twice daily  Low normal B12 Vitamin B12 level, 214.  Homocysteine level 17.0, within normal limits.  Patient received IM vitamin B 12 supplementation x3 during hospitalization.    Rhabdomyolysis Etiology likely secondary to prolonged downtime.  Received IV fluid hydration.  Improved.  Dysphagia Patient and daughter now declined PEG tube placement and planned initially to transition to comfort care.  NG tube was removed and tube feedings discontinued.  Now patient's and daughter wish to attempt rehab.  Continue comfort feeds as tolerates.  Speech therapy following.  GERD: PPI discontinued now on comfort measures  Abnormal uterine bleeding Vaginal ultrasound with endometrial polyp, vascular stalk.  GI was consulted and was planning endometrial biopsy but was discontinued as transitioned to comfort measures; but now family and patient interested in rehab.  Outpatient follow-up with GYN. --Repeat CBC in the a.m.  Posterior neck, buttock, coccyx pressure injuries, POA Etiology likely secondary to being found down for unknown amount of time at home. Pressure Injury 05/22/21 Buttocks Right Deep Tissue Pressure Injury - Purple or maroon localized area of discolored intact skin or blood-filled blister due to damage of underlying soft tissue from pressure and/or shear. (Active)  05/22/21 0300  Location: Buttocks  Location Orientation: Right  Staging: Deep Tissue Pressure Injury - Purple or maroon localized area of discolored intact skin or blood-filled blister due to damage of underlying soft tissue from pressure and/or shear.  Wound Description (Comments):   Present on Admission: Yes     Pressure Injury 05/22/21 Coccyx Stage 2 -   Partial thickness loss of dermis presenting as a shallow open injury with a red, pink wound bed without slough. (Active)  05/22/21 0300  Location: Coccyx  Location Orientation:   Staging: Stage 2 -  Partial thickness loss of dermis presenting as a shallow open injury with a red, pink wound bed without slough.  Wound Description (Comments):   Present on Admission: Yes     Pressure Injury 05/22/21 Cervical Deep Tissue Pressure Injury - Purple or maroon localized area of discolored intact skin or blood-filled blister due to damage of underlying soft tissue from pressure and/or shear. (Active)  05/22/21 0300  Location: Cervical  Location Orientation:   Staging: Deep Tissue Pressure Injury - Purple or maroon localized area of discolored intact skin or blood-filled blister due to damage of underlying soft tissue from pressure and/or shear.  Wound Description (Comments):   Present on Admission: Yes     Pressure Injury 06/05/21 Buttocks Left Unstageable - Full thickness tissue loss in which the base of the injury is covered by slough (yellow, tan, gray, green or brown) and/or eschar (tan, brown or black) in the wound bed. Long curved wound on whole left (Active)  06/05/21 0622  Location: Buttocks  Location Orientation: Left  Staging: Unstageable - Full thickness tissue loss in which the base of the injury is covered by slough (yellow, tan, gray, green or brown) and/or eschar (tan, brown or black) in the wound bed.  Wound Description (Comments): Long curved  wound on whole left buttocks. black yellow red pink and bloody  Present on Admission:   --continue local wound care, offloading, Foley catheter placed to ensure proper wound healing and decrease wetness and for comfort    DVT prophylaxis:   SCDs   Code Status: DNR Family Communication: No family present at bedside this morning.  Disposition Plan:  Level of care: Med-Surg Status is: Inpatient  Remains inpatient appropriate because:  Pending placement    Consultants:  Neurosurgery Neurology Cardiology Infectious disease PCCM Palliative care Gastroenterology OB/GYN  Procedures:  TTE VA shunt removal with placement left parietal approach ventriculostomy 10/11, neurosurgery, Dr. Izora Ribas Extubated 10/11 EVD removal 10/19 Foley catheter placement 10/26  Antimicrobials:  Ceftriaxone 10/13 -10/20 Linezolid 10/19 - 10/27 Vancomycin 10/10 - 10/12 Cefepime 10/10 - 10/12 Metronidazole 10/10 - 10/10    Subjective: Patient seen examined bedside, resting comfortably.  Sleeping but arousable.  No family present at bedside.  Less verbal this morning.  Patient family have decided to discontinue comfort measures and wish for rehab placement.  Continues with very poor prognosis, poor oral intake.  Remains confused and has no capacity for medical decision-making at this time.  Placement per TOC.   Objective: Vitals:   06/11/21 0505 06/11/21 1542 06/12/21 0601 06/12/21 1135  BP: (!) 157/79 (!) 170/97 (!) 165/87 (!) 154/89  Pulse: (!) 107 (!) 114 (!) 112 88  Resp: 18 (!) 24 16 17   Temp: 98.1 F (36.7 C) 98.6 F (37 C) 98.8 F (37.1 C) 98 F (36.7 C)  TempSrc:  Oral Oral Oral  SpO2: 100% 94% 94% 95%  Weight:      Height:        Intake/Output Summary (Last 24 hours) at 06/12/2021 1157 Last data filed at 06/12/2021 1139 Gross per 24 hour  Intake 615 ml  Output 400 ml  Net 215 ml   Filed Weights   06/06/21 0500 06/07/21 0500 06/08/21 0500  Weight: 88.8 kg 88.4 kg 87.9 kg    Examination:  General exam: Appears calm and comfortable, chronically ill in appearance, appears older than stated age Respiratory system: Clear to auscultation. Respiratory effort normal.  On room air Cardiovascular system: S1 & S2 heard, RRR. No JVD, murmurs, rubs, gallops or clicks. No pedal edema.  Port-A-Cath noted to chest Gastrointestinal system: Abdomen is nondistended, soft and nontender. No organomegaly or masses felt.  Normal bowel sounds heard.   Central nervous system: Alert. Oriented to place [employer]), but not time or person [facility] or situation, left hemiparesis, minimal movement right extremities independent Extremities: Symmetric 5 x 5 power. Skin: No rashes, lesions or ulcers Psychiatry: Judgement and insight appear poor. Mood & affect appropriate.     Data Reviewed: I have personally reviewed following labs and imaging studies  CBC: Recent Labs  Lab 06/07/21 0615  WBC 7.1  HGB 10.3*  HCT 30.8*  MCV 98.7  PLT 606   Basic Metabolic Panel: Recent Labs  Lab 06/07/21 0615  NA 137  K 4.6  CL 101  CO2 30  GLUCOSE 121*  BUN 28*  CREATININE 0.80  CALCIUM 7.9*   GFR: Estimated Creatinine Clearance: 74.1 mL/min (by C-G formula based on SCr of 0.8 mg/dL). Liver Function Tests: No results for input(s): AST, ALT, ALKPHOS, BILITOT, PROT, ALBUMIN in the last 168 hours. No results for input(s): LIPASE, AMYLASE in the last 168 hours. No results for input(s): AMMONIA in the last 168 hours. Coagulation Profile: No results for input(s): INR, PROTIME in the  last 168 hours. Cardiac Enzymes: No results for input(s): CKTOTAL, CKMB, CKMBINDEX, TROPONINI in the last 168 hours. BNP (last 3 results) No results for input(s): PROBNP in the last 8760 hours. HbA1C: No results for input(s): HGBA1C in the last 72 hours. CBG: Recent Labs  Lab 06/07/21 1725 06/07/21 2000 06/08/21 0603 06/08/21 0755 06/08/21 1623  GLUCAP 110* 124* 97 97 83   Lipid Profile: No results for input(s): CHOL, HDL, LDLCALC, TRIG, CHOLHDL, LDLDIRECT in the last 72 hours. Thyroid Function Tests: No results for input(s): TSH, T4TOTAL, FREET4, T3FREE, THYROIDAB in the last 72 hours. Anemia Panel: No results for input(s): VITAMINB12, FOLATE, FERRITIN, TIBC, IRON, RETICCTPCT in the last 72 hours. Sepsis Labs: No results for input(s): PROCALCITON, LATICACIDVEN in the last 168 hours.  No results found for  this or any previous visit (from the past 240 hour(s)).       Radiology Studies: No results found.      Scheduled Meds:  collagenase   Topical Daily   Gerhardt's butt cream   Topical TID   metoprolol tartrate  25 mg Oral BID   Continuous Infusions:     LOS: 21 days    Time spent: 36 minutes spent on chart review, discussion with nursing staff, consultants, updating family and interview/physical exam; more than 50% of that time was spent in counseling and/or coordination of care.    Desiree Mitchell J [place] DO [facility] Available via Epic secure chat 7am-7pm After these hours, please refer to coverage provider listed on [email or web address] 06/12/2021, 11:57 AM

## 2021-06-12 NOTE — Evaluation (Signed)
Clinical/Bedside Swallow Evaluation Patient Details  Name: Desiree Mitchell MRN: [id number] Date of Birth: Jul 27, 1956  Today's Date: 06/12/2021 Time: SLP Start Time (ACUTE ONLY): 46 SLP Stop Time (ACUTE ONLY): 1600 SLP Time Calculation (min) (ACUTE ONLY): 50 min  Past Medical History: History reviewed. No pertinent past medical history. Past Surgical History:  Past Surgical History:  Procedure Laterality Date   SHUNT REMOVAL Left 05/21/2021   Procedure: SHUNT REMOVAL;  Surgeon: Meade Maw, MD;  Location: [facility] ORS;  Service: Neurosurgery;  Laterality: Left;   TEE WITHOUT CARDIOVERSION N/A 05/29/2021   Procedure: TRANSESOPHAGEAL ECHOCARDIOGRAM (TEE);  Surgeon: Corey Skains, MD;  Location: [facility] ORS;  Service: Cardiovascular;  Laterality: N/A;   VENTRICULOSTOMY Left 05/21/2021   Procedure: VENTRICULOSTOMY;  Surgeon: Meade Maw, MD;  Location: [facility] ORS;  Service: Neurosurgery;  Laterality: Left;   HPI:  65 year old female presented at [facility] ED on 05/21/2021. PMHx of subarachnoid hemorrhage s/p drain placement (approximately 2007), breast cancer (port a cath in place), obesity (BMI 34.48) presented on 10/10 with altered mental status found unresponsive by family. Upon arrival imaging completed, including, "CXR 05/21/2021: Mild left basilar atelectasis without evidence of an acute infiltrate  CT head without contrast 05/21/2021: Interval development of mild hydrocephalus and periventricular edema, associated malpositioned left VA shunt that terminates in the anterior mediastinum and no longer within the left brachiocephalic vein.  Shunt noted to be coiled within the neck.  Groundglass opacity within the right lower lobe and patchy airspace opacity within the left lower lobe." She has a history of prior subarachnoid hemorrhage requiring shunt placement. Resulting in neurosurgery consult. CT head showed increased ventricular size concerning for shunt malfunction. She underwent removal of  shunt and placement of EVD on 05/21/21.    Assessment / Plan / Recommendation  Clinical Impression  Pt appears to present w/ primary pharyngeal phase dysphagia in light of generalized weakness and lengthy illness. In addition, pt initially presented to the ED with confusion, CT head with interval development of mild this and periventricular edema associated with malposition left VA shunt. Pt was taken emergently to the OR by neurosurgery, Dr. Izora Ribas on 10/11 with complete removal of ventricular atrial shunt and placement of left parietal approach ventriculostomy. Pt has also had multifocal infarcts thought to be ebolic in nature. All of these factors can impact overall awareness/engagement and safety during po tasks which increases risk for aspiration. Pt's risk for aspiration is present but reduced when following aspiration precautions and when using a modified, dysphagia diet including Nectar consistency liquids. She requires min-mod verbal cues for follow through w/ swallowing precautions/strategies. Pt consumed trials of ice chips, purees, Minced solids, and thin/Nectar liquids w/ consistent, overt s/s of aspiration w/ ice chips and thin liquid trials; No consistent, overt clinical s/s of aspiration noted w/ Nectar liquids and food trials -- no decline in vocal quality during verbalizations, no cough, and no decline in respiratory status during/post trials. Oral phase was grossly [facility] for bolus management, timely A-P transfer, and oral clearing of the boluses given. Mastication of Minced, moistened foods was adequate but required Time -- suspect d/t overall weakness. Suspect the overt coughing noted w/ the thin liquids (and also endorsed by pt to be occurring onoing since the "comfort" diet started) is related to delayed timing of pharyngeal swallowing. OM Exam was revealed no unilateral weakness. Speech grossly clear; low volume.     D/t pt's factors and her risk for aspiration, recommend modification  of the "comfort" diet to a dysphagia level 2(Minced  and purees) w/ Nectar liquids via Cup; aspiration precautions; reduce Distractions during meals. Pills Crushed in Puree for safer swallowing. Support setup at meals -- pt can feed self. Recommend ongoing monitoring and f/u w/ Dysphagia therapy targeting pharyngeal swallowing at next venue of care.   Dtr an pt have decided against [place] w/ Hospice at this time as well as No PEG, per report. Recommend Palliative Care f/u for ongoing discussion of [place]; Dietician f/u for support. NSG updated. ST services will continue to follow for ongoing education as needed; MD updated. SLP Visit Diagnosis: Dysphagia, oropharyngeal phase (R13.12)    Aspiration Risk  Moderate aspiration risk;Risk for inadequate nutrition/hydration    Diet Recommendation   dysphagia level 2(Minced and purees) w/ Nectar liquids via Cup; aspiration precautions; reduce Distractions during meals. Support and setup at meals -- pt can feed self. NO STRAWS -- Cup only.   Medication Administration: Crushed with puree    Other  Recommendations Recommended Consults:  (Palliative Care following) Oral Care Recommendations: Oral care BID;Oral care before and after PO;Staff/trained caregiver to provide oral care Other Recommendations: Order thickener from pharmacy;Prohibited food (jello, ice cream, thin soups);Remove water pitcher;Have oral suction available    Recommendations for follow up therapy are one component of a multi-disciplinary discharge planning process, led by the attending physician.  Recommendations may be updated based on patient status, additional functional criteria and insurance authorization.  Follow up Recommendations Skilled Nursing facility;24 hour supervision/assistance      Frequency and Duration min 2x/week  1 week       Prognosis Prognosis for Safe Diet Advancement: Guarded (-Fair) Barriers to Reach Goals: Time post onset;Severity of  deficits Barriers/Prognosis Comment: lengthy illness      Swallow Study   General Date of Onset: 05/22/21 HPI: 65 year old female presented at [facility] ED on 05/21/2021. PMHx of subarachnoid hemorrhage s/p drain placement (approximately 2007), breast cancer (port a cath in place), obesity (BMI 34.48) presented on 10/10 with altered mental status found unresponsive by family. Upon arrival imaging completed, including, "CXR 05/21/2021: Mild left basilar atelectasis without evidence of an acute infiltrate  CT head without contrast 05/21/2021: Interval development of mild hydrocephalus and periventricular edema, associated malpositioned left VA shunt that terminates in the anterior mediastinum and no longer within the left brachiocephalic vein.  Shunt noted to be coiled within the neck.  Groundglass opacity within the right lower lobe and patchy airspace opacity within the left lower lobe." She has a history of prior subarachnoid hemorrhage requiring shunt placement. Resulting in neurosurgery consult. CT head showed increased ventricular size concerning for shunt malfunction. She underwent removal of shunt and placement of EVD on 05/21/21. Type of Study: Bedside Swallow Evaluation Previous Swallow Assessment: during this admit -- see notes Diet Prior to this Study: NPO (recently began a "Pleasure" diet for Comfort per MD -- pt is no longer comfort care per MD) Temperature Spikes Noted: No (wbc 7.1) Respiratory Status: Room air History of Recent Intubation: Yes (for procedure) Length of Intubations (days): 1 days Date extubated: 05/22/21 Behavior/Cognition: Alert;Cooperative;Pleasant mood;Requires cueing (min.  appears weak but able to feed self) Oral Cavity Assessment: Dry Oral Care Completed by SLP: Yes Oral Cavity - Dentition: Adequate natural dentition Vision: Functional for self-feeding Self-Feeding Abilities: Able to feed self;Needs assist;Needs set up Patient Positioning: Upright in bed (needed  min positioning support) Baseline Vocal Quality: Normal;Low vocal intensity Volitional Cough: Strong;Congested Volitional Swallow: Able to elicit    Oral/Motor/Sensory Function Overall Oral Motor/Sensory Function: Generalized oral weakness  Lingual ROM: Within Functional Limits Lingual Symmetry: Within Functional Limits Lingual Strength: Within Functional Limits Mandible: Within Functional Limits   Ice Chips Ice chips: Impaired Presentation: Spoon (fed; 3 trials) Oral Phase Impairments:  (adequate) Pharyngeal Phase Impairments: Throat Clearing - Delayed (x1/3)   Thin Liquid Thin Liquid: Impaired Presentation: Cup;Straw;Self Fed (3 trials) Oral Phase Impairments:  (adequate) Pharyngeal  Phase Impairments: Cough - Delayed;Cough - Immediate (x3/3 trials)    Nectar Thick Nectar Thick Liquid: Impaired Presentation: Cup;Self Fed (8 trials) Oral Phase Impairments:  (adequate) Pharyngeal Phase Impairments: Cough - Delayed (x2/8 trials) Other Comments: utilized a chin tuck position which appeared to aid during pharyngeal swallowing   Honey Thick Honey Thick Liquid: Not tested   Puree Puree: Within functional limits Presentation: Spoon;Self Fed (4o zs)   Solid     Solid: Impaired (min weak) Presentation: Self Fed (4 trials) Oral Phase Impairments: Impaired mastication (min) Oral Phase Functional Implications: Prolonged oral transit;Impaired mastication (min weakness) Pharyngeal Phase Impairments:  (none)       Orinda Kenner, MS, [employer] Speech Language Pathologist Rehab Services [phone] [facility] 06/12/2021,5:23 PM

## 2021-06-13 DIAGNOSIS — I639 Cerebral infarction, unspecified: Secondary | ICD-10-CM | POA: Diagnosis not present

## 2021-06-13 DIAGNOSIS — L8990 Pressure ulcer of unspecified site, unspecified stage: Secondary | ICD-10-CM | POA: Diagnosis not present

## 2021-06-13 DIAGNOSIS — A419 Sepsis, unspecified organism: Secondary | ICD-10-CM | POA: Diagnosis not present

## 2021-06-13 DIAGNOSIS — G9340 Encephalopathy, unspecified: Secondary | ICD-10-CM | POA: Diagnosis not present

## 2021-06-13 LAB — TYPE AND SCREEN
ABO/RH(D): AB POS
Antibody Screen: NEGATIVE
Unit division: 0

## 2021-06-13 LAB — COMPREHENSIVE METABOLIC PANEL
ALT: 13 U/L (ref 0–44)
AST: 19 U/L (ref 15–41)
Albumin: 2.5 g/dL — ABNORMAL LOW (ref 3.5–5.0)
Alkaline Phosphatase: 63 U/L (ref 38–126)
Anion gap: 6 (ref 5–15)
BUN: 25 mg/dL — ABNORMAL HIGH (ref 8–23)
CO2: 29 mmol/L (ref 22–32)
Calcium: 8.1 mg/dL — ABNORMAL LOW (ref 8.9–10.3)
Chloride: 107 mmol/L (ref 98–111)
Creatinine, Ser: 0.93 mg/dL (ref 0.44–1.00)
GFR, Estimated: >60 mL/min (ref >60)
Glucose, Bld: 124 mg/dL — ABNORMAL HIGH (ref 70–99)
Potassium: 4.1 mmol/L (ref 3.5–5.1)
Sodium: 142 mmol/L (ref 135–145)
Total Bilirubin: 0.6 mg/dL (ref 0.3–1.2)
Total Protein: 6.3 g/dL — ABNORMAL LOW (ref 6.5–8.1)

## 2021-06-13 LAB — MAGNESIUM: Magnesium: 2.1 mg/dL (ref 1.7–2.4)

## 2021-06-13 LAB — CBC
HCT: 21.2 % — ABNORMAL LOW (ref 36.0–46.0)
Hemoglobin: 6.7 g/dL — ABNORMAL LOW (ref 12.0–15.0)
MCH: 31.9 pg (ref 26.0–34.0)
MCHC: 31.6 g/dL (ref 30.0–36.0)
MCV: 101 fL — ABNORMAL HIGH (ref 80.0–100.0)
Platelets: 340 10*3/uL (ref 150–400)
RBC: 2.1 MIL/uL — ABNORMAL LOW (ref 3.87–5.11)
RDW: 14.6 % (ref 11.5–15.5)
WBC: 9.9 10*3/uL (ref 4.0–10.5)
nRBC: 0 % (ref 0.0–0.2)

## 2021-06-13 LAB — HEMOGLOBIN AND HEMATOCRIT, BLOOD
HCT: 33.3 % — ABNORMAL LOW (ref 36.0–46.0)
Hemoglobin: 10.6 g/dL — ABNORMAL LOW (ref 12.0–15.0)

## 2021-06-13 LAB — BPAM RBC
Blood Product Expiration Date: 202211292359
Unit Type and Rh: 6200

## 2021-06-13 LAB — ABO/RH: ABO/RH(D): AB POS

## 2021-06-13 LAB — PREPARE RBC (CROSSMATCH)

## 2021-06-13 MED ORDER — SODIUM CHLORIDE 0.9% IV SOLUTION: Freq: Once | INTRAVENOUS | Status: DC

## 2021-06-13 MED ORDER — PANTOPRAZOLE SODIUM 40 MG PO TBEC
40.0000 mg | DELAYED_RELEASE_TABLET | Freq: Every day | ORAL | Status: DC
  Administered 2021-06-13 – 2021-07-11 (×29): 40 mg via ORAL
  Filled 2021-06-13 (×29): qty 1

## 2021-06-13 MED ORDER — ADULT MULTIVITAMIN W/MINERALS CH
1.0000 | ORAL_TABLET | Freq: Every day | ORAL | Status: DC
  Administered 2021-06-13 – 2021-07-11 (×29): 1 via ORAL
  Filled 2021-06-13 (×29): qty 1

## 2021-06-13 MED ORDER — VITAMIN B-12 1000 MCG PO TABS
1000.0000 ug | ORAL_TABLET | Freq: Every day | ORAL | Status: DC
  Administered 2021-06-13 – 2021-07-11 (×29): 1000 ug via ORAL
  Filled 2021-06-13 (×30): qty 1

## 2021-06-13 MED ORDER — ASPIRIN EC 81 MG PO TBEC
81.0000 mg | DELAYED_RELEASE_TABLET | Freq: Every day | ORAL | Status: DC
  Administered 2021-06-13 – 2021-07-11 (×29): 81 mg via ORAL
  Filled 2021-06-13 (×29): qty 1

## 2021-06-13 MED ORDER — NEPRO/CARBSTEADY PO LIQD
237.0000 mL | Freq: Three times a day (TID) | ORAL | Status: DC
  Administered 2021-06-13 – 2021-07-11 (×70): 237 mL via ORAL

## 2021-06-13 NOTE — Progress Notes (Signed)
Physical Therapy Treatment Patient Details Name: Desiree Mitchell MRN: [id number] DOB: 10-09-1955 Today's Date: 06/13/2021   History of Present Illness Pt is a 65 y.o. female presenting to hospital 05/21/21 with AMS (found unresponsive by family).  Significant pressure sore present back of neck, buttocks, and coccyx.  Pt noted to be infested with bed bugs and roaches.  Pt admitted with acute respiratory failure, rhabdomyolysis, metabolic acidosis, acute kidney injury, sepsis, VA shunt malfunction, transaminitis, and posterior neck/buttocks/coccyx pressure injuries.  S/p removal ventriculoatrial shunt and placement of L parietal approach ventriculostomy 05/22/21; extubated 10/11; MRI brain 10/14 showing few scattered small acute infarcts involving different vascular territories (watershed type infarcts); TEE 10/18; and EVD removed 10/19.    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    PT Comments    Pt alert in bed, recently completed hygiene care with nursing but agreeable to treatment. PT/OT co-treat this session to address mobility, ADLs and safety. Pt required increased time for processing cues and often times does not respond. However, pt is able to communicate needs without issue (asked to return to bed due to fatigue, etc). Pt required + 2 physical assist for all mobility this session, but was able to progress to standing w/ HHA + 2, MAX-A. Pt was able to clear bottom with significant physical assistance but can not achieve full upright posture. Due to changes from PLOF and current required level of assistance SNF remains current discharge recommendation. Skilled PT intervention is indicated to address deficits in function, mobility, and to return to PLOF as able.     Recommendations for follow up therapy are one component of a multi-disciplinary discharge planning process, led by the attending physician.  Recommendations may be  updated based on patient status, additional functional criteria and insurance authorization.  Follow Up Recommendations  Skilled nursing-short term rehab (<3 hours/day)     Assistance Recommended at Discharge Frequent or constant Supervision/Assistance  Equipment Recommendations  Other (comment) (TBD next venue of care)    Recommendations for Other Services       Precautions / Restrictions Precautions Precautions: Fall Precaution Comments: Aspiration; gastric tube; R chest port; posterior neck, buttocks, and coccyx pressure injuries Restrictions Weight Bearing Restrictions: No     Mobility  Bed Mobility Overal bed mobility: Needs Assistance Bed Mobility: Supine to Sit;Sit to Supine;Rolling Rolling: Min assist   Supine to sit: Max assist;+2 for physical assistance Sit to supine: Max assist;+2 for physical assistance   General bed mobility comments: Requires trunk and BLE support    Transfers Overall transfer level: Needs assistance Equipment used: 2 person hand held assist Transfers: Lateral/Scoot Transfers;Sit to/from Stand Sit to Stand: Max assist;+2 physical assistance          Lateral/Scoot Transfers: Max assist;From elevated surface General transfer comment: STS x 3 Clears bottom but does not initiate knee and hip extension required for full upright standing w/ MAX-A; attempted lateral scooting to [employer] [facility] and pt unable to clear bottom with MAX-A;    Ambulation/Gait             General Gait Details: unable/unsafe to trial at this time   Stairs             Wheelchair Mobility    Modified Rankin (Stroke Patients Only)       Balance Overall balance assessment: Needs assistance Sitting-balance support: Feet supported;Bilateral upper extremity supported Sitting balance-Leahy Scale: Poor Sitting balance - Comments: requires BUE  support, does not require physical assist if BUE on surface but cannot tolerate challenge   Standing balance support:  Bilateral upper extremity supported Standing balance-Leahy Scale: Poor                              Cognition Arousal/Alertness: Awake/alert Behavior During Therapy: Flat affect Overall Cognitive Status: No family/caregiver present to determine baseline cognitive functioning                                 General Comments: Oriented to self, increased time for processing and directions, answers questions intermittently & appropriately        Exercises      General Comments General comments (skin integrity, edema, etc.): Sacral pads in place      Pertinent Vitals/Pain Pain Assessment: Faces Faces Pain Scale: Hurts even more Pain Location: Sacrum, back Pain Descriptors / Indicators: Discomfort;Grimacing;Sore Pain Intervention(s): Limited activity within patient's tolerance;Monitored during session;Repositioned    Home Living                          Prior Function            PT Goals (current goals can now be found in the care plan section) Progress towards PT goals: Progressing toward goals    Frequency    Min 2X/week      PT Plan Current plan remains appropriate    Co-evaluation PT/OT/SLP Co-Evaluation/Treatment: Yes Reason for Co-Treatment: To address functional/ADL transfers PT goals addressed during session: Mobility/safety with mobility OT goals addressed during session: ADL's and self-care      AM-PAC PT "6 Clicks" Mobility   Outcome Measure  Help needed turning from your back to your side while in a flat bed without using bedrails?: A Lot Help needed moving from lying on your back to sitting on the side of a flat bed without using bedrails?: A Lot Help needed moving to and from a bed to a chair (including a wheelchair)?: Total Help needed standing up from a chair using your arms (e.g., wheelchair or bedside chair)?: A Lot Help needed to walk in hospital room?: Total Help needed climbing 3-5 steps with a railing?  : Total 6 Click Score: 9    End of Session Equipment Utilized During Treatment: Gait belt Activity Tolerance: Patient limited by fatigue Patient left: in bed;with call bell/phone within reach;with bed alarm set Nurse Communication: Mobility status PT Visit Diagnosis: Other abnormalities of gait and mobility (R26.89);Muscle weakness (generalized) (M62.81);Difficulty in walking, not elsewhere classified (R26.2)     Time: 4401-0272 PT Time Calculation (min) (ACUTE ONLY): 27 min  Charges:                        [employer], SPT

## 2021-06-13 NOTE — Progress Notes (Signed)
Manufacturing engineer [facility]) Hospice hospital RN liaison note  Liaison continues to follow along with patient progress. Patient is appropriate for hospice services in LTC setting.   Liaison will continue to follow  Desiree Mitchell, BSN, RN, [facility] Hospice liaison [phone]

## 2021-06-13 NOTE — Progress Notes (Signed)
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  Patient was last seen normal on 05/19/2021 at 4 PM.  On EMS arrival, patient was noted be hypoxic with SPO2 in the 70s with a normal blood sugar.  EMS with difficulty obtaining blood pressure and patient received 300 mL of LR, 40 mg of lidocaine, 25 mg of Phenergan and an IO was placed in the right humeral head.  Family later reported to EDP that they found her sitting on the toilet slumped with her neck up against a basket.  Of note patient arrived infested with bedbugs and roaches.   In the ED, temperature 91.1 F, HR 95, RR 21, BP 169/123, SPO2 95% on right nonrebreather 15 L.  Sodium 137, potassium 4.8, chloride 97, BUN/creatinine 84/2.1, CO2 17, anion gap 23, AST 155, ALT 59, CK 6755.  WBC 30.3, hemoglobin 15.9, high sensory troponin 22, lactic acid 2.4.  COVID-19 PCR negative.  Influenza A/B PCR negative.  VBG with pH 7.21, PO2 53, PCO2 32.  Chest x-ray with mild left basilar atelectasis without evidence of acute infiltrate.  CT head without contrast interval development of mild hydrocephalus and periventricular edema associated with malpositioned left VA shunt that terminates in the anterior mediastinum and no longer within the left brachiocephalic vein, shunt noted to be coiled within the neck, ground glass opacity right lower lobe and patchy airspace opacity within the left lower lobe.  CT C-spine with no acute fracture or traumatic injury noted.  Neurosurgery was consulted and patient was taken emergently to the OR by Dr. Izora Ribas for removal of her VA  shunt and placement of external ventricular drain.  Patient was initially admitted to the PCCM service and transferred to hospitalist service on 05/30/2021.  Ongoing goal of care discussion.  Apparently started on comfort care and rescinded orders on 10/31.  Assessment & Plan:   Principal Problem:   Sepsis ([place]) Active Problems:   Endotracheally intubated   On mechanically assisted ventilation (HCC)   Shunt malfunction   Rhabdomyolysis   AKI (acute kidney injury) ([place])   Increased anion gap metabolic acidosis   Acute encephalopathy   Transaminitis   Pressure injury of skin   Acute embolic stroke [facility])   Palliative care by specialist   DNR (do not resuscitate)  Severe sepsis, present on admission due to Proteus/staph epidermis septicemia Patient was seen by infectious disease, etiology suspected from her chronic wounds.  No evidence of VA shunt infection with negative CSF cultures.  TTE unrevealing.  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 Wandalee Ferdinand low risk for endocarditis.  Completed 10-day course of ceftriaxone and 14-day course of linezolid.  Infectious disease now signed off.  Resolved.   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  Patient was taken emergently to the OR by neurosurgery, Dr. Izora Ribas on 10/11 with complete removal of ventricular atrial shunt and placement of left parietal approach ventriculostomy.  External ventriculostomy drain  removed by neurosurgery on 05/30/2021.  CSF cultures negative.  No further intervention planned by neurosurgery team at this time.     Acute hypoxic respiratory failure, postoperative: Resolved Following shunt removal and EVD placement, patient remained on mechanical ventilation.  Patient now  extubated and weaned to room air.   Multifocal strokes MRI 11/14 with multifocal infarcts noted, concerning for embolic in nature.  Neurology was consulted.  TTE unrevealing.  Unable to undergo TEE per cardiology due to anatomic abnormalities of epiglottis.  Palliative care was consulted and followed during hospital course, patient has declined PEG tube.  Appetite remains poor.   Essential hypertension Metoprolol tartrate 25 mg p.o. twice daily.  Stable.   Low normal B12 Vitamin B12 level, 214.  Homocysteine level 17.0, within normal limits.  Patient received IM vitamin B 12 supplementation x3 during hospitalization.  Will start on oral replacement.   Rhabdomyolysis Etiology likely secondary to prolonged downtime.  Received IV fluid hydration.  Improved.  Normalized.   Dysphagia Patient and daughter now declined PEG tube placement and planned initially to transition to comfort care.  NG tube was removed and tube feedings discontinued.  Now patient and daughter wish to attempt rehab.  Continue comfort feeds as tolerates.  Speech therapy following.  Now remains on dysphagia 2 diet.   GERD: Resume PPI.   Anemia, anemia of acute blood loss.  Multifactorial.  Also reported history of uterine bleeding.   Vaginal ultrasound with endometrial polyp, vascular stalk.  No active bleeding now.  Outpatient follow-up with GYN. Hemoglobin is 6.8.  No evidence of active GI bleeding or GU bleeding. Resume Protonix. Will transfuse 1 unit of PRBC today after consented from her daughter.  Pressure Injury 05/22/21 Buttocks Right Deep Tissue Pressure Injury - Purple or maroon localized area of discolored intact skin or blood-filled blister due to damage of underlying soft tissue from pressure and/or shear. (Active)  05/22/21 0300  Location: Buttocks  Location Orientation: Right  Staging: Deep Tissue Pressure Injury - Purple or maroon localized area of discolored intact skin or blood-filled blister due to  damage of underlying soft tissue from pressure and/or shear.  Wound Description (Comments):   Present on Admission: Yes     Pressure Injury 05/22/21 Coccyx Stage 2 -  Partial thickness loss of dermis presenting as a shallow open injury with a red, pink wound bed without slough. (Active)  05/22/21 0300  Location: Coccyx  Location Orientation:   Staging: Stage 2 -  Partial thickness loss of dermis presenting as a shallow open injury with a red, pink wound bed without slough.  Wound Description (Comments):   Present on Admission: Yes     Pressure Injury 05/22/21 Cervical Deep Tissue Pressure Injury - Purple or maroon localized area of discolored intact skin or blood-filled blister due to damage of underlying soft tissue from pressure and/or shear. (Active)  05/22/21 0300  Location: Cervical  Location Orientation:   Staging: Deep Tissue Pressure Injury - Purple or maroon localized area of discolored intact skin or blood-filled blister due to damage of underlying soft tissue from pressure and/or shear.  Wound Description (Comments):   Present on Admission: Yes     Pressure Injury 06/05/21 Buttocks Left Unstageable - Full thickness tissue loss in which the base of the injury is covered by slough (yellow, tan, gray, green or brown) and/or eschar (tan, brown or black) in the wound bed. Long curved wound on whole left (Active)  06/05/21 0622  Location: Buttocks  Location Orientation: Left  Staging: Unstageable - Full thickness tissue loss in which the base of the injury is covered by slough (yellow, tan, gray, green or brown) and/or eschar (tan, brown or black) in the wound bed.  Wound Description (Comments): Long curved wound on whole left buttocks. black yellow red pink and bloody  Present on Admission:     Goal of care: Complicated disposition.  Apparently now plan is to go to a skilled rehab.  We will put patient back on vitamin B12, Protonix and other supportive measures.  She will not  tolerate any anticoagulation or aspirin with low hemoglobin. Continue to attempt speech/PT/OT. Refer to skilled nursing rehab. Hemoglobin 6.8, I called her daughter 2 times and was unsuccessful in getting consent.  This is not a life-threatening emergency, will not transfuse without consent. Hopefully she can go to nursing home with palliative care follow-up.     DVT prophylaxis:   SCDs   Code Status: DNR Family Communication: None.  Daughter [name] could not pick up the phone. Disposition Plan: Status is: Inpatient  Remains inpatient appropriate because: Low hemoglobin.  Unsafe discharge home.   Consultants:  Palliative and hospice Neurology Neurosurgery  Procedures:  Multiple procedures as above.  Antimicrobials:  Completed antibiotics.   Subjective: Patient seen and examined.  Poor historian.  Denies any complaints.  She is able to talk with some efforts.  She tells me that she feels weak otherwise she is okay.  No family at bedside.  Nursing reported not enough appetite or interest to eat.  Objective: Vitals:   06/12/21 0601 06/12/21 1135 06/12/21 2226 06/13/21 0735  BP: (!) 165/87 (!) 154/89 (!) 160/88 (!) 156/85  Pulse: (!) 112 88 (!) 103 91  Resp: 16 17 18 20   Temp: 98.8 F (37.1 C) 98 F (36.7 C) 98.8 F (37.1 C) 99 F (37.2 C)  TempSrc: Oral Oral  Oral  SpO2: 94% 95% 96% 98%  Weight:      Height:        Intake/Output Summary (Last 24 hours) at 06/13/2021 1126 Last data filed at 06/13/2021 0700 Gross per 24 hour  Intake 120 ml  Output 275 ml  Net -155 ml   Filed Weights   06/06/21 0500 06/07/21 0500 06/08/21 0500  Weight: 88.8 kg 88.4 kg 87.9 kg    Examination:  General exam: Appears calm and comfortable  Patient is frail and debilitated.  Older than stated age. Respiratory system: Clear to auscultation. Respiratory effort normal.  On room air. Cardiovascular system: S1 & S2 heard, RRR. Gastrointestinal system: Soft.  Nontender.  Bowel sounds  present.  Rectal tube with some black stool. Central nervous system: Alert and oriented to place and person.  Not oriented to time or situation. She has hemiparesis of the left side, can move but lesser than right.  3/5. Lymphedema on the left upper extremity. Skin: Multiple pressure ulcers as above.    Data Reviewed: I have personally reviewed following labs and imaging studies  CBC: Recent Labs  Lab 06/07/21 0615 06/13/21 0640  WBC 7.1 9.9  HGB 10.3* 6.7*  HCT 30.8* 21.2*  MCV 98.7 101.0*  PLT 287 299   Basic Metabolic Panel: Recent Labs  Lab 06/07/21 0615 06/13/21 0640  NA 137 142  K 4.6 4.1  CL 101 107  CO2 30 29  GLUCOSE 121* 124*  BUN 28* 25*  CREATININE 0.80 0.93  CALCIUM 7.9* 8.1*  MG  --  2.1   GFR: Estimated Creatinine Clearance: 63.7 mL/min (by C-G  formula based on SCr of 0.93 mg/dL). Liver Function Tests: Recent Labs  Lab 06/13/21 0640  AST 19  ALT 13  ALKPHOS 63  BILITOT 0.6  PROT 6.3*  ALBUMIN 2.5*   No results for input(s): LIPASE, AMYLASE in the last 168 hours. No results for input(s): AMMONIA in the last 168 hours. Coagulation Profile: No results for input(s): INR, PROTIME in the last 168 hours. Cardiac Enzymes: No results for input(s): CKTOTAL, CKMB, CKMBINDEX, TROPONINI in the last 168 hours. BNP (last 3 results) No results for input(s): PROBNP in the last 8760 hours. HbA1C: No results for input(s): HGBA1C in the last 72 hours. CBG: Recent Labs  Lab 06/07/21 1725 06/07/21 2000 06/08/21 0603 06/08/21 0755 06/08/21 1623  GLUCAP 110* 124* 97 97 83   Lipid Profile: No results for input(s): CHOL, HDL, LDLCALC, TRIG, CHOLHDL, LDLDIRECT in the last 72 hours. Thyroid Function Tests: No results for input(s): TSH, T4TOTAL, FREET4, T3FREE, THYROIDAB in the last 72 hours. Anemia Panel: No results for input(s): VITAMINB12, FOLATE, FERRITIN, TIBC, IRON, RETICCTPCT in the last 72 hours. Sepsis Labs: No results for input(s): PROCALCITON,  LATICACIDVEN in the last 168 hours.  No results found for this or any previous visit (from the past 240 hour(s)).       Radiology Studies: No results found.      Scheduled Meds:  sodium chloride   Intravenous Once   collagenase   Topical Daily   Gerhardt's butt cream   Topical TID   metoprolol tartrate  25 mg Oral BID   pantoprazole  40 mg Oral Daily   vitamin B-12  1,000 mcg Oral Daily   Continuous Infusions:   LOS: 22 days    Time spent: 32 minutes    Barb Merino, MD [facility] Pager [phone]

## 2021-06-13 NOTE — Progress Notes (Signed)
Met with patient's daughter at the bedside to discuss about plan of care.  Updated on current situation.  Low hemoglobin.  This was an error, probably drawn from a running IV fluid.  Repeat hemoglobin 10.6.  Stable and no need for transfusion.  Orders discontinued.  Patient and family is however consented for transfusion if needed in the future.  Goal of care: Detail discussion.  Patient is currently able to eat dysphagia diet, has improved mentation so daughter wants to give her chance as she is not end-of-life at this time.  She will have difficulty tolerating medications, will introduce one by one.  She will work with speech, PT OT.  She will go to a skilled rehab.  Social worker updated. -We will place patient on aspirin 81 mg daily, she is already on metoprolol.  Resume Protonix and vitamin B12. -Remains DNR but not comfort care.

## 2021-06-13 NOTE — TOC Progression Note (Signed)
Transition of Care [facility]) - Progression Note    Patient Details  Name: Desiree Mitchell MRN: [id number] Date of Birth: 1955/12/23  Transition of Care [facility]) CM/SW Contact  Shelbie Hutching, RN Phone Number: 06/13/2021, 11:16 AM  Clinical Narrative:    Family is requesting rehab for patient.  Patient is not medically stable, getting blood today.  TOC will start bed search.     Expected Discharge Plan: Long Term Acute Care (LTAC) Barriers to Discharge: Continued Medical Work up  Expected Discharge Plan and Services Expected Discharge Plan: Long Term Acute Care (LTAC) In-house Referral: Clinical Social Work   Post Acute Care Choice: Long Term Acute Care (LTAC) Living arrangements for the past 2 months: Single Family Home                                       Social Determinants of Health (SDOH) Interventions    Readmission Risk Interventions No flowsheet data found.

## 2021-06-13 NOTE — Progress Notes (Addendum)
Speech Language Pathology Treatment: Dysphagia  Patient Details Name: Desiree Mitchell MRN: [id number] DOB: 05-25-1956 Today's Date: 06/13/2021 Time: 1350-1430 SLP Time Calculation (min) (ACUTE ONLY): 40 min  Assessment / Plan / Recommendation Clinical Impression  Pt seen for ongoing assessment of swallowing. She was sitting in bed w/ lunch tray in front of her, some eaten already. She was awake/alert, verbally responsive and engaged w/ SLP when asked direct questions. She is on 2L [place] today; wbc wnl. Unsure of Cognition currently after this extended illness and shunt repair.   Pt explained general aspiration precautions and agreed verbally to the need for following them especially sitting upright for all oral intake -- supported behind the back more for full upright sitting. Pt assisted w/ positioning d/t overall weakness. She continued to feed herself some items of the lunch meal of purees and Nectar liquids via cup. Opened butterscotch pudding for pt also which she stated her Dtr had brought to her. No immediate, overt clinical s/s of aspiration were noted w/ any consistency; respiratory status remained calm and unlabored, vocal quality clear b/t trials when assessed. Delayed coughing noted x1 of ~8-9 trials of sips of Nectar liquids via cup -- encouraged pt to take SMALLER SIPS one at a time and to use a TSP if needed to remind her to take Small Sips. Oral phase appeared grossly [facility] for bolus management and timely A-P transfer for swallowing; oral clearing achieved w/ consistencies w/ Time and a f/u swallow intermittently b/t trials -- instructed pt on using Lingual Sweeping to clear mouth fully of any residue and to DRY swallow after. Min+ cues needed for follow through w/ instructions. NSG updated; denied any deficits in swallowing a Pill Whole in puree per pt's request earlier (vs Crushed as rec'd).   Pt appears at risk for aspriation d/t oropharyngeal phase Dysphagia; lengthy illness and potential  Cognitive decline. This risk is reduced somewhat when following aspiration precautions and using a Dysphagia diet. Recommend continue a Dysphagia level 2 diet(MINCED) w/ gravies added to moisten foods; Nectar liquids via Cup -- Small, single sips. Recommend aspiration precautions; Pills Crushed vs Whole in Puree; tray setup and positioning assistance for meals. Reduce distractions at meals. Use f/u lingual sweeping and dry swallows when needed. NSG updated. Precautions posted at bedside. Recommend f/u w/ Dietician for support; Cognitive-linguistic evaluation at next venue of care along w/ Palliative Care services for ongoing [place], support. ST services to monitor pt's status while admitted.      HPI HPI: 65 year old female presented at [facility] ED on 05/21/2021. PMHx of subarachnoid hemorrhage s/p drain placement (approximately 2007), breast cancer (port a cath in place), obesity (BMI 34.48) presented on 10/10 with altered mental status found unresponsive by family. Upon arrival imaging completed, including, "CXR 05/21/2021: Mild left basilar atelectasis without evidence of an acute infiltrate  CT head without contrast 05/21/2021: Interval development of mild hydrocephalus and periventricular edema, associated malpositioned left VA shunt that terminates in the anterior mediastinum and no longer within the left brachiocephalic vein.  Shunt noted to be coiled within the neck.  Groundglass opacity within the right lower lobe and patchy airspace opacity within the left lower lobe." She has a history of prior subarachnoid hemorrhage requiring shunt placement. Resulting in neurosurgery consult. CT head showed increased ventricular size concerning for shunt malfunction. She underwent removal of shunt and placement of EVD on 05/21/21.  Pt and Dtr have now decided on Rehab [place]; not Hospice services/support per chart notes/MD.  SLP Plan  Continue with current plan of care      Recommendations for follow up therapy  are one component of a multi-disciplinary discharge planning process, led by the attending physician.  Recommendations may be updated based on patient status, additional functional criteria and insurance authorization.    Recommendations  Diet recommendations: Dysphagia 2 (fine chop);Nectar-thick liquid Liquids provided via: Teaspoon;Cup (for small, single sips) Medication Administration: Crushed with puree (is recommended but if able to manage Whole in Puree, then that is ok) Supervision: Patient able to self feed;Intermittent supervision to cue for compensatory strategies (setup too) Compensations: Minimize environmental distractions;Slow rate;Small sips/bites;Lingual sweep for clearance of pocketing;Multiple dry swallows after each bite/sip;Follow solids with liquid Postural Changes and/or Swallow Maneuvers: Out of bed for meals;Seated upright 90 degrees;Upright 30-60 min after meal                General recommendations:  (Palliative Care follow up for [place]; Dietician f/u) Oral Care Recommendations: Oral care BID;Oral care before and after PO;Staff/trained caregiver to provide oral care Follow up Recommendations: Skilled Nursing facility;24 hour supervision/assistance SLP Visit Diagnosis: Dysphagia, oropharyngeal phase (R13.12) (unsure of Cognition) Plan: Continue with current plan of care       GO                 Orinda Kenner, [place], [place] Pathologist Rehab Services [phone] [facility]  06/13/2021, 3:39 PM

## 2021-06-13 NOTE — TOC Progression Note (Signed)
Transition of Care [facility]) - Progression Note    Patient Details  Name: Samari Bittinger MRN: [id number] Date of Birth: 1956-02-12  [facility]) CM/SW Contact  Shelbie Hutching, RN Phone Number: 06/13/2021, 1:58 PM  Clinical Narrative:    Patient sitting up in bed, daughter at the bedside.  Patient reports she is hungry.  Daughter wants patient to go for rehab.  Bed search started.   Expected Discharge Plan: [place] Barriers to Discharge: No SNF bed  Expected Discharge Plan and Services Expected Discharge Plan: [place] In-house Referral: Clinical Social Work Discharge Planning Services: CM Consult Post Acute Care Choice: [place] arrangements for the past 2 months: Single Family Home                 DME Arranged: N/A DME Agency: NA       HH Arranged: NA HH Agency: NA         Social Determinants of Health (SDOH) Interventions    Readmission Risk Interventions No flowsheet data found.

## 2021-06-13 NOTE — Progress Notes (Signed)
Nutrition Follow-up  DOCUMENTATION CODES:   Obesity unspecified  INTERVENTION:   -Nepro Shake po TID, each supplement provides 425 kcal and 19 grams protein  -MVI with minerals daily -Magic cup BID with meals, each supplement provides 290 kcal and 9 grams of protein   NUTRITION DIAGNOSIS:   Inadequate oral intake related to acute illness as evidenced by NPO status.  Progressing; advanced to PO diet on 06/12/21  GOAL:   Patient will meet greater than or equal to 90% of their needs  Progressing   MONITOR:   Labs, Weight trends, TF tolerance, Skin, I & O's  REASON FOR ASSESSMENT:   Consult Assessment of nutrition requirement/status  ASSESSMENT:   65 y/o female with h/o HTN, asthma, breast cancer, CVA and subarachnoid hemorrhage s/p shunt placement for management of hydrocephalus > 10 years ago who is now admitted with shunt malfunction, sepsis and AMS now s/p left parietal approach ventriculostomy 10/11  10/28- refused PEG placement, transitioned to comfort care 10/31- comfort care rescinded  11/1- s/p BSE- advanced to dysphagia 2 diet with nectar thick liquids  Reviewed I/O's: -155 ml x 24 hours and -446 ml since 05/30/21  UOP: 275 ml x 24 hours  Case discussed with SLP. Comfort care was rescinded and pt has been advanced to diet. Family desires to d/c to SNF.   Medications reviewed and include vitamin B-12.   Labs reviewed: CBGS: 83-97 (inpatient orders for glycemic control are ).    Diet Order:   Diet Order             DIET DYS 2 Room service appropriate? Yes with Assist; Fluid consistency: Nectar Thick  Diet effective now                   EDUCATION NEEDS:   No education needs have been identified at this time  Skin:  Skin Assessment: Skin Integrity Issues: Skin Integrity Issues:: Incisions, Unstageable, Stage II, DTI DTI: rt buttocks, cervical Stage II: coccyx Unstageable: lt buttocks Incisions: closed head and lt thorat  Last BM:  06/08/21  (via rectal tube)  Height:   Ht Readings from Last 1 Encounters:  06/04/21 5\' 2"  (1.575 m)    Weight:   Wt Readings from Last 1 Encounters:  06/08/21 87.9 kg    Ideal Body Weight:  50 kg  BMI:  Body mass index is 35.44 kg/m.  Estimated Nutritional Needs:   Kcal:  1800-2100kcal/day  Protein:  90-105g/day  Fluid:  1.5-1.8L/day    Loistine Chance, RD, LDN, [place] Registered Dietitian II Certified Diabetes Care and Education Specialist Please refer to AMION for RD and/or RD on-call/weekend/after hours pager

## 2021-06-13 NOTE — Progress Notes (Signed)
OT Cancellation Note  Patient Details Name: Oneisha Ammons MRN: [id number] DOB: 06/21/1956   Cancelled Treatment:    Reason Eval/Treat Not Completed: Medical issues which prohibited therapy. Pt with current hemoglobin of 6.7 which is contraindicated for therapeutic intervention. Pt getting blood transfusion today. OT to hold today and re-attempt when pt is able to participate.   Darleen Crocker, MS, OTR/L , CBIS ascom [phone]  06/13/21, 11:44 AM

## 2021-06-13 NOTE — Progress Notes (Signed)
Occupational Therapy Treatment Patient Details Name: Desiree Mitchell MRN: [id number] DOB: 1955/09/11 Today's Date: 06/13/2021   History of present illness Pt is a 65 y.o. female presenting to hospital 05/21/21 with AMS (found unresponsive by family).  Significant pressure sore present back of neck, buttocks, and coccyx.  Pt noted to be infested with bed bugs and roaches.  Pt admitted with acute respiratory failure, rhabdomyolysis, metabolic acidosis, acute kidney injury, sepsis, VA shunt malfunction, transaminitis, and posterior neck/buttocks/coccyx pressure injuries.  S/p removal ventriculoatrial shunt and placement of L parietal approach ventriculostomy 05/22/21; extubated 10/11; MRI brain 10/14 showing few scattered small acute infarcts involving different vascular territories (watershed type infarcts); TEE 10/18; and EVD removed 10/19.    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   OT comments  Upon entering the room, pt in side lying and having just finished bath with NT. Pt seen with PT for skilled co-treatment. Pt agreeable to therapeutic intervention although she is tearful and reports not feeling well. Pt requirted mod - max A for bed mobility to EOB. Pt standing x 3 reps with mod A for 2 hand held. Pt needing manual facilitation and cuing for upright posture and forward head. Pt unable to lateral scoot to get closer to [facility] secondary to fatigue requiring +2 assist to return to bed. All needs within reach and bed alarm activated.    Recommendations for follow up therapy are one component of a multi-disciplinary discharge planning process, led by the attending physician.  Recommendations may be updated based on patient status, additional functional criteria and insurance authorization.    Follow Up Recommendations  Skilled nursing-short term rehab (<3 hours/day)    Assistance Recommended at Discharge Frequent or constant  Supervision/Assistance  Equipment Recommendations  Other (comment) (defer to next venue of care)       Precautions / Restrictions Precautions Precautions: Fall Precaution Comments: Aspiration; gastric tube; R chest port; posterior neck, buttocks, and coccyx pressure injuries Restrictions Weight Bearing Restrictions: No Other Position/Activity Restrictions: Per MD Posey Pronto 06/01/21 pt cleared for therapy and OOB mobility (not on bedrest anymore)       Mobility Bed Mobility Overal bed mobility: Needs Assistance Bed Mobility: Supine to Sit;Sit to Supine;Rolling Rolling: Min assist;Mod assist   Supine to sit: Max assist;+2 for physical assistance Sit to supine: Max assist;+2 for physical assistance   General bed mobility comments: Requires trunk and BLE support    Transfers Overall transfer level: Needs assistance Equipment used: 2 person hand held assist Transfers: Sit to/from Stand Sit to Stand: Max assist;+2 physical assistance          Lateral/Scoot Transfers: Max assist;From elevated surface General transfer comment: STS x 3 Clears bottom but does not initiate knee and hip extension required for full upright standing w/ MAX-A; attempted lateral scooting to [facility] and pt unable to clear bottom with MAX-A;     Balance Overall balance assessment: Needs assistance Sitting-balance support: Feet supported;Bilateral upper extremity supported Sitting balance-Leahy Scale: Fair Sitting balance - Comments: requires BUE support, does not require physical assist if BUE on surface but cannot tolerate challenge   Standing balance support: Bilateral upper extremity supported Standing balance-Leahy Scale: Poor                             ADL either performed or assessed with clinical judgement     Vision Patient Visual Report: No  change from baseline            Cognition Arousal/Alertness: Awake/alert Behavior During Therapy: Flat affect Overall Cognitive Status: No  family/caregiver present to determine baseline cognitive functioning                                 General Comments: Oriented to self, increased time for processing and directions, answers questions intermittently & appropriately                General Comments Sacral pads in place    Pertinent Vitals/ Pain       Pain Assessment: Faces Faces Pain Scale: Hurts even more Pain Location: Sacrum, back Pain Descriptors / Indicators: Discomfort;Grimacing;Sore Pain Intervention(s): Limited activity within patient's tolerance;Monitored during session;Repositioned         Frequency  Min 2X/week        Progress Toward Goals  OT Goals(current goals can now be found in the care plan section)  Progress towards OT goals: Progressing toward goals  Acute Rehab OT Goals Patient Stated Goal: to go home OT Goal Formulation: With patient Time For Goal Achievement: 06/25/21 Potential to Achieve Goals: [place] Discharge plan remains appropriate;Frequency remains appropriate    Co-evaluation    PT/OT/SLP Co-Evaluation/Treatment: Yes Reason for Co-Treatment: Complexity of the patient's impairments (multi-system involvement);For patient/therapist safety PT goals addressed during session: Mobility/safety with mobility;Balance OT goals addressed during session: ADL's and self-care      AM-PAC OT "6 Clicks" Daily Activity     Outcome Measure   Help from another person eating meals?: A Little Help from another person taking care of personal grooming?: A Little Help from another person toileting, which includes using toliet, bedpan, or urinal?: Total Help from another person bathing (including washing, rinsing, drying)?: A Lot Help from another person to put on and taking off regular upper body clothing?: A Lot Help from another person to put on and taking off regular lower body clothing?: A Lot 6 Click Score: 13    End of Session    OT Visit Diagnosis: Muscle  weakness (generalized) (M62.81);Adult, failure to thrive (R62.7);Other symptoms and signs involving cognitive function   Activity Tolerance Patient tolerated treatment well   Patient Left in bed;with call bell/phone within reach;with bed alarm set   Nurse Communication Other (comment) (on RA)        Time: 5449-2010 OT Time Calculation (min): 27 min  Charges: OT General Charges $OT Visit: 1 Visit OT Treatments $Therapeutic Activity: 8-22 mins  Darleen Crocker, MS, OTR/L , CBIS ascom [phone]  06/13/21, 4:31 PM

## 2021-06-13 NOTE — Progress Notes (Signed)
PT Cancellation Note  Patient Details Name: Desiree Mitchell MRN: [id number] DOB: 20-Jul-1956   Cancelled Treatment:    Reason Eval/Treat Not Completed: Medical issues which prohibited therapy. Patient noted with critically low hemoglobin (6.7) , per PT practice guidelines contraindicated for exertional activity at this time.  Per RN pt to have a transfusion. Will continue efforts next date pending medical stability and appropriateness.  Lieutenant Diego PT, DPT 9:27 AM,06/13/21

## 2021-06-13 NOTE — NC FL2 (Signed)
[place] LEVEL OF CARE SCREENING TOOL     IDENTIFICATION  Patient Name: Desiree Mitchell Birthdate: 07/02/1956 Sex: female Admission Date (Current Location): 05/21/2021  [facility] and [place] Number:  Engineering geologist and Address:  [facility], [address] [place], [place] [address]      Provider Number: [id number]  Attending Physician Name and Address:  Barb Merino, MD  Relative Name and Phone Number:  newcomb,tonya (Daughter)   [phone]    Current Level of Care: Hospital Recommended Level of Care: [place] Prior Approval Number:    Date Approved/Denied:   PASRR Number: [id number]  Discharge Plan: SNF    Current Diagnoses: Patient Active Problem List   Diagnosis Date Noted   DNR (do not resuscitate) 06/09/2021   Palliative care by specialist    Acute embolic stroke ([place]) 94/76/5465   Sepsis ([place]) 05/22/2021   Endotracheally intubated 05/22/2021   On mechanically assisted ventilation ([place]) 05/22/2021   Shunt malfunction 05/22/2021   Rhabdomyolysis 05/22/2021   AKI (acute kidney injury) ([place]) 05/22/2021   Increased anion gap metabolic acidosis 03/54/6568   Acute encephalopathy 05/22/2021   Transaminitis 05/22/2021   Pressure injury of skin 05/22/2021    Orientation RESPIRATION BLADDER Height & Weight     Self, Place  O2 Indwelling catheter Weight: 87.9 kg Height:  5\' 2"  (157.5 cm)  BEHAVIORAL SYMPTOMS/MOOD NEUROLOGICAL BOWEL NUTRITION STATUS      Incontinent Diet (Dysphagia 2- minced meats, purees, nectar thick liquids)  AMBULATORY STATUS COMMUNICATION OF NEEDS Skin   Extensive Assist Verbally PU Stage and Appropriate Care, Surgical wounds (closed incision left neck and head.  Pressure injury buttocks and coccyx)                       Personal Care Assistance Level of Assistance  Bathing, Feeding, Dressing Bathing Assistance: Maximum assistance Feeding assistance: Limited  assistance Dressing Assistance: Maximum assistance     Functional Limitations Info  Sight, Hearing, Speech Sight Info: Adequate Hearing Info: Adequate Speech Info: Adequate    SPECIAL CARE FACTORS FREQUENCY  PT (By licensed PT), OT (By licensed OT)     PT Frequency: 5 times per week OT Frequency: 5 times per week            Contractures Contractures Info: Not present    Additional Factors Info  Code Status, Allergies Code Status Info: DNR Allergies Info: NKA           Current Medications (06/13/2021):  This is the current hospital active medication list Current Facility-Administered Medications  Medication Dose Route Frequency Provider Last Rate Last Admin   acetaminophen (TYLENOL) tablet 650 mg  650 mg Oral Q6H PRN Jordan Hawks, FNP   650 mg at 06/13/21 1275   Or   acetaminophen (TYLENOL) suppository 650 mg  650 mg Rectal Q6H PRN Jordan Hawks, FNP       albuterol (PROVENTIL) (2.5 MG/3ML) 0.083% nebulizer solution 2.5 mg  2.5 mg Nebulization Q4H PRN [place], Huel Cote, NP       aspirin EC tablet 81 mg  81 mg Oral Daily Barb Merino, MD       collagenase (SANTYL) ointment   Topical Daily Fritzi Mandes, MD   Given at 06/12/21 1844   feeding supplement (NEPRO CARB STEADY) liquid 237 mL  237 mL Oral TID BM Barb Merino, MD       Gerhardt's butt cream   Topical TID Fritzi Mandes, MD   Given  at 06/12/21 2231   glycopyrrolate (ROBINUL) tablet 1 mg  1 mg Oral Q4H PRN Jordan Hawks, FNP       Or   glycopyrrolate (ROBINUL) injection 0.2 mg  0.2 mg Subcutaneous Q4H PRN Jordan Hawks, FNP       Or   glycopyrrolate (ROBINUL) injection 0.2 mg  0.2 mg Intravenous Q4H PRN Jordan Hawks, FNP   0.2 mg at 06/08/21 2206   haloperidol (HALDOL) tablet 0.5 mg  0.5 mg Oral Q4H PRN Jordan Hawks, FNP       Or   haloperidol (HALDOL) 2 MG/ML solution 0.5 mg  0.5 mg Sublingual Q4H PRN Jordan Hawks, FNP       Or   haloperidol lactate (HALDOL) injection 0.5 mg  0.5 mg Intravenous  Q4H PRN Jordan Hawks, FNP       metoprolol tartrate (LOPRESSOR) tablet 25 mg  25 mg Oral BID [place] Eric J, DO   25 mg at 06/13/21 3762   multivitamin with minerals tablet 1 tablet  1 tablet Oral Daily Barb Merino, MD       ondansetron (ZOFRAN-ODT) disintegrating tablet 4 mg  4 mg Oral Q6H PRN Jordan Hawks, FNP       Or   ondansetron [facility]) injection 4 mg  4 mg Intravenous Q6H PRN Jordan Hawks, FNP   4 mg at 06/12/21 0945   pantoprazole (PROTONIX) EC tablet 40 mg  40 mg Oral Daily Barb Merino, MD   40 mg at 06/13/21 1137   polyvinyl alcohol (LIQUIFILM TEARS) 1.4 % ophthalmic solution 1 drop  1 drop Both Eyes QID PRN Jordan Hawks, FNP       vitamin B-12 (CYANOCOBALAMIN) tablet 1,000 mcg  1,000 mcg Oral Daily Barb Merino, MD   1,000 mcg at 06/13/21 1136     Discharge Medications: Please see discharge summary for a list of discharge medications.  Relevant Imaging Results:  Relevant Lab Results:   Additional Information SS# [id number]  Shelbie Hutching, RN

## 2021-06-14 DIAGNOSIS — A419 Sepsis, unspecified organism: Secondary | ICD-10-CM | POA: Diagnosis not present

## 2021-06-14 DIAGNOSIS — J9601 Acute respiratory failure with hypoxia: Secondary | ICD-10-CM | POA: Diagnosis not present

## 2021-06-14 DIAGNOSIS — R6520 Severe sepsis without septic shock: Secondary | ICD-10-CM | POA: Diagnosis not present

## 2021-06-14 NOTE — Plan of Care (Signed)
  Problem: Nutrition: Goal: Adequate nutrition will be maintained Outcome: Progressing   Problem: Pain Managment: Goal: General experience of comfort will improve Outcome: Progressing   Problem: Safety: Goal: Ability to remain free from injury will improve Outcome: Progressing   

## 2021-06-14 NOTE — Progress Notes (Signed)
Air mattress size wise bed was not delivered notified incoming shift.

## 2021-06-14 NOTE — Progress Notes (Signed)
[facility]) Hospital Liaison Note  Plan remains for hospice services in LTC setting. Hospital liaison will continue to follow for discharge disposition.  Please do not hesitate to call with any hospice related questions or concerns.   Thank you,  Bobbie "Loren Racer, [place], BSN [facility] Liaison  [phone]

## 2021-06-14 NOTE — Progress Notes (Signed)
PROGRESS NOTE    Desiree Mitchell  EUM:[id number] DOB: [birth year] DOA: 05/21/2021 PCP: Pcp, No   Chief Complain: Found unresponsive  Brief Narrative: Patient is a 65 year old female with history of breast cancer status post bilateral mastectomy, history of VA shunt  and prior subarachnoid hemorrhage/hydrocephalus, CVA with residual right-sided deficit, asthma, hypertension who presented at emergency department after she was found not responsive by family.  On presentation, she was hypoxic, hypotensive.  She was also found to be infested with bedbugs and cockroaches.  CT head without contrast showed development of mild hydrocephalus, periventricular edema associated with malpositioned left VA shunt.  She was seen by neurosurgery and was adequately taken to the OR for removal of her VA shunt and placement of external ventricular drain.  Patient was transferred to hospital service on 05/30/2021.  Several discussion was held with family about goals of care.  Initially she was on comfort care but since her mental status slowly improved, family wanted to discontinue comfort care, family decided on placing her in a skilled nursing facility.  Case manager following.  Hospice also following and recommending hospice support on long-term care.  Assessment & Plan:   Principal Problem:   Sepsis ([place]) Active Problems:   Endotracheally intubated   On mechanically assisted ventilation (HCC)   Shunt malfunction   Rhabdomyolysis   AKI (acute kidney injury) ([place])   Increased anion gap metabolic acidosis   Acute encephalopathy   Transaminitis   Pressure injury of skin   Acute embolic stroke ([place])   Palliative care by specialist   DNR (do not resuscitate)   Severe sepsis/Proteus/staph epidermidis septicemia: ID was following.  Etiology was suspected to be from chronic wounds.  No evidence of recent infection with negative CSF cultures.  TTE unrevealing.  Unable to perform TEE due to hemodynamic abnormalities  of her epiglottis.  Completed 10 days course of ceftriaxone and for the source of linezolid.  ID signed off.  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  CT head showed development of periventricular edema associated with malpositioned left VA shunt.  Emergently taken to the OR on 10/11 with complete removal of ventriculoperitoneal shunt and placement of left parietal approach ventriculostomy.  External ventriculostomy drain removed by neurosurgery on 05/30/21.  CSF cultures negative.  No further intervention planned by neurosurgery team.  Acute hypoxic respiratory failure: Resolved.  Patient remains on mechanical ventilation following some removal and EVD placement.  Currently on room air  Multifocal strokes: As per MRI which showed multifocal infarcts, concerning for embolic in nature.  Neurology was consulted.  TTE unrevealing.  Unable to undergo TEE.  She has history of right-sided residual deficits  Hypertension: Currently mildly hypertensive.  Continue current medications, monitor blood pressure  Low normal vitamin B12: Received 3 days of IM vitamin B12 supplementation.  Continue oral supplementation.  Dysphagia: Had poor oral intake.  She declined PEG tube placement.  NG tube has been removed, tube feeding discontinued.  Continue comfort feeds.  Speech therapy following.  On dysphagia 2 diet.  GERD: Continue PPI  Normocytic anemia: Continue to monitor H&H.  She was transfused with a unit of PRBC on 11/2.  Hemoglobin today stable.  Goals of care: Palliative care following.  Initially she was on comfort care which has been discontinued because her oral status improved.  Case manager is following for placing her on skilled nursing facility.  Palliative care recommending hospice at the skilled nursing facility  Pressure Injury  05/22/21 Buttocks Right Deep Tissue Pressure Injury - Purple or maroon localized  area of discolored intact skin or blood-filled blister due to damage of underlying soft tissue from pressure and/or shear. (Active)  05/22/21 0300  Location: Buttocks  Location Orientation: Right  Staging: Deep Tissue Pressure Injury - Purple or maroon localized area of discolored intact skin or blood-filled blister due to damage of underlying soft tissue from pressure and/or shear.  Wound Description (Comments):   Present on Admission: Yes     Pressure Injury 05/22/21 Coccyx Stage 2 -  Partial thickness loss of dermis presenting as a shallow open injury with a red, pink wound bed without slough. (Active)  05/22/21 0300  Location: Coccyx  Location Orientation:   Staging: Stage 2 -  Partial thickness loss of dermis presenting as a shallow open injury with a red, pink wound bed without slough.  Wound Description (Comments):   Present on Admission: Yes     Pressure Injury 05/22/21 Cervical Deep Tissue Pressure Injury - Purple or maroon localized area of discolored intact skin or blood-filled blister due to damage of underlying soft tissue from pressure and/or shear. (Active)  05/22/21 0300  Location: Cervical  Location Orientation:   Staging: Deep Tissue Pressure Injury - Purple or maroon localized area of discolored intact skin or blood-filled blister due to damage of underlying soft tissue from pressure and/or shear.  Wound Description (Comments):   Present on Admission: Yes     Pressure Injury 06/05/21 Buttocks Left Unstageable - Full thickness tissue loss in which the base of the injury is covered by slough (yellow, tan, gray, green or brown) and/or eschar (tan, brown or black) in the wound bed. Long curved wound on whole left (Active)  06/05/21 0622  Location: Buttocks  Location Orientation: Left  Staging: Unstageable - Full thickness tissue loss in which the base of the injury is covered by slough (yellow, tan, gray, green or brown) and/or eschar (tan, brown or black) in the wound bed.   Wound Description (Comments): Long curved wound on whole left buttocks. black yellow red pink and bloody  Present on Admission:             Nutrition Problem: Inadequate oral intake Etiology: acute illness      DVT prophylaxis:SCD Code Status: DNR Family Communication:  Status is: Inpatient      Consultants: Neurosurgery, PCCM, palliative care  Procedures: As above  Antimicrobials:  Anti-infectives (From admission, onward)    Start     Dose/Rate Route Frequency Ordered Stop   05/30/21 2200  linezolid (ZYVOX) tablet 600 mg        600 mg Per Tube Every 12 hours 05/30/21 1512 06/06/21 2105   05/24/21 1800  linezolid (ZYVOX) IVPB 600 mg  Status:  Discontinued        600 mg 300 mL/hr over 60 Minutes Intravenous Every 12 hours 05/24/21 1542 05/30/21 1512   05/24/21 1000  cefTRIAXone (ROCEPHIN) 2 g in sodium chloride 0.9 % 100 mL IVPB        2 g 200 mL/hr over 30 Minutes Intravenous Every 24 hours 05/23/21 1510 05/31/21 0926   05/24/21 0000  vancomycin (VANCOCIN) IVPB 1000 mg/200 mL premix  Status:  Discontinued        1,000 mg 200 mL/hr over 60 Minutes Intravenous Every 48 hours 05/22/21 0303 05/23/21 1510   05/23/21 1000  ceFEPIme (MAXIPIME) 2 g in sodium chloride 0.9 % 100 mL IVPB        2 g  200 mL/hr over 30 Minutes Intravenous Every 12 hours 05/23/21 0937 05/23/21 2135   05/22/21 2100  ceFEPIme (MAXIPIME) 2 g in sodium chloride 0.9 % 100 mL IVPB  Status:  Discontinued        2 g 200 mL/hr over 30 Minutes Intravenous Every 24 hours 05/22/21 0304 05/23/21 0937   05/21/21 2100  vancomycin (VANCOREADY) IVPB 2000 mg/400 mL        2,000 mg 200 mL/hr over 120 Minutes Intravenous  Once 05/21/21 2029 05/22/21 0027   05/21/21 2030  ceFEPIme (MAXIPIME) 2 g in sodium chloride 0.9 % 100 mL IVPB        2 g 200 mL/hr over 30 Minutes Intravenous  Once 05/21/21 2023 05/21/21 2210   05/21/21 2030  metroNIDAZOLE (FLAGYL) IVPB 500 mg        500 mg 100 mL/hr over 60 Minutes  Intravenous  Once 05/21/21 2023 05/21/21 2315   05/21/21 2030  vancomycin (VANCOCIN) IVPB 1000 mg/200 mL premix  Status:  Discontinued        1,000 mg 200 mL/hr over 60 Minutes Intravenous  Once 05/21/21 2023 05/21/21 2029       Subjective:  Patient seen and examined at the bedside this morning.  She was lying in bed, without any distress.  Not agitated.  Confused, disoriented.  Foley in place.  She was asking when she can go home.  Objective: Vitals:   06/12/21 2226 06/13/21 0735 06/13/21 2100 06/14/21 0751  BP: (!) 160/88 (!) 156/85 (!) 167/93 (!) 155/78  Pulse: (!) 103 91 (!) 103 87  Resp: 18 20 18 20   Temp: 98.8 F (37.1 C) 99 F (37.2 C) 99 F (37.2 C) 98.4 F (36.9 C)  TempSrc:  Oral Oral Oral  SpO2: 96% 98% 94% 98%  Weight:      Height:        Intake/Output Summary (Last 24 hours) at 06/14/2021 0842 Last data filed at 06/13/2021 2200 Gross per 24 hour  Intake 360 ml  Output 200 ml  Net 160 ml   Filed Weights   06/06/21 0500 06/07/21 0500 06/08/21 0500  Weight: 88.8 kg 88.4 kg 87.9 kg    Examination:  General exam: Overall comfortable, not in distress, obese HEENT: PERRL Respiratory system:  no wheezes or crackles  Cardiovascular system: S1 & S2 heard, RRR.  Gastrointestinal system: Abdomen is nondistended, soft and nontender. Central nervous system: Alert and awake, disoriented , right hemiparesis Extremities: Bilateral trace lower extremity edema, no clubbing ,no cyanosis, lymphedema of left upper extremity Skin: Pressure ulcers as above    GU: Foley    Data Reviewed: I have personally reviewed following labs and imaging studies  CBC: Recent Labs  Lab 06/13/21 0640 06/13/21 1110  WBC 9.9  --   HGB 6.7* 10.6*  HCT 21.2* 33.3*  MCV 101.0*  --   PLT 340  --    Basic Metabolic Panel: Recent Labs  Lab 06/13/21 0640  NA 142  K 4.1  CL 107  CO2 29  GLUCOSE 124*  BUN 25*  CREATININE 0.93  CALCIUM 8.1*  MG 2.1   GFR: Estimated Creatinine  Clearance: 63.7 mL/min (by C-G formula based on SCr of 0.93 mg/dL). Liver Function Tests: Recent Labs  Lab 06/13/21 0640  AST 19  ALT 13  ALKPHOS 63  BILITOT 0.6  PROT 6.3*  ALBUMIN 2.5*   No results for input(s): LIPASE, AMYLASE in the last 168 hours. No results for input(s): AMMONIA in the last  168 hours. Coagulation Profile: No results for input(s): INR, PROTIME in the last 168 hours. Cardiac Enzymes: No results for input(s): CKTOTAL, CKMB, CKMBINDEX, TROPONINI in the last 168 hours. BNP (last 3 results) No results for input(s): PROBNP in the last 8760 hours. HbA1C: No results for input(s): HGBA1C in the last 72 hours. CBG: Recent Labs  Lab 06/07/21 1725 06/07/21 2000 06/08/21 0603 06/08/21 0755 06/08/21 1623  GLUCAP 110* 124* 97 97 83   Lipid Profile: No results for input(s): CHOL, HDL, LDLCALC, TRIG, CHOLHDL, LDLDIRECT in the last 72 hours. Thyroid Function Tests: No results for input(s): TSH, T4TOTAL, FREET4, T3FREE, THYROIDAB in the last 72 hours. Anemia Panel: No results for input(s): VITAMINB12, FOLATE, FERRITIN, TIBC, IRON, RETICCTPCT in the last 72 hours. Sepsis Labs: No results for input(s): PROCALCITON, LATICACIDVEN in the last 168 hours.  No results found for this or any previous visit (from the past 240 hour(s)).       Radiology Studies: No results found.      Scheduled Meds:  aspirin EC  81 mg Oral Daily   collagenase   Topical Daily   feeding supplement (NEPRO CARB STEADY)  237 mL Oral TID BM   Gerhardt's butt cream   Topical TID   metoprolol tartrate  25 mg Oral BID   multivitamin with minerals  1 tablet Oral Daily   pantoprazole  40 mg Oral Daily   vitamin B-12  1,000 mcg Oral Daily   Continuous Infusions:   LOS: 23 days    Time spent:35 mins. More than 50% of that time was spent in counseling and/or coordination of care.      Shelly Coss, MD [facility] P11/10/2020, 8:42 AM

## 2021-06-14 NOTE — Plan of Care (Signed)
  Problem: Pain Managment: Goal: General experience of comfort will improve Outcome: Progressing   Problem: Safety: Goal: Ability to remain free from injury will improve Outcome: Progressing   

## 2021-06-14 NOTE — TOC Progression Note (Signed)
Transition of Care [facility]) - Progression Note    Patient Details  Name: Desiree Mitchell MRN: [id number] Date of Birth: [birth year]  Transition of Care [facility]) CM/SW Contact  Shelbie Hutching, RN Phone Number: 06/14/2021, 10:07 AM  Clinical Narrative:    Patient has 2 bed offers one in [place] and the other in [place].  Voicemail left for daughter, [name], to choose a bed offer and to return RNCM call.     Expected Discharge Plan: [place] Barriers to Discharge: No SNF bed  Expected Discharge Plan and Services Expected Discharge Plan: [place] In-house Referral: Clinical Social Work Discharge Planning Services: CM Consult Post Acute Care Choice: [place] arrangements for the past 2 months: Single Family Home                 DME Arranged: N/A DME Agency: NA       HH Arranged: NA HH Agency: NA         Social Determinants of Health (SDOH) Interventions    Readmission Risk Interventions No flowsheet data found.

## 2021-06-14 NOTE — Progress Notes (Signed)
Speech Language Pathology Treatment: Dysphagia  Patient Details Name: Desiree Mitchell MRN: [id number] DOB: 08/28/1955 Today's Date: 06/14/2021 Time: 5465-6812 SLP Time Calculation (min) (ACUTE ONLY): 40 min  Assessment / Plan / Recommendation Clinical Impression  Pt seen for ongoing assessment of swallowing. She was sitting in bed; awake/alert, verbally responsive and engaged w/ SLP when asked direct questions. She is on 2L [place]; wbc wnl. Unsure of Cognition(baseline?) currently after this extended illness and shunt repair. Pt nodded to wanting to watch the Price is Right on tv but was labile intermittently during the show.   Pt explained general aspiration precautions and agreed verbally to the need for following them especially sitting upright for all oral intake -- supported behind the back more for full upright sitting. She was given setup of snacks, then fed herself items of softened solids in puree and Nectar liquids via cup. No immediate, overt clinical s/s of aspiration were noted w/ any consistency; respiratory status remained calm and unlabored, vocal quality clear b/t trials when assessed. Delayed throat clearing noted x2 during sips of Nectar liquids via cup -- encouraged pt to take SMALLER SIPS one at a time and to use a TSP if needed to remind her to take Small Sips. Oral phase appeared grossly [facility] for bolus management and timely A-P transfer for swallowing; oral clearing achieved w/ consistencies w/ Time and a f/u swallow intermittently b/t trials -- instructed pt on using Lingual Sweeping to clear mouth fully of any residue and to DRY swallow after. Min+ cues needed for follow through w/ instructions. NSG updated; denied any deficits in swallowing a Pill Whole in puree per pt's request earlier (vs Crushed as rec'd).   Pt appears at risk for aspriation d/t oropharyngeal phase Dysphagia; lengthy illness and potential Cognitive decline. This risk is reduced somewhat when following aspiration  precautions and using a Dysphagia diet. Recommend continue a Dysphagia level 2 diet(MINCED) w/ gravies added to moisten foods; Nectar liquids via Cup -- Small, single sips. Recommend aspiration precautions; Pills Crushed vs Whole in Puree; tray setup and positioning assistance for meals. Reduce distractions at meals. Use f/u lingual sweeping and dry swallows when needed. NSG updated. Precautions posted at bedside. Recommend f/u w/ Dietician for support; Cognitive-linguistic evaluation at next venue of care along w/ Palliative Care services for ongoing [place], support. ST services to monitor pt's status while admitted.       HPI HPI: 65 year old female presented at [facility] on [facility]/21/2021. PMHx of subarachnoid hemorrhage s/p drain placement (approximately 2007), breast cancer (port a cath in place), obesity (BMI 34.48) presented on 10/10 with altered mental status found unresponsive by family. Upon arrival imaging completed, including, "CXR 05/21/2021: Mild left basilar atelectasis without evidence of an acute infiltrate  CT head without contrast 05/21/2021: Interval development of mild hydrocephalus and periventricular edema, associated malpositioned left VA shunt that terminates in the anterior mediastinum and no longer within the left brachiocephalic vein.  Shunt noted to be coiled within the neck.  Groundglass opacity within the right lower lobe and patchy airspace opacity within the left lower lobe." She has a history of prior subarachnoid hemorrhage requiring shunt placement. Resulting in neurosurgery consult. CT head showed increased ventricular size concerning for shunt malfunction. She underwent removal of shunt and placement of EVD on 05/21/21.  Pt and Dtr have now decided on Rehab [place]; not Hospice services/support per chart notes/MD.      SLP Plan  Continue with current plan of care      Recommendations  for follow up therapy are one component of a multi-disciplinary discharge planning process, led  by the attending physician.  Recommendations may be updated based on patient status, additional functional criteria and insurance authorization.    Recommendations  Diet recommendations: Dysphagia 2 (fine chop);Nectar-thick liquid Liquids provided via: Teaspoon;Cup (single, small sips) Medication Administration: Crushed with puree (is recommended but if able to manage Whole in Puree, then that is ok) Supervision: Patient able to self feed;Intermittent supervision to cue for compensatory strategies (setup too) Compensations: Minimize environmental distractions;Slow rate;Small sips/bites;Lingual sweep for clearance of pocketing;Multiple dry swallows after each bite/sip;Follow solids with liquid Postural Changes and/or Swallow Maneuvers: Out of bed for meals;Seated upright 90 degrees;Upright 30-60 min after meal                General recommendations:  (Dietician f/u; Palliative Care f/u for [place] ongoing) Oral Care Recommendations: Oral care BID;Oral care before and after PO;Staff/trained caregiver to provide oral care Follow up Recommendations: Skilled Nursing facility;24 hour supervision/assistance SLP Visit Diagnosis: Dysphagia, oropharyngeal phase (R13.12) (lengthy illness; unsure of Cognition) Plan: Continue with current plan of care       Desiree Mitchell, Desiree Mitchell, Desiree Mitchell Pathologist Rehab Services [phone] [facility]  06/14/2021, 11:31 AM

## 2021-06-14 NOTE — TOC Progression Note (Signed)
Transition of Care [facility]) - Progression Note    Patient Details  Name: Desiree Mitchell MRN: [id number] Date of Birth: Jul 12, 1956  [facility] of Care [facility]) CM/SW Contact  Shelbie Hutching, RN Phone Number: 06/14/2021, 10:38 AM  Clinical Narrative:    Daughter chooses [facility]  [place] does not have a bed available today.    Expected Discharge Plan: [place] Barriers to Discharge: No SNF bed  Expected Discharge Plan and Services Expected Discharge Plan: [place] In-house Referral: Clinical Social Work Discharge Planning Services: CM Consult Post Acute Care Choice: [place] arrangements for the past 2 months: Single Family Home                 DME Arranged: N/A DME Agency: NA       HH Arranged: NA HH Agency: NA         Social Determinants of Health (SDOH) Interventions    Readmission Risk Interventions No flowsheet data found.

## 2021-06-15 DIAGNOSIS — R6520 Severe sepsis without septic shock: Secondary | ICD-10-CM | POA: Diagnosis not present

## 2021-06-15 DIAGNOSIS — J9601 Acute respiratory failure with hypoxia: Secondary | ICD-10-CM | POA: Diagnosis not present

## 2021-06-15 DIAGNOSIS — A419 Sepsis, unspecified organism: Secondary | ICD-10-CM | POA: Diagnosis not present

## 2021-06-15 MED ORDER — CHLORHEXIDINE GLUCONATE CLOTH 2 % EX PADS
6.0000 | MEDICATED_PAD | Freq: Every day | CUTANEOUS | Status: DC
  Administered 2021-06-15 – 2021-06-29 (×15): 6 via TOPICAL

## 2021-06-15 NOTE — Progress Notes (Signed)
Occupational Therapy Treatment Patient Details Name: Desiree [facility] MRN: [id number] DOB: [birth year] Today's Date: 06/15/2021   History of present illness Pt is a 65 y.o. female presenting to hospital 05/21/21 with AMS (found unresponsive by family).  Significant pressure sore present back of neck, buttocks, and coccyx.  Pt noted to be infested with bed bugs and roaches.  Pt admitted with acute respiratory failure, rhabdomyolysis, metabolic acidosis, acute kidney injury, sepsis, VA shunt malfunction, transaminitis, and posterior neck/buttocks/coccyx pressure injuries.  S/p removal ventriculoatrial shunt and placement of L parietal approach ventriculostomy 05/22/21; extubated 10/11; MRI brain 10/14 showing few scattered small acute infarcts involving different vascular territories (watershed type infarcts); TEE 10/18; and EVD removed 10/19.    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   OT comments  Upon entering the room, pt supine in bed attempting to eat her lunch. Pt needing total A for repositioning to safety. Pt needing assistance to open containers and pt able to feed self with min cuing for small bits and sips. Pt repositioned in bed and rolled to the L side to change positions. Pt remained in bed with all needs within reach. Bed alarm activated and pt with all needs within reach.    Recommendations for follow up therapy are one component of a multi-disciplinary discharge planning process, led by the attending physician.  Recommendations may be updated based on patient status, additional functional criteria and insurance authorization.    Follow Up Recommendations  Skilled nursing-short term rehab (<3 hours/day)    Assistance Recommended at Discharge Frequent or constant Supervision/Assistance  Equipment Recommendations  Other (comment) (defer to next venue of care)       Precautions / Restrictions  Precautions Precautions: Fall Precaution Comments: Aspiration; gastric tube; R chest port; posterior neck, buttocks, and coccyx pressure injuries       Mobility Bed Mobility Overal bed mobility: Needs Assistance Bed Mobility: Rolling Rolling: Mod assist                     ADL either performed or assessed with clinical judgement   ADL Overall ADL's : Needs assistance/impaired Eating/Feeding: Set up;Cueing for safety Eating/Feeding Details (indicate cue type and reason): cuing to maintain swallowing precautionsi Grooming: Wash/dry face;Set up                                       Vision Patient Visual Report: No change from baseline            Cognition Arousal/Alertness: Awake/alert Behavior During Therapy: Flat affect Overall Cognitive Status: No family/caregiver present to determine baseline cognitive functioning                                 General Comments: Pt is oriented to self with increased time needed for processing for sequencing and to answer questions.                     Pertinent Vitals/ Pain       Pain Assessment: No/denies pain         Frequency  Min 2X/week        Progress Toward Goals  OT Goals(current goals can now be found in the care plan section)  Progress towards OT goals: Progressing toward goals  Acute Rehab OT Goals Patient Stated Goal: to go home OT Goal Formulation: With patient Time For Goal Achievement: 06/25/21 Potential to Achieve Goals: [place] Discharge plan remains appropriate;Frequency remains appropriate          End of Session    OT Visit Diagnosis: Muscle weakness (generalized) (M62.81);Adult, failure to thrive (R62.7);Other symptoms and signs involving cognitive function   Activity Tolerance Patient tolerated treatment well   Patient Left in bed;with call bell/phone within reach;with bed alarm set   Nurse Communication Other (comment) (RA)         Time: 2536-6440 OT Time Calculation (min): 23 min  Charges: OT General Charges $OT Visit: 1 Visit OT Treatments $Self Care/Home Management : 23-37 mins  Darleen Crocker, MS, OTR/L , CBIS ascom [phone]  06/15/21, 4:17 PM

## 2021-06-15 NOTE — Progress Notes (Signed)
PROGRESS NOTE    Desiree Mitchell  MBW:[id number] DOB: 1956/06/11 DOA: 05/21/2021 PCP: Pcp, No   Chief Complain: Found unresponsive  Brief Narrative: Patient is a 65 year old female with history of breast cancer status post bilateral mastectomy, history of VA shunt  and prior subarachnoid hemorrhage/hydrocephalus, CVA with residual right-sided deficit, asthma, hypertension who presented at emergency department after she was found not responsive by family.  On presentation, she was hypoxic, hypotensive.  She was also found to be infested with bedbugs and cockroaches.  CT head without contrast showed development of mild hydrocephalus, periventricular edema associated with malpositioned left VA shunt.  She was seen by neurosurgery and was adequately taken to the OR for removal of her VA shunt and placement of external ventricular drain.  Patient was transferred to hospital service on 05/30/2021.  Several discussion was held with family about goals of care.  Initially she was on comfort care but since her mental status slowly improved, family wanted to discontinue comfort care, family decided on placing her in a skilled nursing facility.  Case manager following.  Hospice also following and recommending hospice support on long-term care.  Assessment & Plan:   Principal Problem:   Sepsis ([place]) Active Problems:   Endotracheally intubated   On mechanically assisted ventilation (HCC)   Shunt malfunction   Rhabdomyolysis   AKI (acute kidney injury) ([place])   Increased anion gap metabolic acidosis   Acute encephalopathy   Transaminitis   Pressure injury of skin   Acute embolic stroke ([place])   Palliative care by specialist   DNR (do not resuscitate)   Severe sepsis/Proteus/staph epidermidis septicemia: ID was following.  Etiology was suspected to be from chronic wounds.  No evidence of recent infection with negative CSF cultures.  TTE unrevealing.  Unable to perform TEE due to hemodynamic abnormalities  of her epiglottis.  Completed 10 days course of ceftriaxone and for the source of linezolid.  ID signed off.  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  CT head showed development of periventricular edema associated with malpositioned left VA shunt.  Emergently taken to the OR on 10/11 with complete removal of ventriculoperitoneal shunt and placement of left parietal approach ventriculostomy.  External ventriculostomy drain removed by neurosurgery on 05/30/21.  CSF cultures negative.  No further intervention planned by neurosurgery team.  Acute hypoxic respiratory failure: Resolved.  Patient remains on mechanical ventilation following some removal and EVD placement.  Currently on room air  Multifocal strokes: As per MRI which showed multifocal infarcts, concerning for embolic in nature.  Neurology was consulted.  TTE unrevealing.  Unable to undergo TEE.  She has history of right-sided residual deficits  Hypertension: Currently mildly hypertensive.  Continue current medications, monitor blood pressure  Low normal vitamin B12: Received 3 days of IM vitamin B12 supplementation.  Continue oral supplementation.  Dysphagia: Had poor oral intake.  She declined PEG tube placement.  NG tube has been removed, tube feeding discontinued.  Continue comfort feeds.  Speech therapy following.  On dysphagia 2 diet.  GERD: Continue PPI  Normocytic anemia: Continue to monitor H&H.  She was transfused with a unit of PRBC on 11/2.  Hemoglobin today stable.  Goals of care: Palliative care following.  Initially she was on comfort care which has been discontinued because her oral status improved.  Case manager is following for placing her on skilled nursing facility.  Palliative care recommending hospice at the skilled nursing facility  Pressure Injury  05/22/21 Buttocks Right Deep Tissue Pressure Injury - Purple or maroon localized  area of discolored intact skin or blood-filled blister due to damage of underlying soft tissue from pressure and/or shear. (Active)  05/22/21 0300  Location: Buttocks  Location Orientation: Right  Staging: Deep Tissue Pressure Injury - Purple or maroon localized area of discolored intact skin or blood-filled blister due to damage of underlying soft tissue from pressure and/or shear.  Wound Description (Comments):   Present on Admission: Yes     Pressure Injury 05/22/21 Coccyx Stage 2 -  Partial thickness loss of dermis presenting as a shallow open injury with a red, pink wound bed without slough. (Active)  05/22/21 0300  Location: Coccyx  Location Orientation:   Staging: Stage 2 -  Partial thickness loss of dermis presenting as a shallow open injury with a red, pink wound bed without slough.  Wound Description (Comments):   Present on Admission: Yes     Pressure Injury 05/22/21 Cervical Deep Tissue Pressure Injury - Purple or maroon localized area of discolored intact skin or blood-filled blister due to damage of underlying soft tissue from pressure and/or shear. (Active)  05/22/21 0300  Location: Cervical  Location Orientation:   Staging: Deep Tissue Pressure Injury - Purple or maroon localized area of discolored intact skin or blood-filled blister due to damage of underlying soft tissue from pressure and/or shear.  Wound Description (Comments):   Present on Admission: Yes     Pressure Injury 06/05/21 Buttocks Left Unstageable - Full thickness tissue loss in which the base of the injury is covered by slough (yellow, tan, gray, green or brown) and/or eschar (tan, brown or black) in the wound bed. Long curved wound on whole left (Active)  06/05/21 0622  Location: Buttocks  Location Orientation: Left  Staging: Unstageable - Full thickness tissue loss in which the base of the injury is covered by slough (yellow, tan, gray, green or brown) and/or eschar (tan, brown or black) in the wound bed.   Wound Description (Comments): Long curved wound on whole left buttocks. black yellow red pink and bloody  Present on Admission:             Nutrition Problem: Inadequate oral intake Etiology: acute illness      DVT prophylaxis:SCD Code Status: DNR Family Communication: Called daughter for update on 06/15/21,call not received  Status is: Inpatient      Consultants: Neurosurgery, PCCM, palliative care  Procedures: As above  Antimicrobials:  Anti-infectives (From admission, onward)    Start     Dose/Rate Route Frequency Ordered Stop   05/30/21 2200  linezolid (ZYVOX) tablet 600 mg        600 mg Per Tube Every 12 hours 05/30/21 1512 06/06/21 2105   05/24/21 1800  linezolid (ZYVOX) IVPB 600 mg  Status:  Discontinued        600 mg 300 mL/hr over 60 Minutes Intravenous Every 12 hours 05/24/21 1542 05/30/21 1512   05/24/21 1000  cefTRIAXone (ROCEPHIN) 2 g in sodium chloride 0.9 % 100 mL IVPB        2 g 200 mL/hr over 30 Minutes Intravenous Every 24 hours 05/23/21 1510 05/31/21 0926   05/24/21 0000  vancomycin (VANCOCIN) IVPB 1000 mg/200 mL premix  Status:  Discontinued        1,000 mg 200 mL/hr over 60 Minutes Intravenous Every 48 hours 05/22/21 0303 05/23/21 1510   05/23/21 1000  ceFEPIme (MAXIPIME) 2 g in sodium chloride 0.9 % 100 mL IVPB  2 g 200 mL/hr over 30 Minutes Intravenous Every 12 hours 05/23/21 0937 05/23/21 2135   05/22/21 2100  ceFEPIme (MAXIPIME) 2 g in sodium chloride 0.9 % 100 mL IVPB  Status:  Discontinued        2 g 200 mL/hr over 30 Minutes Intravenous Every 24 hours 05/22/21 0304 05/23/21 0937   05/21/21 2100  vancomycin (VANCOREADY) IVPB 2000 mg/400 mL        2,000 mg 200 mL/hr over 120 Minutes Intravenous  Once 05/21/21 2029 05/22/21 0027   05/21/21 2030  ceFEPIme (MAXIPIME) 2 g in sodium chloride 0.9 % 100 mL IVPB        2 g 200 mL/hr over 30 Minutes Intravenous  Once 05/21/21 2023 05/21/21 2210   05/21/21 2030  metroNIDAZOLE (FLAGYL)  IVPB 500 mg        500 mg 100 mL/hr over 60 Minutes Intravenous  Once 05/21/21 2023 05/21/21 2315   05/21/21 2030  vancomycin (VANCOCIN) IVPB 1000 mg/200 mL premix  Status:  Discontinued        1,000 mg 200 mL/hr over 60 Minutes Intravenous  Once 05/21/21 2023 05/21/21 2029       Subjective:  Patient seen and examined at the bedside this morning.  Looks comfortable, not in any acute distress, lying on the bed.  She was ready to eat her breakfast.  She says she wants to go home.  Denies any new complaints  Objective: Vitals:   06/15/21 0036 06/15/21 0436 06/15/21 0827 06/15/21 1124  BP: (!) 149/76 (!) 159/77 135/69 122/72  Pulse: 91 80 90 79  Resp: 19 19 18 18   Temp: 98 F (36.7 C) 98 F (36.7 C) 99.7 F (37.6 C) 97.9 F (36.6 C)  TempSrc:   Axillary Oral  SpO2: 95% 97% 97% 96%  Weight:      Height:        Intake/Output Summary (Last 24 hours) at 06/15/2021 1130 Last data filed at 06/15/2021 0035 Gross per 24 hour  Intake 237 ml  Output 850 ml  Net -613 ml   Filed Weights   06/06/21 0500 06/07/21 0500 06/08/21 0500  Weight: 88.8 kg 88.4 kg 87.9 kg    Examination:   General exam: Overall comfortable, not in distress, obese, chronically looking HEENT: PERRL Respiratory system:  no wheezes or crackles  Cardiovascular system: S1 & S2 heard, RRR.  Gastrointestinal system: Abdomen is nondistended, soft and nontender. Central nervous system: Alert and awake, follows commands, right hemiparesis extremities: Bilateral trace lower extreme edema, lymphedema of left upper extremity Skin: Pressure ulcers as above GU: Foley    Data Reviewed: I have personally reviewed following labs and imaging studies  CBC: Recent Labs  Lab 06/13/21 0640 06/13/21 1110  WBC 9.9  --   HGB 6.7* 10.6*  HCT 21.2* 33.3*  MCV 101.0*  --   PLT 340  --    Basic Metabolic Panel: Recent Labs  Lab 06/13/21 0640  NA 142  K 4.1  CL 107  CO2 29  GLUCOSE 124*  BUN 25*  CREATININE 0.93   CALCIUM 8.1*  MG 2.1   GFR: Estimated Creatinine Clearance: 63.7 mL/min (by C-G formula based on SCr of 0.93 mg/dL). Liver Function Tests: Recent Labs  Lab 06/13/21 0640  AST 19  ALT 13  ALKPHOS 63  BILITOT 0.6  PROT 6.3*  ALBUMIN 2.5*   No results for input(s): LIPASE, AMYLASE in the last 168 hours. No results for input(s): AMMONIA in the last 168  hours. Coagulation Profile: No results for input(s): INR, PROTIME in the last 168 hours. Cardiac Enzymes: No results for input(s): CKTOTAL, CKMB, CKMBINDEX, TROPONINI in the last 168 hours. BNP (last 3 results) No results for input(s): PROBNP in the last 8760 hours. HbA1C: No results for input(s): HGBA1C in the last 72 hours. CBG: Recent Labs  Lab 06/08/21 1623  GLUCAP 83   Lipid Profile: No results for input(s): CHOL, HDL, LDLCALC, TRIG, CHOLHDL, LDLDIRECT in the last 72 hours. Thyroid Function Tests: No results for input(s): TSH, T4TOTAL, FREET4, T3FREE, THYROIDAB in the last 72 hours. Anemia Panel: No results for input(s): VITAMINB12, FOLATE, FERRITIN, TIBC, IRON, RETICCTPCT in the last 72 hours. Sepsis Labs: No results for input(s): PROCALCITON, LATICACIDVEN in the last 168 hours.  No results found for this or any previous visit (from the past 240 hour(s)).       Radiology Studies: No results found.      Scheduled Meds:  aspirin EC  81 mg Oral Daily   collagenase   Topical Daily   feeding supplement (NEPRO CARB STEADY)  237 mL Oral TID BM   Gerhardt's butt cream   Topical TID   metoprolol tartrate  25 mg Oral BID   multivitamin with minerals  1 tablet Oral Daily   pantoprazole  40 mg Oral Daily   vitamin B-12  1,000 mcg Oral Daily   Continuous Infusions:   LOS: 24 days    Time spent:35 mins. More than 50% of that time was spent in counseling and/or coordination of care.      Shelly Coss, MD [facility] P11/11/2020, 11:30 AM

## 2021-06-15 NOTE — Care Management Important Message (Signed)
Important Message  Patient Details  Name: Desiree Mitchell MRN: [id number] Date of Birth: [birth year]   Medicare Important Message Given:  Other (see comment)  Tried contacting the patient's daughter, Desiree Mitchell ([phone]) to review the Important Message from [facility] but her mailbox was full so I could not leave a message. She wants mother to go SNF. Will try again on Monday,  [name] 06/15/2021, 1:25 PM

## 2021-06-15 NOTE — TOC Progression Note (Signed)
Transition of Care [facility]) - Progression Note    Patient Details  Name: Desiree Mitchell MRN: [id number] Date of Birth: 04-21-1956  Transition of Care [facility]) CM/SW Contact  Shelbie Hutching, RN Phone Number: 06/15/2021, 2:26 PM  Clinical Narrative:    [facility] was unable to provide a bed for rehab but [facility] in [place] has offered a bed and daughter accepts bed offer.  Debbi at [place] will start insurance authorization.     Expected Discharge Plan: [place] Barriers to Discharge: No SNF bed  Expected Discharge Plan and Services Expected Discharge Plan: [place] In-house Referral: Clinical Social Work Discharge Planning Services: CM Consult Post Acute Care Choice: [place] arrangements for the past 2 months: Single Family Home                 DME Arranged: N/A DME Agency: NA       HH Arranged: NA HH Agency: NA         Social Determinants of Health (SDOH) Interventions    Readmission Risk Interventions No flowsheet data found.

## 2021-06-16 DIAGNOSIS — R6520 Severe sepsis without septic shock: Secondary | ICD-10-CM | POA: Diagnosis not present

## 2021-06-16 DIAGNOSIS — J9601 Acute respiratory failure with hypoxia: Secondary | ICD-10-CM | POA: Diagnosis not present

## 2021-06-16 DIAGNOSIS — A419 Sepsis, unspecified organism: Secondary | ICD-10-CM | POA: Diagnosis not present

## 2021-06-16 LAB — CBC WITH DIFFERENTIAL/PLATELET
Abs Immature Granulocytes: 0.11 10*3/uL — ABNORMAL HIGH (ref 0.00–0.07)
Basophils Absolute: 0.1 10*3/uL (ref 0.0–0.1)
Basophils Relative: 1 %
Eosinophils Absolute: 0.6 10*3/uL — ABNORMAL HIGH (ref 0.0–0.5)
Eosinophils Relative: 5 %
HCT: 33.8 % — ABNORMAL LOW (ref 36.0–46.0)
Hemoglobin: 10.5 g/dL — ABNORMAL LOW (ref 12.0–15.0)
Immature Granulocytes: 1 %
Lymphocytes Relative: 19 %
Lymphs Abs: 2.4 10*3/uL (ref 0.7–4.0)
MCH: 31.4 pg (ref 26.0–34.0)
MCHC: 31.1 g/dL (ref 30.0–36.0)
MCV: 101.2 fL — ABNORMAL HIGH (ref 80.0–100.0)
Monocytes Absolute: 1.1 10*3/uL — ABNORMAL HIGH (ref 0.1–1.0)
Monocytes Relative: 9 %
Neutro Abs: 8.4 10*3/uL — ABNORMAL HIGH (ref 1.7–7.7)
Neutrophils Relative %: 65 %
Platelets: 641 10*3/uL — ABNORMAL HIGH (ref 150–400)
RBC: 3.34 MIL/uL — ABNORMAL LOW (ref 3.87–5.11)
RDW: 14.6 % (ref 11.5–15.5)
WBC: 12.7 10*3/uL — ABNORMAL HIGH (ref 4.0–10.5)
nRBC: 0 % (ref 0.0–0.2)

## 2021-06-16 LAB — BASIC METABOLIC PANEL
Anion gap: 7 (ref 5–15)
BUN: 25 mg/dL — ABNORMAL HIGH (ref 8–23)
CO2: 28 mmol/L (ref 22–32)
Calcium: 8.2 mg/dL — ABNORMAL LOW (ref 8.9–10.3)
Chloride: 109 mmol/L (ref 98–111)
Creatinine, Ser: 0.84 mg/dL (ref 0.44–1.00)
GFR, Estimated: >60 mL/min (ref >60)
Glucose, Bld: 108 mg/dL — ABNORMAL HIGH (ref 70–99)
Potassium: 3.8 mmol/L (ref 3.5–5.1)
Sodium: 144 mmol/L (ref 135–145)

## 2021-06-16 NOTE — Plan of Care (Signed)
Patient oriented to self and hospital, otherwise minimal verbal communication. VSS, on room air. Foley in place, CHG care provided. Port needle changed overnight. Dressing changed over b/l buttocks wounds per orders, q2hr turns maintained for offloading. Fall/safety precautions in place, rounding performed, needs/concerns addressed during shift.   Problem: Education: Goal: Knowledge of General Education information will improve Description: Including pain rating scale, medication(s)/side effects and non-pharmacologic comfort measures Outcome: Progressing   Problem: Health Behavior/Discharge Planning: Goal: Ability to manage health-related needs will improve Outcome: Progressing   Problem: Clinical Measurements: Goal: Ability to maintain clinical measurements within normal limits will improve Outcome: Progressing Goal: Will remain free from infection Outcome: Progressing Goal: Diagnostic test results will improve Outcome: Progressing Goal: Respiratory complications will improve Outcome: Progressing Goal: Cardiovascular complication will be avoided Outcome: Progressing   Problem: Activity: Goal: Risk for activity intolerance will decrease Outcome: Progressing   Problem: Nutrition: Goal: Adequate nutrition will be maintained Outcome: Progressing   Problem: Coping: Goal: Level of anxiety will decrease Outcome: Progressing   Problem: Elimination: Goal: Will not experience complications related to bowel motility Outcome: Progressing Goal: Will not experience complications related to urinary retention Outcome: Progressing   Problem: Pain Managment: Goal: General experience of comfort will improve Outcome: Progressing   Problem: Safety: Goal: Ability to remain free from injury will improve Outcome: Progressing   Problem: Skin Integrity: Goal: Risk for impaired skin integrity will decrease Outcome: Progressing

## 2021-06-16 NOTE — Progress Notes (Signed)
PROGRESS NOTE    Carlisia Mitchell  JQB:[id number] DOB: Jan 27, 1956 DOA: 05/21/2021 PCP: Pcp, No   Chief Complain: Found unresponsive  Brief Narrative: Patient is a 65 year old female with history of breast cancer status post bilateral mastectomy, history of VA shunt  and prior subarachnoid hemorrhage/hydrocephalus, CVA with residual right-sided deficit, asthma, hypertension who presented at emergency department after she was found not responsive by family.  On presentation, she was hypoxic, hypotensive.  She was also found to be infested with bedbugs and cockroaches.  CT head without contrast showed development of mild hydrocephalus, periventricular edema associated with malpositioned left VA shunt.  She was seen by neurosurgery and was adequately taken to the OR for removal of her VA shunt and placement of external ventricular drain.  Patient was transferred to hospital service on 05/30/2021.  Several discussion was held with family about goals of care.  Initially she was on comfort care but since her mental status slowly improved, family wanted to discontinue comfort care, family decided on placing her in a skilled nursing facility.  Case manager following.  Hospice also following and recommending hospice support on long-term care.  Assessment & Plan:   Principal Problem:   Sepsis ([place]) Active Problems:   Endotracheally intubated   On mechanically assisted ventilation (HCC)   Shunt malfunction   Rhabdomyolysis   AKI (acute kidney injury) ([place])   Increased anion gap metabolic acidosis   Acute encephalopathy   Transaminitis   Pressure injury of skin   Acute embolic stroke ([place])   Palliative care by specialist   DNR (do not resuscitate)   Severe sepsis/Proteus/staph epidermidis septicemia: ID was following.  Etiology was suspected to be from chronic wounds.  No evidence of recent infection with negative CSF cultures.  TTE unrevealing.  Unable to perform TEE due to hemodynamic abnormalities  of her epiglottis.  Completed 10 days course of ceftriaxone and for the source of linezolid.  ID signed off.  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  CT head showed development of periventricular edema associated with malpositioned left VA shunt.  Emergently taken to the OR on 10/11 with complete removal of ventriculoperitoneal shunt and placement of left parietal approach ventriculostomy.  External ventriculostomy drain removed by neurosurgery on 05/30/21.  CSF cultures negative.  No further intervention planned by neurosurgery team.  Acute hypoxic respiratory failure: Resolved.  Patient remains on mechanical ventilation following some removal and EVD placement.  Currently on room air  Multifocal strokes: As per MRI which showed multifocal infarcts, concerning for embolic in nature.  Neurology was consulted.  TTE unrevealing.  Unable to undergo TEE.  She has history of right-sided residual deficits  Hypertension: Currently mildly hypertensive.  Continue current medications, monitor blood pressure  Low normal vitamin B12: Received 3 days of IM vitamin B12 supplementation.  Continue oral supplementation.  Dysphagia: Had poor oral intake.  She declined PEG tube placement.  NG tube has been removed, tube feeding discontinued.  Continue comfort feeds.  Speech therapy following.  On dysphagia 2 diet.  GERD: Continue PPI  Normocytic anemia: Continue to monitor H&H.  She was transfused with a unit of PRBC on 11/2.  Hemoglobin today stable.  Goals of care: Palliative care following.  Initially she was on comfort care which has been discontinued because her oral status improved.  Case manager is following for placing her on skilled nursing facility.  Palliative care recommending hospice at the skilled nursing facility  Pressure Injury  05/22/21 Buttocks Right Deep Tissue Pressure Injury - Purple or maroon localized  area of discolored intact skin or blood-filled blister due to damage of underlying soft tissue from pressure and/or shear. (Active)  05/22/21 0300  Location: Buttocks  Location Orientation: Right  Staging: Deep Tissue Pressure Injury - Purple or maroon localized area of discolored intact skin or blood-filled blister due to damage of underlying soft tissue from pressure and/or shear.  Wound Description (Comments):   Present on Admission: Yes     Pressure Injury 05/22/21 Coccyx Stage 2 -  Partial thickness loss of dermis presenting as a shallow open injury with a red, pink wound bed without slough. (Active)  05/22/21 0300  Location: Coccyx  Location Orientation:   Staging: Stage 2 -  Partial thickness loss of dermis presenting as a shallow open injury with a red, pink wound bed without slough.  Wound Description (Comments):   Present on Admission: Yes     Pressure Injury 05/22/21 Cervical Deep Tissue Pressure Injury - Purple or maroon localized area of discolored intact skin or blood-filled blister due to damage of underlying soft tissue from pressure and/or shear. (Active)  05/22/21 0300  Location: Cervical  Location Orientation:   Staging: Deep Tissue Pressure Injury - Purple or maroon localized area of discolored intact skin or blood-filled blister due to damage of underlying soft tissue from pressure and/or shear.  Wound Description (Comments):   Present on Admission: Yes     Pressure Injury 06/05/21 Buttocks Left Unstageable - Full thickness tissue loss in which the base of the injury is covered by slough (yellow, tan, gray, green or brown) and/or eschar (tan, brown or black) in the wound bed. Long curved wound on whole left (Active)  06/05/21 0622  Location: Buttocks  Location Orientation: Left  Staging: Unstageable - Full thickness tissue loss in which the base of the injury is covered by slough (yellow, tan, gray, green or brown) and/or eschar (tan, brown or black) in the wound bed.   Wound Description (Comments): Long curved wound on whole left buttocks. black yellow red pink and bloody  Present on Admission:             Nutrition Problem: Inadequate oral intake Etiology: acute illness      DVT prophylaxis:SCD Code Status: DNR Family Communication: Called daughter for update on 06/15/21,call not received  Status is: Inpatient      Consultants: Neurosurgery, PCCM, palliative care  Procedures: As above  Antimicrobials:  Anti-infectives (From admission, onward)    Start     Dose/Rate Route Frequency Ordered Stop   05/30/21 2200  linezolid (ZYVOX) tablet 600 mg        600 mg Per Tube Every 12 hours 05/30/21 1512 06/06/21 2105   05/24/21 1800  linezolid (ZYVOX) IVPB 600 mg  Status:  Discontinued        600 mg 300 mL/hr over 60 Minutes Intravenous Every 12 hours 05/24/21 1542 05/30/21 1512   05/24/21 1000  cefTRIAXone (ROCEPHIN) 2 g in sodium chloride 0.9 % 100 mL IVPB        2 g 200 mL/hr over 30 Minutes Intravenous Every 24 hours 05/23/21 1510 05/31/21 0926   05/24/21 0000  vancomycin (VANCOCIN) IVPB 1000 mg/200 mL premix  Status:  Discontinued        1,000 mg 200 mL/hr over 60 Minutes Intravenous Every 48 hours 05/22/21 0303 05/23/21 1510   05/23/21 1000  ceFEPIme (MAXIPIME) 2 g in sodium chloride 0.9 % 100 mL IVPB  2 g 200 mL/hr over 30 Minutes Intravenous Every 12 hours 05/23/21 0937 05/23/21 2135   05/22/21 2100  ceFEPIme (MAXIPIME) 2 g in sodium chloride 0.9 % 100 mL IVPB  Status:  Discontinued        2 g 200 mL/hr over 30 Minutes Intravenous Every 24 hours 05/22/21 0304 05/23/21 0937   05/21/21 2100  vancomycin (VANCOREADY) IVPB 2000 mg/400 mL        2,000 mg 200 mL/hr over 120 Minutes Intravenous  Once 05/21/21 2029 05/22/21 0027   05/21/21 2030  ceFEPIme (MAXIPIME) 2 g in sodium chloride 0.9 % 100 mL IVPB        2 g 200 mL/hr over 30 Minutes Intravenous  Once 05/21/21 2023 05/21/21 2210   05/21/21 2030  metroNIDAZOLE (FLAGYL)  IVPB 500 mg        500 mg 100 mL/hr over 60 Minutes Intravenous  Once 05/21/21 2023 05/21/21 2315   05/21/21 2030  vancomycin (VANCOCIN) IVPB 1000 mg/200 mL premix  Status:  Discontinued        1,000 mg 200 mL/hr over 60 Minutes Intravenous  Once 05/21/21 2023 05/21/21 2029       Subjective:  Patient seen and examined at the bedside this morning.  Hemodynamically stable.  Lying on the bed, awake, not in distress.  She looked but did not talk to me when asked how she is doing.  Objective: Vitals:   06/15/21 1124 06/15/21 1553 06/15/21 1945 06/16/21 0433  BP: 122/72 130/77 (!) 178/89 (!) 152/80  Pulse: 79 84 98 96  Resp: 18 16 18 19   Temp: 97.9 F (36.6 C) 99.7 F (37.6 C) 98.6 F (37 C) 99.5 F (37.5 C)  TempSrc: Oral  Oral   SpO2: 96% 96% 95% 96%  Weight:      Height:        Intake/Output Summary (Last 24 hours) at 06/16/2021 0742 Last data filed at 06/15/2021 1945 Gross per 24 hour  Intake --  Output 175 ml  Net -175 ml   Filed Weights   06/06/21 0500 06/07/21 0500 06/08/21 0500  Weight: 88.8 kg 88.4 kg 87.9 kg    Examination:   General exam: Overall comfortable, not in distress,obese, chronically ill looking Respiratory system:  no wheezes or crackles  Cardiovascular system: S1 & S2 heard, RRR.  Gastrointestinal system: Abdomen is nondistended, soft and nontender. Central nervous system: Alert and awake, follows commands, right hemiparesis, not oriented Extremities: Bilateral trace lower extremity, lymphedema of left upper extremity Skin: Pressure ulcers as above GU: Foley   Data Reviewed: I have personally reviewed following labs and imaging studies  CBC: Recent Labs  Lab 06/13/21 0640 06/13/21 1110 06/16/21 0455  WBC 9.9  --  12.7*  NEUTROABS  --   --  8.4*  HGB 6.7* 10.6* 10.5*  HCT 21.2* 33.3* 33.8*  MCV 101.0*  --  101.2*  PLT 340  --  630*   Basic Metabolic Panel: Recent Labs  Lab 06/13/21 0640 06/16/21 0455  NA 142 144  K 4.1 3.8   CL 107 109  CO2 29 28  GLUCOSE 124* 108*  BUN 25* 25*  CREATININE 0.93 0.84  CALCIUM 8.1* 8.2*  MG 2.1  --    GFR: Estimated Creatinine Clearance: 70.6 mL/min (by C-G formula based on SCr of 0.84 mg/dL). Liver Function Tests: Recent Labs  Lab 06/13/21 0640  AST 19  ALT 13  ALKPHOS 63  BILITOT 0.6  PROT 6.3*  ALBUMIN 2.5*  No results for input(s): LIPASE, AMYLASE in the last 168 hours. No results for input(s): AMMONIA in the last 168 hours. Coagulation Profile: No results for input(s): INR, PROTIME in the last 168 hours. Cardiac Enzymes: No results for input(s): CKTOTAL, CKMB, CKMBINDEX, TROPONINI in the last 168 hours. BNP (last 3 results) No results for input(s): PROBNP in the last 8760 hours. HbA1C: No results for input(s): HGBA1C in the last 72 hours. CBG: No results for input(s): GLUCAP in the last 168 hours.  Lipid Profile: No results for input(s): CHOL, HDL, LDLCALC, TRIG, CHOLHDL, LDLDIRECT in the last 72 hours. Thyroid Function Tests: No results for input(s): TSH, T4TOTAL, FREET4, T3FREE, THYROIDAB in the last 72 hours. Anemia Panel: No results for input(s): VITAMINB12, FOLATE, FERRITIN, TIBC, IRON, RETICCTPCT in the last 72 hours. Sepsis Labs: No results for input(s): PROCALCITON, LATICACIDVEN in the last 168 hours.  No results found for this or any previous visit (from the past 240 hour(s)).       Radiology Studies: No results found.      Scheduled Meds:  aspirin EC  81 mg Oral Daily   Chlorhexidine Gluconate Cloth  6 each Topical Daily   collagenase   Topical Daily   feeding supplement (NEPRO CARB STEADY)  237 mL Oral TID BM   Gerhardt's butt cream   Topical TID   metoprolol tartrate  25 mg Oral BID   multivitamin with minerals  1 tablet Oral Daily   pantoprazole  40 mg Oral Daily   vitamin B-12  1,000 mcg Oral Daily   Continuous Infusions:   LOS: 25 days    Time spent:35 mins. More than 50% of that time was spent in counseling  and/or coordination of care.      Shelly Coss, MD [facility] P11/12/2020, 7:42 AM

## 2021-06-17 DIAGNOSIS — A419 Sepsis, unspecified organism: Secondary | ICD-10-CM | POA: Diagnosis not present

## 2021-06-17 DIAGNOSIS — J9601 Acute respiratory failure with hypoxia: Secondary | ICD-10-CM | POA: Diagnosis not present

## 2021-06-17 DIAGNOSIS — R6520 Severe sepsis without septic shock: Secondary | ICD-10-CM | POA: Diagnosis not present

## 2021-06-17 NOTE — Progress Notes (Signed)
PROGRESS NOTE    Desiree Mitchell  CLE:[id number] DOB: 1956-08-02 DOA: 05/21/2021 PCP: Pcp, No   Chief Complain: Found unresponsive  Brief Narrative: Patient is a 65 year old female with history of breast cancer status post bilateral mastectomy, history of VA shunt  and prior subarachnoid hemorrhage/hydrocephalus, CVA with residual right-sided deficit, asthma, hypertension who presented at emergency department after she was found not responsive by family.  On presentation, she was hypoxic, hypotensive.  She was also found to be infested with bedbugs and cockroaches.  CT head without contrast showed development of mild hydrocephalus, periventricular edema associated with malpositioned left VA shunt.  She was seen by neurosurgery and was adequately taken to the OR for removal of her VA shunt and placement of external ventricular drain.  Patient was transferred to hospital service on 05/30/2021.  Several discussion was held with family about goals of care.  Initially she was on comfort care but since her mental status slowly improved, family wanted to discontinue comfort care, family decided on placing her in a skilled nursing facility.  Case manager following.  Hospice also following and recommending hospice support on long-term care.  Assessment & Plan:   Principal Problem:   Sepsis ([place]) Active Problems:   Endotracheally intubated   On mechanically assisted ventilation (HCC)   Shunt malfunction   Rhabdomyolysis   AKI (acute kidney injury) ([place])   Increased anion gap metabolic acidosis   Acute encephalopathy   Transaminitis   Pressure injury of skin   Acute embolic stroke ([place])   Palliative care by specialist   DNR (do not resuscitate)   Severe sepsis/Proteus/staph epidermidis septicemia: ID was following.  Etiology was suspected to be from chronic wounds.  No evidence of recent infection with negative CSF cultures.  TTE unrevealing.  Unable to perform TEE due to hemodynamic abnormalities  of her epiglottis.  Completed 10 days course of ceftriaxone and for the source of linezolid.  ID signed off.  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  CT head showed development of periventricular edema associated with malpositioned left VA shunt.  Emergently taken to the OR on 10/11 with complete removal of ventriculoperitoneal shunt and placement of left parietal approach ventriculostomy.  External ventriculostomy drain removed by neurosurgery on 05/30/21.  CSF cultures negative.  No further intervention planned by neurosurgery team.  Acute hypoxic respiratory failure: Resolved.  Patient remains on mechanical ventilation following some removal and EVD placement.  Currently on room air  Multifocal strokes: As per MRI which showed multifocal infarcts, concerning for embolic in nature.  Neurology was consulted.  TTE unrevealing.  Unable to undergo TEE.  She has history of right-sided residual deficits  Hypertension: Currently mildly hypertensive.  Continue current medications, monitor blood pressure  Low normal vitamin B12: Received 3 days of IM vitamin B12 supplementation.  Continue oral supplementation.  Dysphagia: Had poor oral intake.  She declined PEG tube placement.  NG tube has been removed, tube feeding discontinued.  Continue comfort feeds.  Speech therapy following.  On dysphagia 2 diet.  GERD: Continue PPI  Normocytic anemia: Continue to monitor H&H.  She was transfused with a unit of PRBC on 11/2.  Hemoglobin today stable.  Goals of care: Palliative care following.  Initially she was on comfort care which has been discontinued because her oral status improved.  Case manager is following for placing her on skilled nursing facility.  Palliative care recommending hospice at the skilled nursing facility  Pressure Injury  05/22/21 Buttocks Right Deep Tissue Pressure Injury - Purple or maroon localized  area of discolored intact skin or blood-filled blister due to damage of underlying soft tissue from pressure and/or shear. (Active)  05/22/21 0300  Location: Buttocks  Location Orientation: Right  Staging: Deep Tissue Pressure Injury - Purple or maroon localized area of discolored intact skin or blood-filled blister due to damage of underlying soft tissue from pressure and/or shear.  Wound Description (Comments):   Present on Admission: Yes     Pressure Injury 05/22/21 Coccyx Stage 2 -  Partial thickness loss of dermis presenting as a shallow open injury with a red, pink wound bed without slough. (Active)  05/22/21 0300  Location: Coccyx  Location Orientation:   Staging: Stage 2 -  Partial thickness loss of dermis presenting as a shallow open injury with a red, pink wound bed without slough.  Wound Description (Comments):   Present on Admission: Yes     Pressure Injury 05/22/21 Cervical Deep Tissue Pressure Injury - Purple or maroon localized area of discolored intact skin or blood-filled blister due to damage of underlying soft tissue from pressure and/or shear. (Active)  05/22/21 0300  Location: Cervical  Location Orientation:   Staging: Deep Tissue Pressure Injury - Purple or maroon localized area of discolored intact skin or blood-filled blister due to damage of underlying soft tissue from pressure and/or shear.  Wound Description (Comments):   Present on Admission: Yes     Pressure Injury 06/05/21 Buttocks Left Unstageable - Full thickness tissue loss in which the base of the injury is covered by slough (yellow, tan, gray, green or brown) and/or eschar (tan, brown or black) in the wound bed. Long curved wound on whole left (Active)  06/05/21 0622  Location: Buttocks  Location Orientation: Left  Staging: Unstageable - Full thickness tissue loss in which the base of the injury is covered by slough (yellow, tan, gray, green or brown) and/or eschar (tan, brown or black) in the wound bed.   Wound Description (Comments): Long curved wound on whole left buttocks. black yellow red pink and bloody  Present on Admission:             Nutrition Problem: Inadequate oral intake Etiology: acute illness      DVT prophylaxis:SCD Code Status: DNR Family Communication: Called daughter for update on 06/15/21,call not received  Status is: Inpatient      Consultants: Neurosurgery, PCCM, palliative care  Procedures: As above  Antimicrobials:  Anti-infectives (From admission, onward)    Start     Dose/Rate Route Frequency Ordered Stop   05/30/21 2200  linezolid (ZYVOX) tablet 600 mg        600 mg Per Tube Every 12 hours 05/30/21 1512 06/06/21 2105   05/24/21 1800  linezolid (ZYVOX) IVPB 600 mg  Status:  Discontinued        600 mg 300 mL/hr over 60 Minutes Intravenous Every 12 hours 05/24/21 1542 05/30/21 1512   05/24/21 1000  cefTRIAXone (ROCEPHIN) 2 g in sodium chloride 0.9 % 100 mL IVPB        2 g 200 mL/hr over 30 Minutes Intravenous Every 24 hours 05/23/21 1510 05/31/21 0926   05/24/21 0000  vancomycin (VANCOCIN) IVPB 1000 mg/200 mL premix  Status:  Discontinued        1,000 mg 200 mL/hr over 60 Minutes Intravenous Every 48 hours 05/22/21 0303 05/23/21 1510   05/23/21 1000  ceFEPIme (MAXIPIME) 2 g in sodium chloride 0.9 % 100 mL IVPB  2 g 200 mL/hr over 30 Minutes Intravenous Every 12 hours 05/23/21 0937 05/23/21 2135   05/22/21 2100  ceFEPIme (MAXIPIME) 2 g in sodium chloride 0.9 % 100 mL IVPB  Status:  Discontinued        2 g 200 mL/hr over 30 Minutes Intravenous Every 24 hours 05/22/21 0304 05/23/21 0937   05/21/21 2100  vancomycin (VANCOREADY) IVPB 2000 mg/400 mL        2,000 mg 200 mL/hr over 120 Minutes Intravenous  Once 05/21/21 2029 05/22/21 0027   05/21/21 2030  ceFEPIme (MAXIPIME) 2 g in sodium chloride 0.9 % 100 mL IVPB        2 g 200 mL/hr over 30 Minutes Intravenous  Once 05/21/21 2023 05/21/21 2210   05/21/21 2030  metroNIDAZOLE (FLAGYL)  IVPB 500 mg        500 mg 100 mL/hr over 60 Minutes Intravenous  Once 05/21/21 2023 05/21/21 2315   05/21/21 2030  vancomycin (VANCOCIN) IVPB 1000 mg/200 mL premix  Status:  Discontinued        1,000 mg 200 mL/hr over 60 Minutes Intravenous  Once 05/21/21 2023 05/21/21 2029       Subjective:  Patient seen and examined the bedside this morning.  Hemodynamically stable.  She was lying on bed as usual.  Looks weak.  Not in any kind of distress.  Denies any complaints today.  Objective: Vitals:   06/16/21 1739 06/16/21 2045 06/17/21 0055 06/17/21 0600  BP: 121/61 (!) 153/68 (!) 153/60 (!) 156/71  Pulse: 87 (!) 102 81 86  Resp: 16 16  18   Temp: 99.6 F (37.6 C) 98.9 F (37.2 C) 99.3 F (37.4 C) 98.3 F (36.8 C)  TempSrc:  Oral Oral Oral  SpO2: 94% 94% 93% 94%  Weight:      Height:        Intake/Output Summary (Last 24 hours) at 06/17/2021 0727 Last data filed at 06/17/2021 0522 Gross per 24 hour  Intake 1018 ml  Output 400 ml  Net 618 ml   Filed Weights   06/06/21 0500 06/07/21 0500 06/08/21 0500  Weight: 88.8 kg 88.4 kg 87.9 kg    Examination:  General exam: Chronically ill looking, not in distress, obese Respiratory system:  no wheezes or crackles  Cardiovascular system: S1 & S2 heard, RRR.  Gastrointestinal system: Abdomen is nondistended, soft and nontender. Central nervous system: Alert and awake, follows command, right hemiparesis, confused Extremities: Trace bilateral lower extremity edema, no clubbing ,no cyanosis, lymphedema of left upper extremity Skin: Pressure ulcers as above GU: Foley   Data Reviewed: I have personally reviewed following labs and imaging studies  CBC: Recent Labs  Lab 06/13/21 0640 06/13/21 1110 06/16/21 0455  WBC 9.9  --  12.7*  NEUTROABS  --   --  8.4*  HGB 6.7* 10.6* 10.5*  HCT 21.2* 33.3* 33.8*  MCV 101.0*  --  101.2*  PLT 340  --  299*   Basic Metabolic Panel: Recent Labs  Lab 06/13/21 0640 06/16/21 0455  NA 142  144  K 4.1 3.8  CL 107 109  CO2 29 28  GLUCOSE 124* 108*  BUN 25* 25*  CREATININE 0.93 0.84  CALCIUM 8.1* 8.2*  MG 2.1  --    GFR: Estimated Creatinine Clearance: 70.6 mL/min (by C-G formula based on SCr of 0.84 mg/dL). Liver Function Tests: Recent Labs  Lab 06/13/21 0640  AST 19  ALT 13  ALKPHOS 63  BILITOT 0.6  PROT 6.3*  ALBUMIN  2.5*   No results for input(s): LIPASE, AMYLASE in the last 168 hours. No results for input(s): AMMONIA in the last 168 hours. Coagulation Profile: No results for input(s): INR, PROTIME in the last 168 hours. Cardiac Enzymes: No results for input(s): CKTOTAL, CKMB, CKMBINDEX, TROPONINI in the last 168 hours. BNP (last 3 results) No results for input(s): PROBNP in the last 8760 hours. HbA1C: No results for input(s): HGBA1C in the last 72 hours. CBG: No results for input(s): GLUCAP in the last 168 hours.  Lipid Profile: No results for input(s): CHOL, HDL, LDLCALC, TRIG, CHOLHDL, LDLDIRECT in the last 72 hours. Thyroid Function Tests: No results for input(s): TSH, T4TOTAL, FREET4, T3FREE, THYROIDAB in the last 72 hours. Anemia Panel: No results for input(s): VITAMINB12, FOLATE, FERRITIN, TIBC, IRON, RETICCTPCT in the last 72 hours. Sepsis Labs: No results for input(s): PROCALCITON, LATICACIDVEN in the last 168 hours.  No results found for this or any previous visit (from the past 240 hour(s)).       Radiology Studies: No results found.      Scheduled Meds:  aspirin EC  81 mg Oral Daily   Chlorhexidine Gluconate Cloth  6 each Topical Daily   collagenase   Topical Daily   feeding supplement (NEPRO CARB STEADY)  237 mL Oral TID BM   Gerhardt's butt cream   Topical TID   metoprolol tartrate  25 mg Oral BID   multivitamin with minerals  1 tablet Oral Daily   pantoprazole  40 mg Oral Daily   vitamin B-12  1,000 mcg Oral Daily   Continuous Infusions:   LOS: 26 days    Time spent:35 mins. More than 50% of that time was spent  in counseling and/or coordination of care.      Shelly Coss, MD [facility] P11/01/2021, 7:27 AM

## 2021-06-18 DIAGNOSIS — R6520 Severe sepsis without septic shock: Secondary | ICD-10-CM | POA: Diagnosis not present

## 2021-06-18 DIAGNOSIS — A419 Sepsis, unspecified organism: Secondary | ICD-10-CM | POA: Diagnosis not present

## 2021-06-18 DIAGNOSIS — J9601 Acute respiratory failure with hypoxia: Secondary | ICD-10-CM | POA: Diagnosis not present

## 2021-06-18 MED ORDER — AMLODIPINE BESYLATE 5 MG PO TABS
5.0000 mg | ORAL_TABLET | Freq: Every day | ORAL | Status: DC
Start: 2021-06-18 — End: 2021-06-25
  Administered 2021-06-18 – 2021-06-25 (×8): 5 mg via ORAL
  Filled 2021-06-18 (×8): qty 1

## 2021-06-18 MED ORDER — DAKINS (1/4 STRENGTH) 0.125 % EX SOLN: Freq: Two times a day (BID) | CUTANEOUS | Status: DC

## 2021-06-18 MED ORDER — DAKINS (1/4 STRENGTH) 0.125 % EX SOLN
Freq: Two times a day (BID) | CUTANEOUS | Status: AC
  Administered 2021-06-19: 1 via TOPICAL
  Filled 2021-06-18 (×4): qty 473

## 2021-06-18 NOTE — Progress Notes (Addendum)
PROGRESS NOTE    Desiree Mitchell  ZDG:[id number] DOB: [birth year] DOA: 05/21/2021 PCP: Pcp, No   Chief Complain: Found unresponsive  Brief Narrative: Patient is a 65 year old female with history of breast cancer status post bilateral mastectomy, history of VA shunt  and prior subarachnoid hemorrhage/hydrocephalus, CVA with residual right-sided deficit, asthma, hypertension who presented at emergency department after she was found not responsive by family.  On presentation, she was hypoxic, hypotensive.  She was also found to be infested with bedbugs and cockroaches.  CT head without contrast showed development of mild hydrocephalus, periventricular edema associated with malpositioned left VA shunt.  She was seen by neurosurgery and was adequately taken to the OR for removal of her VA shunt and placement of external ventricular drain.  Patient was transferred to hospital service on 05/30/2021.  Several discussion was held with family about goals of care.  Initially she was on comfort care but since her mental status slowly improved, family wanted to discontinue comfort care, family decided on placing her in a skilled nursing facility.  Case manager following.  Hospice also following and recommending hospice support on long-term care.  Assessment & Plan:   Principal Problem:   Sepsis ([place]) Active Problems:   Endotracheally intubated   On mechanically assisted ventilation (HCC)   Shunt malfunction   Rhabdomyolysis   AKI (acute kidney injury) ([place])   Increased anion gap metabolic acidosis   Acute encephalopathy   Transaminitis   Pressure injury of skin   Acute embolic stroke ([place])   Palliative care by specialist   DNR (do not resuscitate)   Severe sepsis/Proteus/staph epidermidis septicemia: ID was following.  Etiology was suspected to be from chronic wounds.  No evidence of recent infection with negative CSF cultures.  TTE unrevealing.  Unable to perform TEE due to hemodynamic abnormalities  of her epiglottis.  Completed 10 days course of ceftriaxone and for the source of linezolid.  ID signed off.  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  CT head showed development of periventricular edema associated with malpositioned left VA shunt.  Emergently taken to the OR on 10/11 with complete removal of ventriculoperitoneal shunt and placement of left parietal approach ventriculostomy.  External ventriculostomy drain removed by neurosurgery on 05/30/21.  CSF cultures negative.  No further intervention planned by neurosurgery team.  Acute hypoxic respiratory failure: Resolved.  Patient remains on mechanical ventilation following some removal and EVD placement.  Currently on room air  Multifocal strokes: As per MRI which showed multifocal infarcts, concerning for embolic in nature.  Neurology was consulted.  TTE unrevealing.  Unable to undergo TEE.  She has history of right-sided residual deficits  Hypertension: Currently mildly hypertensive.  Continue current medications, monitor blood pressure  Low normal vitamin B12: Received 3 days of IM vitamin B12 supplementation.  Continue oral supplementation.  Dysphagia: Had poor oral intake.  She declined PEG tube placement.  NG tube has been removed, tube feeding discontinued.  Continue comfort feeds.  Speech therapy following.  On dysphagia 2 diet.  GERD: Continue PPI  Normocytic anemia: Continue to monitor H&H.  She was transfused with a unit of PRBC on 11/2.  Hemoglobin today stable.  Goals of care: Palliative care following.  Initially she was on comfort care which has been discontinued because her oral status improved.  Case manager is following for placing her on skilled nursing facility.  Palliative care recommending hospice at the skilled nursing facility  Pressure Injury  05/22/21 Buttocks Right Deep Tissue Pressure Injury - Purple or maroon localized  area of discolored intact skin or blood-filled blister due to damage of underlying soft tissue from pressure and/or shear. (Active)  05/22/21 0300  Location: Buttocks  Location Orientation: Right  Staging: Deep Tissue Pressure Injury - Purple or maroon localized area of discolored intact skin or blood-filled blister due to damage of underlying soft tissue from pressure and/or shear.  Wound Description (Comments):   Present on Admission: Yes     Pressure Injury 05/22/21 Coccyx Stage 2 -  Partial thickness loss of dermis presenting as a shallow open injury with a red, pink wound bed without slough. (Active)  05/22/21 0300  Location: Coccyx  Location Orientation:   Staging: Stage 2 -  Partial thickness loss of dermis presenting as a shallow open injury with a red, pink wound bed without slough.  Wound Description (Comments):   Present on Admission: Yes     Pressure Injury 05/22/21 Cervical Deep Tissue Pressure Injury - Purple or maroon localized area of discolored intact skin or blood-filled blister due to damage of underlying soft tissue from pressure and/or shear. (Active)  05/22/21 0300  Location: Cervical  Location Orientation:   Staging: Deep Tissue Pressure Injury - Purple or maroon localized area of discolored intact skin or blood-filled blister due to damage of underlying soft tissue from pressure and/or shear.  Wound Description (Comments):   Present on Admission: Yes     Pressure Injury 06/05/21 Buttocks Left Unstageable - Full thickness tissue loss in which the base of the injury is covered by slough (yellow, tan, gray, green or brown) and/or eschar (tan, brown or black) in the wound bed. Long curved wound on whole left (Active)  06/05/21 0622  Location: Buttocks  Location Orientation: Left  Staging: Unstageable - Full thickness tissue loss in which the base of the injury is covered by slough (yellow, tan, gray, green or brown) and/or eschar (tan, brown or black) in the wound bed.   Wound Description (Comments): Long curved wound on whole left buttocks. black yellow red pink and bloody  Present on Admission:    Patient also has a Foley catheter placed for urinary retention.  She also has several pressure ulcers on the sacrum.  She will be continued on Foley catheter.         Nutrition Problem: Inadequate oral intake Etiology: acute illness      DVT prophylaxis:SCD Code Status: DNR Family Communication: Called daughter for update on 06/18/21  Status is: Inpatient      Consultants: Neurosurgery, PCCM, palliative care  Procedures: As above  Antimicrobials:  Anti-infectives (From admission, onward)    Start     Dose/Rate Route Frequency Ordered Stop   05/30/21 2200  linezolid (ZYVOX) tablet 600 mg        600 mg Per Tube Every 12 hours 05/30/21 1512 06/06/21 2105   05/24/21 1800  linezolid (ZYVOX) IVPB 600 mg  Status:  Discontinued        600 mg 300 mL/hr over 60 Minutes Intravenous Every 12 hours 05/24/21 1542 05/30/21 1512   05/24/21 1000  cefTRIAXone (ROCEPHIN) 2 g in sodium chloride 0.9 % 100 mL IVPB        2 g 200 mL/hr over 30 Minutes Intravenous Every 24 hours 05/23/21 1510 05/31/21 0926   05/24/21 0000  vancomycin (VANCOCIN) IVPB 1000 mg/200 mL premix  Status:  Discontinued        1,000 mg 200 mL/hr over 60 Minutes Intravenous  Every 48 hours 05/22/21 0303 05/23/21 1510   05/23/21 1000  ceFEPIme (MAXIPIME) 2 g in sodium chloride 0.9 % 100 mL IVPB        2 g 200 mL/hr over 30 Minutes Intravenous Every 12 hours 05/23/21 0937 05/23/21 2135   05/22/21 2100  ceFEPIme (MAXIPIME) 2 g in sodium chloride 0.9 % 100 mL IVPB  Status:  Discontinued        2 g 200 mL/hr over 30 Minutes Intravenous Every 24 hours 05/22/21 0304 05/23/21 0937   05/21/21 2100  vancomycin (VANCOREADY) IVPB 2000 mg/400 mL        2,000 mg 200 mL/hr over 120 Minutes Intravenous  Once 05/21/21 2029 05/22/21 0027   05/21/21 2030  ceFEPIme (MAXIPIME) 2 g in sodium chloride 0.9 %  100 mL IVPB        2 g 200 mL/hr over 30 Minutes Intravenous  Once 05/21/21 2023 05/21/21 2210   05/21/21 2030  metroNIDAZOLE (FLAGYL) IVPB 500 mg        500 mg 100 mL/hr over 60 Minutes Intravenous  Once 05/21/21 2023 05/21/21 2315   05/21/21 2030  vancomycin (VANCOCIN) IVPB 1000 mg/200 mL premix  Status:  Discontinued        1,000 mg 200 mL/hr over 60 Minutes Intravenous  Once 05/21/21 2023 05/21/21 2029       Subjective:  Patient seen and examined the bedside this morning.  Hemodynamically stable.  Lying in bed as usual.  Looks comfortable.  Denies any new complaints.  Alert and awake but confused to date and time.  Objective: Vitals:   06/17/21 1102 06/17/21 1741 06/17/21 2006 06/18/21 0521  BP: (!) 177/78 (!) 177/76 (!) 176/86 (!) 170/85  Pulse: 94 92 94 85  Resp: 16 16 18 16   Temp:  100 F (37.8 C) 100.1 F (37.8 C) 98.5 F (36.9 C)  TempSrc:  Oral Oral Oral  SpO2: 94% 97% 96% 96%  Weight:      Height:        Intake/Output Summary (Last 24 hours) at 06/18/2021 0727 Last data filed at 06/18/2021 0522 Gross per 24 hour  Intake 1017 ml  Output 500 ml  Net 517 ml   Filed Weights   06/06/21 0500 06/07/21 0500 06/08/21 0500  Weight: 88.8 kg 88.4 kg 87.9 kg    Examination:   General exam: Chronically looking, not in distress, obese HEENT: PERRL Respiratory system:  no wheezes or crackles  Cardiovascular system: S1 & S2 heard, RRR.  Gastrointestinal system: Abdomen is nondistended, soft and nontender. Central nervous system: Alert and awake, confused, follows command, right hemiparesis Extremities: Trace bilateral lower EXTR edema, lymphedema of left upper extremity Skin: Pressure ulcers as above GU: Foley  Data Reviewed: I have personally reviewed following labs and imaging studies  CBC: Recent Labs  Lab 06/13/21 0640 06/13/21 1110 06/16/21 0455  WBC 9.9  --  12.7*  NEUTROABS  --   --  8.4*  HGB 6.7* 10.6* 10.5*  HCT 21.2* 33.3* 33.8*  MCV 101.0*  --   101.2*  PLT 340  --  481*   Basic Metabolic Panel: Recent Labs  Lab 06/13/21 0640 06/16/21 0455  NA 142 144  K 4.1 3.8  CL 107 109  CO2 29 28  GLUCOSE 124* 108*  BUN 25* 25*  CREATININE 0.93 0.84  CALCIUM 8.1* 8.2*  MG 2.1  --    GFR: Estimated Creatinine Clearance: 70.6 mL/min (by C-G formula based on SCr of 0.84 mg/dL).  Liver Function Tests: Recent Labs  Lab 06/13/21 0640  AST 19  ALT 13  ALKPHOS 63  BILITOT 0.6  PROT 6.3*  ALBUMIN 2.5*   No results for input(s): LIPASE, AMYLASE in the last 168 hours. No results for input(s): AMMONIA in the last 168 hours. Coagulation Profile: No results for input(s): INR, PROTIME in the last 168 hours. Cardiac Enzymes: No results for input(s): CKTOTAL, CKMB, CKMBINDEX, TROPONINI in the last 168 hours. BNP (last 3 results) No results for input(s): PROBNP in the last 8760 hours. HbA1C: No results for input(s): HGBA1C in the last 72 hours. CBG: No results for input(s): GLUCAP in the last 168 hours.  Lipid Profile: No results for input(s): CHOL, HDL, LDLCALC, TRIG, CHOLHDL, LDLDIRECT in the last 72 hours. Thyroid Function Tests: No results for input(s): TSH, T4TOTAL, FREET4, T3FREE, THYROIDAB in the last 72 hours. Anemia Panel: No results for input(s): VITAMINB12, FOLATE, FERRITIN, TIBC, IRON, RETICCTPCT in the last 72 hours. Sepsis Labs: No results for input(s): PROCALCITON, LATICACIDVEN in the last 168 hours.  No results found for this or any previous visit (from the past 240 hour(s)).       Radiology Studies: No results found.      Scheduled Meds:  aspirin EC  81 mg Oral Daily   Chlorhexidine Gluconate Cloth  6 each Topical Daily   collagenase   Topical Daily   feeding supplement (NEPRO CARB STEADY)  237 mL Oral TID BM   Gerhardt's butt cream   Topical TID   metoprolol tartrate  25 mg Oral BID   multivitamin with minerals  1 tablet Oral Daily   pantoprazole  40 mg Oral Daily   vitamin B-12  1,000 mcg Oral  Daily   Continuous Infusions:   LOS: 27 days    Time spent:35 mins. More than 50% of that time was spent in counseling and/or coordination of care.      Shelly Coss, MD [facility] P11/02/2021, 7:27 AM

## 2021-06-18 NOTE — Consult Note (Signed)
[facility] Nurse Consult Note: Patient receiving care in [facility]. Reason for Consult: Re-evaluation of buttock wounds secondary to blue drainage the last 2 days. Wound type: unstageable PIs to bilateral buttocks secondary to sitting on a toilet seat for days prior to arrival to the hospital Pressure Injury POA: Yes. Initially presented as DTPIs. Measurement: right buttock/ischial wound measures 3 cm x 13 cm and is tan/yellow non-viable tissue. Left buttock/ischial wound measures 20 cm x 3 cm and is tan/yellow non-viable tissue Wound bed: Drainage (amount, consistency, odor) no blue or green drainage noted today at the time of my assessment. Periwound: pink tissue Dressing procedure/placement/frequency: Place 1/4% Dakin's moistened gauze over the bilateral buttock wounds, cover with foam dressings. Ordered for twice daily x 7 days.  Monitor the wound area(s) for worsening of condition such as: Signs/symptoms of infection,  Increase in size,  Development of or worsening of odor, Development of pain, or increased pain at the affected locations.  Notify the medical team if any of these develop.  Thank you for the consult.  Discussed plan of care with the patient and bedside nurse.  [place] nurse will not follow at this time.  Please re-consult the [facility] team if needed.  Val Riles, RN, MSN, CWOCN, CNS-BC, pager [phone]

## 2021-06-18 NOTE — TOC Progression Note (Signed)
Transition of Care [facility]) - Progression Note    Patient Details  Name: Desiree Mitchell MRN: [id number] Date of Birth: 04-Dec-1955  Transition of Care [facility]) CM/SW [place], RN Phone Number: 06/18/2021, 2:31 PM  Clinical Narrative: Attempted to call [facility] to discuss Authorization for placement, did not return call. Lorenza [place] from internal Palliative request that patient be followed by outpatient Palliative.      Expected Discharge Plan: [place] Barriers to Discharge: No SNF bed  Expected Discharge Plan and Services Expected Discharge Plan: [place] In-house Referral: Clinical Social Work Discharge Planning Services: CM Consult Post Acute Care Choice: [place] arrangements for the past 2 months: Single Family Home                 DME Arranged: N/A DME Agency: NA       HH Arranged: NA HH Agency: NA         Social Determinants of Health (SDOH) Interventions    Readmission Risk Interventions No flowsheet data found.

## 2021-06-18 NOTE — Progress Notes (Signed)
Physical Therapy Treatment Patient Details Name: Desiree Mitchell MRN: [id number] DOB: [birth year] Today's Date: 06/18/2021   History of Present Illness Pt is a 65 y.o. female presenting to hospital 05/21/21 with AMS (found unresponsive by family).  Significant pressure sore present back of neck, buttocks, and coccyx.  Pt noted to be infested with bed bugs and roaches.  Pt admitted with acute respiratory failure, rhabdomyolysis, metabolic acidosis, acute kidney injury, sepsis, VA shunt malfunction, transaminitis, and posterior neck/buttocks/coccyx pressure injuries.  S/p removal ventriculoatrial shunt and placement of L parietal approach ventriculostomy 05/22/21; extubated 10/11; MRI brain 10/14 showing few scattered small acute infarcts involving different vascular territories (watershed type infarcts); TEE 10/18; and EVD removed 10/19.    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    PT Comments    Pt in air bed, agreeable to treatment and denies pain at rest. Pt mentioned she wants to "go home and see her cat" and therefore is motivated for therapy session. Primary limitations remain decreased overall mobility requiring considerable effort for all aspects of mobility. Pt is slow to respond with cues but did initiate more movement when prompted this session. Pt was unable to maintain sitting balance with new air bed due to height of bed and placing her in additional posterior pelvic tilt. PT to bring step stool to reassess during next session. Discharge recommendations remain SNF. Skilled PT intervention is indicated to address deficits in function, mobility, and to return to PLOF as able.     Recommendations for follow up therapy are one component of a multi-disciplinary discharge planning process, led by the attending physician.  Recommendations may be updated based on patient status, additional functional criteria and insurance  authorization.  Follow Up Recommendations  Skilled nursing-short term rehab (<3 hours/day)     Assistance Recommended at Discharge Frequent or constant Supervision/Assistance  Equipment Recommendations  Other (comment) (TBD next venue of care)    Recommendations for Other Services       Precautions / Restrictions Precautions Precautions: Fall Precaution Comments: Aspiration; gastric tube; R chest port;  buttocks, and coccyx pressure injuries     Mobility  Bed Mobility Overal bed mobility: Needs Assistance Bed Mobility: Rolling Rolling: Mod assist   Supine to sit: Max assist;+2 for physical assistance Sit to supine: Max assist;+2 for physical assistance   General bed mobility comments: Requires trunk and BLE support    Transfers                   General transfer comment: Deferred, pt unable to maintain sitting balance    Ambulation/Gait               General Gait Details: deferred   Stairs             Wheelchair Mobility    Modified Rankin (Stroke Patients Only)       Balance Overall balance assessment: Needs assistance Sitting-balance support: Feet unsupported;Bilateral upper extremity supported Sitting balance-Leahy Scale: Poor Sitting balance - Comments: Required MOD A to maintain upright sitting, pt unable to maintain without support Postural control: Posterior lean     Standing balance comment: not assessed due to weakness from sitting upright.                            Cognition Arousal/Alertness: Awake/alert Behavior During Therapy: Flat affect Overall Cognitive Status: No family/caregiver present to determine  baseline cognitive functioning                                 General Comments: Increased time for task processing        Exercises Other Exercises Other Exercises: Sitting EOB ~10 min w/ cues for hand positioning and balance strategies to maintain trunk upright, pt is now in pressure  relieving bed which places pt in PPT and unable to reach feet to floor making sitting challenging    General Comments General comments (skin integrity, edema, etc.): Sacral wound clean, covered      Pertinent Vitals/Pain Pain Assessment: Faces Faces Pain Scale: Hurts little more Pain Location: Sacrum, back Pain Descriptors / Indicators: Discomfort;Grimacing;Sore Pain Intervention(s): Limited activity within patient's tolerance;Monitored during session    Home Living                          Prior Function            PT Goals (current goals can now be found in the care plan section) Progress towards PT goals: Progressing toward goals    Frequency    Min 2X/week      PT Plan Current plan remains appropriate    Co-evaluation              AM-PAC PT "6 Clicks" Mobility   Outcome Measure  Help needed turning from your back to your side while in a flat bed without using bedrails?: A Lot Help needed moving from lying on your back to sitting on the side of a flat bed without using bedrails?: A Lot Help needed moving to and from a bed to a chair (including a wheelchair)?: Total Help needed standing up from a chair using your arms (e.g., wheelchair or bedside chair)?: A Lot Help needed to walk in hospital room?: Total Help needed climbing 3-5 steps with a railing? : Total 6 Click Score: 9    End of Session   Activity Tolerance: Patient tolerated treatment well Patient left: in bed;with call bell/phone within reach;with bed alarm set;with nursing/sitter in room Nurse Communication: Mobility status PT Visit Diagnosis: Other abnormalities of gait and mobility (R26.89);Muscle weakness (generalized) (M62.81);Difficulty in walking, not elsewhere classified (R26.2)     Time: 7353-2992 PT Time Calculation (min) (ACUTE ONLY): 23 min  Charges:                        [employer], SPT

## 2021-06-18 NOTE — Progress Notes (Signed)
Bright blue drainage noted on buttocks wound dressings during wound care, noted prior morning as well during wound care. Will re-order WOCN consult for drainage color to assure dressing changes are appropriate and correct for situation.   Buttocks and neck dressings changed per orders.

## 2021-06-19 DIAGNOSIS — J9601 Acute respiratory failure with hypoxia: Secondary | ICD-10-CM | POA: Diagnosis not present

## 2021-06-19 DIAGNOSIS — R6520 Severe sepsis without septic shock: Secondary | ICD-10-CM | POA: Diagnosis not present

## 2021-06-19 DIAGNOSIS — A419 Sepsis, unspecified organism: Secondary | ICD-10-CM | POA: Diagnosis not present

## 2021-06-19 LAB — CBC WITH DIFFERENTIAL/PLATELET
Abs Immature Granulocytes: 0.04 10*3/uL (ref 0.00–0.07)
Basophils Absolute: 0.1 10*3/uL (ref 0.0–0.1)
Basophils Relative: 1 %
Eosinophils Absolute: 0.6 10*3/uL — ABNORMAL HIGH (ref 0.0–0.5)
Eosinophils Relative: 6 %
HCT: 34.6 % — ABNORMAL LOW (ref 36.0–46.0)
Hemoglobin: 11 g/dL — ABNORMAL LOW (ref 12.0–15.0)
Immature Granulocytes: 0 %
Lymphocytes Relative: 18 %
Lymphs Abs: 1.9 10*3/uL (ref 0.7–4.0)
MCH: 31.3 pg (ref 26.0–34.0)
MCHC: 31.8 g/dL (ref 30.0–36.0)
MCV: 98.3 fL (ref 80.0–100.0)
Monocytes Absolute: 0.9 10*3/uL (ref 0.1–1.0)
Monocytes Relative: 9 %
Neutro Abs: 7.1 10*3/uL (ref 1.7–7.7)
Neutrophils Relative %: 66 %
Platelets: 574 10*3/uL — ABNORMAL HIGH (ref 150–400)
RBC: 3.52 MIL/uL — ABNORMAL LOW (ref 3.87–5.11)
RDW: 14.2 % (ref 11.5–15.5)
WBC: 10.7 10*3/uL — ABNORMAL HIGH (ref 4.0–10.5)
nRBC: 0 % (ref 0.0–0.2)

## 2021-06-19 MED ORDER — OXYCODONE HCL 5 MG PO TABS: 5.0000 mg | ORAL_TABLET | Freq: Three times a day (TID) | ORAL | 0 refills | Status: DC | PRN

## 2021-06-19 NOTE — Progress Notes (Signed)
Nutrition Follow-up  DOCUMENTATION CODES:  Obesity unspecified  INTERVENTION:  Obtain updated weight.  Continue Nepro shakes TID.  Continue Magic Cup TID.  Continue MVI with minerals daily.  Continue to encourage PO and supplement intake.  NUTRITION DIAGNOSIS:  Inadequate oral intake related to acute illness as evidenced by NPO status. - resolved  GOAL:  Patient will meet greater than or equal to 90% of their needs. - progressing  MONITOR:  Labs, Weight trends, TF tolerance, Skin, I & O's  REASON FOR ASSESSMENT:  Consult Assessment of nutrition requirement/status  ASSESSMENT:  65 y/o female with h/o HTN, asthma, breast cancer, CVA and subarachnoid hemorrhage s/p shunt placement for management of hydrocephalus > 10 years ago who is now admitted with shunt malfunction, sepsis and AMS now s/p left parietal approach ventriculostomy 10/11 10/28 - refused PEG placement, transitioned to comfort care 10/31 - comfort care rescinded  11/1- s/p BSE - advanced to dysphagia 2 diet with nectar thick liquids  11/5 - rectal pouch removed  Medically stable for discharge whenever possible.  Per Epic, pt has eaten an average of 61% over the past 8 meals.  Pt sleeping soundly at the time of RD visit. Pt did not awaken to touch or voice.  Pt due for new weight. RD to order.  Continue current nutrition plan.  Supplements: Nepro shakes TID, Magic Cup TID  Medications: reviewed; MVI with minerals, Protonix, Vitamin B12  Labs: reviewed   Diet Order:   Diet Order             DIET DYS 2 Room service appropriate? Yes with Assist; Fluid consistency: Nectar Thick  Diet effective now                  EDUCATION NEEDS:  No education needs have been identified at this time  Skin:  Skin Assessment: Skin Integrity Issues: Skin Integrity Issues:: Incisions, Unstageable, Stage II, DTI DTI: rt buttocks, cervical Stage II: coccyx Unstageable: lt buttocks Incisions: closed head and lt  thorat  Last BM:  06/18/21  Height:  Ht Readings from Last 1 Encounters:  06/04/21 5\' 2"  (1.575 m)   Weight:  Wt Readings from Last 1 Encounters:  06/08/21 87.9 kg   BMI:  Body mass index is 35.44 kg/m.  Estimated Nutritional Needs:  Kcal:  1800-2100kcal/day Protein:  90-105g/day Fluid:  1.5-1.8L/day  Derrel Nip, RD, LDN (she/her/hers) Clinical Inpatient Dietitian RD Pager/After-Hours/Weekend Pager # in [place]

## 2021-06-19 NOTE — Care Management Important Message (Addendum)
Important Message  Patient Details  Name: Desiree Mitchell MRN: [id number] Date of Birth: [birth year]   Medicare Important Message Given:  Other (see comment)  I reviewed the Important Message from Medicare with daughter, [name] [phone]) per RN CM. I have sent a copy via secure e-mail to [email or web address] as directed. She will sign/date and return a signed copy to me.  I thanked her for time.   06/20/21 received signed IM on Tuesday afternoon. Will forward to HIM for scanning.   Juliann Pulse A Hayat [place] 06/19/2021, 4:08 PM

## 2021-06-19 NOTE — TOC Progression Note (Signed)
Transition of Care [facility]) - Progression Note    Patient Details  Name: Desiree Mitchell MRN: [id number] Date of Birth: 1956-02-03  Transition of Care [facility]) CM/SW Contact  Shelbie Hutching, RN Phone Number: 06/19/2021, 1:22 PM  Clinical Narrative:    Insurance authorization still pending.   Expected Discharge Plan: Skilled Nursing Facility Barriers to Discharge: Insurance Authorization  Expected Discharge Plan and Services Expected Discharge Plan: [place] In-house Referral: Clinical Social Work Discharge Planning Services: CM Consult Post Acute Care Choice: [place] Living arrangements for the past 2 months: Single Family Home                 DME Arranged: N/A DME Agency: NA       HH Arranged: NA HH Agency: NA         Social Determinants of Health (SDOH) Interventions    Readmission Risk Interventions No flowsheet data found.

## 2021-06-19 NOTE — Progress Notes (Signed)
PROGRESS NOTE    Desiree Mitchell  MEQ:[id number] DOB: [birth year] DOA: 05/21/2021 PCP: Pcp, No   Chief Complain: Found unresponsive  Brief Narrative: Patient is a 65 year old female with history of breast cancer status post bilateral mastectomy, history of VA shunt  and prior subarachnoid hemorrhage/hydrocephalus, CVA with residual right-sided deficit, asthma, hypertension who presented at emergency department after she was found not responsive by family.  On presentation, she was hypoxic, hypotensive.  She was also found to be infested with bedbugs and cockroaches.  CT head without contrast showed development of mild hydrocephalus, periventricular edema associated with malpositioned left VA shunt.  She was seen by neurosurgery and was adequately taken to the OR for removal of her VA shunt and placement of external ventricular drain.  Patient was transferred to hospital service on 05/30/2021.  Several discussion was held with family about goals of care.  Initially she was on comfort care but since her mental status slowly improved, family wanted to discontinue comfort care, family decided on placing her in a skilled nursing facility.  Case manager following.  Hospice also following and recommending hospice support on long-term care.  Medically stable for discharge whenever possible.  Assessment & Plan:   Principal Problem:   Sepsis ([place]) Active Problems:   Endotracheally intubated   On mechanically assisted ventilation (HCC)   Shunt malfunction   Rhabdomyolysis   AKI (acute kidney injury) ([place])   Increased anion gap metabolic acidosis   Acute encephalopathy   Transaminitis   Pressure injury of skin   Acute embolic stroke ([place])   Palliative care by specialist   DNR (do not resuscitate)   Severe sepsis/Proteus/staph epidermidis septicemia: ID was following.  Etiology was suspected to be from chronic wounds.  No evidence of recent infection with negative CSF cultures.  TTE unrevealing.   Unable to perform TEE due to hemodynamic abnormalities of her epiglottis.  Completed 10 days course of ceftriaxone and for the source of linezolid.  ID signed off.  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  CT head showed development of periventricular edema associated with malpositioned left VA shunt.  Emergently taken to the OR on 10/11 with complete removal of ventriculoperitoneal shunt and placement of left parietal approach ventriculostomy.  External ventriculostomy drain removed by neurosurgery on 05/30/21.  CSF cultures negative.  No further intervention planned by neurosurgery team.  Acute hypoxic respiratory failure: Resolved.  Patient remains on mechanical ventilation following some removal and EVD placement.  Currently on room air  Multifocal strokes: As per MRI which showed multifocal infarcts, concerning for embolic in nature.  Neurology was consulted.  TTE unrevealing.  Unable to undergo TEE.  She has history of right-sided residual deficits  Hypertension: Currently mildly hypertensive.  Continue current medications, monitor blood pressure  Low normal vitamin B12: Received 3 days of IM vitamin B12 supplementation.  Continue oral supplementation.  Dysphagia: Had poor oral intake.  She declined PEG tube placement.  NG tube has been removed, tube feeding discontinued.  Continue comfort feeds.  Speech therapy following.  On dysphagia 2 diet.  GERD: Continue PPI  Normocytic anemia: Continue to monitor H&H.  She was transfused with a unit of PRBC on 11/2.  Hemoglobin today stable.  Goals of care: Palliative care following.  Initially she was on comfort care which has been discontinued because her oral status improved.  Case manager is following for placing her on skilled nursing facility.  Palliative care recommending hospice at  the skilled nursing facility  Pressure Injury 05/22/21 Buttocks Right Deep  Tissue Pressure Injury - Purple or maroon localized area of discolored intact skin or blood-filled blister due to damage of underlying soft tissue from pressure and/or shear. (Active)  05/22/21 0300  Location: Buttocks  Location Orientation: Right  Staging: Deep Tissue Pressure Injury - Purple or maroon localized area of discolored intact skin or blood-filled blister due to damage of underlying soft tissue from pressure and/or shear.  Wound Description (Comments):   Present on Admission: Yes     Pressure Injury 05/22/21 Coccyx Stage 2 -  Partial thickness loss of dermis presenting as a shallow open injury with a red, pink wound bed without slough. (Active)  05/22/21 0300  Location: Coccyx  Location Orientation:   Staging: Stage 2 -  Partial thickness loss of dermis presenting as a shallow open injury with a red, pink wound bed without slough.  Wound Description (Comments):   Present on Admission: Yes     Pressure Injury 05/22/21 Cervical Deep Tissue Pressure Injury - Purple or maroon localized area of discolored intact skin or blood-filled blister due to damage of underlying soft tissue from pressure and/or shear. (Active)  05/22/21 0300  Location: Cervical  Location Orientation:   Staging: Deep Tissue Pressure Injury - Purple or maroon localized area of discolored intact skin or blood-filled blister due to damage of underlying soft tissue from pressure and/or shear.  Wound Description (Comments):   Present on Admission: Yes     Pressure Injury 06/05/21 Buttocks Left Unstageable - Full thickness tissue loss in which the base of the injury is covered by slough (yellow, tan, gray, green or brown) and/or eschar (tan, brown or black) in the wound bed. Long curved wound on whole left (Active)  06/05/21 0622  Location: Buttocks  Location Orientation: Left  Staging: Unstageable - Full thickness tissue loss in which the base of the injury is covered by slough (yellow, tan, gray, green or brown)  and/or eschar (tan, brown or black) in the wound bed.  Wound Description (Comments): Long curved wound on whole left buttocks. black yellow red pink and bloody  Present on Admission:    Patient also has a Foley catheter placed for urinary retention.  She also has several pressure ulcers on the sacrum.  She will be continued on Foley catheter.         Nutrition Problem: Inadequate oral intake Etiology: acute illness      DVT prophylaxis:SCD Code Status: DNR Family Communication: Called daughter for update on 06/18/21  Status is: Inpatient      Consultants: Neurosurgery, PCCM, palliative care  Procedures: As above  Antimicrobials:  Anti-infectives (From admission, onward)    Start     Dose/Rate Route Frequency Ordered Stop   05/30/21 2200  linezolid (ZYVOX) tablet 600 mg        600 mg Per Tube Every 12 hours 05/30/21 1512 06/06/21 2105   05/24/21 1800  linezolid (ZYVOX) IVPB 600 mg  Status:  Discontinued        600 mg 300 mL/hr over 60 Minutes Intravenous Every 12 hours 05/24/21 1542 05/30/21 1512   05/24/21 1000  cefTRIAXone (ROCEPHIN) 2 g in sodium chloride 0.9 % 100 mL IVPB        2 g 200 mL/hr over 30 Minutes Intravenous Every 24 hours 05/23/21 1510 05/31/21 0926   05/24/21 0000  vancomycin (VANCOCIN) IVPB 1000 mg/200 mL premix  Status:  Discontinued        1,000  mg 200 mL/hr over 60 Minutes Intravenous Every 48 hours 05/22/21 0303 05/23/21 1510   05/23/21 1000  ceFEPIme (MAXIPIME) 2 g in sodium chloride 0.9 % 100 mL IVPB        2 g 200 mL/hr over 30 Minutes Intravenous Every 12 hours 05/23/21 0937 05/23/21 2135   05/22/21 2100  ceFEPIme (MAXIPIME) 2 g in sodium chloride 0.9 % 100 mL IVPB  Status:  Discontinued        2 g 200 mL/hr over 30 Minutes Intravenous Every 24 hours 05/22/21 0304 05/23/21 0937   05/21/21 2100  vancomycin (VANCOREADY) IVPB 2000 mg/400 mL        2,000 mg 200 mL/hr over 120 Minutes Intravenous  Once 05/21/21 2029 05/22/21 0027   05/21/21  2030  ceFEPIme (MAXIPIME) 2 g in sodium chloride 0.9 % 100 mL IVPB        2 g 200 mL/hr over 30 Minutes Intravenous  Once 05/21/21 2023 05/21/21 2210   05/21/21 2030  metroNIDAZOLE (FLAGYL) IVPB 500 mg        500 mg 100 mL/hr over 60 Minutes Intravenous  Once 05/21/21 2023 05/21/21 2315   05/21/21 2030  vancomycin (VANCOCIN) IVPB 1000 mg/200 mL premix  Status:  Discontinued        1,000 mg 200 mL/hr over 60 Minutes Intravenous  Once 05/21/21 2023 05/21/21 2029       Subjective:  Patient seen and examined at the bedside this morning.  No new changes.  Lying on the bed, alert and awake.  Communicates well but confused to time.  Not agitated.  No new complaints  Objective: Vitals:   06/18/21 1600 06/18/21 2009 06/19/21 0028 06/19/21 0520  BP: 130/78 (!) 174/78 (!) 151/69 (!) 157/74  Pulse: 84 90 82 86  Resp:  18 18 18   Temp: 98.1 F (36.7 C) 98 F (36.7 C) 98.5 F (36.9 C) 98.2 F (36.8 C)  TempSrc: Oral Oral Oral Oral  SpO2: 98% 92% 95% 96%  Weight:      Height:        Intake/Output Summary (Last 24 hours) at 06/19/2021 0729 Last data filed at 06/19/2021 0520 Gross per 24 hour  Intake 290 ml  Output 450 ml  Net -160 ml   Filed Weights   06/06/21 0500 06/07/21 0500 06/08/21 0500  Weight: 88.8 kg 88.4 kg 87.9 kg    Examination:   General exam: Chronically ill looking, not in distress, obese HEENT: PERRL Respiratory system:  no wheezes or crackles  Cardiovascular system: S1 & S2 heard, RRR.  Chemo-Port on the right chest Gastrointestinal system: Abdomen is nondistended, soft and nontender. Central nervous system: Alert and awake, confused, follows commands, right hemiparesis Extremities: Trace bilateral lower extremity edema, lymphedema of left upper extremity  skin: Pressure ulcers as above GU: Foley  Data Reviewed: I have personally reviewed following labs and imaging studies  CBC: Recent Labs  Lab 06/13/21 0640 06/13/21 1110 06/16/21 0455 06/19/21 0556   WBC 9.9  --  12.7* 10.7*  NEUTROABS  --   --  8.4* 7.1  HGB 6.7* 10.6* 10.5* 11.0*  HCT 21.2* 33.3* 33.8* 34.6*  MCV 101.0*  --  101.2* 98.3  PLT 340  --  641* 811*   Basic Metabolic Panel: Recent Labs  Lab 06/13/21 0640 06/16/21 0455  NA 142 144  K 4.1 3.8  CL 107 109  CO2 29 28  GLUCOSE 124* 108*  BUN 25* 25*  CREATININE 0.93 0.84  CALCIUM 8.1*  8.2*  MG 2.1  --    GFR: Estimated Creatinine Clearance: 70.6 mL/min (by C-G formula based on SCr of 0.84 mg/dL). Liver Function Tests: Recent Labs  Lab 06/13/21 0640  AST 19  ALT 13  ALKPHOS 63  BILITOT 0.6  PROT 6.3*  ALBUMIN 2.5*   No results for input(s): LIPASE, AMYLASE in the last 168 hours. No results for input(s): AMMONIA in the last 168 hours. Coagulation Profile: No results for input(s): INR, PROTIME in the last 168 hours. Cardiac Enzymes: No results for input(s): CKTOTAL, CKMB, CKMBINDEX, TROPONINI in the last 168 hours. BNP (last 3 results) No results for input(s): PROBNP in the last 8760 hours. HbA1C: No results for input(s): HGBA1C in the last 72 hours. CBG: No results for input(s): GLUCAP in the last 168 hours.  Lipid Profile: No results for input(s): CHOL, HDL, LDLCALC, TRIG, CHOLHDL, LDLDIRECT in the last 72 hours. Thyroid Function Tests: No results for input(s): TSH, T4TOTAL, FREET4, T3FREE, THYROIDAB in the last 72 hours. Anemia Panel: No results for input(s): VITAMINB12, FOLATE, FERRITIN, TIBC, IRON, RETICCTPCT in the last 72 hours. Sepsis Labs: No results for input(s): PROCALCITON, LATICACIDVEN in the last 168 hours.  No results found for this or any previous visit (from the past 240 hour(s)).       Radiology Studies: No results found.      Scheduled Meds:  amLODipine  5 mg Oral Daily   aspirin EC  81 mg Oral Daily   Chlorhexidine Gluconate Cloth  6 each Topical Daily   feeding supplement (NEPRO CARB STEADY)  237 mL Oral TID BM   metoprolol tartrate  25 mg Oral BID    multivitamin with minerals  1 tablet Oral Daily   pantoprazole  40 mg Oral Daily   sodium hypochlorite   Topical BID   vitamin B-12  1,000 mcg Oral Daily   Continuous Infusions:   LOS: 28 days    Time spent:15 mins. More than 50% of that time was spent in counseling and/or coordination of care.      Shelly Coss, MD [facility] P11/03/2021, 7:29 AM

## 2021-06-20 DIAGNOSIS — J9601 Acute respiratory failure with hypoxia: Secondary | ICD-10-CM | POA: Diagnosis not present

## 2021-06-20 DIAGNOSIS — R6520 Severe sepsis without septic shock: Secondary | ICD-10-CM | POA: Diagnosis not present

## 2021-06-20 DIAGNOSIS — A419 Sepsis, unspecified organism: Secondary | ICD-10-CM | POA: Diagnosis not present

## 2021-06-20 NOTE — Progress Notes (Signed)
   06/20/21 1543  Pressure Injury 05/22/21 Buttocks Right Deep Tissue Pressure Injury - Purple or maroon localized area of discolored intact skin or blood-filled blister due to damage of underlying soft tissue from pressure and/or shear.  Date First Assessed/Time First Assessed: 05/22/21 0300   Location: Buttocks  Location Orientation: Right  Staging: Deep Tissue Pressure Injury - Purple or maroon localized area of discolored intact skin or blood-filled blister due to damage of underly...  Drainage Amount Moderate  Drainage Description Green;Odor  Pressure Injury 05/22/21 Coccyx Stage 2 -  Partial thickness loss of dermis presenting as a shallow open injury with a red, pink wound bed without slough.  Date First Assessed/Time First Assessed: 05/22/21 0300   Location: Coccyx  Staging: Stage 2 -  Partial thickness loss of dermis presenting as a shallow open injury with a red, pink wound bed without slough.  Present on Admission: Yes  Drainage Amount Minimal  Drainage Description Green  Pressure Injury 06/05/21 Buttocks Left Unstageable - Full thickness tissue loss in which the base of the injury is covered by slough (yellow, tan, gray, green or brown) and/or eschar (tan, brown or black) in the wound bed. Long curved wound on whole left  Date First Assessed/Time First Assessed: 06/05/21 0622   Location: Buttocks  Location Orientation: Left  Staging: Unstageable - Full thickness tissue loss in which the base of the injury is covered by slough (yellow, tan, gray, green or brown) and/or ...  Drainage Amount Minimal  Drainage Description Green  Pt has bright green thick drainage coming from wounds. Paged MD. new order for wound culture.

## 2021-06-20 NOTE — Progress Notes (Signed)
Physical Therapy Treatment Patient Details Name: Desiree Mitchell MRN: [id number] DOB: 06-09-1956 Today's Date: 06/20/2021   History of Present Illness Pt is a 65 y.o. female presenting to hospital 05/21/21 with AMS (found unresponsive by family).  Significant pressure sore present back of neck, buttocks, and coccyx.  Pt noted to be infested with bed bugs and roaches.  Pt admitted with acute respiratory failure, rhabdomyolysis, metabolic acidosis, acute kidney injury, sepsis, VA shunt malfunction, transaminitis, and posterior neck/buttocks/coccyx pressure injuries.  S/p removal ventriculoatrial shunt and placement of L parietal approach ventriculostomy 05/22/21; extubated 10/11; MRI brain 10/14 showing few scattered small acute infarcts involving different vascular territories (watershed type infarcts); TEE 10/18; and EVD removed 10/19.    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    PT Comments    Pt alert in bed, states she is feeling generally poor. At end of session pt becomes saddened and states feeling lonely. PT/OT co-treat this session to maximize functional mobility. Pt continues to require significant assistance for mobility tasks, MAX-A for bed mobility. Pt was able to maintain intermittent sitting balance this session with step to support BLE. SNF remains primary discharge recommendation. Skilled PT intervention is indicated to address deficits in function, mobility, and to return to PLOF as able.     Recommendations for follow up therapy are one component of a multi-disciplinary discharge planning process, led by the attending physician.  Recommendations may be updated based on patient status, additional functional criteria and insurance authorization.  Follow Up Recommendations  Skilled nursing-short term rehab (<3 hours/day)     Assistance Recommended at Discharge Frequent or constant Supervision/Assistance   Equipment Recommendations  None recommended by PT    Recommendations for Other Services       Precautions / Restrictions Precautions Precautions: Fall Precaution Comments: Aspiration; gastric tube; R chest port;  buttocks, and coccyx pressure injuries Restrictions Weight Bearing Restrictions: No     Mobility  Bed Mobility Overal bed mobility: Needs Assistance       Supine to sit: Max assist;+2 for physical assistance Sit to supine: Max assist;+2 for physical assistance   General bed mobility comments: Requires trunk and BLE support    Transfers                   General transfer comment: Deferred, pt stating fatigue    Ambulation/Gait                   Stairs             Wheelchair Mobility    Modified Rankin (Stroke Patients Only)       Balance Overall balance assessment: Needs assistance Sitting-balance support: Bilateral upper extremity supported;Feet supported Sitting balance-Leahy Scale: Poor Sitting balance - Comments: Brought step in and pt able to maintain balance 65% of session, initially requires physical support d/t posterior lean Postural control: Posterior lean                                  Cognition Arousal/Alertness: Awake/alert Behavior During Therapy: WFL for tasks assessed/performed Overall Cognitive Status: No family/caregiver present to determine baseline cognitive functioning                                 General Comments: Increased time for task processing  Exercises General Exercises - Lower Extremity Long Arc Quad: AROM;Both;10 reps;AAROM;Seated Heel Slides: AROM;Both;Supine;AAROM Hip ABduction/ADduction: AROM;Both;10 reps;Strengthening;Seated    General Comments General comments (skin integrity, edema, etc.): L hand edema, non tender and absent warmth      Pertinent Vitals/Pain Pain Assessment: Faces Faces Pain Scale: Hurts little more Pain Location:  Sacrum, back, feet Pain Descriptors / Indicators: Discomfort;Grimacing;Sore Pain Intervention(s): Limited activity within patient's tolerance;Monitored during session;Repositioned    Home Living                          Prior Function            PT Goals (current goals can now be found in the care plan section) Progress towards PT goals: Progressing toward goals    Frequency    Min 2X/week      PT Plan Current plan remains appropriate    Co-evaluation PT/OT/SLP Co-Evaluation/Treatment: Yes Reason for Co-Treatment: To address functional/ADL transfers PT goals addressed during session: Mobility/safety with mobility OT goals addressed during session: ADL's and self-care      AM-PAC PT "6 Clicks" Mobility   Outcome Measure  Help needed turning from your back to your side while in a flat bed without using bedrails?: A Lot Help needed moving from lying on your back to sitting on the side of a flat bed without using bedrails?: A Lot Help needed moving to and from a bed to a chair (including a wheelchair)?: Total Help needed standing up from a chair using your arms (e.g., wheelchair or bedside chair)?: A Lot Help needed to walk in hospital room?: Total Help needed climbing 3-5 steps with a railing? : Total 6 Click Score: 9    End of Session   Activity Tolerance: Patient tolerated treatment well Patient left: in bed;with call bell/phone within reach   PT Visit Diagnosis: Other abnormalities of gait and mobility (R26.89);Muscle weakness (generalized) (M62.81);Difficulty in walking, not elsewhere classified (R26.2)     Time: 0768-0881 PT Time Calculation (min) (ACUTE ONLY): 24 min  Charges:                        [employer], SPT

## 2021-06-20 NOTE — TOC Progression Note (Signed)
Transition of Care [facility]) - Progression Note    Patient Details  Name: Desiree Mitchell MRN: [id number] Date of Birth: [birth year]  [facility] CM/SW Contact  Shelbie Hutching, RN Phone Number: 06/20/2021, 3:55 PM  Clinical Narrative:    Yates Decamp at [place] 4 times today and left messages, waiting on a response for update on insurance authorization. Daughter [name] has been updated via phone.  Expected Discharge Plan: Skilled Nursing Facility Barriers to Discharge: Insurance Authorization  Expected Discharge Plan and Services Expected Discharge Plan: [place] In-house Referral: Clinical Social Work Discharge Planning Services: CM Consult Post Acute Care Choice: [place] Living arrangements for the past 2 months: Single Family Home                 DME Arranged: N/A DME Agency: NA       HH Arranged: NA HH Agency: NA         Social Determinants of Health (SDOH) Interventions    Readmission Risk Interventions No flowsheet data found.

## 2021-06-20 NOTE — Progress Notes (Signed)
Occupational Therapy Treatment Patient Details Name: Desiree Mitchell MRN: [id number] DOB: 11-02-1955 Today's Date: 06/20/2021   History of present illness Pt is a 65 y.o. female presenting to hospital 05/21/21 with AMS (found unresponsive by family).  Significant pressure sore present back of neck, buttocks, and coccyx.  Pt noted to be infested with bed bugs and roaches.  Pt admitted with acute respiratory failure, rhabdomyolysis, metabolic acidosis, acute kidney injury, sepsis, VA shunt malfunction, transaminitis, and posterior neck/buttocks/coccyx pressure injuries.  S/p removal ventriculoatrial shunt and placement of L parietal approach ventriculostomy 05/22/21; extubated 10/11; MRI brain 10/14 showing few scattered small acute infarcts involving different vascular territories (watershed type infarcts); TEE 10/18; and EVD removed 10/19.    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   OT comments  Pt seen with PT for skilled co-treatment. Pt is pleasant and cooperative overall. She is tearful towards the end of session. Pt needing max A of 2 to come to EOB. Pt with fearful with mobility and concerns for fall. Use of elevated step to rest pt's feet for more stability once EOB. Pt with posterior bias in sitting with min -max A for sitting balance while therapist assisted pt with washing her back. Pt changes gown with mod A while seated on EOB. Pt continues to need mod- max A for sitting balance while PT assists pt with LE exercise. +2 assistance to return pt to bed and reposition for comfort. All needs within reach and bed alarm activated.    Recommendations for follow up therapy are one component of a multi-disciplinary discharge planning process, led by the attending physician.  Recommendations may be updated based on patient status, additional functional criteria and insurance authorization.    Follow Up Recommendations  Skilled  nursing-short term rehab (<3 hours/day)    Assistance Recommended at Discharge Frequent or constant Supervision/Assistance  Equipment Recommendations  Other (comment) (defer to next venue of care)       Precautions / Restrictions Precautions Precautions: Fall Precaution Comments: Aspiration; gastric tube; R chest port;  buttocks, and coccyx pressure injuries Restrictions Weight Bearing Restrictions: No       Mobility Bed Mobility Overal bed mobility: Needs Assistance       Supine to sit: Max assist;+2 for physical assistance Sit to supine: Max assist;+2 for physical assistance   General bed mobility comments: Requires trunk and BLE support    Transfers                   General transfer comment: Deferred, pt stating fatigue     Balance Overall balance assessment: Needs assistance Sitting-balance support: Bilateral upper extremity supported;Feet supported Sitting balance-Leahy Scale: Poor Sitting balance - Comments: Brought step in and pt able to maintain balance 65% of session, initially requires physical support d/t posterior lean Postural control: Posterior lean                                 ADL either performed or assessed with clinical judgement   ADL Overall ADL's : Needs assistance/impaired                 Upper Body Dressing : Sitting;Moderate assistance Upper Body Dressing Details (indicate cue type and reason): to don hospital gown  Extremity/Trunk Assessment Upper Extremity Assessment Upper Extremity Assessment: Generalized weakness   Lower Extremity Assessment Lower Extremity Assessment: Generalized weakness        Vision Patient Visual Report: No change from baseline            Cognition Arousal/Alertness: Awake/alert Behavior During Therapy: WFL for tasks assessed/performed Overall Cognitive Status: No family/caregiver present to determine baseline cognitive functioning                                  General Comments: Increased time for task processing          Exercises General Exercises - Lower Extremity Long Arc Quad: AROM;Both;10 reps;AAROM;Seated Heel Slides: AROM;Both;Supine;AAROM Hip ABduction/ADduction: AROM;Both;10 reps;Strengthening;Seated      General Comments L hand edema, non tender and absent warmth    Pertinent Vitals/ Pain       Pain Assessment: Faces Faces Pain Scale: Hurts little more Pain Location: Sacrum, back, feet Pain Descriptors / Indicators: Discomfort;Grimacing;Sore Pain Intervention(s): Limited activity within patient's tolerance;Monitored during session;Repositioned         Frequency  Min 2X/week        Progress Toward Goals  OT Goals(current goals can now be found in the care plan section)  Progress towards OT goals: Progressing toward goals  Acute Rehab OT Goals OT Goal Formulation: With patient Time For Goal Achievement: 06/25/21 Potential to Achieve Goals: [place] Discharge plan remains appropriate;Frequency remains appropriate    Co-evaluation    PT/OT/SLP Co-Evaluation/Treatment: Yes Reason for Co-Treatment: For patient/therapist safety;To address functional/ADL transfers;Complexity of the patient's impairments (multi-system involvement) PT goals addressed during session: Mobility/safety with mobility OT goals addressed during session: ADL's and self-care      AM-PAC OT "6 Clicks" Daily Activity     Outcome Measure   Help from another person eating meals?: A Little Help from another person taking care of personal grooming?: A Little Help from another person toileting, which includes using toliet, bedpan, or urinal?: Total Help from another person bathing (including washing, rinsing, drying)?: A Lot Help from another person to put on and taking off regular upper body clothing?: A Lot Help from another person to put on and taking off regular lower body clothing?: Total 6 Click  Score: 12    End of Session    OT Visit Diagnosis: Muscle weakness (generalized) (M62.81);Adult, failure to thrive (R62.7);Other symptoms and signs involving cognitive function   Activity Tolerance Patient limited by fatigue   Patient Left in bed;with call bell/phone within reach;with bed alarm set   Nurse Communication Mobility status        Time: 4656-8127 OT Time Calculation (min): 23 min  Charges: OT General Charges $OT Visit: 1 Visit OT Treatments $Self Care/Home Management : 8-22 mins  Darleen Crocker, MS, OTR/L , CBIS ascom [phone]  06/20/21, 1:25 PM

## 2021-06-20 NOTE — Progress Notes (Signed)
PROGRESS NOTE    Desiree Mitchell  GGY:[id number] DOB: 27-Feb-1956 DOA: 05/21/2021 PCP: Pcp, No   Chief Complain: Found unresponsive  Brief Narrative: Patient is a 65 year old female with history of breast cancer status post bilateral mastectomy, history of VA shunt  and prior subarachnoid hemorrhage/hydrocephalus, CVA with residual right-sided deficit, asthma, hypertension who presented at emergency department after she was found unresponsive by family.  On presentation, she was hypoxic, hypotensive.  She was also found to be infested with bedbugs and cockroaches.  CT head without contrast showed development of mild hydrocephalus, periventricular edema associated with malpositioned left VA shunt.  She was seen by neurosurgery and was urgently  taken to the OR for removal of her VA shunt and placement of external ventricular drain.  Hospital course also remarkable for bacteremia for which ID was following.  Patient was transferred to [facility] on 05/30/2021.  Several discussions were held with family about goals of care.  Initially she was on comfort care but since her mental status slowly improved, family wanted to discontinue comfort care,then family decided on placing her in a skilled nursing facility.   Hospice agency was also following and recommending hospice support on long-term care.  Since last several days, she has remained hemodynamically stable, overall comfortable.  Medically stable for discharge to SNF whenever possible.TOC following  Assessment & Plan:   Principal Problem:   Sepsis ([place]) Active Problems:   Endotracheally intubated   On mechanically assisted ventilation (HCC)   Shunt malfunction   Rhabdomyolysis   AKI (acute kidney injury) ([place])   Increased anion gap metabolic acidosis   Acute encephalopathy   Transaminitis   Pressure injury of skin   Acute embolic stroke ([place])   Palliative care by specialist   DNR (do not resuscitate)   Severe sepsis/Proteus/staph epidermidis  septicemia: ID were following.  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  No evidence of recent infection with negative CSF cultures.  TTE unrevealing.  Unable to perform TEE due to hemodynamic abnormalities of her epiglottis.  Completed  course of ceftriaxone and linezolid.  ID signed off.  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  CT head showed development of periventricular edema associated with malpositioned left VA shunt.  Emergently taken to the OR on 10/11 with complete removal of ventriculoperitoneal shunt and placement of left parietal approach ventriculostomy.  External ventriculostomy drain removed by neurosurgery on 05/30/21.  CSF cultures negative.  No further intervention planned by neurosurgery team.  Acute hypoxic respiratory failure: Resolved.  Patient was on mechanical ventilation before.  Currently on room air  Multifocal strokes: MRI showed multifocal infarcts, concerning for embolic in nature.  Neurology was consulted.  TTE unrevealing.  Unable to undergo TEE.  She has history of right-sided residual deficits.  No further work-up recommended.  Continue aspirin  Hypertension: Continue current medications.  Continue current medications, monitor blood pressure.  On amlodipine, metoprolol  Low normal vitamin B12: Received 3 days of IM vitamin B12 supplementation.  Continue oral supplementation.  Dysphagia: Has poor oral intake.  She had declined PEG tube placement.  NG tube has been removed, tube feeding discontinued.  Speech therapy following.  On dysphagia 2 diet.  GERD: Continue PPI  Normocytic anemia: Continue to monitor H&H.  She was transfused with a unit of PRBC on 11/2.  Hemoglobin stable in the range of 10-11  Goals of care: Palliative care were following.  Initially she was on  comfort care which has been revoked by family because her overall status improved. ToC  is following for placing her on skilled nursing facility.  Palliative care recommending hospice at the skilled nursing facility.  Prognosis is poor.  Pressure Injury 05/22/21 Buttocks Right Deep Tissue Pressure Injury - Purple or maroon localized area of discolored intact skin or blood-filled blister due to damage of underlying soft tissue from pressure and/or shear. (Active)  05/22/21 0300  Location: Buttocks  Location Orientation: Right  Staging: Deep Tissue Pressure Injury - Purple or maroon localized area of discolored intact skin or blood-filled blister due to damage of underlying soft tissue from pressure and/or shear.  Wound Description (Comments):   Present on Admission: Yes     Pressure Injury 05/22/21 Coccyx Stage 2 -  Partial thickness loss of dermis presenting as a shallow open injury with a red, pink wound bed without slough. (Active)  05/22/21 0300  Location: Coccyx  Location Orientation:   Staging: Stage 2 -  Partial thickness loss of dermis presenting as a shallow open injury with a red, pink wound bed without slough.  Wound Description (Comments):   Present on Admission: Yes     Pressure Injury 05/22/21 Cervical Deep Tissue Pressure Injury - Purple or maroon localized area of discolored intact skin or blood-filled blister due to damage of underlying soft tissue from pressure and/or shear. (Active)  05/22/21 0300  Location: Cervical  Location Orientation:   Staging: Deep Tissue Pressure Injury - Purple or maroon localized area of discolored intact skin or blood-filled blister due to damage of underlying soft tissue from pressure and/or shear.  Wound Description (Comments):   Present on Admission: Yes     Pressure Injury 06/05/21 Buttocks Left Unstageable - Full thickness tissue loss in which the base of the injury is covered by slough (yellow, tan, gray, green or brown) and/or eschar (tan, brown or black) in the wound bed. Long curved wound on whole left (Active)  06/05/21  0622  Location: Buttocks  Location Orientation: Left  Staging: Unstageable - Full thickness tissue loss in which the base of the injury is covered by slough (yellow, tan, gray, green or brown) and/or eschar (tan, brown or black) in the wound bed.  Wound Description (Comments): Long curved wound on whole left buttocks. black yellow red pink and bloody  Present on Admission:    Patient also has a Foley catheter placed for urinary retention.  She also has several pressure ulcers on the sacrum.  She will be continued on Foley catheter on discharge         Nutrition Problem: Inadequate oral intake Etiology: acute illness      DVT prophylaxis:SCD Code Status: DNR Family Communication: Called daughter for update on 06/18/21  Status is: Inpatient      Consultants: Neurosurgery, PCCM, palliative care  Procedures: As above  Antimicrobials:  Anti-infectives (From admission, onward)    Start     Dose/Rate Route Frequency Ordered Stop   05/30/21 2200  linezolid (ZYVOX) tablet 600 mg        600 mg Per Tube Every 12 hours 05/30/21 1512 06/06/21 2105   05/24/21 1800  linezolid (ZYVOX) IVPB 600 mg  Status:  Discontinued        600 mg 300 mL/hr over 60 Minutes Intravenous Every 12 hours 05/24/21 1542 05/30/21 1512   05/24/21 1000  cefTRIAXone (ROCEPHIN) 2 g in sodium chloride 0.9 % 100 mL IVPB        2 g 200 mL/hr  over 30 Minutes Intravenous Every 24 hours 05/23/21 1510 05/31/21 0926   05/24/21 0000  vancomycin (VANCOCIN) IVPB 1000 mg/200 mL premix  Status:  Discontinued        1,000 mg 200 mL/hr over 60 Minutes Intravenous Every 48 hours 05/22/21 0303 05/23/21 1510   05/23/21 1000  ceFEPIme (MAXIPIME) 2 g in sodium chloride 0.9 % 100 mL IVPB        2 g 200 mL/hr over 30 Minutes Intravenous Every 12 hours 05/23/21 0937 05/23/21 2135   05/22/21 2100  ceFEPIme (MAXIPIME) 2 g in sodium chloride 0.9 % 100 mL IVPB  Status:  Discontinued        2 g 200 mL/hr over 30 Minutes Intravenous  Every 24 hours 05/22/21 0304 05/23/21 0937   05/21/21 2100  vancomycin (VANCOREADY) IVPB 2000 mg/400 mL        2,000 mg 200 mL/hr over 120 Minutes Intravenous  Once 05/21/21 2029 05/22/21 0027   05/21/21 2030  ceFEPIme (MAXIPIME) 2 g in sodium chloride 0.9 % 100 mL IVPB        2 g 200 mL/hr over 30 Minutes Intravenous  Once 05/21/21 2023 05/21/21 2210   05/21/21 2030  metroNIDAZOLE (FLAGYL) IVPB 500 mg        500 mg 100 mL/hr over 60 Minutes Intravenous  Once 05/21/21 2023 05/21/21 2315   05/21/21 2030  vancomycin (VANCOCIN) IVPB 1000 mg/200 mL premix  Status:  Discontinued        1,000 mg 200 mL/hr over 60 Minutes Intravenous  Once 05/21/21 2023 05/21/21 2029       Subjective:  Patient seen and examined the bedside this morning.  Hemodynamically stable.  She was lying in the bed as usual.  Looked comfortable.  She says she is not hungry to eat  her breakfast.  She communicates well.  Obeys commands but not oriented to time  Objective: Vitals:   06/19/21 0804 06/19/21 1111 06/19/21 2204 06/20/21 0636  BP: (!) 157/80 (!) 145/69 133/75 132/75  Pulse: 85 76 (!) 104 92  Resp: 16 18 18 16   Temp: 98.5 F (36.9 C) 98.5 F (36.9 C) 99 F (37.2 C) 99 F (37.2 C)  TempSrc:   Oral Oral  SpO2: 99% 95% 96% 95%  Weight:      Height:        Intake/Output Summary (Last 24 hours) at 06/20/2021 0736 Last data filed at 06/20/2021 0636 Gross per 24 hour  Intake 0 ml  Output 725 ml  Net -725 ml   Filed Weights   06/06/21 0500 06/07/21 0500 06/08/21 0500  Weight: 88.8 kg 88.4 kg 87.9 kg    Examination:  General exam: Weak, chronically ill looking, not in distress, obese HEENT: PERRL, clean surgical wound on the occipital region/back of head Respiratory system:  no wheezes or crackles  Cardiovascular system: S1 & S2 heard, RRR.  Chemo-Port on the right chest Gastrointestinal system: Abdomen is obese, soft and nontender. Central nervous system: Alert and awake, obeys commands, not  oriented to time Extremities: Trace bilateral lower extremity edema, lymphedema of left upper extremity Skin: Pressure ulcers as above GU: Foley  Data Reviewed: I have personally reviewed following labs and imaging studies  CBC: Recent Labs  Lab 06/13/21 1110 06/16/21 0455 06/19/21 0556  WBC  --  12.7* 10.7*  NEUTROABS  --  8.4* 7.1  HGB 10.6* 10.5* 11.0*  HCT 33.3* 33.8* 34.6*  MCV  --  101.2* 98.3  PLT  --  641* 546*   Basic Metabolic Panel: Recent Labs  Lab 06/16/21 0455  NA 144  K 3.8  CL 109  CO2 28  GLUCOSE 108*  BUN 25*  CREATININE 0.84  CALCIUM 8.2*   GFR: Estimated Creatinine Clearance: 70.6 mL/min (by C-G formula based on SCr of 0.84 mg/dL). Liver Function Tests: No results for input(s): AST, ALT, ALKPHOS, BILITOT, PROT, ALBUMIN in the last 168 hours.  No results for input(s): LIPASE, AMYLASE in the last 168 hours. No results for input(s): AMMONIA in the last 168 hours. Coagulation Profile: No results for input(s): INR, PROTIME in the last 168 hours. Cardiac Enzymes: No results for input(s): CKTOTAL, CKMB, CKMBINDEX, TROPONINI in the last 168 hours. BNP (last 3 results) No results for input(s): PROBNP in the last 8760 hours. HbA1C: No results for input(s): HGBA1C in the last 72 hours. CBG: No results for input(s): GLUCAP in the last 168 hours.  Lipid Profile: No results for input(s): CHOL, HDL, LDLCALC, TRIG, CHOLHDL, LDLDIRECT in the last 72 hours. Thyroid Function Tests: No results for input(s): TSH, T4TOTAL, FREET4, T3FREE, THYROIDAB in the last 72 hours. Anemia Panel: No results for input(s): VITAMINB12, FOLATE, FERRITIN, TIBC, IRON, RETICCTPCT in the last 72 hours. Sepsis Labs: No results for input(s): PROCALCITON, LATICACIDVEN in the last 168 hours.  No results found for this or any previous visit (from the past 240 hour(s)).       Radiology Studies: No results found.      Scheduled Meds:  amLODipine  5 mg Oral Daily   aspirin  EC  81 mg Oral Daily   Chlorhexidine Gluconate Cloth  6 each Topical Daily   feeding supplement (NEPRO CARB STEADY)  237 mL Oral TID BM   metoprolol tartrate  25 mg Oral BID   multivitamin with minerals  1 tablet Oral Daily   pantoprazole  40 mg Oral Daily   sodium hypochlorite   Topical BID   vitamin B-12  1,000 mcg Oral Daily   Continuous Infusions:   LOS: 29 days    Time spent:15 mins. More than 50% of that time was spent in counseling and/or coordination of care.      Shelly Coss, MD [facility] P11/04/2021, 7:36 AM

## 2021-06-21 DIAGNOSIS — I639 Cerebral infarction, unspecified: Secondary | ICD-10-CM | POA: Diagnosis not present

## 2021-06-21 DIAGNOSIS — K5900 Constipation, unspecified: Secondary | ICD-10-CM

## 2021-06-21 DIAGNOSIS — J9600 Acute respiratory failure, unspecified whether with hypoxia or hypercapnia: Secondary | ICD-10-CM | POA: Diagnosis not present

## 2021-06-21 DIAGNOSIS — N179 Acute kidney failure, unspecified: Secondary | ICD-10-CM | POA: Diagnosis not present

## 2021-06-21 DIAGNOSIS — G9340 Encephalopathy, unspecified: Secondary | ICD-10-CM | POA: Diagnosis not present

## 2021-06-21 MED ORDER — SENNOSIDES-DOCUSATE SODIUM 8.6-50 MG PO TABS
1.0000 | ORAL_TABLET | Freq: Two times a day (BID) | ORAL | Status: DC
  Administered 2021-06-21 – 2021-07-11 (×41): 1 via ORAL
  Filled 2021-06-21 (×40): qty 1

## 2021-06-21 MED ORDER — POLYETHYLENE GLYCOL 3350 17 G PO PACK
17.0000 g | PACK | Freq: Every day | ORAL | Status: DC
  Administered 2021-06-21 – 2021-06-25 (×5): 17 g via ORAL
  Filled 2021-06-21 (×3): qty 1

## 2021-06-21 NOTE — Progress Notes (Signed)
[facility] Hospital Liaison Note  Notified by [facility] manager of patient/family request for [facility] Palliative services at home after discharge.  [facility] hospital liaison will follow patient for discharge disposition.  Please call with any hospice or outpatient palliative care related questions.  Thank you for the opportunity to participate in this patient's care.  Bobbie "Loren Racer, [place], [place] [phone]

## 2021-06-21 NOTE — Progress Notes (Signed)
PROGRESS NOTE    Desiree Mitchell  ZTI:[id number] DOB: [birth year] DOA: 05/21/2021 PCP: Pcp, No   Chief Complain: Found unresponsive  Brief Narrative: Patient is a 65 year old female with history of breast cancer status post bilateral mastectomy, history of VA shunt  and prior subarachnoid hemorrhage/hydrocephalus, CVA with residual right-sided deficit, asthma, hypertension who presented at emergency department after she was found unresponsive by family.  On presentation, she was hypoxic, hypotensive.  She was also found to be infested with bedbugs and cockroaches.  CT head without contrast showed development of mild hydrocephalus, periventricular edema associated with malpositioned left VA shunt.  She was seen by neurosurgery and was urgently  taken to the OR for removal of her VA shunt and placement of external ventricular drain.  Hospital course also remarkable for bacteremia for which ID was following.  Patient was transferred to [facility] on 05/30/2021.  Several discussions were held with family about goals of care.  Initially she was on comfort care but since her mental status slowly improved, family wanted to discontinue comfort care,then family decided on placing her in a skilled nursing facility.   Hospice agency was also following and recommending hospice support on long-term care.  Since last several days, she has remained hemodynamically stable, overall comfortable.  Medically stable for discharge to SNF whenever possible.TOC following  11/10-no overnight issues  Assessment & Plan:   Principal Problem:   Sepsis ([place]) Active Problems:   Endotracheally intubated   On mechanically assisted ventilation (HCC)   Shunt malfunction   Rhabdomyolysis   AKI (acute kidney injury) ([place])   Increased anion gap metabolic acidosis   Acute encephalopathy   Transaminitis   Pressure injury of skin   Acute embolic stroke ([place])   Palliative care by specialist   DNR (do not resuscitate)   Severe  sepsis/Proteus/staph epidermidis septicemia: ID were following.  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  No evidence of recent infection with negative CSF cultures.  TTE unrevealing.  Unable to perform TEE due to hemodynamic abnormalities of her epiglottis.  Completed  course of ceftriaxone and linezolid.  ID signed off.   11/10 currently off antibiotics    VP shunt malfunction/history of aneurysm/stroke with right-sided deficits: Presented with confusion.  CT head showed development of periventricular edema associated with malpositioned left VA shunt.  Emergently taken to the OR on 10/11 with complete removal of ventriculoperitoneal shunt and placement of left parietal approach ventriculostomy.  External ventriculostomy drain removed by neurosurgery on 05/30/21.  CSF cultures negative.  11/10 no further intervention planned by the neurosurgery team   Acute hypoxic respiratory failure: Resolved.  Patient was on mechanical ventilation before.   11/10 on RA now   Multifocal strokes: MRI showed multifocal infarcts, concerning for embolic in nature.  Neurology was consulted.  TTE unrevealing.  Unable to undergo TEE.  She has history of right-sided residual deficits.  11/10 no further work-up recommended.  Continue aspirin   Hypertension: Continue current medications.  Continue current medications, monitor blood pressure.  On amlodipine, metoprolol  Low normal vitamin B12: Received 3 days of IM vitamin B12 supplementation.  Continue oral supplementation.  Dysphagia: Has poor oral intake.  She had declined PEG tube placement.  NG tube has been removed, tube feeding discontinued.  Speech therapy following.  On dysphagia 2 diet.  Constipation: Start bowel regimen  GERD: Continue PPI  Normocytic anemia: Continue to monitor H&H.  She was transfused with a unit of PRBC on 11/2.  Hemoglobin stable in the range of 10-11  Goals of care: Palliative care were following.   Initially she was on comfort care which has been revoked by family because her overall status improved. ToC is following for placing her on skilled nursing facility.  Palliative care recommending hospice at the skilled nursing facility.  Prognosis is poor.  Pressure Injury 05/22/21 Buttocks Right Deep Tissue Pressure Injury - Purple or maroon localized area of discolored intact skin or blood-filled blister due to damage of underlying soft tissue from pressure and/or shear. (Active)  05/22/21 0300  Location: Buttocks  Location Orientation: Right  Staging: Deep Tissue Pressure Injury - Purple or maroon localized area of discolored intact skin or blood-filled blister due to damage of underlying soft tissue from pressure and/or shear.  Wound Description (Comments):   Present on Admission: Yes     Pressure Injury 05/22/21 Coccyx Stage 2 -  Partial thickness loss of dermis presenting as a shallow open injury with a red, pink wound bed without slough. (Active)  05/22/21 0300  Location: Coccyx  Location Orientation:   Staging: Stage 2 -  Partial thickness loss of dermis presenting as a shallow open injury with a red, pink wound bed without slough.  Wound Description (Comments):   Present on Admission: Yes     Pressure Injury 05/22/21 Cervical Deep Tissue Pressure Injury - Purple or maroon localized area of discolored intact skin or blood-filled blister due to damage of underlying soft tissue from pressure and/or shear. (Active)  05/22/21 0300  Location: Cervical  Location Orientation:   Staging: Deep Tissue Pressure Injury - Purple or maroon localized area of discolored intact skin or blood-filled blister due to damage of underlying soft tissue from pressure and/or shear.  Wound Description (Comments):   Present on Admission: Yes     Pressure Injury 06/05/21 Buttocks Left Unstageable - Full thickness tissue loss in which the base of the injury is covered by slough (yellow, tan, gray, green or  brown) and/or eschar (tan, brown or black) in the wound bed. Long curved wound on whole left (Active)  06/05/21 0622  Location: Buttocks  Location Orientation: Left  Staging: Unstageable - Full thickness tissue loss in which the base of the injury is covered by slough (yellow, tan, gray, green or brown) and/or eschar (tan, brown or black) in the wound bed.  Wound Description (Comments): Long curved wound on whole left buttocks. black yellow red pink and bloody  Present on Admission:    Patient also has a Foley catheter placed for urinary retention.  She also has several pressure ulcers on the sacrum.  She will be continued on Foley catheter on discharge         Nutrition Problem: Inadequate oral intake Etiology: acute illness      DVT prophylaxis:SCD Code Status: DNR Family Communication: Called daughter for update on 06/18/21  Status is: Inpatient      Consultants: Neurosurgery, PCCM, palliative care  Procedures: As above  Antimicrobials:  Anti-infectives (From admission, onward)    Start     Dose/Rate Route Frequency Ordered Stop   05/30/21 2200  linezolid (ZYVOX) tablet 600 mg        600 mg Per Tube Every 12 hours 05/30/21 1512 06/06/21 2105   05/24/21 1800  linezolid (ZYVOX) IVPB 600 mg  Status:  Discontinued        600 mg 300 mL/hr over 60 Minutes Intravenous Every 12 hours 05/24/21 1542 05/30/21 1512   05/24/21 1000  cefTRIAXone (ROCEPHIN) 2  g in sodium chloride 0.9 % 100 mL IVPB        2 g 200 mL/hr over 30 Minutes Intravenous Every 24 hours 05/23/21 1510 05/31/21 0926   05/24/21 0000  vancomycin (VANCOCIN) IVPB 1000 mg/200 mL premix  Status:  Discontinued        1,000 mg 200 mL/hr over 60 Minutes Intravenous Every 48 hours 05/22/21 0303 05/23/21 1510   05/23/21 1000  ceFEPIme (MAXIPIME) 2 g in sodium chloride 0.9 % 100 mL IVPB        2 g 200 mL/hr over 30 Minutes Intravenous Every 12 hours 05/23/21 0937 05/23/21 2135   05/22/21 2100  ceFEPIme (MAXIPIME) 2  g in sodium chloride 0.9 % 100 mL IVPB  Status:  Discontinued        2 g 200 mL/hr over 30 Minutes Intravenous Every 24 hours 05/22/21 0304 05/23/21 0937   05/21/21 2100  vancomycin (VANCOREADY) IVPB 2000 mg/400 mL        2,000 mg 200 mL/hr over 120 Minutes Intravenous  Once 05/21/21 2029 05/22/21 0027   05/21/21 2030  ceFEPIme (MAXIPIME) 2 g in sodium chloride 0.9 % 100 mL IVPB        2 g 200 mL/hr over 30 Minutes Intravenous  Once 05/21/21 2023 05/21/21 2210   05/21/21 2030  metroNIDAZOLE (FLAGYL) IVPB 500 mg        500 mg 100 mL/hr over 60 Minutes Intravenous  Once 05/21/21 2023 05/21/21 2315   05/21/21 2030  vancomycin (VANCOCIN) IVPB 1000 mg/200 mL premix  Status:  Discontinued        1,000 mg 200 mL/hr over 60 Minutes Intravenous  Once 05/21/21 2023 05/21/21 2029       Subjective: Denies sob, cp, abd pain.  Per nursing patient appears to be constipated  Objective: Vitals:   06/20/21 1623 06/20/21 2014 06/21/21 0534 06/21/21 0836  BP: 101/75 137/80 133/77 (!) 146/74  Pulse: 71 99 83 87  Resp:  17 17 15   Temp: 98.9 F (37.2 C) 98.5 F (36.9 C) 98.4 F (36.9 C) 98.6 F (37 C)  TempSrc: Oral Oral    SpO2: 96% 95% 93% 96%  Weight:      Height:        Intake/Output Summary (Last 24 hours) at 06/21/2021 0842 Last data filed at 06/21/2021 0839 Gross per 24 hour  Intake 360 ml  Output 1275 ml  Net -915 ml   Filed Weights   06/06/21 0500 06/07/21 0500 06/08/21 0500  Weight: 88.8 kg 88.4 kg 87.9 kg    Examination: Nad, calm Cta no w/r Regular s1/s2 no gallop Soft benign +bs No edema  Data Reviewed: I have personally reviewed following labs and imaging studies  CBC: Recent Labs  Lab 06/16/21 0455 06/19/21 0556  WBC 12.7* 10.7*  NEUTROABS 8.4* 7.1  HGB 10.5* 11.0*  HCT 33.8* 34.6*  MCV 101.2* 98.3  PLT 641* 332*   Basic Metabolic Panel: Recent Labs  Lab 06/16/21 0455  NA 144  K 3.8  CL 109  CO2 28  GLUCOSE 108*  BUN 25*  CREATININE 0.84   CALCIUM 8.2*   GFR: Estimated Creatinine Clearance: 70.6 mL/min (by C-G formula based on SCr of 0.84 mg/dL). Liver Function Tests: No results for input(s): AST, ALT, ALKPHOS, BILITOT, PROT, ALBUMIN in the last 168 hours.  No results for input(s): LIPASE, AMYLASE in the last 168 hours. No results for input(s): AMMONIA in the last 168 hours. Coagulation Profile: No results for input(s):  INR, PROTIME in the last 168 hours. Cardiac Enzymes: No results for input(s): CKTOTAL, CKMB, CKMBINDEX, TROPONINI in the last 168 hours. BNP (last 3 results) No results for input(s): PROBNP in the last 8760 hours. HbA1C: No results for input(s): HGBA1C in the last 72 hours. CBG: No results for input(s): GLUCAP in the last 168 hours.  Lipid Profile: No results for input(s): CHOL, HDL, LDLCALC, TRIG, CHOLHDL, LDLDIRECT in the last 72 hours. Thyroid Function Tests: No results for input(s): TSH, T4TOTAL, FREET4, T3FREE, THYROIDAB in the last 72 hours. Anemia Panel: No results for input(s): VITAMINB12, FOLATE, FERRITIN, TIBC, IRON, RETICCTPCT in the last 72 hours. Sepsis Labs: No results for input(s): PROCALCITON, LATICACIDVEN in the last 168 hours.  Recent Results (from the past 240 hour(s))  Aerobic/Anaerobic Culture w Gram Stain (surgical/deep wound)     Status: None (Preliminary result)   Collection Time: 06/20/21  3:58 PM   Specimen: Back; Wound  Result Value Ref Range Status   Specimen Description   Final    BACK Performed at [facility], [address] [place], [place] [address]    Special Requests   Final    NONE Performed at [facility], [address] [place], [place] [address]    Gram Stain   Final    FEW WBC PRESENT,BOTH PMN AND MONONUCLEAR RARE GRAM NEGATIVE RODS RARE GRAM VARIABLE COCCI Performed at [facility], [place] [address], [place], [place] [address]    Culture PENDING  Incomplete   Report Status PENDING  Incomplete         Radiology  Studies: No results found.      Scheduled Meds:  amLODipine  5 mg Oral Daily   aspirin EC  81 mg Oral Daily   Chlorhexidine Gluconate Cloth  6 each Topical Daily   feeding supplement (NEPRO CARB STEADY)  237 mL Oral TID BM   metoprolol tartrate  25 mg Oral BID   multivitamin with minerals  1 tablet Oral Daily   pantoprazole  40 mg Oral Daily   sodium hypochlorite   Topical BID   vitamin B-12  1,000 mcg Oral Daily   Continuous Infusions:   LOS: 30 days    Time spent: 35 minutes with more than 50% on [place], MD [facility] P11/05/2021, 8:42 AM

## 2021-06-21 NOTE — TOC Progression Note (Signed)
Transition of Care [facility]) - Progression Note    Patient Details  Name: Desiree Mitchell MRN: [id number] Date of Birth: 02-Nov-1955  Transition of Care [facility]) CM/SW Contact  Shelbie Hutching, RN Phone Number: 06/21/2021, 12:56 PM  Clinical Narrative:    Jackelyn Poling with [facility] is faxing updated Clinicals to [facility] Medicare now, per Lenor Derrick misplaced or lost all the previous information she had faxed.     Expected Discharge Plan: Skilled Nursing Facility Barriers to Discharge: Insurance Authorization  Expected Discharge Plan and Services Expected Discharge Plan: [place] In-house Referral: Clinical Social Work Discharge Planning Services: CM Consult Post Acute Care Choice: [place] Living arrangements for the past 2 months: Single Family Home                 DME Arranged: N/A DME Agency: NA       HH Arranged: NA HH Agency: NA         Social Determinants of Health (SDOH) Interventions    Readmission Risk Interventions No flowsheet data found.

## 2021-06-22 DIAGNOSIS — G9340 Encephalopathy, unspecified: Secondary | ICD-10-CM | POA: Diagnosis not present

## 2021-06-22 DIAGNOSIS — N179 Acute kidney failure, unspecified: Secondary | ICD-10-CM | POA: Diagnosis not present

## 2021-06-22 DIAGNOSIS — I639 Cerebral infarction, unspecified: Secondary | ICD-10-CM | POA: Diagnosis not present

## 2021-06-22 NOTE — Progress Notes (Addendum)
Occupational Therapy Re-evaluation Patient Details Name: Desiree Mitchell MRN: [id number] DOB: [birth year] Today's Date: 06/22/2021   History of present illness Pt is a 65 y.o. female presenting to hospital 05/21/21 with AMS (found unresponsive by family).  Significant pressure sore present back of neck, buttocks, and coccyx.  Pt noted to be infested with bed bugs and roaches.  Pt admitted with acute respiratory failure, rhabdomyolysis, metabolic acidosis, acute kidney injury, sepsis, VA shunt malfunction, transaminitis, and posterior neck/buttocks/coccyx pressure injuries.  S/p removal ventriculoatrial shunt and placement of L parietal approach ventriculostomy 05/22/21; extubated 10/11; MRI brain 10/14 showing few scattered small acute infarcts involving different vascular territories (watershed type infarcts); TEE 10/18; and EVD removed 10/19.    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   OT comments  Pt seen with PT for skilled co-tx. Pt remains emotionally labile during session but agreeable with encouragement. Pt performed bed mobility with mod A of 2 to EOB. Pt needing min A for static sitting on EOB. Pt needing mod A to change hospital gown while seated on EOB. Pt standing with use of step on floor for safety. Max A of two with B knees blocked. Pt initiates stand x 2 reps with manual facilitation for upright position. Pt returned to bed secondary to fatigue with max A of 2. Pt needing set up A to open containers and it placed in L hand to hold while pt feeds self magic cup with R UE and minimal spillage. All needs within reach as therapist left the room. Pt continues to benefit from OT intervention.    Recommendations for follow up therapy are one component of a multi-disciplinary discharge planning process, led by the attending physician.  Recommendations may be updated based on patient status, additional functional criteria  and insurance authorization.    Follow Up Recommendations  Skilled nursing-short term rehab (<3 hours/day)    Assistance Recommended at Discharge Frequent or constant Supervision/Assistance  Equipment Recommendations  Other (comment) (defer to next venue of care)       Precautions / Restrictions Precautions Precautions: Fall Precaution Comments: Aspiration; gastric tube; R chest port;  buttocks, and coccyx pressure injuries       Mobility Bed Mobility Overal bed mobility: Needs Assistance Bed Mobility: Supine to Sit;Sit to Supine     Supine to sit: +2 for physical assistance;Mod assist Sit to supine: Max assist;+2 for physical assistance   General bed mobility comments: Requires trunk and BLE support    Transfers Overall transfer level: Needs assistance Equipment used: 2 person hand held assist Transfers: Sit to/from Stand Sit to Stand: Max assist;+2 physical assistance           General transfer comment: B knees blocked and assistance to manually facilitate upright posture     Balance Overall balance assessment: Needs assistance Sitting-balance support: Bilateral upper extremity supported;Feet supported Sitting balance-Leahy Scale: Fair Sitting balance - Comments: fatigues quickly and needs cuing and min A for static sitting balance Postural control: Posterior lean Standing balance support: Bilateral upper extremity supported Standing balance-Leahy Scale: Zero Standing balance comment: not assessed due to weakness from sitting upright.                           ADL either performed or assessed with clinical judgement   ADL Overall ADL's : Needs assistance/impaired  Upper Body Dressing : Sitting;Moderate assistance Upper Body Dressing Details (indicate cue type and reason): to don hospital gown                        Extremity/Trunk Assessment Upper Extremity Assessment Upper Extremity Assessment: Generalized  weakness   Lower Extremity Assessment Lower Extremity Assessment: Generalized weakness        Vision Patient Visual Report: No change from baseline            Cognition Arousal/Alertness: Awake/alert Behavior During Therapy: WFL for tasks assessed/performed Overall Cognitive Status: No family/caregiver present to determine baseline cognitive functioning                                 General Comments: Increased time for task processing                     Pertinent Vitals/ Pain       Pain Assessment: Faces Faces Pain Scale: Hurts little more Pain Location: Sacrum, back, feet Pain Descriptors / Indicators: Discomfort;Grimacing;Sore Pain Intervention(s): Limited activity within patient's tolerance;Monitored during session;Repositioned         Frequency  Min 2X/week        Progress Toward Goals  OT Goals(current goals can now be found in the care plan section)  Progress towards OT goals: Progressing toward goals  Acute Rehab OT Goals Patient Stated Goal: to go tome OT Goal Formulation: With patient Time For Goal Achievement: 06/25/21 Potential to Achieve Goals: [place] Discharge plan remains appropriate;Frequency remains appropriate    Co-evaluation      Reason for Co-Treatment: Complexity of the patient's impairments (multi-system involvement);Necessary to address cognition/behavior during functional activity;To address functional/ADL transfers;For patient/therapist safety PT goals addressed during session: Mobility/safety with mobility OT goals addressed during session: ADL's and self-care      AM-PAC OT "6 Clicks" Daily Activity     Outcome Measure     Help from another person taking care of personal grooming?: A Little Help from another person toileting, which includes using toliet, bedpan, or urinal?: Total Help from another person bathing (including washing, rinsing, drying)?: A Lot Help from another person to put on and  taking off regular upper body clothing?: A Lot Help from another person to put on and taking off regular lower body clothing?: Total 6 Click Score: 9    End of Session    OT Visit Diagnosis: Muscle weakness (generalized) (M62.81);Adult, failure to thrive (R62.7);Other symptoms and signs involving cognitive function   Activity Tolerance Patient limited by fatigue   Patient Left in bed;with call bell/phone within reach;with bed alarm set   Nurse Communication Mobility status        Time: 1420-1447 OT Time Calculation (min): 27 min  Charges: OT General Charges $OT Visit: 1 Visit OT Evaluation $OT Re-eval: 1 Re-eval OT Treatments $Therapeutic Activity: 8-22 mins  Darleen Crocker, MS, OTR/L , CBIS ascom [phone]  06/22/21, 3:10 PM

## 2021-06-22 NOTE — Progress Notes (Signed)
PROGRESS NOTE    Desiree Mitchell  DGL:[id number] DOB: 1955/09/15 DOA: 05/21/2021 PCP: Pcp, No   Chief Complain: Found unresponsive  Brief Narrative: Patient is a 65 year old female with history of breast cancer status post bilateral mastectomy, history of VA shunt  and prior subarachnoid hemorrhage/hydrocephalus, CVA with residual right-sided deficit, asthma, hypertension who presented at emergency department after she was found unresponsive by family.  On presentation, she was hypoxic, hypotensive.  She was also found to be infested with bedbugs and cockroaches.  CT head without contrast showed development of mild hydrocephalus, periventricular edema associated with malpositioned left VA shunt.  She was seen by neurosurgery and was urgently  taken to the OR for removal of her VA shunt and placement of external ventricular drain.  Hospital course also remarkable for bacteremia for which ID was following.  Patient was transferred to [facility] on 05/30/2021.  Several discussions were held with family about goals of care.  Initially she was on comfort care but since her mental status slowly improved, family wanted to discontinue comfort care,then family decided on placing her in a skilled nursing facility.   Hospice agency was also following and recommending hospice support on long-term care.  Since last several days, she has remained hemodynamically stable, overall comfortable.  Medically stable for discharge to SNF whenever possible.TOC following  11/11 no overnight issues   Assessment & Plan:   Principal Problem:   Sepsis ([place]) Active Problems:   Endotracheally intubated   On mechanically assisted ventilation (HCC)   Shunt malfunction   Rhabdomyolysis   AKI (acute kidney injury) ([place])   Increased anion gap metabolic acidosis   Acute encephalopathy   Transaminitis   Pressure injury of skin   Acute embolic stroke ([place])   Palliative care by specialist   DNR (do not resuscitate)   Severe  sepsis/Proteus/staph epidermidis septicemia: ID were following.  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  No evidence of recent infection with negative CSF cultures.  TTE unrevealing.  Unable to perform TEE due to hemodynamic abnormalities of her epiglottis.  Completed  course of ceftriaxone and linezolid.  ID signed off.   11/11 currently off antibiotics     VP shunt malfunction/history of aneurysm/stroke with right-sided deficits: Presented with confusion.  CT head showed development of periventricular edema associated with malpositioned left VA shunt.  Emergently taken to the OR on 10/11 with complete removal of ventriculoperitoneal shunt and placement of left parietal approach ventriculostomy.  External ventriculostomy drain removed by neurosurgery on 05/30/21.  CSF cultures negative.  11/11 no further intervention planned by neurosurgery team     Acute hypoxic respiratory failure: Resolved.  Patient was on mechanical ventilation before.   11/11 on room air now   Multifocal strokes: MRI showed multifocal infarcts, concerning for embolic in nature.  Neurology was consulted.  TTE unrevealing.  Unable to undergo TEE.  She has history of right-sided residual deficits.  11/11 no further work-up recommended.  Continue aspirin     Hypertension: Continue current medications.  Continue current medications, monitor blood pressure.  On amlodipine, metoprolol  Low normal vitamin B12: Received 3 days of IM vitamin B12 supplementation.  Continue oral supplementation.  Dysphagia: Has poor oral intake.  She had declined PEG tube placement.  NG tube has been removed, tube feeding discontinued.  Speech therapy following.  On dysphagia 2 diet.  Constipation: Start bowel regimen  GERD: Continue PPI  Normocytic anemia: Continue to monitor H&H.  She was transfused with  a unit of PRBC on 11/2.  Hemoglobin stable in the range of 10-11  Goals of care: Palliative care were  following.  Initially she was on comfort care which has been revoked by family because her overall status improved. ToC is following for placing her on skilled nursing facility.  Palliative care recommending hospice at the skilled nursing facility.  Prognosis is poor.  Pressure Injury 05/22/21 Buttocks Right Deep Tissue Pressure Injury - Purple or maroon localized area of discolored intact skin or blood-filled blister due to damage of underlying soft tissue from pressure and/or shear. (Active)  05/22/21 0300  Location: Buttocks  Location Orientation: Right  Staging: Deep Tissue Pressure Injury - Purple or maroon localized area of discolored intact skin or blood-filled blister due to damage of underlying soft tissue from pressure and/or shear.  Wound Description (Comments):   Present on Admission: Yes     Pressure Injury 05/22/21 Coccyx Stage 2 -  Partial thickness loss of dermis presenting as a shallow open injury with a red, pink wound bed without slough. (Active)  05/22/21 0300  Location: Coccyx  Location Orientation:   Staging: Stage 2 -  Partial thickness loss of dermis presenting as a shallow open injury with a red, pink wound bed without slough.  Wound Description (Comments):   Present on Admission: Yes     Pressure Injury 05/22/21 Cervical Deep Tissue Pressure Injury - Purple or maroon localized area of discolored intact skin or blood-filled blister due to damage of underlying soft tissue from pressure and/or shear. (Active)  05/22/21 0300  Location: Cervical  Location Orientation:   Staging: Deep Tissue Pressure Injury - Purple or maroon localized area of discolored intact skin or blood-filled blister due to damage of underlying soft tissue from pressure and/or shear.  Wound Description (Comments):   Present on Admission: Yes     Pressure Injury 06/05/21 Buttocks Left Unstageable - Full thickness tissue loss in which the base of the injury is covered by slough (yellow, tan, gray,  green or brown) and/or eschar (tan, brown or black) in the wound bed. Long curved wound on whole left (Active)  06/05/21 0622  Location: Buttocks  Location Orientation: Left  Staging: Unstageable - Full thickness tissue loss in which the base of the injury is covered by slough (yellow, tan, gray, green or brown) and/or eschar (tan, brown or black) in the wound bed.  Wound Description (Comments): Long curved wound on whole left buttocks. black yellow red pink and bloody  Present on Admission:    Patient also has a Foley catheter placed for urinary retention.  She also has several pressure ulcers on the sacrum.  She will be continued on Foley catheter on discharge         Nutrition Problem: Inadequate oral intake Etiology: acute illness      DVT prophylaxis:SCD Code Status: DNR Family Communication: Status is: Inpatient.  Unsafe discharge      Consultants: Neurosurgery, PCCM, palliative care  Procedures: As above  Antimicrobials:  Anti-infectives (From admission, onward)    Start     Dose/Rate Route Frequency Ordered Stop   05/30/21 2200  linezolid (ZYVOX) tablet 600 mg        600 mg Per Tube Every 12 hours 05/30/21 1512 06/06/21 2105   05/24/21 1800  linezolid (ZYVOX) IVPB 600 mg  Status:  Discontinued        600 mg 300 mL/hr over 60 Minutes Intravenous Every 12 hours 05/24/21 1542 05/30/21 1512   05/24/21 1000  cefTRIAXone (ROCEPHIN) 2 g in sodium chloride 0.9 % 100 mL IVPB        2 g 200 mL/hr over 30 Minutes Intravenous Every 24 hours 05/23/21 1510 05/31/21 0926   05/24/21 0000  vancomycin (VANCOCIN) IVPB 1000 mg/200 mL premix  Status:  Discontinued        1,000 mg 200 mL/hr over 60 Minutes Intravenous Every 48 hours 05/22/21 0303 05/23/21 1510   05/23/21 1000  ceFEPIme (MAXIPIME) 2 g in sodium chloride 0.9 % 100 mL IVPB        2 g 200 mL/hr over 30 Minutes Intravenous Every 12 hours 05/23/21 0937 05/23/21 2135   05/22/21 2100  ceFEPIme (MAXIPIME) 2 g in sodium  chloride 0.9 % 100 mL IVPB  Status:  Discontinued        2 g 200 mL/hr over 30 Minutes Intravenous Every 24 hours 05/22/21 0304 05/23/21 0937   05/21/21 2100  vancomycin (VANCOREADY) IVPB 2000 mg/400 mL        2,000 mg 200 mL/hr over 120 Minutes Intravenous  Once 05/21/21 2029 05/22/21 0027   05/21/21 2030  ceFEPIme (MAXIPIME) 2 g in sodium chloride 0.9 % 100 mL IVPB        2 g 200 mL/hr over 30 Minutes Intravenous  Once 05/21/21 2023 05/21/21 2210   05/21/21 2030  metroNIDAZOLE (FLAGYL) IVPB 500 mg        500 mg 100 mL/hr over 60 Minutes Intravenous  Once 05/21/21 2023 05/21/21 2315   05/21/21 2030  vancomycin (VANCOCIN) IVPB 1000 mg/200 mL premix  Status:  Discontinued        1,000 mg 200 mL/hr over 60 Minutes Intravenous  Once 05/21/21 2023 05/21/21 2029       Subjective: No sob, cp, or abd pain  Objective: Vitals:   06/21/21 2334 06/22/21 0415 06/22/21 0500 06/22/21 0746  BP: 137/73 (!) 154/76  129/80  Pulse: 89 89  89  Resp: 16 16  16   Temp: 98.7 F (37.1 C) 98.7 F (37.1 C)  98 F (36.7 C)  TempSrc: Oral Oral  Oral  SpO2: 96% 96%  94%  Weight:   101.5 kg   Height:        Intake/Output Summary (Last 24 hours) at 06/22/2021 0823 Last data filed at 06/22/2021 5176 Gross per 24 hour  Intake 120 ml  Output 1475 ml  Net -1355 ml   Filed Weights   06/07/21 0500 06/08/21 0500 06/22/21 0500  Weight: 88.4 kg 87.9 kg 101.5 kg    Examination: NAD, calm CTA no wheeze Regular S1-S2 no gallops Soft benign positive bowel sounds No edema  Data Reviewed: I have personally reviewed following labs and imaging studies  CBC: Recent Labs  Lab 06/16/21 0455 06/19/21 0556  WBC 12.7* 10.7*  NEUTROABS 8.4* 7.1  HGB 10.5* 11.0*  HCT 33.8* 34.6*  MCV 101.2* 98.3  PLT 641* 160*   Basic Metabolic Panel: Recent Labs  Lab 06/16/21 0455  NA 144  K 3.8  CL 109  CO2 28  GLUCOSE 108*  BUN 25*  CREATININE 0.84  CALCIUM 8.2*   GFR: Estimated Creatinine Clearance:  76.5 mL/min (by C-G formula based on SCr of 0.84 mg/dL). Liver Function Tests: No results for input(s): AST, ALT, ALKPHOS, BILITOT, PROT, ALBUMIN in the last 168 hours.  No results for input(s): LIPASE, AMYLASE in the last 168 hours. No results for input(s): AMMONIA in the last 168 hours. Coagulation Profile: No results for input(s): INR, PROTIME in the  last 168 hours. Cardiac Enzymes: No results for input(s): CKTOTAL, CKMB, CKMBINDEX, TROPONINI in the last 168 hours. BNP (last 3 results) No results for input(s): PROBNP in the last 8760 hours. HbA1C: No results for input(s): HGBA1C in the last 72 hours. CBG: No results for input(s): GLUCAP in the last 168 hours.  Lipid Profile: No results for input(s): CHOL, HDL, LDLCALC, TRIG, CHOLHDL, LDLDIRECT in the last 72 hours. Thyroid Function Tests: No results for input(s): TSH, T4TOTAL, FREET4, T3FREE, THYROIDAB in the last 72 hours. Anemia Panel: No results for input(s): VITAMINB12, FOLATE, FERRITIN, TIBC, IRON, RETICCTPCT in the last 72 hours. Sepsis Labs: No results for input(s): PROCALCITON, LATICACIDVEN in the last 168 hours.  Recent Results (from the past 240 hour(s))  Aerobic/Anaerobic Culture w Gram Stain (surgical/deep wound)     Status: None (Preliminary result)   Collection Time: 06/20/21  3:58 PM   Specimen: Back; Wound  Result Value Ref Range Status   Specimen Description   Final    BACK Performed at [facility], [address] [place], [place] [address]    Special Requests   Final    NONE Performed at [facility], [place], [place] [address]    Gram Stain   Final    FEW WBC PRESENT,BOTH PMN AND MONONUCLEAR RARE GRAM NEGATIVE RODS RARE GRAM VARIABLE COCCI    Culture   Final    TOO YOUNG TO READ Performed at [facility], [place] [address] [place], [place] [address]    Report Status PENDING  Incomplete         Radiology Studies: No results  found.      Scheduled Meds:  amLODipine  5 mg Oral Daily   aspirin EC  81 mg Oral Daily   Chlorhexidine Gluconate Cloth  6 each Topical Daily   feeding supplement (NEPRO CARB STEADY)  237 mL Oral TID BM   metoprolol tartrate  25 mg Oral BID   multivitamin with minerals  1 tablet Oral Daily   pantoprazole  40 mg Oral Daily   polyethylene glycol  17 g Oral Daily   senna-docusate  1 tablet Oral BID   sodium hypochlorite   Topical BID   vitamin B-12  1,000 mcg Oral Daily   Continuous Infusions:   LOS: 31 days    Time spent: 77minutes with more than 50% on [place], MD [facility] P11/06/2021, 8:23 AM

## 2021-06-22 NOTE — Progress Notes (Signed)
Physical Therapy Treatment Patient Details Name: Desiree Mitchell MRN: [id number] DOB: [birth year] Today's Date: 06/22/2021   History of Present Illness Pt is a 65 y.o. female presenting to hospital 05/21/21 with AMS (found unresponsive by family).  Significant pressure sore present back of neck, buttocks, and coccyx.  Pt noted to be infested with bed bugs and roaches.  Pt admitted with acute respiratory failure, rhabdomyolysis, metabolic acidosis, acute kidney injury, sepsis, VA shunt malfunction, transaminitis, and posterior neck/buttocks/coccyx pressure injuries.  S/p removal ventriculoatrial shunt and placement of L parietal approach ventriculostomy 05/22/21; extubated 10/11; MRI brain 10/14 showing few scattered small acute infarcts involving different vascular territories (watershed type infarcts); TEE 10/18; and EVD removed 10/19.    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    PT Comments    Pt alert, states overall feeling rather miserable w/ sullen mood. Pt agreeable to session with encouragement. Progressed to standing activity MAX-A +2 physical assist, pt able to maintain upright stnading ~ 15 seconds with facilitation. Pt demonstrates much improved seated balance, requiring intermittent tactile assist due to posterior lean but maintain position for majority of session. Skilled PT intervention is indicated to address deficits in function, mobility, and to return to PLOF as able.     Recommendations for follow up therapy are one component of a multi-disciplinary discharge planning process, led by the attending physician.  Recommendations may be updated based on patient status, additional functional criteria and insurance authorization.  Follow Up Recommendations  Skilled nursing-short term rehab (<3 hours/day)     Assistance Recommended at Discharge Frequent or constant Supervision/Assistance  Equipment  Recommendations  None recommended by PT    Recommendations for Other Services       Precautions / Restrictions Precautions Precautions: Fall Precaution Comments: Aspiration; gastric tube; R chest port;  buttocks pressure injuries Restrictions Other Position/Activity Restrictions: Per MD Posey Pronto 06/01/21 pt cleared for therapy and OOB mobility (not on bedrest anymore)     Mobility  Bed Mobility Overal bed mobility: Needs Assistance Bed Mobility: Supine to Sit;Sit to Supine     Supine to sit: +2 for physical assistance;Mod assist Sit to supine: Max assist;+2 for physical assistance   General bed mobility comments: Requires trunk and BLE support    Transfers Overall transfer level: Needs assistance Equipment used: 2 person hand held assist Transfers: Sit to/from Stand Sit to Stand: Max assist;+2 physical assistance           General transfer comment: Bilat knees blocked, able to clear bottom with manual facilitation of upright posture    Ambulation/Gait               General Gait Details: deferred   Stairs             Wheelchair Mobility    Modified Rankin (Stroke Patients Only)       Balance Overall balance assessment: Needs assistance Sitting-balance support: Bilateral upper extremity supported;Feet supported Sitting balance-Leahy Scale: Fair Sitting balance - Comments: able to maintain balance during session but posterior lean w/ fatigue Postural control: Posterior lean Standing balance support: Bilateral upper extremity supported Standing balance-Leahy Scale: Zero Standing balance comment: not assessed due to weakness from sitting upright.                            Cognition Arousal/Alertness: Awake/alert Behavior During Therapy: WFL for tasks assessed/performed Overall Cognitive Status: No family/caregiver  present to determine baseline cognitive functioning                                 General Comments:  Increased time for task processing, sullen mood but participates w/ extensive encouragement        Exercises Other Exercises Other Exercises: Tactile assist for encouragement, pt able to don R sock in long seated position Other Exercises: Seated activities included dynamic reaching, reaches wtih RUE but continues to indicate inability to use LUE or raise against gravity    General Comments        Pertinent Vitals/Pain Pain Assessment: Faces Faces Pain Scale: Hurts little more Pain Location: R knee Pain Descriptors / Indicators: Discomfort;Grimacing Pain Intervention(s): Limited activity within patient's tolerance;Monitored during session;Repositioned    Home Living                          Prior Function            PT Goals (current goals can now be found in the care plan section) Progress towards PT goals: Progressing toward goals    Frequency    Min 2X/week      PT Plan Current plan remains appropriate    Co-evaluation PT/OT/SLP Co-Evaluation/Treatment: Yes Reason for Co-Treatment: Complexity of the patient's impairments (multi-system involvement);Necessary to address cognition/behavior during functional activity;To address functional/ADL transfers;For patient/therapist safety PT goals addressed during session: Mobility/safety with mobility OT goals addressed during session: ADL's and self-care      AM-PAC PT "6 Clicks" Mobility   Outcome Measure  Help needed turning from your back to your side while in a flat bed without using bedrails?: A Lot Help needed moving from lying on your back to sitting on the side of a flat bed without using bedrails?: A Lot Help needed moving to and from a bed to a chair (including a wheelchair)?: Total Help needed standing up from a chair using your arms (e.g., wheelchair or bedside chair)?: A Lot Help needed to walk in hospital room?: Total Help needed climbing 3-5 steps with a railing? : Total 6 Click Score: 9     End of Session   Activity Tolerance: Patient tolerated treatment well Patient left: in bed;with call bell/phone within reach Nurse Communication: Mobility status PT Visit Diagnosis: Other abnormalities of gait and mobility (R26.89);Muscle weakness (generalized) (M62.81);Difficulty in walking, not elsewhere classified (R26.2)     Time: 5573-2202 PT Time Calculation (min) (ACUTE ONLY): 25 min  Charges:                        [employer], SPT

## 2021-06-23 DIAGNOSIS — I639 Cerebral infarction, unspecified: Secondary | ICD-10-CM | POA: Diagnosis not present

## 2021-06-23 DIAGNOSIS — A419 Sepsis, unspecified organism: Secondary | ICD-10-CM | POA: Diagnosis not present

## 2021-06-23 DIAGNOSIS — G9340 Encephalopathy, unspecified: Secondary | ICD-10-CM | POA: Diagnosis not present

## 2021-06-23 DIAGNOSIS — N179 Acute kidney failure, unspecified: Secondary | ICD-10-CM | POA: Diagnosis not present

## 2021-06-23 NOTE — Progress Notes (Signed)
PROGRESS NOTE    Desiree Mitchell  HYI:[id number] DOB: [birth year] DOA: 05/21/2021 PCP: Pcp, No   Chief Complain: Found unresponsive  Brief Narrative: Patient is a 65 year old female with history of breast cancer status post bilateral mastectomy, history of VA shunt  and prior subarachnoid hemorrhage/hydrocephalus, CVA with residual right-sided deficit, asthma, hypertension who presented at emergency department after she was found unresponsive by family.  On presentation, she was hypoxic, hypotensive.  She was also found to be infested with bedbugs and cockroaches.  CT head without contrast showed development of mild hydrocephalus, periventricular edema associated with malpositioned left VA shunt.  She was seen by neurosurgery and was urgently  taken to the OR for removal of her VA shunt and placement of external ventricular drain.  Hospital course also remarkable for bacteremia for which ID was following.  Patient was transferred to [facility] on 05/30/2021.  Several discussions were held with family about goals of care.  Initially she was on comfort care but since her mental status slowly improved, family wanted to discontinue comfort care,then family decided on placing her in a skilled nursing facility.   Hospice agency was also following and recommending hospice support on long-term care.  Since last several days, she has remained hemodynamically stable, overall comfortable.  Medically stable for discharge to SNF whenever possible.TOC following  11/12 no overnight issues   Assessment & Plan:   Principal Problem:   Sepsis ([place]) Active Problems:   Endotracheally intubated   On mechanically assisted ventilation (HCC)   Shunt malfunction   Rhabdomyolysis   AKI (acute kidney injury) ([place])   Increased anion gap metabolic acidosis   Acute encephalopathy   Transaminitis   Pressure injury of skin   Acute embolic stroke ([place])   Palliative care by specialist   DNR (do not resuscitate)   Severe  sepsis/Proteus/staph epidermidis septicemia: ID were following.  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  No evidence of recent infection with negative CSF cultures.  TTE unrevealing.  Unable to perform TEE due to hemodynamic abnormalities of her epiglottis.  Completed  course of ceftriaxone and linezolid.  ID signed off.   11/12 currently off antibiotics   VP shunt malfunction/history of aneurysm/stroke with right-sided deficits: Presented with confusion.  CT head showed development of periventricular edema associated with malpositioned left VA shunt.  Emergently taken to the OR on 10/11 with complete removal of ventriculoperitoneal shunt and placement of left parietal approach ventriculostomy.  External ventriculostomy drain removed by neurosurgery on 05/30/21.  CSF cultures negative.  11/12 no further intervention planned by neurosurgery team      Acute hypoxic respiratory failure: Resolved.  Patient was on mechanical ventilation before.   11/12 currently on room air   Multifocal strokes: MRI showed multifocal infarcts, concerning for embolic in nature.  Neurology was consulted.  TTE unrevealing.  Unable to undergo TEE.  She has history of right-sided residual deficits.  11/12 no further work-up recommended.  Continue aspirin      Hypertension: Continue current medications.  Continue current medications, monitor blood pressure.  On amlodipine, metoprolol  Low normal vitamin B12: Received 3 days of IM vitamin B12 supplementation.  Continue oral supplementation.  Dysphagia: Has poor oral intake.  She had declined PEG tube placement.  NG tube has been removed, tube feeding discontinued.  Speech therapy following.  On dysphagia 2 diet.  Constipation: Start bowel regimen  GERD: Continue PPI  Normocytic anemia: Continue to monitor H&H.  She was transfused with  a unit of PRBC on 11/2.  Hemoglobin stable in the range of 10-11  Goals of care: Palliative care were  following.  Initially she was on comfort care which has been revoked by family because her overall status improved. ToC is following for placing her on skilled nursing facility.  Palliative care recommending hospice at the skilled nursing facility.  Prognosis is poor.  Pressure Injury 05/22/21 Buttocks Right Deep Tissue Pressure Injury - Purple or maroon localized area of discolored intact skin or blood-filled blister due to damage of underlying soft tissue from pressure and/or shear. (Active)  05/22/21 0300  Location: Buttocks  Location Orientation: Right  Staging: Deep Tissue Pressure Injury - Purple or maroon localized area of discolored intact skin or blood-filled blister due to damage of underlying soft tissue from pressure and/or shear.  Wound Description (Comments):   Present on Admission: Yes     Pressure Injury 05/22/21 Coccyx Stage 2 -  Partial thickness loss of dermis presenting as a shallow open injury with a red, pink wound bed without slough. (Active)  05/22/21 0300  Location: Coccyx  Location Orientation:   Staging: Stage 2 -  Partial thickness loss of dermis presenting as a shallow open injury with a red, pink wound bed without slough.  Wound Description (Comments):   Present on Admission: Yes     Pressure Injury 05/22/21 Cervical Deep Tissue Pressure Injury - Purple or maroon localized area of discolored intact skin or blood-filled blister due to damage of underlying soft tissue from pressure and/or shear. (Active)  05/22/21 0300  Location: Cervical  Location Orientation:   Staging: Deep Tissue Pressure Injury - Purple or maroon localized area of discolored intact skin or blood-filled blister due to damage of underlying soft tissue from pressure and/or shear.  Wound Description (Comments):   Present on Admission: Yes     Pressure Injury 06/05/21 Buttocks Left Unstageable - Full thickness tissue loss in which the base of the injury is covered by slough (yellow, tan, gray,  green or brown) and/or eschar (tan, brown or black) in the wound bed. Long curved wound on whole left (Active)  06/05/21 0622  Location: Buttocks  Location Orientation: Left  Staging: Unstageable - Full thickness tissue loss in which the base of the injury is covered by slough (yellow, tan, gray, green or brown) and/or eschar (tan, brown or black) in the wound bed.  Wound Description (Comments): Long curved wound on whole left buttocks. black yellow red pink and bloody  Present on Admission:    Patient also has a Foley catheter placed for urinary retention.  She also has several pressure ulcers on the sacrum.  She will be continued on Foley catheter on discharge         Nutrition Problem: Inadequate oral intake Etiology: acute illness      DVT prophylaxis:SCD Code Status: DNR Family Communication: Status is: Inpatient.  Unsafe discharge      Consultants: Neurosurgery, PCCM, palliative care  Procedures: As above  Antimicrobials:  Anti-infectives (From admission, onward)    Start     Dose/Rate Route Frequency Ordered Stop   05/30/21 2200  linezolid (ZYVOX) tablet 600 mg        600 mg Per Tube Every 12 hours 05/30/21 1512 06/06/21 2105   05/24/21 1800  linezolid (ZYVOX) IVPB 600 mg  Status:  Discontinued        600 mg 300 mL/hr over 60 Minutes Intravenous Every 12 hours 05/24/21 1542 05/30/21 1512   05/24/21 1000  cefTRIAXone (ROCEPHIN) 2 g in sodium chloride 0.9 % 100 mL IVPB        2 g 200 mL/hr over 30 Minutes Intravenous Every 24 hours 05/23/21 1510 05/31/21 0926   05/24/21 0000  vancomycin (VANCOCIN) IVPB 1000 mg/200 mL premix  Status:  Discontinued        1,000 mg 200 mL/hr over 60 Minutes Intravenous Every 48 hours 05/22/21 0303 05/23/21 1510   05/23/21 1000  ceFEPIme (MAXIPIME) 2 g in sodium chloride 0.9 % 100 mL IVPB        2 g 200 mL/hr over 30 Minutes Intravenous Every 12 hours 05/23/21 0937 05/23/21 2135   05/22/21 2100  ceFEPIme (MAXIPIME) 2 g in sodium  chloride 0.9 % 100 mL IVPB  Status:  Discontinued        2 g 200 mL/hr over 30 Minutes Intravenous Every 24 hours 05/22/21 0304 05/23/21 0937   05/21/21 2100  vancomycin (VANCOREADY) IVPB 2000 mg/400 mL        2,000 mg 200 mL/hr over 120 Minutes Intravenous  Once 05/21/21 2029 05/22/21 0027   05/21/21 2030  ceFEPIme (MAXIPIME) 2 g in sodium chloride 0.9 % 100 mL IVPB        2 g 200 mL/hr over 30 Minutes Intravenous  Once 05/21/21 2023 05/21/21 2210   05/21/21 2030  metroNIDAZOLE (FLAGYL) IVPB 500 mg        500 mg 100 mL/hr over 60 Minutes Intravenous  Once 05/21/21 2023 05/21/21 2315   05/21/21 2030  vancomycin (VANCOCIN) IVPB 1000 mg/200 mL premix  Status:  Discontinued        1,000 mg 200 mL/hr over 60 Minutes Intravenous  Once 05/21/21 2023 05/21/21 2029       Subjective: Patient feels full as she ate breakfast today.  No other complaint Objective: Vitals:   06/22/21 2100 06/22/21 2342 06/23/21 0428 06/23/21 0500  BP: (!) 169/86 140/72 (!) 151/87   Pulse: (!) 110 90 91   Resp: 20 15 18    Temp: 99.2 F (37.3 C) 98.8 F (37.1 C) 98.9 F (37.2 C)   TempSrc: Oral Oral Oral   SpO2: 96% 93% 95%   Weight:    103.5 kg  Height:        Intake/Output Summary (Last 24 hours) at 06/23/2021 0825 Last data filed at 06/22/2021 1800 Gross per 24 hour  Intake --  Output 425 ml  Net -425 ml   Filed Weights   06/08/21 0500 06/22/21 0500 06/23/21 0500  Weight: 87.9 kg 101.5 kg 103.5 kg    Examination: Nad, calm Cta no w/r Regular s1/s2 no gallop Soft benign +bs No edema Awake and alert   Data Reviewed: I have personally reviewed following labs and imaging studies  CBC: Recent Labs  Lab 06/19/21 0556  WBC 10.7*  NEUTROABS 7.1  HGB 11.0*  HCT 34.6*  MCV 98.3  PLT 132*   Basic Metabolic Panel: No results for input(s): NA, K, CL, CO2, GLUCOSE, BUN, CREATININE, CALCIUM, MG, PHOS in the last 168 hours.  GFR: Estimated Creatinine Clearance: 77.4 mL/min (by C-G  formula based on SCr of 0.84 mg/dL). Liver Function Tests: No results for input(s): AST, ALT, ALKPHOS, BILITOT, PROT, ALBUMIN in the last 168 hours.  No results for input(s): LIPASE, AMYLASE in the last 168 hours. No results for input(s): AMMONIA in the last 168 hours. Coagulation Profile: No results for input(s): INR, PROTIME in the last 168 hours. Cardiac Enzymes: No results for input(s): CKTOTAL, CKMB, CKMBINDEX,  TROPONINI in the last 168 hours. BNP (last 3 results) No results for input(s): PROBNP in the last 8760 hours. HbA1C: No results for input(s): HGBA1C in the last 72 hours. CBG: No results for input(s): GLUCAP in the last 168 hours.  Lipid Profile: No results for input(s): CHOL, HDL, LDLCALC, TRIG, CHOLHDL, LDLDIRECT in the last 72 hours. Thyroid Function Tests: No results for input(s): TSH, T4TOTAL, FREET4, T3FREE, THYROIDAB in the last 72 hours. Anemia Panel: No results for input(s): VITAMINB12, FOLATE, FERRITIN, TIBC, IRON, RETICCTPCT in the last 72 hours. Sepsis Labs: No results for input(s): PROCALCITON, LATICACIDVEN in the last 168 hours.  Recent Results (from the past 240 hour(s))  Aerobic/Anaerobic Culture w Gram Stain (surgical/deep wound)     Status: None (Preliminary result)   Collection Time: 06/20/21  3:58 PM   Specimen: Back; Wound  Result Value Ref Range Status   Specimen Description   Final    BACK Performed at [facility], [address], [place], [place] [address]    Special Requests   Final    NONE Performed at [facility], [address] [place], [place] [address]    Gram Stain   Final    FEW WBC PRESENT,BOTH PMN AND MONONUCLEAR RARE GRAM NEGATIVE RODS RARE GRAM VARIABLE COCCI Performed at [facility], [place] [address], [place], [place] [address]    Culture   Final    CULTURE REINCUBATED FOR BETTER GROWTH NO ANAEROBES ISOLATED; CULTURE IN PROGRESS FOR 5 DAYS    Report Status PENDING  Incomplete          Radiology Studies: No results found.      Scheduled Meds:  amLODipine  5 mg Oral Daily   aspirin EC  81 mg Oral Daily   Chlorhexidine Gluconate Cloth  6 each Topical Daily   feeding supplement (NEPRO CARB STEADY)  237 mL Oral TID BM   metoprolol tartrate  25 mg Oral BID   multivitamin with minerals  1 tablet Oral Daily   pantoprazole  40 mg Oral Daily   polyethylene glycol  17 g Oral Daily   senna-docusate  1 tablet Oral BID   sodium hypochlorite   Topical BID   vitamin B-12  1,000 mcg Oral Daily   Continuous Infusions:   LOS: 32 days    Time spent: 89minutes with more than 50% on [place], MD [facility] P11/07/2021, 8:25 AM

## 2021-06-24 DIAGNOSIS — G9340 Encephalopathy, unspecified: Secondary | ICD-10-CM | POA: Diagnosis not present

## 2021-06-24 DIAGNOSIS — J9601 Acute respiratory failure with hypoxia: Secondary | ICD-10-CM | POA: Diagnosis not present

## 2021-06-24 DIAGNOSIS — A419 Sepsis, unspecified organism: Secondary | ICD-10-CM | POA: Diagnosis not present

## 2021-06-24 DIAGNOSIS — N179 Acute kidney failure, unspecified: Secondary | ICD-10-CM | POA: Diagnosis not present

## 2021-06-24 NOTE — Progress Notes (Signed)
PROGRESS NOTE    Desiree Mitchell  XQJ:[id number] DOB: 01-Apr-1956 DOA: 05/21/2021 PCP: Pcp, No   Chief Complain: Found unresponsive  Brief Narrative: Patient is a 65 year old female with history of breast cancer status post bilateral mastectomy, history of VA shunt  and prior subarachnoid hemorrhage/hydrocephalus, CVA with residual right-sided deficit, asthma, hypertension who presented at emergency department after she was found unresponsive by family.  On presentation, she was hypoxic, hypotensive.  She was also found to be infested with bedbugs and cockroaches.  CT head without contrast showed development of mild hydrocephalus, periventricular edema associated with malpositioned left VA shunt.  She was seen by neurosurgery and was urgently  taken to the OR for removal of her VA shunt and placement of external ventricular drain.  Hospital course also remarkable for bacteremia for which ID was following.  Patient was transferred to [facility] on 05/30/2021.  Several discussions were held with family about goals of care.  Initially she was on comfort care but since her mental status slowly improved, family wanted to discontinue comfort care,then family decided on placing her in a skilled nursing facility.   Hospice agency was also following and recommending hospice support on long-term care.  Since last several days, she has remained hemodynamically stable, overall comfortable.  Medically stable for discharge to SNF whenever possible.TOC following  11/13 no overnight issues   Assessment & Plan:   Principal Problem:   Sepsis ([place]) Active Problems:   Endotracheally intubated   On mechanically assisted ventilation (HCC)   Shunt malfunction   Rhabdomyolysis   AKI (acute kidney injury) ([place])   Increased anion gap metabolic acidosis   Acute encephalopathy   Transaminitis   Pressure injury of skin   Acute embolic stroke ([place])   Palliative care by specialist   DNR (do not resuscitate)   Severe  sepsis/Proteus/staph epidermidis septicemia: ID were following.  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  No evidence of recent infection with negative CSF cultures.  TTE unrevealing.  Unable to perform TEE due to hemodynamic abnormalities of her epiglottis.  Completed  course of ceftriaxone and linezolid.  ID signed off.   11/13 currently on no antibiotics  VP shunt malfunction/history of aneurysm/stroke with right-sided deficits: Presented with confusion.  CT head showed development of periventricular edema associated with malpositioned left VA shunt.  Emergently taken to the OR on 10/11 with complete removal of ventriculoperitoneal shunt and placement of left parietal approach ventriculostomy.  External ventriculostomy drain removed by neurosurgery on 05/30/21.  CSF cultures negative.  11/13 no further intervention planned by neurosurgery team.        Acute hypoxic respiratory failure: Resolved.  Patient was on mechanical ventilation before.   11/13 currently on room air   Multifocal strokes: MRI showed multifocal infarcts, concerning for embolic in nature.  Neurology was consulted.  TTE unrevealing.  Unable to undergo TEE.  She has history of right-sided residual deficits.  11/13 no further work-up recommended.  Continue aspirin        Hypertension: Continue current medications.  Continue current medications, monitor blood pressure.  On amlodipine, metoprolol  Low normal vitamin B12: Received 3 days of IM vitamin B12 supplementation.  Continue oral supplementation.  Dysphagia: Has poor oral intake.  She had declined PEG tube placement.  NG tube has been removed, tube feeding discontinued.  Speech therapy following.  On dysphagia 2 diet.  Constipation: Start bowel regimen  GERD: Continue PPI  Normocytic anemia: Continue to monitor H&H.  She was transfused with a unit of PRBC on 11/2.  Hemoglobin stable in the range of 10-11  Goals of care: Palliative care  were following.  Initially she was on comfort care which has been revoked by family because her overall status improved. ToC is following for placing her on skilled nursing facility.  Palliative care recommending hospice at the skilled nursing facility.  Prognosis is poor.  Pressure Injury 05/22/21 Buttocks Right Deep Tissue Pressure Injury - Purple or maroon localized area of discolored intact skin or blood-filled blister due to damage of underlying soft tissue from pressure and/or shear. (Active)  05/22/21 0300  Location: Buttocks  Location Orientation: Right  Staging: Deep Tissue Pressure Injury - Purple or maroon localized area of discolored intact skin or blood-filled blister due to damage of underlying soft tissue from pressure and/or shear.  Wound Description (Comments):   Present on Admission: Yes     Pressure Injury 05/22/21 Coccyx Stage 2 -  Partial thickness loss of dermis presenting as a shallow open injury with a red, pink wound bed without slough. (Active)  05/22/21 0300  Location: Coccyx  Location Orientation:   Staging: Stage 2 -  Partial thickness loss of dermis presenting as a shallow open injury with a red, pink wound bed without slough.  Wound Description (Comments):   Present on Admission: Yes     Pressure Injury 05/22/21 Cervical Deep Tissue Pressure Injury - Purple or maroon localized area of discolored intact skin or blood-filled blister due to damage of underlying soft tissue from pressure and/or shear. (Active)  05/22/21 0300  Location: Cervical  Location Orientation:   Staging: Deep Tissue Pressure Injury - Purple or maroon localized area of discolored intact skin or blood-filled blister due to damage of underlying soft tissue from pressure and/or shear.  Wound Description (Comments):   Present on Admission: Yes     Pressure Injury 06/05/21 Buttocks Left Unstageable - Full thickness tissue loss in which the base of the injury is covered by slough (yellow, tan,  gray, green or brown) and/or eschar (tan, brown or black) in the wound bed. Long curved wound on whole left (Active)  06/05/21 0622  Location: Buttocks  Location Orientation: Left  Staging: Unstageable - Full thickness tissue loss in which the base of the injury is covered by slough (yellow, tan, gray, green or brown) and/or eschar (tan, brown or black) in the wound bed.  Wound Description (Comments): Long curved wound on whole left buttocks. black yellow red pink and bloody  Present on Admission:    Patient also has a Foley catheter placed for urinary retention.  She also has several pressure ulcers on the sacrum.  She will be continued on Foley catheter on discharge         Nutrition Problem: Inadequate oral intake Etiology: acute illness      DVT prophylaxis:SCD Code Status: DNR Family Communication: Status is: Inpatient.  Unsafe discharge      Consultants: Neurosurgery, PCCM, palliative care  Procedures: As above  Antimicrobials:  Anti-infectives (From admission, onward)    Start     Dose/Rate Route Frequency Ordered Stop   05/30/21 2200  linezolid (ZYVOX) tablet 600 mg        600 mg Per Tube Every 12 hours 05/30/21 1512 06/06/21 2105   05/24/21 1800  linezolid (ZYVOX) IVPB 600 mg  Status:  Discontinued        600 mg 300 mL/hr over 60 Minutes Intravenous Every 12 hours 05/24/21 1542 05/30/21 1512  05/24/21 1000  cefTRIAXone (ROCEPHIN) 2 g in sodium chloride 0.9 % 100 mL IVPB        2 g 200 mL/hr over 30 Minutes Intravenous Every 24 hours 05/23/21 1510 05/31/21 0926   05/24/21 0000  vancomycin (VANCOCIN) IVPB 1000 mg/200 mL premix  Status:  Discontinued        1,000 mg 200 mL/hr over 60 Minutes Intravenous Every 48 hours 05/22/21 0303 05/23/21 1510   05/23/21 1000  ceFEPIme (MAXIPIME) 2 g in sodium chloride 0.9 % 100 mL IVPB        2 g 200 mL/hr over 30 Minutes Intravenous Every 12 hours 05/23/21 0937 05/23/21 2135   05/22/21 2100  ceFEPIme (MAXIPIME) 2 g in  sodium chloride 0.9 % 100 mL IVPB  Status:  Discontinued        2 g 200 mL/hr over 30 Minutes Intravenous Every 24 hours 05/22/21 0304 05/23/21 0937   05/21/21 2100  vancomycin (VANCOREADY) IVPB 2000 mg/400 mL        2,000 mg 200 mL/hr over 120 Minutes Intravenous  Once 05/21/21 2029 05/22/21 0027   05/21/21 2030  ceFEPIme (MAXIPIME) 2 g in sodium chloride 0.9 % 100 mL IVPB        2 g 200 mL/hr over 30 Minutes Intravenous  Once 05/21/21 2023 05/21/21 2210   05/21/21 2030  metroNIDAZOLE (FLAGYL) IVPB 500 mg        500 mg 100 mL/hr over 60 Minutes Intravenous  Once 05/21/21 2023 05/21/21 2315   05/21/21 2030  vancomycin (VANCOCIN) IVPB 1000 mg/200 mL premix  Status:  Discontinued        1,000 mg 200 mL/hr over 60 Minutes Intravenous  Once 05/21/21 2023 05/21/21 2029       Subjective: Patient feels full as she ate breakfast today.  No other complaint Objective: Vitals:   06/23/21 2025 06/24/21 0034 06/24/21 0500 06/24/21 0534  BP: (!) 159/77 (!) 144/66  (!) 159/91  Pulse: 99 83  94  Resp: 18 16  18   Temp: 98.5 F (36.9 C) 98.8 F (37.1 C)  98.7 F (37.1 C)  TempSrc: Oral Oral  Oral  SpO2: 95% 93%  94%  Weight:   102 kg   Height:        Intake/Output Summary (Last 24 hours) at 06/24/2021 0819 Last data filed at 06/24/2021 0650 Gross per 24 hour  Intake 0 ml  Output 850 ml  Net -850 ml   Filed Weights   06/22/21 0500 06/23/21 0500 06/24/21 0500  Weight: 101.5 kg 103.5 kg 102 kg    Examination: NAD, calm CTA anteriorly no wheezing Regular S1-S2 no gallops Soft benign positive bowel sounds No edema Awake and alert Mood and affect appropriate in current setting   Data Reviewed: I have personally reviewed following labs and imaging studies  CBC: Recent Labs  Lab 06/19/21 0556  WBC 10.7*  NEUTROABS 7.1  HGB 11.0*  HCT 34.6*  MCV 98.3  PLT 937*   Basic Metabolic Panel: No results for input(s): NA, K, CL, CO2, GLUCOSE, BUN, CREATININE, CALCIUM, MG, PHOS in  the last 168 hours.  GFR: Estimated Creatinine Clearance: 76.7 mL/min (by C-G formula based on SCr of 0.84 mg/dL). Liver Function Tests: No results for input(s): AST, ALT, ALKPHOS, BILITOT, PROT, ALBUMIN in the last 168 hours.  No results for input(s): LIPASE, AMYLASE in the last 168 hours. No results for input(s): AMMONIA in the last 168 hours. Coagulation Profile: No results for input(s): INR, PROTIME  in the last 168 hours. Cardiac Enzymes: No results for input(s): CKTOTAL, CKMB, CKMBINDEX, TROPONINI in the last 168 hours. BNP (last 3 results) No results for input(s): PROBNP in the last 8760 hours. HbA1C: No results for input(s): HGBA1C in the last 72 hours. CBG: No results for input(s): GLUCAP in the last 168 hours.  Lipid Profile: No results for input(s): CHOL, HDL, LDLCALC, TRIG, CHOLHDL, LDLDIRECT in the last 72 hours. Thyroid Function Tests: No results for input(s): TSH, T4TOTAL, FREET4, T3FREE, THYROIDAB in the last 72 hours. Anemia Panel: No results for input(s): VITAMINB12, FOLATE, FERRITIN, TIBC, IRON, RETICCTPCT in the last 72 hours. Sepsis Labs: No results for input(s): PROCALCITON, LATICACIDVEN in the last 168 hours.  Recent Results (from the past 240 hour(s))  Aerobic/Anaerobic Culture w Gram Stain (surgical/deep wound)     Status: Abnormal (Preliminary result)   Collection Time: 06/20/21  3:58 PM   Specimen: Back; Wound  Result Value Ref Range Status   Specimen Description   Final    BACK Performed at [facility], [address], [place], [place] [address]    Special Requests   Final    NONE Performed at [facility], [address] [place], [place] [address]    Gram Stain   Final    FEW WBC PRESENT,BOTH PMN AND MONONUCLEAR RARE GRAM NEGATIVE RODS RARE GRAM VARIABLE COCCI Performed at [facility], [place] [address], [place], [place] [address]    Culture (A)  Final    MULTIPLE ORGANISMS PRESENT, NONE PREDOMINANT NO  STAPHYLOCOCCUS AUREUS ISOLATED NO GROUP A STREP (S.PYOGENES) ISOLATED NO ANAEROBES ISOLATED; CULTURE IN PROGRESS FOR 5 DAYS    Report Status PENDING  Incomplete         Radiology Studies: No results found.      Scheduled Meds:  amLODipine  5 mg Oral Daily   aspirin EC  81 mg Oral Daily   Chlorhexidine Gluconate Cloth  6 each Topical Daily   feeding supplement (NEPRO CARB STEADY)  237 mL Oral TID BM   metoprolol tartrate  25 mg Oral BID   multivitamin with minerals  1 tablet Oral Daily   pantoprazole  40 mg Oral Daily   polyethylene glycol  17 g Oral Daily   senna-docusate  1 tablet Oral BID   sodium hypochlorite   Topical BID   vitamin B-12  1,000 mcg Oral Daily   Continuous Infusions:   LOS: 33 days    Time spent: 49minutes with more than 50% on [place], MD [facility] P11/13/2022, 8:19 AM

## 2021-06-25 DIAGNOSIS — I639 Cerebral infarction, unspecified: Secondary | ICD-10-CM | POA: Diagnosis not present

## 2021-06-25 DIAGNOSIS — I10 Essential (primary) hypertension: Secondary | ICD-10-CM

## 2021-06-25 DIAGNOSIS — A419 Sepsis, unspecified organism: Secondary | ICD-10-CM | POA: Diagnosis not present

## 2021-06-25 DIAGNOSIS — N179 Acute kidney failure, unspecified: Secondary | ICD-10-CM | POA: Diagnosis not present

## 2021-06-25 DIAGNOSIS — G9340 Encephalopathy, unspecified: Secondary | ICD-10-CM | POA: Diagnosis not present

## 2021-06-25 LAB — URINALYSIS, COMPLETE (UACMP) WITH MICROSCOPIC
Bilirubin Urine: NEGATIVE
Glucose, UA: NEGATIVE mg/dL
Hgb urine dipstick: NEGATIVE
Ketones, ur: NEGATIVE mg/dL
Nitrite: NEGATIVE
Protein, ur: 30 mg/dL — AB
Specific Gravity, Urine: 1.028 (ref 1.005–1.030)
WBC, UA: >50 WBC/hpf — ABNORMAL HIGH (ref 0–5)
pH: 5 (ref 5.0–8.0)

## 2021-06-25 LAB — AEROBIC/ANAEROBIC CULTURE W GRAM STAIN (SURGICAL/DEEP WOUND)

## 2021-06-25 MED ORDER — AMLODIPINE BESYLATE 5 MG PO TABS
5.0000 mg | ORAL_TABLET | Freq: Once | ORAL | Status: AC
  Administered 2021-06-25: 5 mg via ORAL
  Filled 2021-06-25: qty 1

## 2021-06-25 MED ORDER — AMLODIPINE BESYLATE 10 MG PO TABS
10.0000 mg | ORAL_TABLET | Freq: Every day | ORAL | Status: DC
  Administered 2021-06-26 – 2021-07-11 (×16): 10 mg via ORAL
  Filled 2021-06-25 (×16): qty 1

## 2021-06-25 NOTE — Progress Notes (Signed)
PROGRESS NOTE    Desiree Mitchell  UXL:[id number] DOB: 02-10-1956 DOA: 05/21/2021 PCP: Pcp, No   Chief Complain: Found unresponsive  Brief Narrative: Patient is a 65 year old female with history of breast cancer status post bilateral mastectomy, history of VA shunt  and prior subarachnoid hemorrhage/hydrocephalus, CVA with residual right-sided deficit, asthma, hypertension who presented at emergency department after she was found unresponsive by family.  On presentation, she was hypoxic, hypotensive.  She was also found to be infested with bedbugs and cockroaches.  CT head without contrast showed development of mild hydrocephalus, periventricular edema associated with malpositioned left VA shunt.  She was seen by neurosurgery and was urgently  taken to the OR for removal of her VA shunt and placement of external ventricular drain.  Hospital course also remarkable for bacteremia for which ID was following.  Patient was transferred to [facility] on 05/30/2021.  Several discussions were held with family about goals of care.  Initially she was on comfort care but since her mental status slowly improved, family wanted to discontinue comfort care,then family decided on placing her in a skilled nursing facility.   Hospice agency was also following and recommending hospice support on long-term care.  Since last several days, she has remained hemodynamically stable, overall comfortable.  Medically stable for discharge to SNF whenever possible.TOC following  11/14 Foley appears cloudy with sediments.  Patient however has no complaints.   Assessment & Plan:   Principal Problem:   Sepsis ([place]) Active Problems:   Endotracheally intubated   On mechanically assisted ventilation (HCC)   Shunt malfunction   Rhabdomyolysis   AKI (acute kidney injury) ([place])   Increased anion gap metabolic acidosis   Acute encephalopathy   Transaminitis   Pressure injury of skin   Acute embolic stroke ([place])   Palliative care by  specialist   DNR (do not resuscitate)   Severe sepsis/Proteus/staph epidermidis septicemia: ID were following.  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  No evidence of recent infection with negative CSF cultures.  TTE unrevealing.  Unable to perform TEE due to hemodynamic abnormalities of her epiglottis.  Completed  course of ceftriaxone and linezolid.  ID signed off.   11/14 currently on no abx Urine appears cloudy in Foley.  Will check UA and urine culture    VP shunt malfunction/history of aneurysm/stroke with right-sided deficits: Presented with confusion.  CT head showed development of periventricular edema associated with malpositioned left VA shunt.  Emergently taken to the OR on 10/11 with complete removal of ventriculoperitoneal shunt and placement of left parietal approach ventriculostomy.  External ventriculostomy drain removed by neurosurgery on 05/30/21.  CSF cultures negative.  11/14 no further intervention planned by neurosurgery team         Acute hypoxic respiratory failure: Resolved.  Patient was on mechanical ventilation before.   11/14 currently on room air     Multifocal strokes: MRI showed multifocal infarcts, concerning for embolic in nature.  Neurology was consulted.  TTE unrevealing.  Unable to undergo TEE.  She has history of right-sided residual deficits.  11/14 no further work-up recommended.  Continue aspirin          Hypertension:  Bp has room for improvement, will increase amlodipine to 10 mg daily  Continue metoprolol     Low normal vitamin B12: Received 3 days of IM vitamin B12 supplementation.  Continue oral supplementation.  Dysphagia: Has poor oral intake.  She had declined PEG tube placement.  NG tube  has been removed, tube feeding discontinued.  Speech therapy following.  On dysphagia 2 diet.  Constipation: Start bowel regimen  GERD: Continue PPI  Normocytic anemia: Continue to monitor H&H.  She was  transfused with a unit of PRBC on 11/2.  Hemoglobin stable in the range of 10-11  Goals of care: Palliative care were following.  Initially she was on comfort care which has been revoked by family because her overall status improved. ToC is following for placing her on skilled nursing facility.  Palliative care recommending hospice at the skilled nursing facility.  Prognosis is poor.  Pressure Injury 05/22/21 Buttocks Right Deep Tissue Pressure Injury - Purple or maroon localized area of discolored intact skin or blood-filled blister due to damage of underlying soft tissue from pressure and/or shear. (Active)  05/22/21 0300  Location: Buttocks  Location Orientation: Right  Staging: Deep Tissue Pressure Injury - Purple or maroon localized area of discolored intact skin or blood-filled blister due to damage of underlying soft tissue from pressure and/or shear.  Wound Description (Comments):   Present on Admission: Yes     Pressure Injury 05/22/21 Coccyx Stage 2 -  Partial thickness loss of dermis presenting as a shallow open injury with a red, pink wound bed without slough. (Active)  05/22/21 0300  Location: Coccyx  Location Orientation:   Staging: Stage 2 -  Partial thickness loss of dermis presenting as a shallow open injury with a red, pink wound bed without slough.  Wound Description (Comments):   Present on Admission: Yes     Pressure Injury 05/22/21 Cervical Deep Tissue Pressure Injury - Purple or maroon localized area of discolored intact skin or blood-filled blister due to damage of underlying soft tissue from pressure and/or shear. (Active)  05/22/21 0300  Location: Cervical  Location Orientation:   Staging: Deep Tissue Pressure Injury - Purple or maroon localized area of discolored intact skin or blood-filled blister due to damage of underlying soft tissue from pressure and/or shear.  Wound Description (Comments):   Present on Admission: Yes     Pressure Injury 06/05/21 Buttocks  Left Unstageable - Full thickness tissue loss in which the base of the injury is covered by slough (yellow, tan, gray, green or brown) and/or eschar (tan, brown or black) in the wound bed. Long curved wound on whole left (Active)  06/05/21 0622  Location: Buttocks  Location Orientation: Left  Staging: Unstageable - Full thickness tissue loss in which the base of the injury is covered by slough (yellow, tan, gray, green or brown) and/or eschar (tan, brown or black) in the wound bed.  Wound Description (Comments): Long curved wound on whole left buttocks. black yellow red pink and bloody  Present on Admission:    Patient also has a Foley catheter placed for urinary retention.  She also has several pressure ulcers on the sacrum.  She will be continued on Foley catheter on discharge         Nutrition Problem: Inadequate oral intake Etiology: acute illness      DVT prophylaxis:SCD Code Status: DNR Family Communication: Status is: Inpatient.  Unsafe discharge      Consultants: Neurosurgery, PCCM, palliative care  Procedures: As above  Antimicrobials:  Anti-infectives (From admission, onward)    Start     Dose/Rate Route Frequency Ordered Stop   05/30/21 2200  linezolid (ZYVOX) tablet 600 mg        600 mg Per Tube Every 12 hours 05/30/21 1512 06/06/21 2105   05/24/21 1800  linezolid (ZYVOX) IVPB 600 mg  Status:  Discontinued        600 mg 300 mL/hr over 60 Minutes Intravenous Every 12 hours 05/24/21 1542 05/30/21 1512   05/24/21 1000  cefTRIAXone (ROCEPHIN) 2 g in sodium chloride 0.9 % 100 mL IVPB        2 g 200 mL/hr over 30 Minutes Intravenous Every 24 hours 05/23/21 1510 05/31/21 0926   05/24/21 0000  vancomycin (VANCOCIN) IVPB 1000 mg/200 mL premix  Status:  Discontinued        1,000 mg 200 mL/hr over 60 Minutes Intravenous Every 48 hours 05/22/21 0303 05/23/21 1510   05/23/21 1000  ceFEPIme (MAXIPIME) 2 g in sodium chloride 0.9 % 100 mL IVPB        2 g 200 mL/hr over  30 Minutes Intravenous Every 12 hours 05/23/21 0937 05/23/21 2135   05/22/21 2100  ceFEPIme (MAXIPIME) 2 g in sodium chloride 0.9 % 100 mL IVPB  Status:  Discontinued        2 g 200 mL/hr over 30 Minutes Intravenous Every 24 hours 05/22/21 0304 05/23/21 0937   05/21/21 2100  vancomycin (VANCOREADY) IVPB 2000 mg/400 mL        2,000 mg 200 mL/hr over 120 Minutes Intravenous  Once 05/21/21 2029 05/22/21 0027   05/21/21 2030  ceFEPIme (MAXIPIME) 2 g in sodium chloride 0.9 % 100 mL IVPB        2 g 200 mL/hr over 30 Minutes Intravenous  Once 05/21/21 2023 05/21/21 2210   05/21/21 2030  metroNIDAZOLE (FLAGYL) IVPB 500 mg        500 mg 100 mL/hr over 60 Minutes Intravenous  Once 05/21/21 2023 05/21/21 2315   05/21/21 2030  vancomycin (VANCOCIN) IVPB 1000 mg/200 mL premix  Status:  Discontinued        1,000 mg 200 mL/hr over 60 Minutes Intravenous  Once 05/21/21 2023 05/21/21 2029       Subjective: No sob, cp, abd pain  Objective: Vitals:   06/25/21 0500 06/25/21 0531 06/25/21 0742 06/25/21 0754  BP:  (!) 158/85 (!) 150/90 (!) 175/80  Pulse:  94 93 90  Resp:  18 20   Temp:  98.5 F (36.9 C) 98.2 F (36.8 C)   TempSrc:  Oral Oral   SpO2:  95% 98%   Weight: 104.5 kg     Height:        Intake/Output Summary (Last 24 hours) at 06/25/2021 0849 Last data filed at 06/25/2021 1941 Gross per 24 hour  Intake 0 ml  Output 650 ml  Net -650 ml   Filed Weights   06/23/21 0500 06/24/21 0500 06/25/21 0500  Weight: 103.5 kg 102 kg 104.5 kg    Examination: NAD, calm CTA no wheeze rales rhonchi's Regular S1-S2 no gallops Soft benign positive bowel sounds No edema Foley with cloudy urine and some sediments Awake alert Mood and affect appropriate in current setting  Data Reviewed: I have personally reviewed following labs and imaging studies  CBC: Recent Labs  Lab 06/19/21 0556  WBC 10.7*  NEUTROABS 7.1  HGB 11.0*  HCT 34.6*  MCV 98.3  PLT 740*   Basic Metabolic Panel: No  results for input(s): NA, K, CL, CO2, GLUCOSE, BUN, CREATININE, CALCIUM, MG, PHOS in the last 168 hours.  GFR: Estimated Creatinine Clearance: 77.8 mL/min (by C-G formula based on SCr of 0.84 mg/dL). Liver Function Tests: No results for input(s): AST, ALT, ALKPHOS, BILITOT, PROT, ALBUMIN in the last 168 hours.  No results for input(s): LIPASE, AMYLASE in the last 168 hours. No results for input(s): AMMONIA in the last 168 hours. Coagulation Profile: No results for input(s): INR, PROTIME in the last 168 hours. Cardiac Enzymes: No results for input(s): CKTOTAL, CKMB, CKMBINDEX, TROPONINI in the last 168 hours. BNP (last 3 results) No results for input(s): PROBNP in the last 8760 hours. HbA1C: No results for input(s): HGBA1C in the last 72 hours. CBG: No results for input(s): GLUCAP in the last 168 hours.  Lipid Profile: No results for input(s): CHOL, HDL, LDLCALC, TRIG, CHOLHDL, LDLDIRECT in the last 72 hours. Thyroid Function Tests: No results for input(s): TSH, T4TOTAL, FREET4, T3FREE, THYROIDAB in the last 72 hours. Anemia Panel: No results for input(s): VITAMINB12, FOLATE, FERRITIN, TIBC, IRON, RETICCTPCT in the last 72 hours. Sepsis Labs: No results for input(s): PROCALCITON, LATICACIDVEN in the last 168 hours.  Recent Results (from the past 240 hour(s))  Aerobic/Anaerobic Culture w Gram Stain (surgical/deep wound)     Status: Abnormal (Preliminary result)   Collection Time: 06/20/21  3:58 PM   Specimen: Back; Wound  Result Value Ref Range Status   Specimen Description   Final    BACK Performed at [facility], [address] [place], [place] [address]    Special Requests   Final    NONE Performed at [facility], [address] [place], [place] [address]    Gram Stain   Final    FEW WBC PRESENT,BOTH PMN AND MONONUCLEAR RARE GRAM NEGATIVE RODS RARE GRAM VARIABLE COCCI Performed at [facility], [place] [address], [place],  [address]     Culture (A)  Final    MULTIPLE ORGANISMS PRESENT, NONE PREDOMINANT NO STAPHYLOCOCCUS AUREUS ISOLATED NO GROUP A STREP (S.PYOGENES) ISOLATED NO ANAEROBES ISOLATED; CULTURE IN PROGRESS FOR 5 DAYS    Report Status PENDING  Incomplete         Radiology Studies: No results found.      Scheduled Meds:  amLODipine  5 mg Oral Daily   aspirin EC  81 mg Oral Daily   Chlorhexidine Gluconate Cloth  6 each Topical Daily   feeding supplement (NEPRO CARB STEADY)  237 mL Oral TID BM   metoprolol tartrate  25 mg Oral BID   multivitamin with minerals  1 tablet Oral Daily   pantoprazole  40 mg Oral Daily   polyethylene glycol  17 g Oral Daily   senna-docusate  1 tablet Oral BID   vitamin B-12  1,000 mcg Oral Daily   Continuous Infusions:   LOS: 34 days    Time spent: 8minutes with more than 50% on [place], MD [facility] P11/14/2022, 8:49 AM

## 2021-06-25 NOTE — TOC Progression Note (Signed)
Transition of Care [facility]) - Progression Note    Patient Details  Name: Desiree Mitchell MRN: [id number] Date of Birth: 04-17-1956  Transition of Care [facility]) CM/SW Contact  Shelbie Hutching, RN Phone Number: 06/25/2021, 12:25 PM  Clinical Narrative:    Message left x 2 for Debbie over at [place] to check on insurance authorization status.   Expected Discharge Plan: Skilled Nursing Facility Barriers to Discharge: Insurance Authorization  Expected Discharge Plan and Services Expected Discharge Plan: [place] In-house Referral: Clinical Social Work Discharge Planning Services: CM Consult Post Acute Care Choice: [place] Living arrangements for the past 2 months: Single Family Home                 DME Arranged: N/A DME Agency: NA       HH Arranged: NA HH Agency: NA         Social Determinants of Health (SDOH) Interventions    Readmission Risk Interventions No flowsheet data found.

## 2021-06-26 DIAGNOSIS — A419 Sepsis, unspecified organism: Secondary | ICD-10-CM | POA: Diagnosis not present

## 2021-06-26 DIAGNOSIS — I639 Cerebral infarction, unspecified: Secondary | ICD-10-CM | POA: Diagnosis not present

## 2021-06-26 DIAGNOSIS — J9601 Acute respiratory failure with hypoxia: Secondary | ICD-10-CM | POA: Diagnosis not present

## 2021-06-26 DIAGNOSIS — G9340 Encephalopathy, unspecified: Secondary | ICD-10-CM | POA: Diagnosis not present

## 2021-06-26 MED ORDER — SODIUM CHLORIDE 0.9 % IV SOLN
1.0000 g | INTRAVENOUS | Status: DC
  Administered 2021-06-26 – 2021-06-28 (×3): 1 g via INTRAVENOUS
  Filled 2021-06-26: qty 1
  Filled 2021-06-26: qty 10
  Filled 2021-06-26: qty 1

## 2021-06-26 NOTE — Progress Notes (Signed)
Physical Therapy Treatment Patient Details Name: Desiree Mitchell MRN: [id number] DOB: 1955/10/08 Today's Date: 06/26/2021   History of Present Illness Pt is a 65 y.o. female presenting to hospital 05/21/21 with AMS (found unresponsive by family).  Significant pressure sore present back of neck, buttocks, and coccyx.  Pt noted to be infested with bed bugs and roaches.  Pt admitted with acute respiratory failure, rhabdomyolysis, metabolic acidosis, acute kidney injury, sepsis, VA shunt malfunction, transaminitis, and posterior neck/buttocks/coccyx pressure injuries.  S/p removal ventriculoatrial shunt and placement of L parietal approach ventriculostomy 05/22/21; extubated 10/11; MRI brain 10/14 showing few scattered small acute infarcts involving different vascular territories (watershed type infarcts); TEE 10/18; and EVD removed 10/19.    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    PT Comments    Pt in bed, states feeling poorly both emotionally and physically. Pt does not respond to questions about pain but grimaces during mobility and bending R knee. Pt requested several times to return to bed and did not want to participate in out of bed mobility. Encouragement necessary for willingness to progress session. Pt continues to require MAX A for all activities with limited patient engagement. PT to trial one further treatment session and will be discharged from PT if limited improvement remains. SNF remains current discharge recommendation for now. PT to reassess as able. Skilled PT intervention is indicated to address deficits in function, mobility, and to return to PLOF as able.    Recommendations for follow up therapy are one component of a multi-disciplinary discharge planning process, led by the attending physician.  Recommendations may be updated based on patient status, additional functional criteria and insurance  authorization.  Follow Up Recommendations  Skilled nursing-short term rehab (<3 hours/day)     Assistance Recommended at Discharge Frequent or constant Supervision/Assistance  Equipment Recommendations  None recommended by PT    Recommendations for Other Services       Precautions / Restrictions Precautions Precautions: Fall Precaution Comments: Aspiration; gastric tube; R chest port;  buttocks pressure injuries Restrictions Weight Bearing Restrictions: No     Mobility  Bed Mobility Overal bed mobility: Needs Assistance Bed Mobility: Supine to Sit;Sit to Supine;Rolling Rolling: Max assist   Supine to sit: +2 for physical assistance;Mod assist Sit to supine: Max assist;+2 for physical assistance   General bed mobility comments: pt rolled to bed pan    Transfers Overall transfer level: Needs assistance Equipment used: 2 person hand held assist Transfers: Sit to/from Stand Sit to Stand: Max assist;+2 physical assistance          Lateral/Scoot Transfers: Max assist;From elevated surface General transfer comment: requests to return to bed following x 2 attempts    Ambulation/Gait               General Gait Details: deferred   Stairs             Wheelchair Mobility    Modified Rankin (Stroke Patients Only)       Balance Overall balance assessment: Needs assistance Sitting-balance support: Bilateral upper extremity supported;Feet supported Sitting balance-Leahy Scale: Fair Sitting balance - Comments: able to maintain balance without physical assist throughout tx session, cues when fatigued to prevent posterior lean Postural control: Posterior lean Standing balance support: Bilateral upper extremity supported Standing balance-Leahy Scale: Zero  Cognition Arousal/Alertness: Awake/alert Behavior During Therapy: WFL for tasks assessed/performed Overall Cognitive Status: No family/caregiver present to  determine baseline cognitive functioning                                 General Comments: Increased time for task processing. Continues to have sullen mood but participates w/ extensive encouragement        Exercises Other Exercises Other Exercises: seated activites with forward reaching and scooting to end of bed w/ MOD A for scoot    General Comments        Pertinent Vitals/Pain Pain Assessment: Faces Faces Pain Scale: Hurts little more Pain Location: buttocks and R knee Pain Descriptors / Indicators: Discomfort;Grimacing Pain Intervention(s): Monitored during session;Limited activity within patient's tolerance    Home Living                          Prior Function            PT Goals (current goals can now be found in the care plan section) Progress towards PT goals: Progressing toward goals    Frequency    Min 2X/week      PT Plan Current plan remains appropriate    Co-evaluation PT/OT/SLP Co-Evaluation/Treatment: Yes Reason for Co-Treatment: Complexity of the patient's impairments (multi-system involvement);Necessary to address cognition/behavior during functional activity;For patient/therapist safety;To address functional/ADL transfers PT goals addressed during session: Mobility/safety with mobility;Balance OT goals addressed during session: ADL's and self-care      AM-PAC PT "6 Clicks" Mobility   Outcome Measure  Help needed turning from your back to your side while in a flat bed without using bedrails?: A Lot Help needed moving from lying on your back to sitting on the side of a flat bed without using bedrails?: A Lot Help needed moving to and from a bed to a chair (including a wheelchair)?: Total Help needed standing up from a chair using your arms (e.g., wheelchair or bedside chair)?: A Lot Help needed to walk in hospital room?: Total Help needed climbing 3-5 steps with a railing? : Total 6 Click Score: 9    End of  Session Equipment Utilized During Treatment: Gait belt Activity Tolerance: Patient limited by fatigue Patient left: in bed;with call bell/phone within reach Nurse Communication: Mobility status PT Visit Diagnosis: Other abnormalities of gait and mobility (R26.89);Muscle weakness (generalized) (M62.81);Difficulty in walking, not elsewhere classified (R26.2)     Time: 1346-1410 PT Time Calculation (min) (ACUTE ONLY): 24 min  Charges:             [employer], SPT

## 2021-06-26 NOTE — Progress Notes (Signed)
PROGRESS NOTE    Desiree Mitchell  JTT:[id number] DOB: [birth year] DOA: 05/21/2021 PCP: Pcp, No   Chief Complain: Found unresponsive  Brief Narrative: Patient is a 65 year old female with history of breast cancer status post bilateral mastectomy, history of VA shunt  and prior subarachnoid hemorrhage/hydrocephalus, CVA with residual right-sided deficit, asthma, hypertension who presented at emergency department after she was found unresponsive by family.  On presentation, she was hypoxic, hypotensive.  She was also found to be infested with bedbugs and cockroaches.  CT head without contrast showed development of mild hydrocephalus, periventricular edema associated with malpositioned left VA shunt.  She was seen by neurosurgery and was urgently  taken to the OR for removal of her VA shunt and placement of external ventricular drain.  Hospital course also remarkable for bacteremia for which ID was following.  Patient was transferred to [facility] on 05/30/2021.  Several discussions were held with family about goals of care.  Initially she was on comfort care but since her mental status slowly improved, family wanted to discontinue comfort care,then family decided on placing her in a skilled nursing facility.   Hospice agency was also following and recommending hospice support on long-term care.  Since last several days, she has remained hemodynamically stable, overall comfortable.  Medically stable for discharge to SNF whenever possible.TOC following  11/15[place]mer[place] week: Waiting for SNF. Foley was cloudy with sediments. Sent off for UA and Ucx. UA +, started on iv abx. F/u ucx.  Discontinuing foley, do bladder scan 24 hrs, I/0 .     Assessment & Plan:   Principal Problem:   Sepsis ([place]) Active Problems:   Endotracheally intubated   On mechanically assisted ventilation (HCC)   Shunt malfunction   Rhabdomyolysis   AKI (acute kidney injury) ([place])   Increased anion gap metabolic acidosis   Acute  encephalopathy   Transaminitis   Pressure injury of skin   Acute embolic stroke [facility])   Palliative care by specialist   DNR (do not resuscitate)   UTI New  (11/15) . Foley with cloudy urine and sediments. UA +. Ucx pending Dc foley today. Ck bladder scan x24 hr to make sure no urinary retention.  Start ceftriaxone IV    Severe sepsis/Proteus/staph epidermidis septicemia: ID were following.  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  No evidence of recent infection with negative CSF cultures.  TTE unrevealing.  Unable to perform TEE due to hemodynamic abnormalities of her epiglottis.  11/15 completed the course of his treatment with ceftriaxone and linezolid.  ID signed off         VP shunt malfunction/history of aneurysm/stroke with right-sided deficits: Presented with confusion.  CT head showed development of periventricular edema associated with malpositioned left VA shunt.  Emergently taken to the OR on 10/11 with complete removal of ventriculoperitoneal shunt and placement of left parietal approach ventriculostomy.  External ventriculostomy drain removed by neurosurgery on 05/30/21.  CSF cultures negative.  No further intervention planned by neurosurgery team           Acute hypoxic respiratory failure: Resolved.  Patient was on mechanical ventilation before.   Currently on room air       Multifocal strokes: MRI showed multifocal infarcts, concerning for embolic in nature.  Neurology was consulted.  TTE unrevealing.  Unable to undergo TEE.  She has history of right-sided residual deficits.   no further work-up recommended.  Continue aspirin          Hypertension:  Bp has room for improvement, will increase amlodipine to 10 mg daily  Continue metoprolol     Low normal vitamin B12: Received 3 days of IM vitamin B12 supplementation.  Continue oral supplementation.  Dysphagia: Has poor oral intake.  She had declined PEG tube  placement.  NG tube has been removed, tube feeding discontinued.  Speech therapy following.  On dysphagia 2 diet.  Constipation: Start bowel regimen  GERD: Continue PPI  Normocytic anemia: Continue to monitor H&H.  She was transfused with a unit of PRBC on 11/2.  Hemoglobin stable in the range of 10-11  Goals of care: Palliative care were following.  Initially she was on comfort care which has been revoked by family because her overall status improved. ToC is following for placing her on skilled nursing facility.  Palliative care recommending hospice at the skilled nursing facility.  Prognosis is poor.  Pressure Injury 05/22/21 Buttocks Right Deep Tissue Pressure Injury - Purple or maroon localized area of discolored intact skin or blood-filled blister due to damage of underlying soft tissue from pressure and/or shear. (Active)  05/22/21 0300  Location: Buttocks  Location Orientation: Right  Staging: Deep Tissue Pressure Injury - Purple or maroon localized area of discolored intact skin or blood-filled blister due to damage of underlying soft tissue from pressure and/or shear.  Wound Description (Comments):   Present on Admission: Yes     Pressure Injury 05/22/21 Coccyx Stage 2 -  Partial thickness loss of dermis presenting as a shallow open injury with a red, pink wound bed without slough. (Active)  05/22/21 0300  Location: Coccyx  Location Orientation:   Staging: Stage 2 -  Partial thickness loss of dermis presenting as a shallow open injury with a red, pink wound bed without slough.  Wound Description (Comments):   Present on Admission: Yes     Pressure Injury 05/22/21 Cervical Deep Tissue Pressure Injury - Purple or maroon localized area of discolored intact skin or blood-filled blister due to damage of underlying soft tissue from pressure and/or shear. (Active)  05/22/21 0300  Location: Cervical  Location Orientation:   Staging: Deep Tissue Pressure Injury - Purple or maroon  localized area of discolored intact skin or blood-filled blister due to damage of underlying soft tissue from pressure and/or shear.  Wound Description (Comments):   Present on Admission: Yes     Pressure Injury 06/05/21 Buttocks Left Unstageable - Full thickness tissue loss in which the base of the injury is covered by slough (yellow, tan, gray, green or brown) and/or eschar (tan, brown or black) in the wound bed. Long curved wound on whole left (Active)  06/05/21 0622  Location: Buttocks  Location Orientation: Left  Staging: Unstageable - Full thickness tissue loss in which the base of the injury is covered by slough (yellow, tan, gray, green or brown) and/or eschar (tan, brown or black) in the wound bed.  Wound Description (Comments): Long curved wound on whole left buttocks. black yellow red pink and bloody  Present on Admission:    Patient also has a Foley catheter placed for urinary retention.  She also has several pressure ulcers on the sacrum.  She will be continued on Foley catheter on discharge         Nutrition Problem: Inadequate oral intake Etiology: acute illness      DVT prophylaxis:SCD Code Status: DNR Family Communication: Status is: Inpatient.  Unsafe discharge      Consultants: Neurosurgery, PCCM, palliative care  Procedures: As above  Antimicrobials:  Anti-infectives (From admission, onward)    Start     Dose/Rate Route Frequency Ordered Stop   06/26/21 1000  cefTRIAXone (ROCEPHIN) 1 g in sodium chloride 0.9 % 100 mL IVPB        1 g 200 mL/hr over 30 Minutes Intravenous Every 24 hours 06/26/21 0845     05/30/21 2200  linezolid (ZYVOX) tablet 600 mg        600 mg Per Tube Every 12 hours 05/30/21 1512 06/06/21 2105   05/24/21 1800  linezolid (ZYVOX) IVPB 600 mg  Status:  Discontinued        600 mg 300 mL/hr over 60 Minutes Intravenous Every 12 hours 05/24/21 1542 05/30/21 1512   05/24/21 1000  cefTRIAXone (ROCEPHIN) 2 g in sodium chloride 0.9 % 100  mL IVPB        2 g 200 mL/hr over 30 Minutes Intravenous Every 24 hours 05/23/21 1510 05/31/21 0926   05/24/21 0000  vancomycin (VANCOCIN) IVPB 1000 mg/200 mL premix  Status:  Discontinued        1,000 mg 200 mL/hr over 60 Minutes Intravenous Every 48 hours 05/22/21 0303 05/23/21 1510   05/23/21 1000  ceFEPIme (MAXIPIME) 2 g in sodium chloride 0.9 % 100 mL IVPB        2 g 200 mL/hr over 30 Minutes Intravenous Every 12 hours 05/23/21 0937 05/23/21 2135   05/22/21 2100  ceFEPIme (MAXIPIME) 2 g in sodium chloride 0.9 % 100 mL IVPB  Status:  Discontinued        2 g 200 mL/hr over 30 Minutes Intravenous Every 24 hours 05/22/21 0304 05/23/21 0937   05/21/21 2100  vancomycin (VANCOREADY) IVPB 2000 mg/400 mL        2,000 mg 200 mL/hr over 120 Minutes Intravenous  Once 05/21/21 2029 05/22/21 0027   05/21/21 2030  ceFEPIme (MAXIPIME) 2 g in sodium chloride 0.9 % 100 mL IVPB        2 g 200 mL/hr over 30 Minutes Intravenous  Once 05/21/21 2023 05/21/21 2210   05/21/21 2030  metroNIDAZOLE (FLAGYL) IVPB 500 mg        500 mg 100 mL/hr over 60 Minutes Intravenous  Once 05/21/21 2023 05/21/21 2315   05/21/21 2030  vancomycin (VANCOCIN) IVPB 1000 mg/200 mL premix  Status:  Discontinued        1,000 mg 200 mL/hr over 60 Minutes Intravenous  Once 05/21/21 2023 05/21/21 2029       Subjective: Denies shortness of breath, abdominal pain, dizziness  Objective: Vitals:   06/25/21 0938 06/25/21 1119 06/25/21 2246 06/26/21 0428  BP: (!) 173/82  133/74 140/76  Pulse: (!) 101 (!) 101 84 85  Resp:   16 16  Temp:  98.2 F (36.8 C) 98.5 F (36.9 C)   TempSrc:  Oral Oral   SpO2: 98% 98% 94% 95%  Weight:      Height:        Intake/Output Summary (Last 24 hours) at 06/26/2021 0850 Last data filed at 06/25/2021 1857 Gross per 24 hour  Intake 120 ml  Output --  Net 120 ml   Filed Weights   06/23/21 0500 06/24/21 0500 06/25/21 0500  Weight: 103.5 kg 102 kg 104.5 kg    Examination: NAD,  calm NAD, calm CTA no wheeze rales Regular S1-S2 no gallops Soft benign positive bowel sounds SCD in place Mood and affect appropriate in current setting  Data Reviewed: I have personally reviewed following labs and imaging studies  CBC:  No results for input(s): WBC, NEUTROABS, HGB, HCT, MCV, PLT in the last 168 hours.  Basic Metabolic Panel: No results for input(s): NA, K, CL, CO2, GLUCOSE, BUN, CREATININE, CALCIUM, MG, PHOS in the last 168 hours.  GFR: Estimated Creatinine Clearance: 77.8 mL/min (by C-G formula based on SCr of 0.84 mg/dL). Liver Function Tests: No results for input(s): AST, ALT, ALKPHOS, BILITOT, PROT, ALBUMIN in the last 168 hours.  No results for input(s): LIPASE, AMYLASE in the last 168 hours. No results for input(s): AMMONIA in the last 168 hours. Coagulation Profile: No results for input(s): INR, PROTIME in the last 168 hours. Cardiac Enzymes: No results for input(s): CKTOTAL, CKMB, CKMBINDEX, TROPONINI in the last 168 hours. BNP (last 3 results) No results for input(s): PROBNP in the last 8760 hours. HbA1C: No results for input(s): HGBA1C in the last 72 hours. CBG: No results for input(s): GLUCAP in the last 168 hours.  Lipid Profile: No results for input(s): CHOL, HDL, LDLCALC, TRIG, CHOLHDL, LDLDIRECT in the last 72 hours. Thyroid Function Tests: No results for input(s): TSH, T4TOTAL, FREET4, T3FREE, THYROIDAB in the last 72 hours. Anemia Panel: No results for input(s): VITAMINB12, FOLATE, FERRITIN, TIBC, IRON, RETICCTPCT in the last 72 hours. Sepsis Labs: No results for input(s): PROCALCITON, LATICACIDVEN in the last 168 hours.  Recent Results (from the past 240 hour(s))  Aerobic/Anaerobic Culture w Gram Stain (surgical/deep wound)     Status: Abnormal   Collection Time: 06/20/21  3:58 PM   Specimen: Back; Wound  Result Value Ref Range Status   Specimen Description   Final    BACK Performed at [facility], [address]  [place], [place] [address]    Special Requests   Final    NONE Performed at [facility], [address] [place], [place] [address]    Gram Stain   Final    FEW WBC PRESENT,BOTH PMN AND MONONUCLEAR RARE GRAM NEGATIVE RODS RARE GRAM VARIABLE COCCI    Culture (A)  Final    MULTIPLE ORGANISMS PRESENT, NONE PREDOMINANT NO STAPHYLOCOCCUS AUREUS ISOLATED NO GROUP A STREP (S.PYOGENES) ISOLATED NO ANAEROBES ISOLATED Performed at [facility], [place] [address], [place], [place] [address]    Report Status 06/25/2021 FINAL  Final         Radiology Studies: No results found.      Scheduled Meds:  amLODipine  10 mg Oral Daily   aspirin EC  81 mg Oral Daily   Chlorhexidine Gluconate Cloth  6 each Topical Daily   feeding supplement (NEPRO CARB STEADY)  237 mL Oral TID BM   metoprolol tartrate  25 mg Oral BID   multivitamin with minerals  1 tablet Oral Daily   pantoprazole  40 mg Oral Daily   polyethylene glycol  17 g Oral Daily   senna-docusate  1 tablet Oral BID   vitamin B-12  1,000 mcg Oral Daily   Continuous Infusions:  cefTRIAXone (ROCEPHIN)  IV       LOS: 35 days    Time spent: 46minutes with more than 50% on [place], MD [facility] P11/15/2022, 8:50 AM

## 2021-06-26 NOTE — Progress Notes (Signed)
Occupational Therapy Treatment Patient Details Name: Desiree Mitchell MRN: [id number] DOB: Dec 27, 1955 Today's Date: 06/26/2021   History of present illness Pt is a 65 y.o. female presenting to hospital 05/21/21 with AMS (found unresponsive by family).  Significant pressure sore present back of neck, buttocks, and coccyx.  Pt noted to be infested with bed bugs and roaches.  Pt admitted with acute respiratory failure, rhabdomyolysis, metabolic acidosis, acute kidney injury, sepsis, VA shunt malfunction, transaminitis, and posterior neck/buttocks/coccyx pressure injuries.  S/p removal ventriculoatrial shunt and placement of L parietal approach ventriculostomy 05/22/21; extubated 10/11; MRI brain 10/14 showing few scattered small acute infarcts involving different vascular territories (watershed type infarcts); TEE 10/18; and EVD removed 10/19.    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   OT comments  Pt seen for skilled co-treatment with PT. Pt is initially agreeable to therapeutic intervention. Attempted figure four position from bed level to don B socks but pt grimaces with knee flexion. She is able to reach for the R foot and attempts to pull on but unsuccessful and needing total A to don. She required mod - max A of 2 to EOB. Pt needing min A for static sitting balance but fatigues quickly. Pt endorses, " I need to pee" multiple times during session and reminded that she has foley. Pt standing with max A of 2 from EOB with pt's feet on wooden block for safety and RW utilized. Pt unable to lift head up to assist with upright posture and then quickly requests to return to bed. Pt has been unable to progress in last few sessions. OT to re-assess next session for pt's appropriateness.   Recommendations for follow up therapy are one component of a multi-disciplinary discharge planning process, led by the attending physician.   Recommendations may be updated based on patient status, additional functional criteria and insurance authorization.    Follow Up Recommendations  Skilled nursing-short term rehab (<3 hours/day)    Assistance Recommended at Discharge Frequent or constant Supervision/Assistance  Equipment Recommendations  Other (comment) (defer to next venue of care)       Precautions / Restrictions Precautions Precautions: Fall Precaution Comments: Aspiration; gastric tube; R chest port;  buttocks pressure injuries       Mobility Bed Mobility Overal bed mobility: Needs Assistance Bed Mobility: Supine to Sit;Sit to Supine Rolling: Max assist   Supine to sit: +2 for physical assistance;Mod assist Sit to supine: Max assist;+2 for physical assistance   General bed mobility comments: Requires trunk and BLE support. Pt rolled and placed on bed pan at end of session.    Transfers Overall transfer level: Needs assistance Equipment used: 2 person hand held assist Transfers: Sit to/from Stand Sit to Stand: Max assist;+2 physical assistance           General transfer comment: Able to clear buttocks once from EOB and quickly requesting to return to bed     Balance Overall balance assessment: Needs assistance Sitting-balance support: Bilateral upper extremity supported;Feet supported Sitting balance-Leahy Scale: Fair Sitting balance - Comments: able to maintain balance during session but posterior lean w/ fatigue Postural control: Posterior lean Standing balance support: Bilateral upper extremity supported Standing balance-Leahy Scale: Zero                             ADL either performed or assessed with clinical judgement   ADL Overall  ADL's : Needs assistance/impaired                     Lower Body Dressing: Bed level;Total assistance Lower Body Dressing Details (indicate cue type and reason): to don/doff socks                    Extremity/Trunk Assessment  Upper Extremity Assessment Upper Extremity Assessment: Generalized weakness   Lower Extremity Assessment Lower Extremity Assessment: Generalized weakness        Vision Patient Visual Report: No change from baseline            Cognition Arousal/Alertness: Awake/alert Behavior During Therapy: WFL for tasks assessed/performed Overall Cognitive Status: No family/caregiver present to determine baseline cognitive functioning                                 General Comments: Increased time for task processing. Continues to have sullen mood but participates w/ extensive encouragement                     Pertinent Vitals/ Pain       Pain Assessment: Faces Faces Pain Scale: Hurts a little bit Pain Location: buttocks and R knee Pain Descriptors / Indicators: Discomfort;Grimacing Pain Intervention(s): Limited activity within patient's tolerance;Monitored during session;Repositioned         Frequency  Min 2X/week        Progress Toward Goals  OT Goals(current goals can now be found in the care plan section)  Progress towards OT goals: OT to reassess next treatment  Acute Rehab OT Goals Patient Stated Goal: to go home OT Goal Formulation: With patient Time For Goal Achievement: 07/06/21  Plan Discharge plan remains appropriate;Frequency remains appropriate;Other (comment)    Co-evaluation    PT/OT/SLP Co-Evaluation/Treatment: Yes Reason for Co-Treatment: Complexity of the patient's impairments (multi-system involvement);Necessary to address cognition/behavior during functional activity;For patient/therapist safety;To address functional/ADL transfers PT goals addressed during session: Mobility/safety with mobility;Balance OT goals addressed during session: ADL's and self-care      AM-PAC OT "6 Clicks" Daily Activity     Outcome Measure   Help from another person eating meals?: A Little Help from another person taking care of personal grooming?: A  Little Help from another person toileting, which includes using toliet, bedpan, or urinal?: Total Help from another person bathing (including washing, rinsing, drying)?: A Lot Help from another person to put on and taking off regular upper body clothing?: A Lot Help from another person to put on and taking off regular lower body clothing?: Total 6 Click Score: 12    End of Session Equipment Utilized During Treatment: Rolling walker (2 wheels)  OT Visit Diagnosis: Muscle weakness (generalized) (M62.81);Adult, failure to thrive (R62.7);Other symptoms and signs involving cognitive function   Activity Tolerance Patient limited by fatigue   Patient Left in bed;with call bell/phone within reach;with bed alarm set   Nurse Communication Mobility status;Other (comment) (placed on bed pan)        Time: 1343-1410 OT Time Calculation (min): 27 min  Charges: OT General Charges $OT Visit: 1 Visit OT Treatments $Self Care/Home Management : 8-22 mins  Darleen Crocker, MS, OTR/L , CBIS ascom [phone]  06/26/21, 2:49 PM

## 2021-06-26 NOTE — Progress Notes (Signed)
Nutrition Follow-up  DOCUMENTATION CODES:   Obesity unspecified  INTERVENTION:   -Continue Nepro Shake po TID, each supplement provides 425 kcal and 19 grams protein  -Continue MVI with minerals daily -Continue Magic cup BID with meals, each supplement provides 290 kcal and 9 grams of protein   NUTRITION DIAGNOSIS:   Inadequate oral intake related to acute illness as evidenced by NPO status.  Progressing; advanced to PO diet on 06/12/21  GOAL:   Patient will meet greater than or equal to 90% of their needs  Progressing   MONITOR:   PO intake, Supplement acceptance, Diet advancement, Labs, Weight trends, Skin, I & O's  REASON FOR ASSESSMENT:   Consult Assessment of nutrition requirement/status  ASSESSMENT:   65 y/o female with h/o HTN, asthma, breast cancer, CVA and subarachnoid hemorrhage s/p shunt placement for management of hydrocephalus > 10 years ago who is now admitted with shunt malfunction, sepsis and AMS now s/p left parietal approach ventriculostomy 10/11  10/28- refused PEG placement, transitioned to comfort care 10/31- comfort care rescinded  11/1- s/p BSE- advanced to dysphagia 2 diet with nectar thick liquids  Reviewed I/O's: +120 ml x 24 hours and -3.9 L since 06/12/21   Pt remains with poor oral intake. Noted meal completions 15-25%. She is consuming Nepro supplements.   Pt family rescinded comfort care, with hopes that pt will improve.   Per TOC notes, pt medically stable for discharge and awaiting insurance authorization for SNF.   Medications reviewed and include miralax, senokot, and vitamin B-12.  Labs reviewed.   Diet Order:   Diet Order             DIET DYS 2 Room service appropriate? Yes with Assist; Fluid consistency: Nectar Thick  Diet effective now                   EDUCATION NEEDS:   No education needs have been identified at this time  Skin:  Skin Assessment: Skin Integrity Issues: Skin Integrity Issues::  Unstageable DTI: rt buttocks, cervical Stage II: coccyx Unstageable: bilateral buttocks Incisions: closed head and lt thorat  Last BM:  06/25/21  Height:   Ht Readings from Last 1 Encounters:  06/04/21 5\' 2"  (1.575 m)    Weight:   Wt Readings from Last 1 Encounters:  06/25/21 104.5 kg    Ideal Body Weight:  50 kg  BMI:  Body mass index is 42.12 kg/m.  Estimated Nutritional Needs:   Kcal:  1800-2100kcal/day  Protein:  90-105g/day  Fluid:  1.5-1.8L/day    Loistine Chance, RD, LDN, [place] Registered Dietitian II Certified Diabetes Care and Education Specialist Please refer to AMION for RD and/or RD on-call/weekend/after hours pager

## 2021-06-27 DIAGNOSIS — A419 Sepsis, unspecified organism: Secondary | ICD-10-CM | POA: Diagnosis not present

## 2021-06-27 DIAGNOSIS — R6520 Severe sepsis without septic shock: Secondary | ICD-10-CM | POA: Diagnosis not present

## 2021-06-27 DIAGNOSIS — J9601 Acute respiratory failure with hypoxia: Secondary | ICD-10-CM | POA: Diagnosis not present

## 2021-06-27 MED ORDER — DOCUSATE SODIUM 100 MG PO CAPS
100.0000 mg | ORAL_CAPSULE | Freq: Two times a day (BID) | ORAL | Status: DC
  Administered 2021-06-27 – 2021-07-11 (×29): 100 mg via ORAL
  Filled 2021-06-27 (×28): qty 1

## 2021-06-27 NOTE — Progress Notes (Signed)
PROGRESS NOTE    Desiree Mitchell  QPR:[id number] DOB: 1956-07-06 DOA: 05/21/2021 PCP: Pcp, No    Brief Narrative:  Patient is a 65 year old female with history of breast cancer status post bilateral mastectomy, history of VA shunt  and prior subarachnoid hemorrhage/hydrocephalus, CVA with residual right-sided deficit, asthma, hypertension who presented at emergency department after she was found unresponsive by family.  On presentation, she was hypoxic, hypotensive.  She was also found to be infested with bedbugs and cockroaches.  CT head without contrast showed development of mild hydrocephalus, periventricular edema associated with malpositioned left VA shunt.  She was seen by neurosurgery and was urgently  taken to the OR for removal of her VA shunt and placement of external ventricular drain.  Hospital course also remarkable for bacteremia for which ID was following.  Patient was transferred to [facility] on 05/30/2021.  Several discussions were held with family about goals of care.  Initially she was on comfort care but since her mental status slowly improved, family wanted to discontinue comfort care,then family decided on placing her in a skilled nursing facility.   Hospice agency was also following and recommending hospice support on long-term care.  Since last several days, she has remained hemodynamically stable, overall comfortable.  Medically stable for discharge to SNF whenever possible.TOC following   Assessment & Plan:   Principal Problem:   Sepsis ([place]) Active Problems:   Endotracheally intubated   On mechanically assisted ventilation (HCC)   Shunt malfunction   Rhabdomyolysis   AKI (acute kidney injury) ([place])   Increased anion gap metabolic acidosis   Acute encephalopathy   Transaminitis   Pressure injury of skin   Acute embolic stroke [facility])   Palliative care by specialist   DNR (do not resuscitate)  New UTI. Urine culture grow gram-negative rods, continue Rocephin started  yesterday.  Severe sepsis/Proteus/staph epidermidis septicemia: .  To be secondary to wounds on sacrum.  Patient has been evaluated by ID, completed antibiotics with ceftriaxone and linezolid.  Postop acute hypoxemic respiratory failure.   VP shunt malfunction. 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 History of stroke. Hypoxemia has resolved.  VP shunt was removed.  B12 deficiency. Continue oral supplement.  Dysphagia. Continue dysphagia diet.    DVT prophylaxis: SCDs Code Status: DNR Family Communication:  Disposition Plan:    Status is: Inpatient  Remains inpatient appropriate because: Unsafe discharge plan, still on IV antibiotics.        I/O last 3 completed shifts: In: 365.3 [P.O.:240; IV Piggyback:125.3] Out: 9935 [Urine:1220] Total I/O In: 360 [P.O.:360] Out: -      Consultants:  ID, Neurosurgery  Procedures: VP shunt removal  Antimicrobials: Ceftriaxone.  Subjective: Patient is very confused, speech is slurred. Denies any short of breath or cough. No fever or chills. No dysuria hematuria     Objective: Vitals:   06/27/21 0107 06/27/21 0500 06/27/21 0736 06/27/21 1130  BP:  (!) 141/73 (!) 162/79 (!) 143/84  Pulse: 96 81 85 76  Resp: 20 19 18 18   Temp:  98.8 F (37.1 C) 98 F (36.7 C) 98.4 F (36.9 C)  TempSrc:  Oral Oral Oral  SpO2:  95% 96% 100%  Weight:  107.5 kg    Height:        Intake/Output Summary (Last 24 hours) at 06/27/2021 1224 Last data filed at 06/27/2021 1014 Gross per 24 hour  Intake 485.33 ml  Output 1220 ml  Net -734.67 ml   Filed Weights   06/24/21  0500 06/25/21 0500 06/27/21 0500  Weight: 102 kg 104.5 kg 107.5 kg    Examination:  General exam: Appears calm and comfortable  Respiratory system: Clear to auscultation. Respiratory effort normal. Cardiovascular system: S1 & S2 heard, RRR. No JVD, murmurs, rubs, gallops or clicks. No pedal edema. Gastrointestinal system: Abdomen is  nondistended, soft and nontender. No organomegaly or masses felt. Normal bowel sounds heard. Central nervous system: Alert and oriented x2. No focal neurological deficits. Extremities: Symmetric 5 x 5 power. Skin: No rashes, lesions or ulcers     Data Reviewed: I have personally reviewed following labs and imaging studies  CBC: No results for input(s): WBC, NEUTROABS, HGB, HCT, MCV, PLT in the last 168 hours. Basic Metabolic Panel: No results for input(s): NA, K, CL, CO2, GLUCOSE, BUN, CREATININE, CALCIUM, MG, PHOS in the last 168 hours. GFR: Estimated Creatinine Clearance: 79.1 mL/min (by C-G formula based on SCr of 0.84 mg/dL). Liver Function Tests: No results for input(s): AST, ALT, ALKPHOS, BILITOT, PROT, ALBUMIN in the last 168 hours. No results for input(s): LIPASE, AMYLASE in the last 168 hours. No results for input(s): AMMONIA in the last 168 hours. Coagulation Profile: No results for input(s): INR, PROTIME in the last 168 hours. Cardiac Enzymes: No results for input(s): CKTOTAL, CKMB, CKMBINDEX, TROPONINI in the last 168 hours. BNP (last 3 results) No results for input(s): PROBNP in the last 8760 hours. HbA1C: No results for input(s): HGBA1C in the last 72 hours. CBG: No results for input(s): GLUCAP in the last 168 hours. Lipid Profile: No results for input(s): CHOL, HDL, LDLCALC, TRIG, CHOLHDL, LDLDIRECT in the last 72 hours. Thyroid Function Tests: No results for input(s): TSH, T4TOTAL, FREET4, T3FREE, THYROIDAB in the last 72 hours. Anemia Panel: No results for input(s): VITAMINB12, FOLATE, FERRITIN, TIBC, IRON, RETICCTPCT in the last 72 hours. Sepsis Labs: No results for input(s): PROCALCITON, LATICACIDVEN in the last 168 hours.  Recent Results (from the past 240 hour(s))  Aerobic/Anaerobic Culture w Gram Stain (surgical/deep wound)     Status: Abnormal   Collection Time: 06/20/21  3:58 PM   Specimen: Back; Wound  Result Value Ref Range Status   Specimen  Description   Final    BACK Performed at [facility], [address] [place], [place] [address]    Special Requests   Final    NONE Performed at [facility], [address] [place], [place] [address]    Gram Stain   Final    FEW WBC PRESENT,BOTH PMN AND MONONUCLEAR RARE GRAM NEGATIVE RODS RARE GRAM VARIABLE COCCI    Culture (A)  Final    MULTIPLE ORGANISMS PRESENT, NONE PREDOMINANT NO STAPHYLOCOCCUS AUREUS ISOLATED NO GROUP A STREP (S.PYOGENES) ISOLATED NO ANAEROBES ISOLATED Performed at [facility], [place] [address], [place], [place] [address]    Report Status 06/25/2021 FINAL  Final  Urine Culture     Status: Abnormal (Preliminary result)   Collection Time: 06/25/21  2:04 PM   Specimen: Urine, Catheterized  Result Value Ref Range Status   Specimen Description   Final    URINE, CATHETERIZED Performed at [facility], [address], [place], [place] [address]    Special Requests   Final    NONE Performed at [facility], [address] [place], [place] [address]    Culture (A)  Final    >=100,000 COLONIES/mL GRAM NEGATIVE RODS SUSCEPTIBILITIES TO FOLLOW CULTURE REINCUBATED FOR BETTER GROWTH Performed at [facility], [place]  [address], [place], [place] [address]    Report Status PENDING  Incomplete         Radiology Studies: No results found.      Scheduled Meds:  amLODipine  10 mg Oral Daily   aspirin EC  81 mg Oral Daily   Chlorhexidine Gluconate Cloth  6 each Topical Daily   docusate sodium  100 mg Oral BID   feeding supplement (NEPRO CARB STEADY)  237 mL Oral TID BM   metoprolol tartrate  25 mg Oral BID   multivitamin with minerals  1 tablet Oral Daily   pantoprazole  40 mg Oral Daily   senna-docusate  1 tablet Oral BID   vitamin B-12  1,000 mcg Oral Daily   Continuous Infusions:  cefTRIAXone (ROCEPHIN)  IV 1 g (06/27/21 0927)     LOS: 36 days    Time spent: 26 minutes    Sharen Hones,  MD [facility]   To contact the attending provider between 7A-7P or the covering provider during after hours 7P-7A, please log into the web site [email or web address] and access using universal [place] password for that web site. If you do not have the password, please call the hospital operator.  06/27/2021, 12:24 PM

## 2021-06-27 NOTE — Plan of Care (Signed)
Patient orientedx2-3. VSS. Dakin's solution completed for wound care, changed b/l buttocks wounds with saline moistened gauze, pt tolerated well. Q4hr bladder scans maintained, pt voiding spontaneously. X1 BM overnight. Difficult to keep dressings clean from urine, purewick in place. Q2hr turns maintained, rounding performed. Continuing with plan of care.   Problem: Education: Goal: Knowledge of General Education information will improve Description: Including pain rating scale, medication(s)/side effects and non-pharmacologic comfort measures Outcome: Progressing   Problem: Health Behavior/Discharge Planning: Goal: Ability to manage health-related needs will improve Outcome: Progressing   Problem: Clinical Measurements: Goal: Ability to maintain clinical measurements within normal limits will improve Outcome: Progressing Goal: Will remain free from infection Outcome: Progressing Goal: Diagnostic test results will improve Outcome: Progressing Goal: Respiratory complications will improve Outcome: Progressing Goal: Cardiovascular complication will be avoided Outcome: Progressing   Problem: Nutrition: Goal: Adequate nutrition will be maintained Outcome: Progressing   Problem: Coping: Goal: Level of anxiety will decrease Outcome: Progressing   Problem: Elimination: Goal: Will not experience complications related to bowel motility Outcome: Progressing Goal: Will not experience complications related to urinary retention Outcome: Progressing   Problem: Pain Managment: Goal: General experience of comfort will improve Outcome: Progressing   Problem: Safety: Goal: Ability to remain free from injury will improve Outcome: Progressing   Problem: Skin Integrity: Goal: Risk for impaired skin integrity will decrease Outcome: Progressing

## 2021-06-28 ENCOUNTER — Encounter: Payer: Self-pay | Admitting: Neurosurgery

## 2021-06-28 DIAGNOSIS — R6520 Severe sepsis without septic shock: Secondary | ICD-10-CM | POA: Diagnosis not present

## 2021-06-28 DIAGNOSIS — A419 Sepsis, unspecified organism: Secondary | ICD-10-CM | POA: Diagnosis not present

## 2021-06-28 DIAGNOSIS — J9601 Acute respiratory failure with hypoxia: Secondary | ICD-10-CM | POA: Diagnosis not present

## 2021-06-28 DIAGNOSIS — T85618D Breakdown (mechanical) of other specified internal prosthetic devices, implants and grafts, subsequent encounter: Secondary | ICD-10-CM | POA: Diagnosis not present

## 2021-06-28 LAB — URINE CULTURE: Culture: >=100000 — AB

## 2021-06-28 MED ORDER — CITALOPRAM HYDROBROMIDE 10 MG/5ML PO SOLN: 10.0000 mg | Freq: Every day | ORAL | Status: DC

## 2021-06-28 MED ORDER — FOSFOMYCIN TROMETHAMINE 3 G PO PACK
3.0000 g | PACK | Freq: Once | ORAL | Status: AC
  Administered 2021-06-28: 3 g via ORAL
  Filled 2021-06-28: qty 3

## 2021-06-28 MED ORDER — CITALOPRAM HYDROBROMIDE 20 MG PO TABS
10.0000 mg | ORAL_TABLET | Freq: Every day | ORAL | Status: DC
  Administered 2021-06-28 – 2021-07-11 (×14): 10 mg via ORAL
  Filled 2021-06-28 (×14): qty 1

## 2021-06-28 NOTE — Progress Notes (Signed)
PROGRESS NOTE    Desiree Mitchell  TMH:[id number] DOB: 1955-11-19 DOA: 05/21/2021 PCP: Pcp, No    Brief Narrative:   Patient is a 65 year old female with history of breast cancer status post bilateral mastectomy, history of VA shunt  and prior subarachnoid hemorrhage/hydrocephalus, CVA with residual right-sided deficit, asthma, hypertension who presented at emergency department after she was found unresponsive by family.  On presentation, she was hypoxic, hypotensive.  She was also found to be infested with bedbugs and cockroaches.  CT head without contrast showed development of mild hydrocephalus, periventricular edema associated with malpositioned left VA shunt.  She was seen by neurosurgery and was urgently  taken to the OR for removal of her VA shunt and placement of external ventricular drain.  Hospital course also remarkable for bacteremia for which ID was following.  Patient was transferred to [facility] on 05/30/2021.  Several discussions were held with family about goals of care.  Initially she was on comfort care but since her mental status slowly improved, family wanted to discontinue comfort care,then family decided on placing her in a skilled nursing facility.   Hospice agency was also following and recommending hospice support on long-term care.  Since last several days, she has remained hemodynamically stable, overall comfortable.  Medically stable for discharge to SNF whenever possible.TOC following  Assessment & Plan:   Principal Problem:   Sepsis ([place]) Active Problems:   Endotracheally intubated   On mechanically assisted ventilation (HCC)   Shunt malfunction   Rhabdomyolysis   AKI (acute kidney injury) ([place])   Increased anion gap metabolic acidosis   Acute encephalopathy   Transaminitis   Pressure injury of skin   Acute embolic stroke [facility])   Palliative care by specialist   DNR (do not resuscitate)  New UTI. Urine culture grew E. coli and a small colonies of Pseudomonas.   Discussed with pharmacy, will give a dose of fosfomycin and discontinue all antibiotics.  Severe sepsis/Proteus/staph epidermidis septicemia: This is secondary to wounds on sacrum.  Condition had resolved.  Depression. Patient has depressed mood, poor motivation.  We will start lower dose citalopram.  Postop acute hypoxemic respiratory failure.   VP shunt malfunction. 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 History of stroke. Hypoxemia has resolved.  VP shunt was removed.   B12 deficiency. Continue oral supplement.  Dysphagia. Continue dysphagia diet.   DVT prophylaxis: SCDs Code Status: DNR Family Communication: called, not bale to reach daughter Disposition Plan:      Status is: Inpatient   Remains inpatient appropriate because: Unsafe discharge plan,         I/O last 3 completed shifts: In: 1084.7 [P.O.:1000; I.V.:10; IV Piggyback:74.7] Out: 1170 [Urine:1170] Total I/O In: 120 [P.O.:120] Out: 250 [Urine:250]     Subjective: Patient appears depressed, denies any short of breath or cough. Abdominal pain nausea vomiting. No dysuria hematuria. No fever or chills  Objective: Vitals:   06/28/21 0324 06/28/21 0818 06/28/21 0955 06/28/21 1148  BP:  128/80 130/75 138/78  Pulse:  86 81 87  Resp:  16  16  Temp:  98.3 F (36.8 C)  98.5 F (36.9 C)  TempSrc:  Oral  Oral  SpO2:  96%  96%  Weight: 98 kg     Height:        Intake/Output Summary (Last 24 hours) at 06/28/2021 1237 Last data filed at 06/28/2021 1150 Gross per 24 hour  Intake 844.67 ml  Output 700 ml  Net 144.67 ml   Danley Danker  Weights   06/25/21 0500 06/27/21 0500 06/28/21 0324  Weight: 104.5 kg 101.5 kg 98 kg    Examination:  General exam: Appears calm and comfortable  Respiratory system: Clear to auscultation. Respiratory effort normal. Cardiovascular system: S1 & S2 heard, RRR. No JVD, murmurs, rubs, gallops or clicks. No pedal edema. Gastrointestinal system:  Abdomen is nondistended, soft and nontender. No organomegaly or masses felt. Normal bowel sounds heard. Central nervous system: Alert and oriented x1. No focal neurological deficits. Extremities: Symmetric 5 x 5 power. Skin: No rashes, lesions or ulcers Psychiatry: Depressed mood, flat affect.   Data Reviewed: I have personally reviewed following labs and imaging studies  CBC: No results for input(s): WBC, NEUTROABS, HGB, HCT, MCV, PLT in the last 168 hours. Basic Metabolic Panel: No results for input(s): NA, K, CL, CO2, GLUCOSE, BUN, CREATININE, CALCIUM, MG, PHOS in the last 168 hours. GFR: Estimated Creatinine Clearance: 75 mL/min (by C-G formula based on SCr of 0.84 mg/dL). Liver Function Tests: No results for input(s): AST, ALT, ALKPHOS, BILITOT, PROT, ALBUMIN in the last 168 hours. No results for input(s): LIPASE, AMYLASE in the last 168 hours. No results for input(s): AMMONIA in the last 168 hours. Coagulation Profile: No results for input(s): INR, PROTIME in the last 168 hours. Cardiac Enzymes: No results for input(s): CKTOTAL, CKMB, CKMBINDEX, TROPONINI in the last 168 hours. BNP (last 3 results) No results for input(s): PROBNP in the last 8760 hours. HbA1C: No results for input(s): HGBA1C in the last 72 hours. CBG: No results for input(s): GLUCAP in the last 168 hours. Lipid Profile: No results for input(s): CHOL, HDL, LDLCALC, TRIG, CHOLHDL, LDLDIRECT in the last 72 hours. Thyroid Function Tests: No results for input(s): TSH, T4TOTAL, FREET4, T3FREE, THYROIDAB in the last 72 hours. Anemia Panel: No results for input(s): VITAMINB12, FOLATE, FERRITIN, TIBC, IRON, RETICCTPCT in the last 72 hours. Sepsis Labs: No results for input(s): PROCALCITON, LATICACIDVEN in the last 168 hours.  Recent Results (from the past 240 hour(s))  Aerobic/Anaerobic Culture w Gram Stain (surgical/deep wound)     Status: Abnormal   Collection Time: 06/20/21  3:58 PM   Specimen: Back; Wound   Result Value Ref Range Status   Specimen Description   Final    BACK Performed at [facility], [address] [place], [place] [address]    Special Requests   Final    NONE Performed at [facility], [address] [place], [place] [address]    Gram Stain   Final    FEW WBC PRESENT,BOTH PMN AND MONONUCLEAR RARE GRAM NEGATIVE RODS RARE GRAM VARIABLE COCCI    Culture (A)  Final    MULTIPLE ORGANISMS PRESENT, NONE PREDOMINANT NO STAPHYLOCOCCUS AUREUS ISOLATED NO GROUP A STREP (S.PYOGENES) ISOLATED NO ANAEROBES ISOLATED Performed at [facility], [place] [address] [place], [place] [address]    Report Status 06/25/2021 FINAL  Final  Urine Culture     Status: Abnormal   Collection Time: 06/25/21  2:04 PM   Specimen: Urine, Catheterized  Result Value Ref Range Status   Specimen Description   Final    URINE, CATHETERIZED Performed at [facility], [address], [place], [place] [address]    Special Requests   Final    NONE Performed at [facility], [address] [place], [place] [address]    Culture (A)  Final    >=100,000 COLONIES/mL ESCHERICHIA COLI 20,000 COLONIES/mL PSEUDOMONAS AERUGINOSA    Report Status 06/28/2021 FINAL  Final   Organism ID, Bacteria ESCHERICHIA COLI (A)  Final   Organism ID, Bacteria PSEUDOMONAS AERUGINOSA (A)  Final      Susceptibility   Escherichia coli - MIC*    AMPICILLIN >=32 RESISTANT Resistant     CEFAZOLIN <=4 SENSITIVE Sensitive     CEFEPIME <=0.12 SENSITIVE Sensitive     CEFTRIAXONE <=0.25 SENSITIVE Sensitive     CIPROFLOXACIN <=0.25 SENSITIVE Sensitive     GENTAMICIN <=1 SENSITIVE Sensitive     IMIPENEM <=0.25 SENSITIVE Sensitive     NITROFURANTOIN <=16 SENSITIVE Sensitive     TRIMETH/SULFA <=20 SENSITIVE Sensitive     AMPICILLIN/SULBACTAM >=32 RESISTANT Resistant     PIP/TAZO 64 INTERMEDIATE Intermediate     * >=100,000 COLONIES/mL ESCHERICHIA COLI   Pseudomonas aeruginosa - MIC*     CEFTAZIDIME >=64 RESISTANT Resistant     CIPROFLOXACIN <=0.25 SENSITIVE Sensitive     GENTAMICIN <=1 SENSITIVE Sensitive     IMIPENEM 2 SENSITIVE Sensitive     CEFEPIME >=32 RESISTANT Resistant     * 20,000 COLONIES/mL PSEUDOMONAS AERUGINOSA         Radiology Studies: No results found.      Scheduled Meds:  amLODipine  10 mg Oral Daily   aspirin EC  81 mg Oral Daily   Chlorhexidine Gluconate Cloth  6 each Topical Daily   citalopram  10 mg Oral Daily   docusate sodium  100 mg Oral BID   feeding supplement (NEPRO CARB STEADY)  237 mL Oral TID BM   fosfomycin  3 g Oral Once   metoprolol tartrate  25 mg Oral BID   multivitamin with minerals  1 tablet Oral Daily   pantoprazole  40 mg Oral Daily   senna-docusate  1 tablet Oral BID   vitamin B-12  1,000 mcg Oral Daily   Continuous Infusions:   LOS: 37 days    Time spent: 22 minutes    Sharen Hones, MD [facility]   To contact the attending provider between 7A-7P or the covering provider during after hours 7P-7A, please log into the web site [email or web address] and access using universal [place] password for that web site. If you do not have the password, please call the hospital operator.  06/28/2021, 12:37 PM

## 2021-06-29 DIAGNOSIS — R6520 Severe sepsis without septic shock: Secondary | ICD-10-CM | POA: Diagnosis not present

## 2021-06-29 DIAGNOSIS — G9340 Encephalopathy, unspecified: Secondary | ICD-10-CM | POA: Diagnosis not present

## 2021-06-29 DIAGNOSIS — A419 Sepsis, unspecified organism: Secondary | ICD-10-CM | POA: Diagnosis not present

## 2021-06-29 DIAGNOSIS — I639 Cerebral infarction, unspecified: Secondary | ICD-10-CM | POA: Diagnosis not present

## 2021-06-29 MED ORDER — DAKINS (1/4 STRENGTH) 0.125 % EX SOLN
Freq: Every day | CUTANEOUS | Status: DC
  Filled 2021-06-29: qty 473

## 2021-06-29 MED ORDER — ORAL CARE MOUTH RINSE
15.0000 mL | Freq: Two times a day (BID) | OROMUCOSAL | Status: DC
  Administered 2021-06-29 – 2021-07-11 (×21): 15 mL via OROMUCOSAL

## 2021-06-29 NOTE — Progress Notes (Signed)
SLP F/U Note  Patient Details Name: Desiree Mitchell MRN: [id number] DOB: 02-02-1956   Cancelled treatment:       Reason Eval/Treat Not Completed:  (chart reviewed; pt still pending D/C to SNF (insurance delay)) Pt continues to do well w/ current rec'd diet; general aspiration precautions. Pt will benefit from skilled ST f/u at next venue of care(SNF) as planned per Team recommendations d/t lengthy hospitalization and Baseline neuro history(history of VA shunt  and prior subarachnoid hemorrhage/hydrocephalus, CVA with residual right-sided deficits).    Orinda Kenner, MS, CCC-SLP Speech Language Pathologist Rehab Services [phone] [facility] 06/29/2021, 12:40 PM

## 2021-06-29 NOTE — Progress Notes (Signed)
PT Cancellation Note  Patient Details Name: Desiree Mitchell MRN: [id number] DOB: 01-Feb-1956   Cancelled Treatment:    Reason Eval/Treat Not Completed:  (Treatment session attempted.  RN at bedside; just placed patient on bedpan.  Will re-attempt at later time/date as medically appropriate and available.)   Shaneisha Burkel H. Owens Shark, PT, DPT, NCS 06/29/21, 10:24 AM [phone]

## 2021-06-29 NOTE — Progress Notes (Signed)
Physical Therapy Treatment Patient Details Name: Desiree Mitchell MRN: [id number] DOB: [birth year] Today's Date: 06/29/2021   History of Present Illness Pt is a 65 y.o. female presenting to hospital 05/21/21 with AMS (found unresponsive by family).  Significant pressure sore present back of neck, buttocks, and coccyx.  Pt noted to be infested with bed bugs and roaches.  Pt admitted with acute respiratory failure, rhabdomyolysis, metabolic acidosis, acute kidney injury, sepsis, VA shunt malfunction, transaminitis, and posterior neck/buttocks/coccyx pressure injuries.  S/p removal ventriculoatrial shunt and placement of L parietal approach ventriculostomy 05/22/21; extubated 10/11; MRI brain 10/14 showing few scattered small acute infarcts involving different vascular territories (watershed type infarcts); TEE 10/18; and EVD removed 10/19.    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    PT Comments    PT /OT co-treat 2/2 to pt requiring +2 assistance for all mobility and transfers. Unsafe to even attempt ambulation due to pt weakness and poor standing abilities. Pt is alert throughout session and is able to follow simple one step commands however needs constant encouragement for full participation. She required +2 assist to exit L side of bed. Stood 3 x EOB with max +2 assist x ~ 5 sec. Overall pt tolerated session well but somewhat self limiting. Recommend DC to rehab to address deficits while maximizing independence with ADLs.    Recommendations for follow up therapy are one component of a multi-disciplinary discharge planning process, led by the attending physician.  Recommendations may be updated based on patient status, additional functional criteria and insurance authorization.  Follow Up Recommendations  Skilled nursing-short term rehab (<3 hours/day) (LTC may be better option if unable to go to STR)     Assistance  Recommended at Discharge Frequent or constant Supervision/Assistance  Equipment Recommendations  None recommended by PT       Precautions / Restrictions Precautions Precautions: Fall Precaution Comments: Aspiration; gastric tube; R chest port;  buttocks pressure injuries Restrictions Weight Bearing Restrictions: No     Mobility  Bed Mobility Overal bed mobility: Needs Assistance Bed Mobility: Supine to Sit;Sit to Supine;Rolling Rolling: Max assist   Supine to sit: +2 for physical assistance;Mod assist Sit to supine: Max assist;+2 for physical assistance   General bed mobility comments: pt continues to require +2 assistance for mobility in bed. Author questions pts effort throughout session    Transfers Overall transfer level: Needs assistance Equipment used: 2 person hand held assist (blocking knees) Transfers: Sit to/from Stand Sit to Stand: Max assist;+2 physical assistance          Lateral/Scoot Transfers: Max assist;From elevated surface General transfer comment: Sit <>stand x 3 reps with able to give forth some effort but needing maximal encouragement and cuing. Continues to be severely limited but author questions effort put forth. less assistance required on first stand but still required +2.    Ambulation/Gait    General Gait Details: unable/unsafe to attempt. pt extremely weak and unsafe to advance away form EOB    Balance Overall balance assessment: Needs assistance Sitting-balance support: Bilateral upper extremity supported;Feet supported Sitting balance-Leahy Scale: Poor Sitting balance - Comments: pt requires constant +1 assistance to prevent posterior LOB   Standing balance support: Bilateral upper extremity supported Standing balance-Leahy Scale: Zero Standing balance comment: +2 asist to even achieve standing. only stood ~ 5 sec       Cognition Arousal/Alertness: Awake/alert Behavior During Therapy: WFL for tasks assessed/performed Overall  Cognitive  Status: No family/caregiver present to determine baseline cognitive functioning      General Comments: Increased time for task processing and maximal encouragemnt for motivation               Pertinent Vitals/Pain Pain Assessment: No/denies pain     PT Goals (current goals can now be found in the care plan section) Acute Rehab PT Goals Patient Stated Goal: " get better." Progress towards PT goals: Not progressing toward goals - comment    Frequency    Min 2X/week      PT Plan Current plan remains appropriate    Co-evaluation   Reason for Co-Treatment: Complexity of the patient's impairments (multi-system involvement);Necessary to address cognition/behavior during functional activity;For patient/therapist safety;To address functional/ADL transfers PT goals addressed during session: Mobility/safety with mobility;Balance;Strengthening/ROM OT goals addressed during session: ADL's and self-care      AM-PAC PT "6 Clicks" Mobility   Outcome Measure  Help needed turning from your back to your side while in a flat bed without using bedrails?: A Lot Help needed moving from lying on your back to sitting on the side of a flat bed without using bedrails?: A Lot Help needed moving to and from a bed to a chair (including a wheelchair)?: Total Help needed standing up from a chair using your arms (e.g., wheelchair or bedside chair)?: Total Help needed to walk in hospital room?: Total Help needed climbing 3-5 steps with a railing? : Total 6 Click Score: 8    End of Session Equipment Utilized During Treatment: Gait belt Activity Tolerance: Patient limited by fatigue Patient left: in bed;with call bell/phone within reach Nurse Communication: Mobility status PT Visit Diagnosis: Other abnormalities of gait and mobility (R26.89);Muscle weakness (generalized) (M62.81);Difficulty in walking, not elsewhere classified (R26.2)     Time: 6144-3154 PT Time Calculation (min)  (ACUTE ONLY): 39 min  Charges:  $Therapeutic Activity: 23-37 mins                    Julaine Fusi PTA 06/29/21, 4:58 PM

## 2021-06-29 NOTE — Progress Notes (Signed)
PROGRESS NOTE    Desiree Mitchell  VEH:[id number] DOB: Sep 18, 1955 DOA: 05/21/2021 PCP: Pcp, No    Brief Narrative:  Patient is a 65 year old female with history of breast cancer status post bilateral mastectomy, history of VA shunt  and prior subarachnoid hemorrhage/hydrocephalus, CVA with residual right-sided deficit, asthma, hypertension who presented at emergency department after she was found unresponsive by family.  On presentation, she was hypoxic, hypotensive.  She was also found to be infested with bedbugs and cockroaches.  CT head without contrast showed development of mild hydrocephalus, periventricular edema associated with malpositioned left VA shunt.  She was seen by neurosurgery and was urgently  taken to the OR for removal of her VA shunt and placement of external ventricular drain.  Hospital course also remarkable for bacteremia for which ID was following.  Patient was transferred to [facility] on 05/30/2021.  Several discussions were held with family about goals of care.  Initially she was on comfort care but since her mental status slowly improved, family wanted to discontinue comfort care,then family decided on placing her in a skilled nursing facility.   Hospice agency was also following and recommending hospice support on long-term care.  Since last several days, she has remained hemodynamically stable, overall comfortable.  Medically stable for discharge to SNF whenever possible.TOC following   Assessment & Plan:   Principal Problem:   Sepsis ([place]) Active Problems:   Endotracheally intubated   On mechanically assisted ventilation (HCC)   Shunt malfunction   Rhabdomyolysis   AKI (acute kidney injury) ([place])   Increased anion gap metabolic acidosis   Acute encephalopathy   Transaminitis   Pressure injury of skin   Acute embolic stroke [facility])   Palliative care by specialist   DNR (do not resuscitate)   New UTI. Completed antibiotics.  Severe sepsis/Proteus/staph epidermidis  septicemia: This is secondary to wounds on sacrum.  Condition had resolved.   Depression. Continue citalopram  Postop acute hypoxemic respiratory failure.   VP shunt malfunction. 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 History of stroke. Improved.   B12 deficiency. Continue oral supplement.  Dysphagia. Continue dysphagia diet.    DVT prophylaxis: SCDs Code Status: DNR Family Communication: Disposition Plan:      Status is: Inpatient   Remains inpatient appropriate because: Unsafe discharge plan,           I/O last 3 completed shifts: In: 48 [P.O.:820; I.V.:10] Out: 700 [Urine:700] No intake/output data recorded.     Subjective: Patient had a better mood today, states that this her appetite is still poor, but she is eating.  No nausea vomiting abdominal pain. No short of breath or cough. Nausea fever chills.  Objective: Vitals:   06/28/21 1956 06/29/21 0526 06/29/21 0800 06/29/21 1211  BP: (!) 146/78 (!) 146/84 (!) 153/87 136/73  Pulse: 86 85 85 89  Resp: 20 16 18 18   Temp: 98.7 F (37.1 C) 98.6 F (37 C) 98.2 F (36.8 C) 97.7 F (36.5 C)  TempSrc: Oral Oral Oral Oral  SpO2: 96% 95% 98% 97%  Weight:      Height:        Intake/Output Summary (Last 24 hours) at 06/29/2021 1249 Last data filed at 06/28/2021 1820 Gross per 24 hour  Intake 480 ml  Output --  Net 480 ml   Filed Weights   06/25/21 0500 06/27/21 0500 06/28/21 0324  Weight: 104.5 kg 101.5 kg 98 kg    Examination:  General exam: Appears calm and comfortable  Respiratory system: Clear to auscultation. Respiratory effort normal. Cardiovascular system: S1 & S2 heard, RRR. No JVD, murmurs, rubs, gallops or clicks. No pedal edema. Gastrointestinal system: Abdomen is nondistended, soft and nontender. No organomegaly or masses felt. Normal bowel sounds heard. Central nervous system: Alert and oriented x2. No focal neurological deficits. Extremities: Symmetric 5  x 5 power. Skin: No rashes, lesions or ulcers Psychiatry:  Mood & affect appropriate.     Data Reviewed: I have personally reviewed following labs and imaging studies  CBC: No results for input(s): WBC, NEUTROABS, HGB, HCT, MCV, PLT in the last 168 hours. Basic Metabolic Panel: No results for input(s): NA, K, CL, CO2, GLUCOSE, BUN, CREATININE, CALCIUM, MG, PHOS in the last 168 hours. GFR: Estimated Creatinine Clearance: 75 mL/min (by C-G formula based on SCr of 0.84 mg/dL). Liver Function Tests: No results for input(s): AST, ALT, ALKPHOS, BILITOT, PROT, ALBUMIN in the last 168 hours. No results for input(s): LIPASE, AMYLASE in the last 168 hours. No results for input(s): AMMONIA in the last 168 hours. Coagulation Profile: No results for input(s): INR, PROTIME in the last 168 hours. Cardiac Enzymes: No results for input(s): CKTOTAL, CKMB, CKMBINDEX, TROPONINI in the last 168 hours. BNP (last 3 results) No results for input(s): PROBNP in the last 8760 hours. HbA1C: No results for input(s): HGBA1C in the last 72 hours. CBG: No results for input(s): GLUCAP in the last 168 hours. Lipid Profile: No results for input(s): CHOL, HDL, LDLCALC, TRIG, CHOLHDL, LDLDIRECT in the last 72 hours. Thyroid Function Tests: No results for input(s): TSH, T4TOTAL, FREET4, T3FREE, THYROIDAB in the last 72 hours. Anemia Panel: No results for input(s): VITAMINB12, FOLATE, FERRITIN, TIBC, IRON, RETICCTPCT in the last 72 hours. Sepsis Labs: No results for input(s): PROCALCITON, LATICACIDVEN in the last 168 hours.  Recent Results (from the past 240 hour(s))  Aerobic/Anaerobic Culture w Gram Stain (surgical/deep wound)     Status: Abnormal   Collection Time: 06/20/21  3:58 PM   Specimen: Back; Wound  Result Value Ref Range Status   Specimen Description   Final    BACK Performed at [facility], [address] [place], [place] [address]    Special Requests   Final    NONE Performed at  [facility], [address] [place], [place] [address]    Gram Stain   Final    FEW WBC PRESENT,BOTH PMN AND MONONUCLEAR RARE GRAM NEGATIVE RODS RARE GRAM VARIABLE COCCI    Culture (A)  Final    MULTIPLE ORGANISMS PRESENT, NONE PREDOMINANT NO STAPHYLOCOCCUS AUREUS ISOLATED NO GROUP A STREP (S.PYOGENES) ISOLATED NO ANAEROBES ISOLATED Performed at [facility], [place] [address], [place], [place] [address]    Report Status 06/25/2021 FINAL  Final  Urine Culture     Status: Abnormal   Collection Time: 06/25/21  2:04 PM   Specimen: Urine, Catheterized  Result Value Ref Range Status   Specimen Description   Final    URINE, CATHETERIZED Performed at [facility], [address] [place], [place] [address]    Special Requests   Final    NONE Performed at [facility], [address] [place], [place] [address]    Culture (A)  Final    >=100,000 COLONIES/mL ESCHERICHIA COLI 20,000 COLONIES/mL PSEUDOMONAS AERUGINOSA    Report Status 06/28/2021 FINAL  Final   Organism ID, Bacteria ESCHERICHIA COLI (A)  Final   Organism ID, Bacteria PSEUDOMONAS AERUGINOSA (A)  Final  Susceptibility   Escherichia coli - MIC*    AMPICILLIN >=32 RESISTANT Resistant     CEFAZOLIN <=4 SENSITIVE Sensitive     CEFEPIME <=0.12 SENSITIVE Sensitive     CEFTRIAXONE <=0.25 SENSITIVE Sensitive     CIPROFLOXACIN <=0.25 SENSITIVE Sensitive     GENTAMICIN <=1 SENSITIVE Sensitive     IMIPENEM <=0.25 SENSITIVE Sensitive     NITROFURANTOIN <=16 SENSITIVE Sensitive     TRIMETH/SULFA <=20 SENSITIVE Sensitive     AMPICILLIN/SULBACTAM >=32 RESISTANT Resistant     PIP/TAZO 64 INTERMEDIATE Intermediate     * >=100,000 COLONIES/mL ESCHERICHIA COLI   Pseudomonas aeruginosa - MIC*    CEFTAZIDIME >=64 RESISTANT Resistant     CIPROFLOXACIN <=0.25 SENSITIVE Sensitive     GENTAMICIN <=1 SENSITIVE Sensitive     IMIPENEM 2 SENSITIVE Sensitive     CEFEPIME >=32 RESISTANT Resistant     *  20,000 COLONIES/mL PSEUDOMONAS AERUGINOSA         Radiology Studies: No results found.      Scheduled Meds:  amLODipine  10 mg Oral Daily   aspirin EC  81 mg Oral Daily   Chlorhexidine Gluconate Cloth  6 each Topical Daily   citalopram  10 mg Oral Daily   docusate sodium  100 mg Oral BID   feeding supplement (NEPRO CARB STEADY)  237 mL Oral TID BM   mouth rinse  15 mL Mouth Rinse BID   metoprolol tartrate  25 mg Oral BID   multivitamin with minerals  1 tablet Oral Daily   pantoprazole  40 mg Oral Daily   senna-docusate  1 tablet Oral BID   [START ON 06/30/2021] sodium hypochlorite   Irrigation Q0200   vitamin B-12  1,000 mcg Oral Daily   Continuous Infusions:   LOS: 38 days    Time spent: 22 minutes    Sharen Hones, MD [facility]   To contact the attending provider between 7A-7P or the covering provider during after hours 7P-7A, please log into the web site [email or web address] and access using universal [place] password for that web site. If you do not have the password, please call the hospital operator.  06/29/2021, 12:49 PM

## 2021-06-29 NOTE — Consult Note (Signed)
WOC discussed patient's wounds with bedside nurse. She changed this am at 0920.  Reports still having green drainage. I will re-order Dakin's solution for another week.    Re consult if needed, will not follow at this time. Thanks  Shaeleigh Graw [employer], RN,CWOCN, CNS, [place] [phone])

## 2021-06-29 NOTE — Progress Notes (Signed)
Occupational Therapy Treatment Patient Details Name: Desiree Mitchell MRN: [id number] DOB: 1956/05/26 Today's Date: 06/29/2021   History of present illness Pt is a 65 y.o. female presenting to hospital 05/21/21 with AMS (found unresponsive by family).  Significant pressure sore present back of neck, buttocks, and coccyx.  Pt noted to be infested with bed bugs and roaches.  Pt admitted with acute respiratory failure, rhabdomyolysis, metabolic acidosis, acute kidney injury, sepsis, VA shunt malfunction, transaminitis, and posterior neck/buttocks/coccyx pressure injuries.  S/p removal ventriculoatrial shunt and placement of L parietal approach ventriculostomy 05/22/21; extubated 10/11; MRI brain 10/14 showing few scattered small acute infarcts involving different vascular territories (watershed type infarcts); TEE 10/18; and EVD removed 10/19.    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   OT comments  Pt agreeable to co-treatment with PT with maximal encouragement this session for therapeutic intervention. Pt continues to need mod-max A for bed mobility to EOB. Pt's sitting balance ranges from min - max A on EOB while eating a few bites of magic cup and ~10 minutes of static sitting. Pt standing x 3 reps with max A of 2 for upright posture. Pt standing for less than 30 seconds each time. Pt giving forth more effort but really needing increased motivation throughout session. Pt then requesting to return to bed and reports need for BM. Pt rolls with max A onto bed pan. She only has a smear after attempt on BM and rolls with +2 assistance for hygiene and to change chux pad. Pt repositioned in bed for comfort.   Recommendations for follow up therapy are one component of a multi-disciplinary discharge planning process, led by the attending physician.  Recommendations may be updated based on patient status, additional functional criteria and  insurance authorization.    Follow Up Recommendations  Skilled nursing-short term rehab (<3 hours/day)    Assistance Recommended at Discharge Frequent or constant Supervision/Assistance  Equipment Recommendations  Other (comment) (defer to next venue of care)       Precautions / Restrictions Precautions Precautions: Fall Precaution Comments: Aspiration; gastric tube; R chest port;  buttocks pressure injuries       Mobility Bed Mobility Overal bed mobility: Needs Assistance Bed Mobility: Supine to Sit;Sit to Supine;Rolling Rolling: Max assist   Supine to sit: +2 for physical assistance;Mod assist Sit to supine: Max assist;+2 for physical assistance        Transfers Overall transfer level: Needs assistance Equipment used: 2 person hand held assist Transfers: Sit to/from Stand Sit to Stand: Max assist;+2 physical assistance          Lateral/Scoot Transfers: Max assist;From elevated surface General transfer comment: Sit <>stand x 3 reps with able to give forth some effort but needing maximal encouragement and cuing     Balance Overall balance assessment: Needs assistance Sitting-balance support: Bilateral upper extremity supported;Feet supported Sitting balance-Leahy Scale: Poor     Standing balance support: Bilateral upper extremity supported Standing balance-Leahy Scale: Zero                             ADL either performed or assessed with clinical judgement   ADL Overall ADL's : Needs assistance/impaired Eating/Feeding: Set up;Cueing for safety Eating/Feeding Details (indicate cue type and reason): min - mod A for sitting balance on EOB while eating  Extremity/Trunk Assessment Upper Extremity Assessment Upper Extremity Assessment: Generalized weakness            Vision Patient Visual Report: No change from baseline            Cognition Arousal/Alertness: Awake/alert Behavior  During Therapy: WFL for tasks assessed/performed Overall Cognitive Status: No family/caregiver present to determine baseline cognitive functioning                                 General Comments: Increased time for task processing and maximal encouragemnt for motivation                     Pertinent Vitals/ Pain       Pain Assessment: No/denies pain         Frequency  Min 2X/week        Progress Toward Goals  OT Goals(current goals can now be found in the care plan section)  Progress towards OT goals: Progressing toward goals  Acute Rehab OT Goals Patient Stated Goal: "to get better" OT Goal Formulation: With patient Time For Goal Achievement: 07/06/21 Potential to Achieve Goals: [place] Discharge plan remains appropriate;Frequency remains appropriate    Co-evaluation    PT/OT/SLP Co-Evaluation/Treatment: Yes Reason for Co-Treatment: Complexity of the patient's impairments (multi-system involvement);Necessary to address cognition/behavior during functional activity;For patient/therapist safety;To address functional/ADL transfers PT goals addressed during session: Mobility/safety with mobility;Balance OT goals addressed during session: ADL's and self-care      AM-PAC OT "6 Clicks" Daily Activity     Outcome Measure   Help from another person eating meals?: A Little Help from another person taking care of personal grooming?: A Little Help from another person toileting, which includes using toliet, bedpan, or urinal?: Total Help from another person bathing (including washing, rinsing, drying)?: A Lot Help from another person to put on and taking off regular upper body clothing?: A Lot Help from another person to put on and taking off regular lower body clothing?: Total 6 Click Score: 12    End of Session    OT Visit Diagnosis: Muscle weakness (generalized) (M62.81);Adult, failure to thrive (R62.7);Other symptoms and signs involving  cognitive function   Activity Tolerance Patient limited by fatigue   Patient Left in bed;with call bell/phone within reach;with bed alarm set   Nurse Communication Mobility status        Time: 2355-7322 OT Time Calculation (min): 39 min  Charges: OT General Charges $OT Visit: 1 Visit OT Treatments $Self Care/Home Management : 8-22 mins  Darleen Crocker, MS, OTR/L , CBIS ascom [phone]  06/29/21, 4:27 PM

## 2021-06-30 DIAGNOSIS — A419 Sepsis, unspecified organism: Secondary | ICD-10-CM | POA: Diagnosis not present

## 2021-06-30 DIAGNOSIS — G9340 Encephalopathy, unspecified: Secondary | ICD-10-CM | POA: Diagnosis not present

## 2021-06-30 DIAGNOSIS — R6520 Severe sepsis without septic shock: Secondary | ICD-10-CM | POA: Diagnosis not present

## 2021-06-30 DIAGNOSIS — J9601 Acute respiratory failure with hypoxia: Secondary | ICD-10-CM | POA: Diagnosis not present

## 2021-06-30 MED ORDER — HEPARIN SOD (PORK) LOCK FLUSH 100 UNIT/ML IV SOLN
500.0000 [IU] | Freq: Once | INTRAVENOUS | Status: AC
  Administered 2021-06-30: 14:00:00 500 [IU] via INTRAVENOUS
  Filled 2021-06-30: qty 5

## 2021-06-30 NOTE — TOC Progression Note (Signed)
Transition of Care [facility]) - Progression Note    Patient Details  Name: Desiree Mitchell MRN: [id number] Date of Birth: [birth year]  Transition of Care [facility]) CM/SW [place], RN Phone Number: 06/30/2021, 8:14 AM  Clinical Narrative: Damaris Schooner with Billie Lade Admissions Coordinator, she is home ill will be able to address admission on Monday, 07/02/21.     Expected Discharge Plan: Skilled Nursing Facility Barriers to Discharge: Insurance Authorization  Expected Discharge Plan and Services Expected Discharge Plan: [place] In-house Referral: Clinical Social Work Discharge Planning Services: CM Consult Post Acute Care Choice: [place] Living arrangements for the past 2 months: Single Family Home                 DME Arranged: N/A DME Agency: NA       HH Arranged: NA HH Agency: NA         Social Determinants of Health (SDOH) Interventions    Readmission Risk Interventions No flowsheet data found.

## 2021-06-30 NOTE — Progress Notes (Signed)
PROGRESS NOTE    Desiree Mitchell  LDJ:[id number] DOB: 22-Mar-1956 DOA: 05/21/2021 PCP: Pcp, No    Brief Narrative:  Patient is a 65 year old female with history of breast cancer status post bilateral mastectomy, history of VA shunt  and prior subarachnoid hemorrhage/hydrocephalus, CVA with residual right-sided deficit, asthma, hypertension who presented at emergency department after she was found unresponsive by family.  On presentation, she was hypoxic, hypotensive.  She was also found to be infested with bedbugs and cockroaches.  CT head without contrast showed development of mild hydrocephalus, periventricular edema associated with malpositioned left VA shunt.  She was seen by neurosurgery and was urgently  taken to the OR for removal of her VA shunt and placement of external ventricular drain.  Hospital course also remarkable for bacteremia for which ID was following.  Patient was transferred to [facility] on 05/30/2021.  Several discussions were held with family about goals of care.  Initially she was on comfort care but since her mental status slowly improved, family wanted to discontinue comfort care,then family decided on placing her in a skilled nursing facility.   Hospice agency was also following and recommending hospice support on long-term care.  Since last several days, she has remained hemodynamically stable, overall comfortable.  Medically stable for discharge to SNF whenever possible.TOC following   Assessment & Plan:   Principal Problem:   Sepsis ([place]) Active Problems:   Endotracheally intubated   On mechanically assisted ventilation (HCC)   Shunt malfunction   Rhabdomyolysis   AKI (acute kidney injury) ([place])   Increased anion gap metabolic acidosis   Acute encephalopathy   Transaminitis   Pressure injury of skin   Acute embolic stroke [facility])   Palliative care by specialist   DNR (do not resuscitate)   New UTI. Severe sepsis/Proteus/staph epidermidis septicemia: This is  secondary to wounds on sacrum.  Patient has completed antibiotics.  Depression. Continue citalopram  Postop acute hypoxemic respiratory failure.   VP shunt malfunction. 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 History of stroke. No new issues.   B12 deficiency. Continue oral supplement.  Dysphagia. Continue dysphagia diet.     DVT prophylaxis: SCDs Code Status: DNR Family Communication: Disposition Plan:      Status is: Inpatient   Remains inpatient appropriate because: Unsafe discharge plan,            I/O last 3 completed shifts: In: -  Out: 1920 [Urine:1920] Total I/O In: 20 [P.O.:20] Out: -     Subjective: Patient doing well, she is normally bedbound. Appetite is improving without nausea vomiting.  Bowel movement yesterday. Denies any short of breath or cough. No dysuria hematuria.  Objective: Vitals:   06/30/21 0500 06/30/21 0541 06/30/21 0809 06/30/21 1152  BP:  (!) 149/67 (!) 161/81 (!) 148/73  Pulse:  83 84 78  Resp:  18 18 17   Temp:  98.6 F (37 C) 98.5 F (36.9 C) 98.2 F (36.8 C)  TempSrc:      SpO2:  94% 95% 93%  Weight: 99.5 kg     Height:        Intake/Output Summary (Last 24 hours) at 06/30/2021 1303 Last data filed at 06/30/2021 1111 Gross per 24 hour  Intake 20 ml  Output 200 ml  Net -180 ml   Filed Weights   06/27/21 0500 06/28/21 0324 06/30/21 0500  Weight: 101.5 kg 98 kg 99.5 kg    Examination:  General exam: Appears calm and comfortable  Respiratory system: Clear to  auscultation. Respiratory effort normal. Cardiovascular system: S1 & S2 heard, RRR. No JVD, murmurs, rubs, gallops or clicks. No pedal edema. Gastrointestinal system: Abdomen is nondistended, soft and nontender. No organomegaly or masses felt. Normal bowel sounds heard. Central nervous system: Alert and oriented x2. No focal neurological deficits. Extremities: Symmetric 5 x 5 power. Skin: No rashes, lesions or  ulcers Psychiatry: Judgement and insight appear normal. Mood & affect appropriate.     Data Reviewed: I have personally reviewed following labs and imaging studies  CBC: No results for input(s): WBC, NEUTROABS, HGB, HCT, MCV, PLT in the last 168 hours. Basic Metabolic Panel: No results for input(s): NA, K, CL, CO2, GLUCOSE, BUN, CREATININE, CALCIUM, MG, PHOS in the last 168 hours. GFR: Estimated Creatinine Clearance: 75.6 mL/min (by C-G formula based on SCr of 0.84 mg/dL). Liver Function Tests: No results for input(s): AST, ALT, ALKPHOS, BILITOT, PROT, ALBUMIN in the last 168 hours. No results for input(s): LIPASE, AMYLASE in the last 168 hours. No results for input(s): AMMONIA in the last 168 hours. Coagulation Profile: No results for input(s): INR, PROTIME in the last 168 hours. Cardiac Enzymes: No results for input(s): CKTOTAL, CKMB, CKMBINDEX, TROPONINI in the last 168 hours. BNP (last 3 results) No results for input(s): PROBNP in the last 8760 hours. HbA1C: No results for input(s): HGBA1C in the last 72 hours. CBG: No results for input(s): GLUCAP in the last 168 hours. Lipid Profile: No results for input(s): CHOL, HDL, LDLCALC, TRIG, CHOLHDL, LDLDIRECT in the last 72 hours. Thyroid Function Tests: No results for input(s): TSH, T4TOTAL, FREET4, T3FREE, THYROIDAB in the last 72 hours. Anemia Panel: No results for input(s): VITAMINB12, FOLATE, FERRITIN, TIBC, IRON, RETICCTPCT in the last 72 hours. Sepsis Labs: No results for input(s): PROCALCITON, LATICACIDVEN in the last 168 hours.  Recent Results (from the past 240 hour(s))  Aerobic/Anaerobic Culture w Gram Stain (surgical/deep wound)     Status: Abnormal   Collection Time: 06/20/21  3:58 PM   Specimen: Back; Wound  Result Value Ref Range Status   Specimen Description   Final    BACK Performed at [facility], [address] [place], [place] [address]    Special Requests   Final    NONE Performed at  [facility], [address] [place], [place] [address]    Gram Stain   Final    FEW WBC PRESENT,BOTH PMN AND MONONUCLEAR RARE GRAM NEGATIVE RODS RARE GRAM VARIABLE COCCI    Culture (A)  Final    MULTIPLE ORGANISMS PRESENT, NONE PREDOMINANT NO STAPHYLOCOCCUS AUREUS ISOLATED NO GROUP A STREP (S.PYOGENES) ISOLATED NO ANAEROBES ISOLATED Performed at [facility], [place] [address], [place], [place] [address]    Report Status 06/25/2021 FINAL  Final  Urine Culture     Status: Abnormal   Collection Time: 06/25/21  2:04 PM   Specimen: Urine, Catheterized  Result Value Ref Range Status   Specimen Description   Final    URINE, CATHETERIZED Performed at [facility], [address] [place], [place] [address]    Special Requests   Final    NONE Performed at [facility], [address] [place], [place] [address]    Culture (A)  Final    >=100,000 COLONIES/mL ESCHERICHIA COLI 20,000 COLONIES/mL PSEUDOMONAS AERUGINOSA    Report Status 06/28/2021 FINAL  Final   Organism ID, Bacteria ESCHERICHIA COLI (A)  Final   Organism ID, Bacteria PSEUDOMONAS AERUGINOSA (A)  Final  Susceptibility   Escherichia coli - MIC*    AMPICILLIN >=32 RESISTANT Resistant     CEFAZOLIN <=4 SENSITIVE Sensitive     CEFEPIME <=0.12 SENSITIVE Sensitive     CEFTRIAXONE <=0.25 SENSITIVE Sensitive     CIPROFLOXACIN <=0.25 SENSITIVE Sensitive     GENTAMICIN <=1 SENSITIVE Sensitive     IMIPENEM <=0.25 SENSITIVE Sensitive     NITROFURANTOIN <=16 SENSITIVE Sensitive     TRIMETH/SULFA <=20 SENSITIVE Sensitive     AMPICILLIN/SULBACTAM >=32 RESISTANT Resistant     PIP/TAZO 64 INTERMEDIATE Intermediate     * >=100,000 COLONIES/mL ESCHERICHIA COLI   Pseudomonas aeruginosa - MIC*    CEFTAZIDIME >=64 RESISTANT Resistant     CIPROFLOXACIN <=0.25 SENSITIVE Sensitive     GENTAMICIN <=1 SENSITIVE Sensitive     IMIPENEM 2 SENSITIVE Sensitive     CEFEPIME >=32 RESISTANT Resistant     *  20,000 COLONIES/mL PSEUDOMONAS AERUGINOSA         Radiology Studies: No results found.      Scheduled Meds:  amLODipine  10 mg Oral Daily   aspirin EC  81 mg Oral Daily   Chlorhexidine Gluconate Cloth  6 each Topical Daily   citalopram  10 mg Oral Daily   docusate sodium  100 mg Oral BID   feeding supplement (NEPRO CARB STEADY)  237 mL Oral TID BM   mouth rinse  15 mL Mouth Rinse BID   metoprolol tartrate  25 mg Oral BID   multivitamin with minerals  1 tablet Oral Daily   pantoprazole  40 mg Oral Daily   senna-docusate  1 tablet Oral BID   sodium hypochlorite   Irrigation Q0200   vitamin B-12  1,000 mcg Oral Daily   Continuous Infusions:   LOS: 39 days    Time spent: 21 minutes    Sharen Hones, MD [facility]   To contact the attending provider between 7A-7P or the covering provider during after hours 7P-7A, please log into the web site [email or web address] and access using universal [place] password for that web site. If you do not have the password, please call the hospital operator.  06/30/2021, 1:03 PM

## 2021-07-01 DIAGNOSIS — G9340 Encephalopathy, unspecified: Secondary | ICD-10-CM | POA: Diagnosis not present

## 2021-07-01 DIAGNOSIS — A419 Sepsis, unspecified organism: Secondary | ICD-10-CM | POA: Diagnosis not present

## 2021-07-01 DIAGNOSIS — R6520 Severe sepsis without septic shock: Secondary | ICD-10-CM | POA: Diagnosis not present

## 2021-07-01 DIAGNOSIS — J9601 Acute respiratory failure with hypoxia: Secondary | ICD-10-CM | POA: Diagnosis not present

## 2021-07-01 NOTE — Progress Notes (Signed)
PROGRESS NOTE    Desiree Mitchell  KXF:[id number] DOB: 12-02-1955 DOA: 05/21/2021 PCP: Pcp, No    Brief Narrative:  Patient is a 65 year old female with history of breast cancer status post bilateral mastectomy, history of VA shunt  and prior subarachnoid hemorrhage/hydrocephalus, CVA with residual right-sided deficit, asthma, hypertension who presented at emergency department after she was found unresponsive by family.  On presentation, she was hypoxic, hypotensive.  She was also found to be infested with bedbugs and cockroaches.  CT head without contrast showed development of mild hydrocephalus, periventricular edema associated with malpositioned left VA shunt.  She was seen by neurosurgery and was urgently  taken to the OR for removal of her VA shunt and placement of external ventricular drain.  Hospital course also remarkable for bacteremia for which ID was following.  Patient was transferred to [facility] on 05/30/2021.  Several discussions were held with family about goals of care.  Initially she was on comfort care but since her mental status slowly improved, family wanted to discontinue comfort care,then family decided on placing her in a skilled nursing facility.   Hospice agency was also following and recommending hospice support on long-term care.  Since last several days, she has remained hemodynamically stable, overall comfortable.  Medically stable for discharge to SNF whenever possible.TOC following   Assessment & Plan:   Principal Problem:   Sepsis ([place]) Active Problems:   Endotracheally intubated   On mechanically assisted ventilation (HCC)   Shunt malfunction   Rhabdomyolysis   AKI (acute kidney injury) ([place])   Increased anion gap metabolic acidosis   Acute encephalopathy   Transaminitis   Pressure injury of skin   Acute embolic stroke [facility])   Palliative care by specialist   DNR (do not resuscitate)  New UTI. Severe sepsis/Proteus/staph epidermidis septicemia: This is  secondary to wounds on sacrum.  Patient has completed antibiotics.  No recurrent symptoms.  Depression. On citalopram.  Postop acute hypoxemic respiratory failure.   VP shunt malfunction. 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 History of stroke. Continue to follow, off oxygen.   B12 deficiency. Continue oral supplement.  Dysphagia. Continue dysphagia diet.      DVT prophylaxis: SCDs Code Status: DNR Family Communication: Disposition Plan:      Status is: Inpatient   Remains inpatient appropriate because: Unsafe discharge plan,        I/O last 3 completed shifts: In: 31 [P.O.:90] Out: 500 [Urine:500] No intake/output data recorded.    Subjective: Patient doing well, no new issues.  She has decent appetite, no nausea vomiting. No short of breath or cough. No fever or chills.  Objective: Vitals:   07/01/21 0421 07/01/21 0442 07/01/21 0832 07/01/21 1216  BP: (!) 152/89  (!) 146/77 127/78  Pulse: 81  79 80  Resp: 16  16 18   Temp: 98.9 F (37.2 C)  98.4 F (36.9 C) 99 F (37.2 C)  TempSrc:      SpO2: 93%  96% 93%  Weight:  102.7 kg    Height:        Intake/Output Summary (Last 24 hours) at 07/01/2021 1235 Last data filed at 06/30/2021 2040 Gross per 24 hour  Intake 70 ml  Output 300 ml  Net -230 ml   Filed Weights   06/28/21 0324 06/30/21 0500 07/01/21 0442  Weight: 98 kg 99.5 kg 102.7 kg    Examination:  General exam: Appears calm and comfortable  Respiratory system: Clear to auscultation. Respiratory effort normal. Cardiovascular system:  S1 & S2 heard, RRR. No JVD, murmurs, rubs, gallops or clicks. No pedal edema. Gastrointestinal system: Abdomen is nondistended, soft and nontender. No organomegaly or masses felt. Normal bowel sounds heard. Central nervous system: Alert and oriented x2. No focal neurological deficits. Extremities: Symmetric 5 x 5 power. Skin: No rashes, lesions or ulcers Psychiatry: Mood & affect  appropriate.     Data Reviewed: I have personally reviewed following labs and imaging studies  CBC: No results for input(s): WBC, NEUTROABS, HGB, HCT, MCV, PLT in the last 168 hours. Basic Metabolic Panel: No results for input(s): NA, K, CL, CO2, GLUCOSE, BUN, CREATININE, CALCIUM, MG, PHOS in the last 168 hours. GFR: Estimated Creatinine Clearance: 76.9 mL/min (by C-G formula based on SCr of 0.84 mg/dL). Liver Function Tests: No results for input(s): AST, ALT, ALKPHOS, BILITOT, PROT, ALBUMIN in the last 168 hours. No results for input(s): LIPASE, AMYLASE in the last 168 hours. No results for input(s): AMMONIA in the last 168 hours. Coagulation Profile: No results for input(s): INR, PROTIME in the last 168 hours. Cardiac Enzymes: No results for input(s): CKTOTAL, CKMB, CKMBINDEX, TROPONINI in the last 168 hours. BNP (last 3 results) No results for input(s): PROBNP in the last 8760 hours. HbA1C: No results for input(s): HGBA1C in the last 72 hours. CBG: No results for input(s): GLUCAP in the last 168 hours. Lipid Profile: No results for input(s): CHOL, HDL, LDLCALC, TRIG, CHOLHDL, LDLDIRECT in the last 72 hours. Thyroid Function Tests: No results for input(s): TSH, T4TOTAL, FREET4, T3FREE, THYROIDAB in the last 72 hours. Anemia Panel: No results for input(s): VITAMINB12, FOLATE, FERRITIN, TIBC, IRON, RETICCTPCT in the last 72 hours. Sepsis Labs: No results for input(s): PROCALCITON, LATICACIDVEN in the last 168 hours.  Recent Results (from the past 240 hour(s))  Urine Culture     Status: Abnormal   Collection Time: 06/25/21  2:04 PM   Specimen: Urine, Catheterized  Result Value Ref Range Status   Specimen Description   Final    URINE, CATHETERIZED Performed at [facility], [address], [place], [place] [address]    Special Requests   Final    NONE Performed at [facility], [place], [place] [address]    Culture (A)  Final     >=100,000 COLONIES/mL ESCHERICHIA COLI 20,000 COLONIES/mL PSEUDOMONAS AERUGINOSA    Report Status 06/28/2021 FINAL  Final   Organism ID, Bacteria ESCHERICHIA COLI (A)  Final   Organism ID, Bacteria PSEUDOMONAS AERUGINOSA (A)  Final      Susceptibility   Escherichia coli - MIC*    AMPICILLIN >=32 RESISTANT Resistant     CEFAZOLIN <=4 SENSITIVE Sensitive     CEFEPIME <=0.12 SENSITIVE Sensitive     CEFTRIAXONE <=0.25 SENSITIVE Sensitive     CIPROFLOXACIN <=0.25 SENSITIVE Sensitive     GENTAMICIN <=1 SENSITIVE Sensitive     IMIPENEM <=0.25 SENSITIVE Sensitive     NITROFURANTOIN <=16 SENSITIVE Sensitive     TRIMETH/SULFA <=20 SENSITIVE Sensitive     AMPICILLIN/SULBACTAM >=32 RESISTANT Resistant     PIP/TAZO 64 INTERMEDIATE Intermediate     * >=100,000 COLONIES/mL ESCHERICHIA COLI   Pseudomonas aeruginosa - MIC*    CEFTAZIDIME >=64 RESISTANT Resistant     CIPROFLOXACIN <=0.25 SENSITIVE Sensitive     GENTAMICIN <=1 SENSITIVE Sensitive     IMIPENEM 2 SENSITIVE Sensitive     CEFEPIME >=32 RESISTANT Resistant     * 20,000 COLONIES/mL PSEUDOMONAS AERUGINOSA         Radiology  Studies: No results found.      Scheduled Meds:  amLODipine  10 mg Oral Daily   aspirin EC  81 mg Oral Daily   citalopram  10 mg Oral Daily   docusate sodium  100 mg Oral BID   feeding supplement (NEPRO CARB STEADY)  237 mL Oral TID BM   mouth rinse  15 mL Mouth Rinse BID   metoprolol tartrate  25 mg Oral BID   multivitamin with minerals  1 tablet Oral Daily   pantoprazole  40 mg Oral Daily   senna-docusate  1 tablet Oral BID   sodium hypochlorite   Irrigation Q0200   vitamin B-12  1,000 mcg Oral Daily   Continuous Infusions:   LOS: 40 days    Time spent: 16 minutes    Sharen Hones, MD [facility]   To contact the attending provider between 7A-7P or the covering provider during after hours 7P-7A, please log into the web site [email or web address] and access using universal [place]  password for that web site. If you do not have the password, please call the hospital operator.  07/01/2021, 12:35 PM

## 2021-07-02 DIAGNOSIS — A419 Sepsis, unspecified organism: Secondary | ICD-10-CM | POA: Diagnosis not present

## 2021-07-02 DIAGNOSIS — R6520 Severe sepsis without septic shock: Secondary | ICD-10-CM | POA: Diagnosis not present

## 2021-07-02 DIAGNOSIS — G9340 Encephalopathy, unspecified: Secondary | ICD-10-CM | POA: Diagnosis not present

## 2021-07-02 DIAGNOSIS — J9601 Acute respiratory failure with hypoxia: Secondary | ICD-10-CM | POA: Diagnosis not present

## 2021-07-02 NOTE — Progress Notes (Signed)
Physical Therapy Treatment Patient Details Name: Desiree Mitchell MRN: [id number] DOB: [birth year] Today's Date: 07/02/2021   History of Present Illness Pt is a 65 y.o. female presenting to hospital 05/21/21 with AMS (found unresponsive by family).  Significant pressure sore present back of neck, buttocks, and coccyx.  Pt noted to be infested with bed bugs and roaches.  Pt admitted with acute respiratory failure, rhabdomyolysis, metabolic acidosis, acute kidney injury, sepsis, VA shunt malfunction, transaminitis, and posterior neck/buttocks/coccyx pressure injuries.  S/p removal ventriculoatrial shunt and placement of L parietal approach ventriculostomy 05/22/21; extubated 10/11; MRI brain 10/14 showing few scattered small acute infarcts involving different vascular territories (watershed type infarcts); TEE 10/18; and EVD removed 10/19.    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    PT Comments    Patient seen by PT/OT for a co-treat to maximize pt function, participation and motivation. RN present to motivate patient as well. Session focused on attempting standing; modAx2 to sit and reposition EOB (airbed difficult to navigate). Pt did return to supine spontaneously citing fatigue with CGA 1-2 times during session, but ultimately modAx2 to reposition in supine after standing attempts. Sit <> stand maxAx2 with handheld assist, pt unable to come up into upright positioning. Patient expressed generalized feeling of "giving up" and reports she "doesn't feel like I have it in me" (related to therapy/recovery). Question benefit of psychological consult to assess down/depressed affect and impact on therapy participation/progress.  However, after returning to supine, she did continue to participate with PT for several exercises, AROM/AAROM.  Current plan remains appropriate, PT to continue to assess pt's ability/willingness to participate  with therapy services.     Recommendations for follow up therapy are one component of a multi-disciplinary discharge planning process, led by the attending physician.  Recommendations may be updated based on patient status, additional functional criteria and insurance authorization.  Follow Up Recommendations  Skilled nursing-short term rehab (<3 hours/day)     Assistance Recommended at Discharge Frequent or constant Supervision/Assistance  Equipment Recommendations  None recommended by PT    Recommendations for Other Services OT consult     Precautions / Restrictions Precautions Precautions: Fall Precaution Comments: Aspiration; gastric tube; R chest port;  buttocks pressure injuries     Mobility  Bed Mobility Overal bed mobility: Needs Assistance Bed Mobility: Supine to Sit;Sit to Supine     Supine to sit: +2 for physical assistance;Mod assist Sit to supine: Min guard;Mod assist;+2 for physical assistance   General bed mobility comments: did spontaneously return to supine a few times during session with CGA; encouragement needed to maximize pt participation. minAx2 for return to supine fully at end of session    Transfers Overall transfer level: Needs assistance Equipment used: 2 person hand held assist (bilat knee block) Transfers: Sit to/from Stand Sit to Stand: Max assist;+2 physical assistance           General transfer comment: Sit <>stand x 2 reps with able to give forth some effort but needing maximal encouragement and cuing. Continues to be severely limited but author questions effort put forth    Ambulation/Gait               General Gait Details: unable/unsafe to attempt. pt extremely weak and unsafe to advance away form EOB   Stairs             Wheelchair Mobility    Modified Rankin (Stroke Patients Only)  Balance Overall balance assessment: Needs assistance Sitting-balance support: Bilateral upper extremity supported;Feet  supported Sitting balance-Leahy Scale: Poor Sitting balance - Comments: has several instances especially at beginning of session once repositioned at EOB to maintain balance with CGA. L posterior lean noted with fatigue and after standing attempts, minA through majority of session   Standing balance support: Bilateral upper extremity supported Standing balance-Leahy Scale: Zero Standing balance comment: +2 asist to even achieve standing. only stood ~ 5 sec                            Cognition Arousal/Alertness: Awake/alert Behavior During Therapy: WFL for tasks assessed/performed Overall Cognitive Status: No family/caregiver present to determine baseline cognitive functioning                                 General Comments: Increased time for task processing and maximal encouragemnt for motivation        Exercises General Exercises - Lower Extremity Ankle Circles/Pumps: AROM;Strengthening;Both;10 reps Quad Sets: AROM;Strengthening;Both;10 reps Short Arc Quad: AROM;Strengthening;Both;10 reps Heel Slides: AAROM;Strengthening;Both;10 reps    General Comments General comments (skin integrity, edema, etc.): L arm/hand edema noted      Pertinent Vitals/Pain Pain Assessment: No/denies pain    Home Living                          Prior Function            PT Goals (current goals can now be found in the care plan section) Progress towards PT goals: Not progressing toward goals - comment (pt with varying levels of motivation to participate with session)    Frequency    Min 2X/week      PT Plan Current plan remains appropriate    Co-evaluation   Reason for Co-Treatment: Complexity of the patient's impairments (multi-system involvement);To address functional/ADL transfers;Necessary to address cognition/behavior during functional activity PT goals addressed during session: Mobility/safety with mobility;Balance OT goals addressed during  session: Strengthening/ROM;ADL's and self-care      AM-PAC PT "6 Clicks" Mobility   Outcome Measure  Help needed turning from your back to your side while in a flat bed without using bedrails?: A Lot Help needed moving from lying on your back to sitting on the side of a flat bed without using bedrails?: A Lot Help needed moving to and from a bed to a chair (including a wheelchair)?: Total Help needed standing up from a chair using your arms (e.g., wheelchair or bedside chair)?: Total Help needed to walk in hospital room?: Total Help needed climbing 3-5 steps with a railing? : Total 6 Click Score: 8    End of Session Equipment Utilized During Treatment: Gait belt Activity Tolerance: Patient limited by fatigue Patient left: in bed;with call bell/phone within reach Nurse Communication: Mobility status PT Visit Diagnosis: Other abnormalities of gait and mobility (R26.89);Muscle weakness (generalized) (M62.81);Difficulty in walking, not elsewhere classified (R26.2)     Time: 1334-1400 PT Time Calculation (min) (ACUTE ONLY): 26 min  Charges:  $Therapeutic Exercise: 8-22 mins                    Lieutenant Diego PT, DPT 2:22 PM,07/02/21

## 2021-07-02 NOTE — Progress Notes (Signed)
Occupational Therapy Treatment Patient Details Name: Desiree Mitchell MRN: [id number] DOB: Mar 02, 1956 Today's Date: 07/02/2021   History of present illness Pt is a 65 y.o. female presenting to hospital 05/21/21 with AMS (found unresponsive by family).  Significant pressure sore present back of neck, buttocks, and coccyx.  Pt noted to be infested with bed bugs and roaches.  Pt admitted with acute respiratory failure, rhabdomyolysis, metabolic acidosis, acute kidney injury, sepsis, VA shunt malfunction, transaminitis, and posterior neck/buttocks/coccyx pressure injuries.  S/p removal ventriculoatrial shunt and placement of L parietal approach ventriculostomy 05/22/21; extubated 10/11; MRI brain 10/14 showing few scattered small acute infarcts involving different vascular territories (watershed type infarcts); TEE 10/18; and EVD removed 10/19.    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   OT comments  Pt seen for skilled co-treatment with Pt this session. Focus continues to be on functional mobility, balance, strength, and self care. Pt did have lunch tray in front of her,set up, and it appeared that pt has been feeding self prior to therapist arrival. Pt with smile on her face and friend present in the room for encouragement. Pt required mod A of 2 to EOB. Static sitting balance ranges from min - mod A with pt having posterior and L lateral bias. Sit <>stand attempted twice with max A of 2 and pt upright for ~ 5 seconds before returning to bed. Pt encouraged to attempt and again and she refuses and reports, " No, I give up". Therapeutic use of self demonstrated and pt encouragement to continue but she then begins leaning herself back into bed. She also reports having BM with standing attempt but with mod- max A rolling in bed pt noted to not need hygiene assistance at this time. She refuses bed pan placement. Pt repositioned for comfort  in bed and RN notified of session. Pt is self limiting this session and not able to progress further during this session. OT will continue to attempt to see pt on trial bases if pt is able to progress further.    Recommendations for follow up therapy are one component of a multi-disciplinary discharge planning process, led by the attending physician.  Recommendations may be updated based on patient status, additional functional criteria and insurance authorization.    Follow Up Recommendations  Skilled nursing-short term rehab (<3 hours/day)    Assistance Recommended at Discharge Frequent or constant Supervision/Assistance  Equipment Recommendations  Other (comment) (defer to next venue of care)       Precautions / Restrictions Precautions Precautions: Fall Precaution Comments: Aspiration; gastric tube; R chest port;  buttocks pressure injuries       Mobility Bed Mobility Overal bed mobility: Needs Assistance Bed Mobility: Supine to Sit;Sit to Supine     Supine to sit: +2 for physical assistance;Mod assist Sit to supine: Mod assist;+2 for physical assistance;Min assist   General bed mobility comments: did spontaneously return to supine a few times during session with CGA; encouragement needed to maximize pt participation. minAx2 for return to supine fully at end of session    Transfers Overall transfer level: Needs assistance Equipment used: 2 person hand held assist Transfers: Sit to/from Stand Sit to Stand: Max assist;+2 physical assistance           General transfer comment: Sit <>stand x 2 reps with able to give forth some effort but needing maximal encouragement and cuing. Continues to be severely limited but Chief Strategy Officer questions effort  put forth     Balance Overall balance assessment: Needs assistance Sitting-balance support: Bilateral upper extremity supported;Feet supported Sitting balance-Leahy Scale: Poor Sitting balance - Comments: has several instances especially  at beginning of session once repositioned at EOB to maintain balance with CGA. L posterior lean noted with fatigue and after standing attempts, minA through majority of session Postural control: Posterior lean Standing balance support: Bilateral upper extremity supported Standing balance-Leahy Scale: Zero Standing balance comment: +2 asist to even achieve standing. only stood ~ 5 sec                           ADL either performed or assessed with clinical judgement      Cognition Arousal/Alertness: Awake/alert Behavior During Therapy: WFL for tasks assessed/performed Overall Cognitive Status: No family/caregiver present to determine baseline cognitive functioning                                 General Comments: Increased time for task processing and maximal encouragemnt for motivation          Exercises General Exercises - Lower Extremity Ankle Circles/Pumps: AROM;Strengthening;Both;10 reps Quad Sets: AROM;Strengthening;Both;10 reps Short Arc Quad: AROM;Strengthening;Both;10 reps Heel Slides: AAROM;Strengthening;Both;10 reps      General Comments L arm/hand edema noted    Pertinent Vitals/ Pain       Pain Assessment: No/denies pain         Frequency  Min 2X/week        Progress Toward Goals  OT Goals(current goals can now be found in the care plan section)  Progress towards OT goals: OT to reassess next treatment  Acute Rehab OT Goals Patient Stated Goal: none stated OT Goal Formulation: Patient unable to participate in goal setting Time For Goal Achievement: 07/06/21 Potential to Achieve Goals: [place] Discharge plan remains appropriate;Frequency remains appropriate    Co-evaluation    PT/OT/SLP Co-Evaluation/Treatment: Yes Reason for Co-Treatment: Complexity of the patient's impairments (multi-system involvement);For patient/therapist safety;To address functional/ADL transfers PT goals addressed during session:  Mobility/safety with mobility OT goals addressed during session: ADL's and self-care;Strengthening/ROM      AM-PAC OT "6 Clicks" Daily Activity     Outcome Measure   Help from another person eating meals?: A Little Help from another person taking care of personal grooming?: A Little Help from another person toileting, which includes using toliet, bedpan, or urinal?: Total Help from another person bathing (including washing, rinsing, drying)?: A Lot Help from another person to put on and taking off regular upper body clothing?: A Lot Help from another person to put on and taking off regular lower body clothing?: Total 6 Click Score: 12    End of Session    OT Visit Diagnosis: Muscle weakness (generalized) (M62.81);Adult, failure to thrive (R62.7);Other symptoms and signs involving cognitive function   Activity Tolerance Patient limited by fatigue;Other (comment) (self limiting)   Patient Left in bed;with call bell/phone within reach;with bed alarm set   Nurse Communication Mobility status        Time: 4650-3546 OT Time Calculation (min): 20 min  Charges: OT General Charges $OT Visit: 1 Visit OT Treatments $Therapeutic Activity: 8-22 mins  Darleen Crocker, MS, OTR/L , CBIS ascom [phone]  07/02/21, 3:37 PM

## 2021-07-02 NOTE — Progress Notes (Signed)
PROGRESS NOTE    Analy Mitchell  MAU:[id number] DOB: 1956-01-17 DOA: 05/21/2021 PCP: Pcp, No    Brief Narrative:   Patient is a 65 year old female with history of breast cancer status post bilateral mastectomy, history of VA shunt  and prior subarachnoid hemorrhage/hydrocephalus, CVA with residual right-sided deficit, asthma, hypertension who presented at emergency department after she was found unresponsive by family.  On presentation, she was hypoxic, hypotensive.  She was also found to be infested with bedbugs and cockroaches.  CT head without contrast showed development of mild hydrocephalus, periventricular edema associated with malpositioned left VA shunt.  She was seen by neurosurgery and was urgently  taken to the OR for removal of her VA shunt and placement of external ventricular drain.  Hospital course also remarkable for bacteremia for which ID was following.  Patient was transferred to [facility] on 05/30/2021.  Several discussions were held with family about goals of care.  Initially she was on comfort care but since her mental status slowly improved, family wanted to discontinue comfort care,then family decided on placing her in a skilled nursing facility.   Hospice agency was also following and recommending hospice support on long-term care.  Since last several days, she has remained hemodynamically stable, overall comfortable.  Medically stable for discharge to SNF whenever possible.TOC following  Assessment & Plan:   Principal Problem:   Sepsis ([place]) Active Problems:   Endotracheally intubated   On mechanically assisted ventilation (HCC)   Shunt malfunction   Rhabdomyolysis   AKI (acute kidney injury) ([place])   Increased anion gap metabolic acidosis   Acute encephalopathy   Transaminitis   Pressure injury of skin   Acute embolic stroke [facility])   Palliative care by specialist   DNR (do not resuscitate)   New UTI. Severe sepsis/Proteus/staph epidermidis septicemia: secondary to  wounds on sacrum.   Patient doing well, currently pending nursing home placement.   Depression. On citalopram.  Postop acute hypoxemic respiratory failure.   VP shunt malfunction. 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 History of stroke. Stable and improved.   B12 deficiency. Continue oral supplement.  Dysphagia. Continue dysphagia diet.    DVT prophylaxis: SCDs Code Status: DNR Family Communication: Disposition Plan:      Status is: Inpatient   Remains inpatient appropriate because: Unsafe discharge plan,        I/O last 3 completed shifts: In: 290 [P.O.:290] Out: 300 [Urine:300] Total I/O In: 120 [P.O.:120] Out: -     Subjective: No new issues, good appetite without nausea vomiting.  Had a bowel movement today. No short of breath or cough. No fever or chills.  Objective: Vitals:   07/02/21 0432 07/02/21 0500 07/02/21 0800 07/02/21 1224  BP: (!) 149/88  120/67 (!) 161/90  Pulse: 71  85 100  Resp: 18  18 17   Temp: 98.3 F (36.8 C)  98.7 F (37.1 C) 98.8 F (37.1 C)  TempSrc: Oral     SpO2: 95%  94% 94%  Weight:  101.5 kg    Height:        Intake/Output Summary (Last 24 hours) at 07/02/2021 1245 Last data filed at 07/02/2021 1000 Gross per 24 hour  Intake 360 ml  Output --  Net 360 ml   Filed Weights   06/30/21 0500 07/01/21 0442 07/02/21 0500  Weight: 99.5 kg 102.7 kg 101.5 kg    Examination:  General exam: Appears calm and comfortable  Respiratory system: Clear to auscultation. Respiratory effort normal. Cardiovascular system:  S1 & S2 heard, RRR. No JVD, murmurs, rubs, gallops or clicks. No pedal edema. Gastrointestinal system: Abdomen is nondistended, soft and nontender. No organomegaly or masses felt. Normal bowel sounds heard. Central nervous system: Alert and oriented x2. No focal neurological deficits. Extremities: Symmetric 5 x 5 power. Skin: No rashes, lesions or ulcers Psychiatry: Judgement and  insight appear normal. Mood & affect appropriate.     Data Reviewed: I have personally reviewed following labs and imaging studies  CBC: No results for input(s): WBC, NEUTROABS, HGB, HCT, MCV, PLT in the last 168 hours. Basic Metabolic Panel: No results for input(s): NA, K, CL, CO2, GLUCOSE, BUN, CREATININE, CALCIUM, MG, PHOS in the last 168 hours. GFR: Estimated Creatinine Clearance: 76.5 mL/min (by C-G formula based on SCr of 0.84 mg/dL). Liver Function Tests: No results for input(s): AST, ALT, ALKPHOS, BILITOT, PROT, ALBUMIN in the last 168 hours. No results for input(s): LIPASE, AMYLASE in the last 168 hours. No results for input(s): AMMONIA in the last 168 hours. Coagulation Profile: No results for input(s): INR, PROTIME in the last 168 hours. Cardiac Enzymes: No results for input(s): CKTOTAL, CKMB, CKMBINDEX, TROPONINI in the last 168 hours. BNP (last 3 results) No results for input(s): PROBNP in the last 8760 hours. HbA1C: No results for input(s): HGBA1C in the last 72 hours. CBG: No results for input(s): GLUCAP in the last 168 hours. Lipid Profile: No results for input(s): CHOL, HDL, LDLCALC, TRIG, CHOLHDL, LDLDIRECT in the last 72 hours. Thyroid Function Tests: No results for input(s): TSH, T4TOTAL, FREET4, T3FREE, THYROIDAB in the last 72 hours. Anemia Panel: No results for input(s): VITAMINB12, FOLATE, FERRITIN, TIBC, IRON, RETICCTPCT in the last 72 hours. Sepsis Labs: No results for input(s): PROCALCITON, LATICACIDVEN in the last 168 hours.  Recent Results (from the past 240 hour(s))  Urine Culture     Status: Abnormal   Collection Time: 06/25/21  2:04 PM   Specimen: Urine, Catheterized  Result Value Ref Range Status   Specimen Description   Final    URINE, CATHETERIZED Performed at [facility], [address], [place], [place] [address]    Special Requests   Final    NONE Performed at [facility], [place], [place]  [address]    Culture (A)  Final    >=100,000 COLONIES/mL ESCHERICHIA COLI 20,000 COLONIES/mL PSEUDOMONAS AERUGINOSA    Report Status 06/28/2021 FINAL  Final   Organism ID, Bacteria ESCHERICHIA COLI (A)  Final   Organism ID, Bacteria PSEUDOMONAS AERUGINOSA (A)  Final      Susceptibility   Escherichia coli - MIC*    AMPICILLIN >=32 RESISTANT Resistant     CEFAZOLIN <=4 SENSITIVE Sensitive     CEFEPIME <=0.12 SENSITIVE Sensitive     CEFTRIAXONE <=0.25 SENSITIVE Sensitive     CIPROFLOXACIN <=0.25 SENSITIVE Sensitive     GENTAMICIN <=1 SENSITIVE Sensitive     IMIPENEM <=0.25 SENSITIVE Sensitive     NITROFURANTOIN <=16 SENSITIVE Sensitive     TRIMETH/SULFA <=20 SENSITIVE Sensitive     AMPICILLIN/SULBACTAM >=32 RESISTANT Resistant     PIP/TAZO 64 INTERMEDIATE Intermediate     * >=100,000 COLONIES/mL ESCHERICHIA COLI   Pseudomonas aeruginosa - MIC*    CEFTAZIDIME >=64 RESISTANT Resistant     CIPROFLOXACIN <=0.25 SENSITIVE Sensitive     GENTAMICIN <=1 SENSITIVE Sensitive     IMIPENEM 2 SENSITIVE Sensitive     CEFEPIME >=32 RESISTANT Resistant     * 20,000 COLONIES/mL PSEUDOMONAS AERUGINOSA  Radiology Studies: No results found.      Scheduled Meds:  amLODipine  10 mg Oral Daily   aspirin EC  81 mg Oral Daily   citalopram  10 mg Oral Daily   docusate sodium  100 mg Oral BID   feeding supplement (NEPRO CARB STEADY)  237 mL Oral TID BM   mouth rinse  15 mL Mouth Rinse BID   metoprolol tartrate  25 mg Oral BID   multivitamin with minerals  1 tablet Oral Daily   pantoprazole  40 mg Oral Daily   senna-docusate  1 tablet Oral BID   sodium hypochlorite   Irrigation Q0200   vitamin B-12  1,000 mcg Oral Daily   Continuous Infusions:   LOS: 41 days    Time spent: 22 minutes    Sharen Hones, MD [facility]   To contact the attending provider between 7A-7P or the covering provider during after hours 7P-7A, please log into the web site [email or web address] and access  using universal [place] password for that web site. If you do not have the password, please call the hospital operator.  07/02/2021, 12:45 PM

## 2021-07-03 DIAGNOSIS — G9340 Encephalopathy, unspecified: Secondary | ICD-10-CM | POA: Diagnosis not present

## 2021-07-03 DIAGNOSIS — A419 Sepsis, unspecified organism: Secondary | ICD-10-CM | POA: Diagnosis not present

## 2021-07-03 DIAGNOSIS — N179 Acute kidney failure, unspecified: Secondary | ICD-10-CM | POA: Diagnosis not present

## 2021-07-03 DIAGNOSIS — L89324 Pressure ulcer of left buttock, stage 4: Secondary | ICD-10-CM

## 2021-07-03 DIAGNOSIS — L89321 Pressure ulcer of left buttock, stage 1: Secondary | ICD-10-CM | POA: Diagnosis not present

## 2021-07-03 DIAGNOSIS — L89312 Pressure ulcer of right buttock, stage 2: Secondary | ICD-10-CM | POA: Diagnosis not present

## 2021-07-03 DIAGNOSIS — R6520 Severe sepsis without septic shock: Secondary | ICD-10-CM | POA: Diagnosis not present

## 2021-07-03 MED ORDER — DAKINS (1/4 STRENGTH) 0.125 % EX SOLN
Freq: Every day | CUTANEOUS | Status: DC
  Filled 2021-07-03: qty 473

## 2021-07-03 MED ORDER — COLLAGENASE 250 UNIT/GM EX OINT
TOPICAL_OINTMENT | Freq: Every day | CUTANEOUS | Status: DC
  Filled 2021-07-03: qty 30

## 2021-07-03 NOTE — Progress Notes (Signed)
Patient is not eating much at all. Maybe 1-2 bites of each meal or none at all. MD was made aware. She is not drinking much either.

## 2021-07-03 NOTE — TOC Progression Note (Signed)
Transition of Care [facility]) - Progression Note    Patient Details  Name: Desiree Mitchell MRN: [id number] Date of Birth: [birth year]  Transition of Care [facility]) CM/SW [place], LCSW Phone Number: 07/03/2021, 9:08 AM  Clinical Narrative:  Left [facility] admissions coordinator a voicemail to check auth status.   Expected Discharge Plan: Skilled Nursing Facility Barriers to Discharge: Insurance Authorization  Expected Discharge Plan and Services Expected Discharge Plan: [place] In-house Referral: Clinical Social Work Discharge Planning Services: CM Consult Post Acute Care Choice: [place] Living arrangements for the past 2 months: Single Family Home                 DME Arranged: N/A DME Agency: NA       HH Arranged: NA HH Agency: NA         Social Determinants of Health (SDOH) Interventions    Readmission Risk Interventions No flowsheet data found.

## 2021-07-03 NOTE — NC FL2 (Signed)
[place] LEVEL OF CARE SCREENING TOOL     IDENTIFICATION  Patient Name: Desiree Mitchell Birthdate: 1956-02-21 Sex: female Admission Date (Current Location): 05/21/2021  [facility] and [place] Number:  Engineering geologist and Address:  [facility], [address] [place], [place] [address]      Provider Number: [id number]  Attending Physician Name and Address:  Sharen Hones, MD  Relative Name and Phone Number:  newcomb,tonya (Daughter)   [phone]    Current Level of Care: Hospital Recommended Level of Care: [place] Prior Approval Number:    Date Approved/Denied:   PASRR Number: [id number]  Discharge Plan: SNF    Current Diagnoses: Patient Active Problem List   Diagnosis Date Noted   Pressure injury of left buttock, stage 4 ([place]) 07/03/2021   DNR (do not resuscitate) 06/09/2021   Palliative care by specialist    Acute embolic stroke ([place]) 94/70/9628   Sepsis ([place]) 05/22/2021   Endotracheally intubated 05/22/2021   On mechanically assisted ventilation ([place]) 05/22/2021   Shunt malfunction 05/22/2021   Rhabdomyolysis 05/22/2021   AKI (acute kidney injury) ([place]) 05/22/2021   Increased anion gap metabolic acidosis 36/62/9476   Acute encephalopathy 05/22/2021   Transaminitis 05/22/2021   Pressure injury of skin 05/22/2021    Orientation RESPIRATION BLADDER Height & Weight     Self, Place  Normal Incontinent, External catheter Weight: 223 lb 11.2 oz (101.5 kg) Height:  5\' 2"  (157.5 cm)  BEHAVIORAL SYMPTOMS/MOOD NEUROLOGICAL BOWEL NUTRITION STATUS   None  (None) Incontinent Diet (DYS 2. Fluid nectar thick. NO STRAWS!  Extra gravy on MINCED meats, potatoes.)  AMBULATORY STATUS COMMUNICATION OF NEEDS Skin   Extensive Assist Verbally Skin abrasions, Bruising, Other (Comment), PU Stage and Appropriate Care (Rash. Deep tissue injuries on right buttocks (Dakins soaked gauze, foam daily) and cervical (foam daily).  Unstageable pressure injury on left buttocks (gauze daily).)   PU Stage 2 Dressing: Daily (Coccyx: Dakins soaked gauze, foam.)                   Personal Care Assistance Level of Assistance  Bathing, Feeding, Dressing Bathing Assistance: Maximum assistance Feeding assistance: Limited assistance Dressing Assistance: Maximum assistance     Functional Limitations Info  Sight, Hearing, Speech Sight Info: Adequate Hearing Info: Adequate Speech Info: Adequate    SPECIAL CARE FACTORS FREQUENCY  PT (By licensed PT), OT (By licensed OT), Speech therapy     PT Frequency: 5 x week OT Frequency: 5 x week     Speech Therapy Frequency: 5 x week      Contractures Contractures Info: Not present    Additional Factors Info  Code Status, Allergies Code Status Info: DNR Allergies Info: NKDA           Current Medications (07/03/2021):  This is the current hospital active medication list Current Facility-Administered Medications  Medication Dose Route Frequency Provider Last Rate Last Admin   acetaminophen (TYLENOL) tablet 650 mg  650 mg Oral Q6H PRN Jordan Hawks, FNP   650 mg at 07/01/21 2050   Or   acetaminophen (TYLENOL) suppository 650 mg  650 mg Rectal Q6H PRN Jordan Hawks, FNP       albuterol (PROVENTIL) (2.5 MG/3ML) 0.083% nebulizer solution 2.5 mg  2.5 mg Nebulization Q4H PRN [place], Britton L, NP       amLODipine (NORVASC) tablet 10 mg  10 mg Oral Daily Nolberto Hanlon, MD   10 mg at 07/03/21 5465   aspirin EC  tablet 81 mg  81 mg Oral Daily Barb Merino, MD   81 mg at 07/03/21 0935   citalopram (CELEXA) tablet 10 mg  10 mg Oral Daily Sharen Hones, MD   10 mg at 07/03/21 0936   docusate sodium (COLACE) capsule 100 mg  100 mg Oral BID Sharen Hones, MD   100 mg at 07/03/21 0936   feeding supplement (NEPRO CARB STEADY) liquid 237 mL  237 mL Oral TID BM Barb Merino, MD 0 mL/hr at 07/02/21 0451 237 mL at 07/03/21 1405   MEDLINE mouth rinse  15 mL Mouth Rinse BID  Sharion Settler, NP   15 mL at 07/02/21 2259   metoprolol tartrate (LOPRESSOR) tablet 25 mg  25 mg Oral BID [place] Eric J, DO   25 mg at 07/03/21 1517   multivitamin with minerals tablet 1 tablet  1 tablet Oral Daily Barb Merino, MD   1 tablet at 07/03/21 0937   ondansetron (ZOFRAN-ODT) disintegrating tablet 4 mg  4 mg Oral Q6H PRN Jordan Hawks, FNP       pantoprazole (PROTONIX) EC tablet 40 mg  40 mg Oral Daily Barb Merino, MD   40 mg at 07/03/21 6160   polyvinyl alcohol (LIQUIFILM TEARS) 1.4 % ophthalmic solution 1 drop  1 drop Both Eyes QID PRN Jordan Hawks, FNP       senna-docusate (Senokot-S) tablet 1 tablet  1 tablet Oral BID Nolberto Hanlon, MD   1 tablet at 07/03/21 0935   sodium hypochlorite (DAKIN'S 1/4 STRENGTH) topical solution   Irrigation Q0200 Leonel Ramsay, MD   Given at 07/02/21 7371   vitamin B-12 (CYANOCOBALAMIN) tablet 1,000 mcg  1,000 mcg Oral Daily Barb Merino, MD   1,000 mcg at 07/03/21 0626     Discharge Medications: Please see discharge summary for a list of discharge medications.  Relevant Imaging Results:  Relevant Lab Results:   Additional Information SS#: [id number]  Candie Chroman, LCSW

## 2021-07-03 NOTE — Progress Notes (Signed)
PROGRESS NOTE    Desiree Mitchell  WYO:[id number] DOB: 05/30/1956 DOA: 05/21/2021 PCP: Pcp, No    Brief Narrative:  Patient is a 65 year old female with history of breast cancer status post bilateral mastectomy, history of VA shunt  and prior subarachnoid hemorrhage/hydrocephalus, CVA with residual right-sided deficit, asthma, hypertension who presented at emergency department after she was found unresponsive by family.  On presentation, she was hypoxic, hypotensive.  She was also found to be infested with bedbugs and cockroaches.  CT head without contrast showed development of mild hydrocephalus, periventricular edema associated with malpositioned left VA shunt.  She was seen by neurosurgery and was urgently  taken to the OR for removal of her VA shunt and placement of external ventricular drain.  Hospital course also remarkable for bacteremia for which ID was following.  Patient was transferred to [facility] on 05/30/2021.  Several discussions were held with family about goals of care.  Initially she was on comfort care but since her mental status slowly improved, family wanted to discontinue comfort care,then family decided on placing her in a skilled nursing facility.   Hospice agency was also following and recommending hospice support on long-term care.  Since last several days, she has remained hemodynamically stable, overall comfortable.  Medically stable for discharge to SNF whenever possible.TOC following   Assessment & Plan:   Principal Problem:   Sepsis ([place]) Active Problems:   Endotracheally intubated   On mechanically assisted ventilation (HCC)   Shunt malfunction   Rhabdomyolysis   AKI (acute kidney injury) ([place])   Increased anion gap metabolic acidosis   Acute encephalopathy   Transaminitis   Pressure injury of skin   Acute embolic stroke [facility])   Palliative care by specialist   DNR (do not resuscitate)  New UTI. Severe sepsis/Proteus/staph epidermidis septicemia: secondary to  wounds on left buttock Left buttock stage 4 pressure ulcer.   Patient has completed all antibiotics.  Reexam patient left buttock wound, the wound is very deep.  Some secretion, does not seem to be infected.  However, this may need some debridement.  I will obtain general surgery consult, continue wound care.    Depression. Patient developed significant depression while in hospital, started citalopram.  Postop acute hypoxemic respiratory failure.   VP shunt malfunction. 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 History of stroke. Stable and improved.   B12 deficiency. Continue oral supplement.  Dysphagia. Continue dysphagia diet.      DVT prophylaxis: SCDs Code Status: DNR Family Communication: Updated daughter at bedside. Disposition Plan:      Status is: Inpatient   Remains inpatient appropriate because: Unsafe discharge plan,         I/O last 3 completed shifts: In: 480 [P.O.:480] Out: 375 [Urine:375] Total I/O In: -  Out: 400 [Urine:400]     Consultants:  General surgery  Procedures: None  Antimicrobials: None  Subjective: Patient doing well today, her mood is better today.  She has good appetite, no nausea vomiting abdominal pain. Denies any short of breath or cough.   Objective: Vitals:   07/02/21 2052 07/03/21 0540 07/03/21 0748 07/03/21 1146  BP: (!) 145/70 (!) 161/84 (!) 147/79 139/70  Pulse: 87 92 94 79  Resp: 20 16 20 16   Temp: 98.7 F (37.1 C) 98.7 F (37.1 C) 98.2 F (36.8 C) 97.7 F (36.5 C)  TempSrc: Oral Oral    SpO2: 94% 93% 95% 96%  Weight:      Height:  Intake/Output Summary (Last 24 hours) at 07/03/2021 1328 Last data filed at 07/03/2021 0715 Gross per 24 hour  Intake 120 ml  Output 625 ml  Net -505 ml   Filed Weights   06/30/21 0500 07/01/21 0442 07/02/21 0500  Weight: 99.5 kg 102.7 kg 101.5 kg    Examination:  General exam: Appears calm and comfortable  Respiratory system:  Clear to auscultation. Respiratory effort normal. Cardiovascular system: S1 & S2 heard, RRR. No JVD, murmurs, rubs, gallops or clicks. No pedal edema. Gastrointestinal system: Abdomen is nondistended, soft and nontender. No organomegaly or masses felt. Normal bowel sounds heard. Central nervous system: Alert and oriented x2. No focal neurological deficits. Extremities: Symmetric 5 x 5 power. Skin: No rashes, lesions or ulcers Psychiatry:  Mood & affect appropriate.     Data Reviewed: I have personally reviewed following labs and imaging studies  CBC: No results for input(s): WBC, NEUTROABS, HGB, HCT, MCV, PLT in the last 168 hours. Basic Metabolic Panel: No results for input(s): NA, K, CL, CO2, GLUCOSE, BUN, CREATININE, CALCIUM, MG, PHOS in the last 168 hours. GFR: Estimated Creatinine Clearance: 76.5 mL/min (by C-G formula based on SCr of 0.84 mg/dL). Liver Function Tests: No results for input(s): AST, ALT, ALKPHOS, BILITOT, PROT, ALBUMIN in the last 168 hours. No results for input(s): LIPASE, AMYLASE in the last 168 hours. No results for input(s): AMMONIA in the last 168 hours. Coagulation Profile: No results for input(s): INR, PROTIME in the last 168 hours. Cardiac Enzymes: No results for input(s): CKTOTAL, CKMB, CKMBINDEX, TROPONINI in the last 168 hours. BNP (last 3 results) No results for input(s): PROBNP in the last 8760 hours. HbA1C: No results for input(s): HGBA1C in the last 72 hours. CBG: No results for input(s): GLUCAP in the last 168 hours. Lipid Profile: No results for input(s): CHOL, HDL, LDLCALC, TRIG, CHOLHDL, LDLDIRECT in the last 72 hours. Thyroid Function Tests: No results for input(s): TSH, T4TOTAL, FREET4, T3FREE, THYROIDAB in the last 72 hours. Anemia Panel: No results for input(s): VITAMINB12, FOLATE, FERRITIN, TIBC, IRON, RETICCTPCT in the last 72 hours. Sepsis Labs: No results for input(s): PROCALCITON, LATICACIDVEN in the last 168 hours.  Recent  Results (from the past 240 hour(s))  Urine Culture     Status: Abnormal   Collection Time: 06/25/21  2:04 PM   Specimen: Urine, Catheterized  Result Value Ref Range Status   Specimen Description   Final    URINE, CATHETERIZED Performed at [facility], [address] [place], [place] [address]    Special Requests   Final    NONE Performed at [facility], [address] [place], [place] [address]    Culture (A)  Final    >=100,000 COLONIES/mL ESCHERICHIA COLI 20,000 COLONIES/mL PSEUDOMONAS AERUGINOSA    Report Status 06/28/2021 FINAL  Final   Organism ID, Bacteria ESCHERICHIA COLI (A)  Final   Organism ID, Bacteria PSEUDOMONAS AERUGINOSA (A)  Final      Susceptibility   Escherichia coli - MIC*    AMPICILLIN >=32 RESISTANT Resistant     CEFAZOLIN <=4 SENSITIVE Sensitive     CEFEPIME <=0.12 SENSITIVE Sensitive     CEFTRIAXONE <=0.25 SENSITIVE Sensitive     CIPROFLOXACIN <=0.25 SENSITIVE Sensitive     GENTAMICIN <=1 SENSITIVE Sensitive     IMIPENEM <=0.25 SENSITIVE Sensitive     NITROFURANTOIN <=16 SENSITIVE Sensitive     TRIMETH/SULFA <=20 SENSITIVE Sensitive     AMPICILLIN/SULBACTAM >=32 RESISTANT Resistant     PIP/TAZO 64 INTERMEDIATE  Intermediate     * >=100,000 COLONIES/mL ESCHERICHIA COLI   Pseudomonas aeruginosa - MIC*    CEFTAZIDIME >=64 RESISTANT Resistant     CIPROFLOXACIN <=0.25 SENSITIVE Sensitive     GENTAMICIN <=1 SENSITIVE Sensitive     IMIPENEM 2 SENSITIVE Sensitive     CEFEPIME >=32 RESISTANT Resistant     * 20,000 COLONIES/mL PSEUDOMONAS AERUGINOSA         Radiology Studies: No results found.      Scheduled Meds:  amLODipine  10 mg Oral Daily   aspirin EC  81 mg Oral Daily   citalopram  10 mg Oral Daily   docusate sodium  100 mg Oral BID   feeding supplement (NEPRO CARB STEADY)  237 mL Oral TID BM   mouth rinse  15 mL Mouth Rinse BID   metoprolol tartrate  25 mg Oral BID   multivitamin with minerals  1 tablet Oral  Daily   pantoprazole  40 mg Oral Daily   senna-docusate  1 tablet Oral BID   sodium hypochlorite   Irrigation Q0200   vitamin B-12  1,000 mcg Oral Daily   Continuous Infusions:   LOS: 42 days    Time spent: 27 minutes    Sharen Hones, MD [facility]   To contact the attending provider between 7A-7P or the covering provider during after hours 7P-7A, please log into the web site [email or web address] and access using universal [place] password for that web site. If you do not have the password, please call the hospital operator.  07/03/2021, 1:28 PM

## 2021-07-03 NOTE — Progress Notes (Signed)
Nutrition Follow-up  DOCUMENTATION CODES:   Obesity unspecified  INTERVENTION:   -Continue Nepro Shake po TID, each supplement provides 425 kcal and 19 grams protein  -Continue MVI with minerals daily -Continue Magic cup BID with meals, each supplement provides 290 kcal and 9 grams of protein    NUTRITION DIAGNOSIS:   Inadequate oral intake related to acute illness as evidenced by NPO status.  Progressing; advanced to PO diet on 06/12/21  GOAL:   Patient will meet greater than or equal to 90% of their needs  Progressing   MONITOR:   PO intake, Supplement acceptance, Diet advancement, Labs, Weight trends, Skin, I & O's  REASON FOR ASSESSMENT:   Consult Assessment of nutrition requirement/status  ASSESSMENT:   65 y/o female with h/o HTN, asthma, breast cancer, CVA and subarachnoid hemorrhage s/p shunt placement for management of hydrocephalus > 10 years ago who is now admitted with shunt malfunction, sepsis and AMS now s/p left parietal approach ventriculostomy 10/11  10/28- refused PEG placement, transitioned to comfort care 10/31- comfort care rescinded  11/1- s/p BSE- advanced to dysphagia 2 diet with nectar thick liquids  Reviewed I/O's: -135 ml x 24 hours and -6.1 L since 06/19/21  UOP: 375 ml x 24 hours  Pt remains with poor oral intake. Noted meal completions 20-70%, averaging around 50% of meals. She is consuming Nepro supplements.    Pt family rescinded comfort care, with hopes that pt will improve.    Per TOC notes, pt medically stable for discharge and awaiting insurance authorization for SNF.   Medications reviewed and include colace, senokot, and vitamin B-12.   Labs reviewed: CBGS: 83 (inpatient orders for glycemic control are none).    Diet Order:   Diet Order             DIET DYS 2 Room service appropriate? Yes with Assist; Fluid consistency: Nectar Thick  Diet effective now                   EDUCATION NEEDS:   No education needs have  been identified at this time  Skin:  Skin Assessment: Skin Integrity Issues: Skin Integrity Issues:: Unstageable DTI: rt buttocks, cervical Stage II: coccyx Unstageable: bilateral buttocks Incisions: closed head and lt thorat  Last BM:  07/02/21  Height:   Ht Readings from Last 1 Encounters:  06/04/21 5\' 2"  (1.575 m)    Weight:   Wt Readings from Last 1 Encounters:  07/02/21 101.5 kg    Ideal Body Weight:  50 kg  BMI:  Body mass index is 40.92 kg/m.  Estimated Nutritional Needs:   Kcal:  1800-2100kcal/day  Protein:  90-105g/day  Fluid:  1.5-1.8L/day    Loistine Chance, RD, LDN, [place] Registered Dietitian II Certified Diabetes Care and Education Specialist Please refer to AMION for RD and/or RD on-call/weekend/after hours pager

## 2021-07-03 NOTE — Consult Note (Signed)
[facility] SURGICAL CONSULTATION NOTE (initial) - cpt: 16109   HISTORY OF PRESENT ILLNESS (HPI):  65 y.o. female initially admitted to [facility] ED on 10/10 after being found unresponsive by her family and in disheveled condition. Work up in the ED was concerning for hydrocephalus and VP shunt malfunction along with bacteremia/sepsis. She was taken urgently to the OR by neurosurgery for VP shunt removal and placement of external VP shunt. Initially tried Santyl dressings. She has had a very long admission since this time. Appears at one point she was made comfort care, but her family rescinded this as her mentation improved. She is pending potential placement at SNF. She was also found to have pressure injuries secondary to her prolonged time down at home. WOC RN was initially consulted on 10/13 with concerns over 4 x 5 cm pressure injury to the right ischial tuberosity and unstageable left ischial tuberosity wound. Switched to 1/4% Dakins solution on 11/07 for 7 days which was restarted on 11/18.   Surgery is consulted by hospitalist physician Dr. Sharen Hones, MD in this context for evaluation and management of bilateral ischial tuberosity pressure ulcers.   PAST MEDICAL HISTORY (PMH):  History reviewed. No pertinent past medical history.   PAST SURGICAL HISTORY [facility]):  Past Surgical History:  Procedure Laterality Date   SHUNT REMOVAL Left 05/21/2021   Procedure: SHUNT REMOVAL;  Surgeon: Meade Maw, MD;  Location: [facility] ORS;  Service: Neurosurgery;  Laterality: Left;   TEE WITHOUT CARDIOVERSION N/A 05/29/2021   Procedure: TRANSESOPHAGEAL ECHOCARDIOGRAM (TEE);  Surgeon: Corey Skains, MD;  Location: [facility] ORS;  Service: Cardiovascular;  Laterality: N/A;   VENTRICULOSTOMY Left 05/21/2021   Procedure: VENTRICULOSTOMY;  Surgeon: Meade Maw, MD;  Location: [facility] ORS;  Service: Neurosurgery;  Laterality: Left;     MEDICATIONS:  Prior to Admission medications    Medication Sig Start Date End Date Taking? Authorizing Provider  oxyCODONE (ROXICODONE) 5 MG immediate release tablet Take 1 tablet (5 mg total) by mouth every 8 (eight) hours as needed. 06/19/21 06/19/22 Yes Shelly Coss, MD     ALLERGIES:  No Known Allergies   SOCIAL HISTORY:  Social History   Socioeconomic History   Marital status: Widowed    Spouse name: Not on file   Number of children: Not on file   Years of education: Not on file   Highest education level: Not on file  Occupational History   Not on file  Tobacco Use   Smoking status: Former    Types: Cigarettes   Smokeless tobacco: Former  Substance and Sexual Activity   Alcohol use: Not on file   Drug use: Not on file   Sexual activity: Not on file  Other Topics Concern   Not on file  Social History Narrative   Not on file   Social Determinants of Health   Financial Resource Strain: Not on file  Food Insecurity: Not on file  Transportation Needs: Not on file  Physical Activity: Not on file  Stress: Not on file  Social Connections: Not on file  Intimate Partner Violence: Not on file     FAMILY HISTORY:  History reviewed. No pertinent family history.    REVIEW OF SYSTEMS:  Review of Systems  Constitutional:  Negative for chills and fever.  Respiratory:  Negative for cough and shortness of breath.   Cardiovascular:  Negative for chest pain and palpitations.  Gastrointestinal:  Negative for abdominal pain, nausea and vomiting.  Genitourinary:  Negative for dysuria and urgency.  Skin:        + Bilateral ischial tuberosity pressure injuries  All other systems reviewed and are negative.  VITAL SIGNS:  Temp:  [97.7 F (36.5 C)-98.9 F (37.2 C)] 97.7 F (36.5 C) (11/22 1146) Pulse Rate:  [79-94] 79 (11/22 1146) Resp:  [15-20] 16 (11/22 1146) BP: (139-161)/(70-84) 139/70 (11/22 1146) SpO2:  [93 %-96 %] 96 % (11/22 1146)     Height: 5\' 2"  (157.5 cm) Weight: 101.5 kg BMI (Calculated): 40.91    INTAKE/OUTPUT:  11/21 0701 - 11/22 0700 In: 240 [P.O.:240] Out: 375 [Urine:375]  PHYSICAL EXAM:  Physical Exam Vitals and nursing note reviewed. Exam conducted with a chaperone present.  Constitutional:      General: She is not in acute distress.    Appearance: She is obese. She is not ill-appearing.  HENT:     Head: Normocephalic and atraumatic.  Eyes:     Conjunctiva/sclera: Conjunctivae normal.     Pupils: Pupils are equal, round, and reactive to light.  Cardiovascular:     Rate and Rhythm: Normal rate and regular rhythm.     Pulses: Normal pulses.     Heart sounds: No murmur heard. Pulmonary:     Effort: Pulmonary effort is normal. No respiratory distress.  Skin:    General: Skin is warm and dry.       Neurological:     Mental Status: She is alert. Mental status is at baseline.   Left Ischial tuberosity Pressure Injury (07/03/2021):    Right Ischial tuberosity Pressure Injury (07/03/2021):     Labs:  CBC Latest Ref Rng & Units 06/19/2021 06/16/2021 06/13/2021  WBC 4.0 - 10.5 K/uL 10.7(H) 12.7(H) -  Hemoglobin 12.0 - 15.0 g/dL 11.0(L) 10.5(L) 10.6(L)  Hematocrit 36.0 - 46.0 % 34.6(L) 33.8(L) 33.3(L)  Platelets 150 - 400 K/uL 574(H) 641(H) -   CMP Latest Ref Rng & Units 06/16/2021 06/13/2021 06/07/2021  Glucose 70 - 99 mg/dL 108(H) 124(H) 121(H)  BUN 8 - 23 mg/dL 25(H) 25(H) 28(H)  Creatinine 0.44 - 1.00 mg/dL 0.84 0.93 0.80  Sodium 135 - 145 mmol/L 144 142 137  Potassium 3.5 - 5.1 mmol/L 3.8 4.1 4.6  Chloride 98 - 111 mmol/L 109 107 101  CO2 22 - 32 mmol/L 28 29 30   Calcium 8.9 - 10.3 mg/dL 8.2(L) 8.1(L) 7.9(L)  Total Protein 6.5 - 8.1 g/dL - 6.3(L) -  Total Bilirubin 0.3 - 1.2 mg/dL - 0.6 -  Alkaline Phos 38 - 126 U/L - 63 -  AST 15 - 41 U/L - 19 -  ALT 0 - 44 U/L - 13 -     Imaging studies:  No new pertinent imaging studies   Assessment/Plan: (ICD-10's: L89.90) 65 y.o. female with a stage 2 pressure injury to the right ischial tuberosity with a  small area of fibrinous tissue without evidence of infection/necrosis and a stage pressure injury to the right ischial tuberosity without infection//necrosis, complicated by pertinent comorbidities including deconditioning.   - Dr Hampton Abbot was able to debride the fibrinous tissue from the left ischial pressure injury.   - Nothing need from the right ischial wound  - wound Care; okay to stop Dakins and transition to santyl dressing daily + PRN   - Continue frequent repositioning, pressure offloading, low air loss mattress.    - Further management per primary service; we will sign off. Please call with questions/concerns  All of the above findings and recommendations were discussed with the patient, and all of patient's questions were  answered to her expressed satisfaction.  Thank you for the opportunity to participate in this patient's care.   -- Edison Simon, PA-C [facility] 07/03/2021, 1:33 PM [phone] M-F: 7am - 4pm

## 2021-07-03 NOTE — Consult Note (Signed)
WOC consulted for sacral wound, discussed wound with patient's nurse yesterday 07/02/21. Orders updated.  Will not consult for that reason [address], [place], [place]

## 2021-07-04 DIAGNOSIS — G9340 Encephalopathy, unspecified: Secondary | ICD-10-CM | POA: Diagnosis not present

## 2021-07-04 DIAGNOSIS — I639 Cerebral infarction, unspecified: Secondary | ICD-10-CM | POA: Diagnosis not present

## 2021-07-04 DIAGNOSIS — J9600 Acute respiratory failure, unspecified whether with hypoxia or hypercapnia: Secondary | ICD-10-CM | POA: Diagnosis not present

## 2021-07-04 DIAGNOSIS — N179 Acute kidney failure, unspecified: Secondary | ICD-10-CM | POA: Diagnosis not present

## 2021-07-04 NOTE — Progress Notes (Signed)
PROGRESS NOTE    Desiree Mitchell  IOX:[id number] DOB: Jul 10, 1956 DOA: 05/21/2021 PCP: Pcp, No    Brief Narrative:  Patient is a 65 year old female with history of breast cancer status post bilateral mastectomy, history of VA shunt  and prior subarachnoid hemorrhage/hydrocephalus, CVA with residual right-sided deficit, asthma, hypertension who presented at emergency department after she was found unresponsive by family.  On presentation, she was hypoxic, hypotensive.  She was also found to be infested with bedbugs and cockroaches.  CT head without contrast showed development of mild hydrocephalus, periventricular edema associated with malpositioned left VA shunt.  She was seen by neurosurgery and was urgently  taken to the OR for removal of her VA shunt and placement of external ventricular drain.  Hospital course also remarkable for bacteremia for which ID was following.  Patient was transferred to [facility] on 05/30/2021.  Several discussions were held with family about goals of care.  Initially she was on comfort care but since her mental status slowly improved, family wanted to discontinue comfort care,then family decided on placing her in a skilled nursing facility.   Hospice agency was also following and recommending hospice support on long-term care.  Since last several days, she has remained hemodynamically stable, overall comfortable.  Medically stable for discharge to SNF whenever possible.TOC following  11/23 no overnight issues   Assessment & Plan:   Principal Problem:   Sepsis ([place]) Active Problems:   Endotracheally intubated   On mechanically assisted ventilation (HCC)   Shunt malfunction   Rhabdomyolysis   AKI (acute kidney injury) ([place])   Increased anion gap metabolic acidosis   Acute encephalopathy   Transaminitis   Pressure injury of skin   Acute embolic stroke ([place])   Palliative care by specialist   DNR (do not resuscitate)   Pressure injury of left buttock, stage 4  ([place])  New UTI. Severe sepsis/Proteus/staph epidermidis septicemia: secondary to wounds on left buttock Left buttock stage 4 pressure ulcer.   Patient has completed all antibiotics.  Reexam patient left buttock wound, the wound is very deep.   11/23 gsx input was appreciated.  Left gluteal wound is fibrinous material and was debrided at bedside, otherwise has healthy granulation tissue and no evidence of infection.  Right gluteal wound is also healthy with good granulation tissue.  For now General surgery recommends Santyl on the left wound to continue helping with cleaning the fibrinous material.  Can do wet-to-dry saline gauze dressing changes on the right wound.  No surgical need at this time. General surgery signed off.    Depression. Was started on citalopram     Postop acute hypoxemic respiratory failure.   VP shunt malfunction. 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 History of stroke. Stable improved   B12 deficiency. Continue oral supplement     Dysphagia. Continue dysphagia diet       DVT prophylaxis: SCDs Code Status: DNR Family Communication: None at bedside Disposition Plan:      Status is: Inpatient   Remains inpatient appropriate because: Unsafe discharge plan,         I/O last 3 completed shifts: In: 120 [P.O.:120] Out: 400 [Urine:400] No intake/output data recorded.     Consultants:  General surgery  Procedures: None  Antimicrobials: None  Subjective: Patient denies shortness of breath, chest pain, or any other complaints  Objective: Vitals:   07/03/21 1605 07/03/21 2041 07/04/21 0607 07/04/21 0819  BP: 133/68 (!) 142/86 (!) 151/75 (!) 144/84  Pulse: 74 79  73 77  Resp: 18 16  17   Temp: 98.3 F (36.8 C) 98.5 F (36.9 C) 98.4 F (36.9 C) 98.3 F (36.8 C)  TempSrc: Oral   Oral  SpO2: 98% 96% 94% 95%  Weight:      Height:        Intake/Output Summary (Last 24 hours) at 07/04/2021 0852 Last data  filed at 07/03/2021 1829 Gross per 24 hour  Intake 120 ml  Output --  Net 120 ml   Filed Weights   06/30/21 0500 07/01/21 0442 07/02/21 0500  Weight: 99.5 kg 102.7 kg 101.5 kg    Examination: Calm, NAD Cta no w/r Reg s1/s2 no gallops Soft benign +bs No edema Awake and alert    Data Reviewed: I have personally reviewed following labs and imaging studies  CBC: No results for input(s): WBC, NEUTROABS, HGB, HCT, MCV, PLT in the last 168 hours. Basic Metabolic Panel: No results for input(s): NA, K, CL, CO2, GLUCOSE, BUN, CREATININE, CALCIUM, MG, PHOS in the last 168 hours. GFR: Estimated Creatinine Clearance: 76.5 mL/min (by C-G formula based on SCr of 0.84 mg/dL). Liver Function Tests: No results for input(s): AST, ALT, ALKPHOS, BILITOT, PROT, ALBUMIN in the last 168 hours. No results for input(s): LIPASE, AMYLASE in the last 168 hours. No results for input(s): AMMONIA in the last 168 hours. Coagulation Profile: No results for input(s): INR, PROTIME in the last 168 hours. Cardiac Enzymes: No results for input(s): CKTOTAL, CKMB, CKMBINDEX, TROPONINI in the last 168 hours. BNP (last 3 results) No results for input(s): PROBNP in the last 8760 hours. HbA1C: No results for input(s): HGBA1C in the last 72 hours. CBG: No results for input(s): GLUCAP in the last 168 hours. Lipid Profile: No results for input(s): CHOL, HDL, LDLCALC, TRIG, CHOLHDL, LDLDIRECT in the last 72 hours. Thyroid Function Tests: No results for input(s): TSH, T4TOTAL, FREET4, T3FREE, THYROIDAB in the last 72 hours. Anemia Panel: No results for input(s): VITAMINB12, FOLATE, FERRITIN, TIBC, IRON, RETICCTPCT in the last 72 hours. Sepsis Labs: No results for input(s): PROCALCITON, LATICACIDVEN in the last 168 hours.  Recent Results (from the past 240 hour(s))  Urine Culture     Status: Abnormal   Collection Time: 06/25/21  2:04 PM   Specimen: Urine, Catheterized  Result Value Ref Range Status   Specimen  Description   Final    URINE, CATHETERIZED Performed at [facility], [address] [place], [place] [address]    Special Requests   Final    NONE Performed at [facility], [address] [place], [place] [address]    Culture (A)  Final    >=100,000 COLONIES/mL ESCHERICHIA COLI 20,000 COLONIES/mL PSEUDOMONAS AERUGINOSA    Report Status 06/28/2021 FINAL  Final   Organism ID, Bacteria ESCHERICHIA COLI (A)  Final   Organism ID, Bacteria PSEUDOMONAS AERUGINOSA (A)  Final      Susceptibility   Escherichia coli - MIC*    AMPICILLIN >=32 RESISTANT Resistant     CEFAZOLIN <=4 SENSITIVE Sensitive     CEFEPIME <=0.12 SENSITIVE Sensitive     CEFTRIAXONE <=0.25 SENSITIVE Sensitive     CIPROFLOXACIN <=0.25 SENSITIVE Sensitive     GENTAMICIN <=1 SENSITIVE Sensitive     IMIPENEM <=0.25 SENSITIVE Sensitive     NITROFURANTOIN <=16 SENSITIVE Sensitive     TRIMETH/SULFA <=20 SENSITIVE Sensitive     AMPICILLIN/SULBACTAM >=32 RESISTANT Resistant     PIP/TAZO 64 INTERMEDIATE Intermediate     * >=100,000 COLONIES/mL ESCHERICHIA COLI  Pseudomonas aeruginosa - MIC*    CEFTAZIDIME >=64 RESISTANT Resistant     CIPROFLOXACIN <=0.25 SENSITIVE Sensitive     GENTAMICIN <=1 SENSITIVE Sensitive     IMIPENEM 2 SENSITIVE Sensitive     CEFEPIME >=32 RESISTANT Resistant     * 20,000 COLONIES/mL PSEUDOMONAS AERUGINOSA         Radiology Studies: No results found.      Scheduled Meds:  amLODipine  10 mg Oral Daily   aspirin EC  81 mg Oral Daily   citalopram  10 mg Oral Daily   collagenase   Topical Daily   docusate sodium  100 mg Oral BID   feeding supplement (NEPRO CARB STEADY)  237 mL Oral TID BM   mouth rinse  15 mL Mouth Rinse BID   metoprolol tartrate  25 mg Oral BID   multivitamin with minerals  1 tablet Oral Daily   pantoprazole  40 mg Oral Daily   senna-docusate  1 tablet Oral BID   sodium hypochlorite   Irrigation Q2000   vitamin B-12  1,000 mcg Oral Daily    Continuous Infusions:   LOS: 43 days    Time spent: 35 min with >50% on coc    Nolberto Hanlon, MD [facility]   To contact the attending provider between 7A-7P or the covering provider during after hours 7P-7A, please log into the web site [email or web address] and access using universal [place] password for that web site. If you do not have the password, please call the hospital operator.  07/04/2021, 8:52 AM

## 2021-07-04 NOTE — Progress Notes (Signed)
Nutrition Follow-up  DOCUMENTATION CODES:   Obesity unspecified  INTERVENTION:   -Continue Nepro Shake po TID, each supplement provides 425 kcal and 19 grams protein  -Continue MVI with minerals daily -Continue Magic cup BID with meals, each supplement provides 290 kcal and 9 grams of protein   NUTRITION DIAGNOSIS:   Inadequate oral intake related to acute illness as evidenced by NPO status.  Progressing; advanced to PO diet on 06/12/21  GOAL:   Patient will meet greater than or equal to 90% of their needs  Progressing   MONITOR:   PO intake, Supplement acceptance, Diet advancement, Labs, Weight trends, Skin, I & O's  REASON FOR ASSESSMENT:   Consult Assessment of nutrition requirement/status  ASSESSMENT:   65 y/o female with h/o HTN, asthma, breast cancer, CVA and subarachnoid hemorrhage s/p shunt placement for management of hydrocephalus > 10 years ago who is now admitted with shunt malfunction, sepsis and AMS now s/p left parietal approach ventriculostomy 10/11  10/28- refused PEG placement, transitioned to comfort care 10/31- comfort care rescinded  11/1- s/p BSE- advanced to dysphagia 2 diet with nectar thick liquids  Reviewed I/O's: -280 ml x 24 hours and -5.6 L since 06/20/21  UOP: 400 ml x 24 hours   Pt remains with poor oral intake. Noted meal completions 20-70%, averaging around 50% of meals. She is consuming Nepro supplements. Per RN notes, pt consuming bites and sips yesterday.    Pt family rescinded comfort care, with hopes that pt will improve. They do not desire PEG or artificial means of nutrition/hydration.   Per general surgery notes, pt with sacral wound; no plans for surgical debridement at this time and general surgery has signed off.    Per TOC notes, pt medically stable for discharge and awaiting insurance authorization for SNF.    Medications reviewed and include colace, senokot, and vitamin B-12.   Labs reviewed.   Diet Order:   Diet  Order             DIET DYS 2 Room service appropriate? Yes with Assist; Fluid consistency: Nectar Thick  Diet effective now                   EDUCATION NEEDS:   No education needs have been identified at this time  Skin:  Skin Assessment: Skin Integrity Issues: Skin Integrity Issues:: Unstageable DTI: rt buttocks, cervical Stage II: coccyx Unstageable: bilateral buttocks Incisions: closed head and lt thorat  Last BM:  07/02/21  Height:   Ht Readings from Last 1 Encounters:  06/04/21 5\' 2"  (1.575 m)    Weight:   Wt Readings from Last 1 Encounters:  07/02/21 101.5 kg    Ideal Body Weight:  50 kg  BMI:  Body mass index is 40.92 kg/m.  Estimated Nutritional Needs:   Kcal:  1800-2100kcal/day  Protein:  90-105g/day  Fluid:  1.5-1.8L/day    Loistine Chance, RD, LDN, [place] Registered Dietitian II Certified Diabetes Care and Education Specialist Please refer to AMION for RD and/or RD on-call/weekend/after hours pager

## 2021-07-04 NOTE — Progress Notes (Signed)
Physical Therapy Treatment Patient Details Name: Desiree Mitchell MRN: [id number] DOB: [birth year] Today's Date: 07/04/2021   History of Present Illness Pt is a 65 y.o. female presenting to hospital 05/21/21 with AMS (found unresponsive by family).  Significant pressure sore present back of neck, buttocks, and coccyx.  Pt noted to be infested with bed bugs and roaches.  Pt admitted with acute respiratory failure, rhabdomyolysis, metabolic acidosis, acute kidney injury, sepsis, VA shunt malfunction, transaminitis, and posterior neck/buttocks/coccyx pressure injuries.  S/p removal ventriculoatrial shunt and placement of L parietal approach ventriculostomy 05/22/21; extubated 10/11; MRI brain 10/14 showing few scattered small acute infarcts involving different vascular territories (watershed type infarcts); TEE 10/18; and EVD removed 10/19.    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    PT Comments    Patient received in bed, Initially not verbalizing at all with [place]. Alert. She requires mod assist for supine to sit and is able to maintain sitting balance independently. She gave more effort during session than on prior occasions. Patient is able to stand with RW and mod +2 assist x 3 reps. Standing for brief periods only. Unable to shift weight in standing. Patient required min assist to return to supine from sitting. She will continue to benefit from skilled PT while here to improve functional independence and strength.        Recommendations for follow up therapy are one component of a multi-disciplinary discharge planning process, led by the attending physician.  Recommendations may be updated based on patient status, additional functional criteria and insurance authorization.  Follow Up Recommendations  Skilled nursing-short term rehab (<3 hours/day)     Assistance Recommended at Discharge Frequent or constant  Supervision/Assistance  Equipment Recommendations  None recommended by PT    Recommendations for Other Services       Precautions / Restrictions Precautions Precautions: Fall Precaution Comments: Aspiration; R chest port;  buttocks pressure injuries Restrictions Weight Bearing Restrictions: No     Mobility  Bed Mobility Overal bed mobility: Needs Assistance Bed Mobility: Supine to Sit;Sit to Supine     Supine to sit: Mod assist;HOB elevated Sit to supine: Min assist   General bed mobility comments: Patient assisted with bed mobility this session. Requires cues to initiate movement, but is able to assist more than on prior visits.    Transfers Overall transfer level: Needs assistance Equipment used: Rolling walker (2 wheels) Transfers: Sit to/from Stand Sit to Stand: Mod assist;+2 physical assistance           General transfer comment: STS x 3 reps with mod +2 assist. Requires cues for how she can assist with mobility.    Ambulation/Gait               General Gait Details: unable to take steps/shift weight in standing   Stairs             Wheelchair Mobility    Modified Rankin (Stroke Patients Only)       Balance Overall balance assessment: Needs assistance Sitting-balance support: Feet supported;Bilateral upper extremity supported Sitting balance-Leahy Scale: Good Sitting balance - Comments: patient able to maintain sitting balance without difficulty this session   Standing balance support: Bilateral upper extremity supported;During functional activity Standing balance-Leahy Scale: Poor Standing balance comment: Stands for brief period before needing to sit due to fatigue. Does not seems to use UEs effectively to support LEs.  Cognition Arousal/Alertness: Awake/alert Behavior During Therapy: WFL for tasks assessed/performed Overall Cognitive Status: No family/caregiver present to determine baseline  cognitive functioning                                 General Comments: Increased time for task processing and maximal encouragemnt for motivation. Patient not verbal at all initially during session. Became more verbal as session progressed.        Exercises Total Joint Exercises Ankle Circles/Pumps: PROM;Both;10 reps    General Comments        Pertinent Vitals/Pain Pain Assessment: Faces Faces Pain Scale: Hurts a little bit Breathing: normal Negative Vocalization: none Facial Expression: smiling or inexpressive Body Language: relaxed Consolability: no need to console PAINAD Score: 0 Pain Location: Said "ow" when feet pushed up against end of bed, but unable to verbalize what hurt. Pain Intervention(s): Monitored during session    Home Living                          Prior Function            PT Goals (current goals can now be found in the care plan section) Acute Rehab PT Goals Patient Stated Goal: " get better." PT Goal Formulation: With patient Time For Goal Achievement: 07/20/21 Potential to Achieve Goals: Fair Progress towards PT goals: Progressing toward goals    Frequency    Min 2X/week      PT Plan Current plan remains appropriate    Co-evaluation PT/OT/SLP Co-Evaluation/Treatment: Yes Reason for Co-Treatment: For patient/therapist safety;To address functional/ADL transfers PT goals addressed during session: Mobility/safety with mobility;Balance        AM-PAC PT "6 Clicks" Mobility   Outcome Measure  Help needed turning from your back to your side while in a flat bed without using bedrails?: A Lot Help needed moving from lying on your back to sitting on the side of a flat bed without using bedrails?: A Lot Help needed moving to and from a bed to a chair (including a wheelchair)?: Total Help needed standing up from a chair using your arms (e.g., wheelchair or bedside chair)?: A Lot Help needed to walk in hospital room?:  Total Help needed climbing 3-5 steps with a railing? : Total 6 Click Score: 9    End of Session Equipment Utilized During Treatment: Gait belt Activity Tolerance: Patient limited by fatigue Patient left: in bed;with call bell/phone within reach Nurse Communication: Mobility status PT Visit Diagnosis: Other abnormalities of gait and mobility (R26.89);Muscle weakness (generalized) (M62.81);Difficulty in walking, not elsewhere classified (R26.2)     Time: 8563-1497 PT Time Calculation (min) (ACUTE ONLY): 27 min  Charges:  $Therapeutic Activity: 8-22 mins                     Deyvi Bonanno, PT, GCS 07/04/21,10:50 AM

## 2021-07-04 NOTE — Evaluation (Signed)
Occupational Therapy Re-Evaluation Patient Details Name: Desiree Mitchell MRN: [id number] DOB: October 29, 1955 Today's Date: 07/04/2021   History of Present Illness Pt is a 65 y.o. female presenting to hospital 05/21/21 with AMS (found unresponsive by family).  Significant pressure sore present back of neck, buttocks, and coccyx.  Pt noted to be infested with bed bugs and roaches.  Pt admitted with acute respiratory failure, rhabdomyolysis, metabolic acidosis, acute kidney injury, sepsis, VA shunt malfunction, transaminitis, and posterior neck/buttocks/coccyx pressure injuries.  S/p removal ventriculoatrial shunt and placement of L parietal approach ventriculostomy 05/22/21; extubated 10/11; MRI brain 10/14 showing few scattered small acute infarcts involving different vascular territories (watershed type infarcts); TEE 10/18; and EVD removed 10/19.    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   Clinical Impression   Pt seen for OT re-evaluation (co-tx with PT to maximize services d/t decreased fxl activity tolerance) to f/u re: safety with ADLs/ADL mobility. Pt requires MOD A to come to sitting and MOD A +2 to CTS x3 trials with RW and cues for safety/sequencing. Pt only tolerates standing in short <10 second bouts and is noted to have much of her weight still positioned posteriorly (posterior aspect of BLE appears to be bracing on bed for balance and not much weight through her forefoot). In sitting, pt requires SETUP for UB dressing. Because she has met this OT goal and is close to needing her POC updated, OT went ahead and re-assessed to upgrade goals based on progress. Some goals continued to allow more time, but pt overall progressing. Pt returned to bed end of session with all needs met and in reach. Will continue to follow.      Recommendations for follow up therapy are one component of a multi-disciplinary discharge planning  process, led by the attending physician.  Recommendations may be updated based on patient status, additional functional criteria and insurance authorization.   Follow Up Recommendations  Skilled nursing-short term rehab (<3 hours/day)    Assistance Recommended at Discharge Frequent or constant Supervision/Assistance  Functional Status Assessment  Patient has had a recent decline in their functional status and demonstrates the ability to make significant improvements in function in a reasonable and predictable amount of time.  Equipment Recommendations  Other (comment) (defer to next level of care)    Recommendations for Other Services       Precautions / Restrictions Precautions Precautions: Fall Precaution Comments: Aspiration; R chest port;  buttocks pressure injuries Restrictions Weight Bearing Restrictions: No      Mobility Bed Mobility Overal bed mobility: Needs Assistance Bed Mobility: Supine to Sit;Sit to Supine     Supine to sit: Mod assist;HOB elevated Sit to supine: Min assist   General bed mobility comments: Patient assisted with bed mobility this session. Requires cues to initiate movement, but is able to assist more than on prior visits.    Transfers Overall transfer level: Needs assistance Equipment used: Rolling walker (2 wheels) Transfers: Sit to/from Stand Sit to Stand: Mod assist;+2 physical assistance          Lateral/Scoot Transfers: Max assist;+2 physical assistance General transfer comment: STS x 3 reps with mod +2 assist. Cues to sequence/contribute safely      Balance Overall balance assessment: Needs assistance Sitting-balance support: Feet supported;Bilateral upper extremity supported Sitting balance-Leahy Scale: Good Sitting balance - Comments: patient able to maintain sitting balance without difficulty this session Postural control: Posterior lean Standing balance support: Bilateral  upper extremity supported;During functional  activity Standing balance-Leahy Scale: Poor Standing balance comment: only tolerates static stand with LEs seemingly baced posteriorly on bed, for <10 second bouts                           ADL either performed or assessed with clinical judgement   ADL Overall ADL's : Needs assistance/impaired                 Upper Body Dressing : Sitting;Moderate assistance Upper Body Dressing Details (indicate cue type and reason): to don hospital gown                         Vision Patient Visual Report: No change from baseline Additional Comments: still somewhat difficult to formally assess, she tracks mostly appropriately.     Perception     Praxis      Pertinent Vitals/Pain Pain Assessment: Faces Faces Pain Scale: Hurts a little bit Pain Location: Said "ow" when feet pushed up against end of bed, but unable to verbalize what hurt. Pain Descriptors / Indicators: Discomfort;Grimacing Pain Intervention(s): Monitored during session;Repositioned     Hand Dominance     Extremity/Trunk Assessment Upper Extremity Assessment Upper Extremity Assessment: Generalized weakness   Lower Extremity Assessment Lower Extremity Assessment: Generalized weakness       Communication Communication Communication: Expressive difficulties (voice is hoarse, also inconsistent attn to task)   Cognition Arousal/Alertness: Awake/alert Behavior During Therapy: WFL for tasks assessed/performed Overall Cognitive Status: No family/caregiver present to determine baseline cognitive functioning                                 General Comments: limited conversationally, she can somewhat appropriately answer yes/no. Oriented to self only. Continues to make some concerning statements regarding daughter to the effect of "she shows up when it's payday"     General Comments       Exercises Other Exercises Other Exercises: OT engages pt in seated postural extension/scapular  retraction x10 to improve surface area for quality breathing and strengthen postural muscles for balance.   Shoulder Instructions      Home Living Family/patient expects to be discharged to:: Private residence Living Arrangements: Alone Available Help at Discharge: Family;Available PRN/intermittently (dtr tanya lives right next door.) Type of Home: House       Home Layout: Two level                   Additional Comments: Pt able to provide minimal home set- up information partially d/t confusion/cognition, partially d/t hoarsness of voice      Prior Functioning/Environment                          OT Problem List: Decreased strength;Decreased range of motion;Decreased activity tolerance;Impaired balance (sitting and/or standing);Decreased cognition;Decreased safety awareness;Decreased knowledge of use of DME or AE;Cardiopulmonary status limiting activity;Impaired tone;Obesity;Impaired UE functional use;Increased edema      OT Treatment/Interventions: Self-care/ADL training;Therapeutic exercise;Neuromuscular education;DME and/or AE instruction;Manual therapy;Therapeutic activities;Cognitive remediation/compensation;Patient/family education;Balance training    OT Goals(Current goals can be found in the care plan section) Acute Rehab OT Goals OT Goal Formulation: Patient unable to participate in goal setting Time For Goal Achievement: 08/03/21 Potential to Achieve Goals: Fair ADL Goals Pt Will Perform Grooming: with set-up;sitting (unsupported sitting EOB) Pt Will Perform  Upper Body Dressing: with set-up;sitting Pt Will Transfer to Toilet: with min assist;stand pivot transfer;bedside commode Pt/caregiver will Perform Home Exercise Program: Increased strength;Both right and left upper extremity;With minimal assist (and trunk) Additional ADL Goal #1: Pt will tolerate 10 mins of graded seated (with G balance unsupported) ADLs with <10% verbal cues to attend/sequence.   OT Frequency: Min 2X/week   Barriers to D/C: Decreased caregiver support          Co-evaluation PT/OT/SLP Co-Evaluation/Treatment: Yes Reason for Co-Treatment: Complexity of the patient's impairments (multi-system involvement);For patient/therapist safety;To address functional/ADL transfers PT goals addressed during session: Mobility/safety with mobility OT goals addressed during session: ADL's and self-care;Strengthening/ROM      AM-PAC OT "6 Clicks" Daily Activity     Outcome Measure Help from another person eating meals?: A Little Help from another person taking care of personal grooming?: A Little Help from another person toileting, which includes using toliet, bedpan, or urinal?: Total Help from another person bathing (including washing, rinsing, drying)?: A Lot Help from another person to put on and taking off regular upper body clothing?: A Lot Help from another person to put on and taking off regular lower body clothing?: Total 6 Click Score: 12   End of Session Equipment Utilized During Treatment: Rolling walker (2 wheels);Gait belt Nurse Communication: Mobility status  Activity Tolerance: Patient limited by fatigue;Other (comment) Patient left: in bed;with call bell/phone within reach;with bed alarm set  OT Visit Diagnosis: Muscle weakness (generalized) (M62.81);Adult, failure to thrive (R62.7);Other symptoms and signs involving cognitive function                Time: 4136-4383 OT Time Calculation (min): 27 min Charges:  OT General Charges $OT Visit: 1 Visit OT Evaluation $OT Re-eval: 1 Re-eval OT Treatments $Self Care/Home Management : 8-22 mins  Gerrianne Scale, MS, OTR/L ascom [phone] 07/04/21, 3:18 PM

## 2021-07-04 NOTE — Progress Notes (Addendum)
Correction to A&P:  Stage 2 pressure injury to the right ischial tuberosity with a small area of fibrinous tissue without evidence of infection/necrosis and a stage I pressure injury to the left ischial tuberosity without infection//necrosis

## 2021-07-05 DIAGNOSIS — G9340 Encephalopathy, unspecified: Secondary | ICD-10-CM | POA: Diagnosis not present

## 2021-07-05 DIAGNOSIS — I639 Cerebral infarction, unspecified: Secondary | ICD-10-CM | POA: Diagnosis not present

## 2021-07-05 DIAGNOSIS — A419 Sepsis, unspecified organism: Secondary | ICD-10-CM | POA: Diagnosis not present

## 2021-07-05 DIAGNOSIS — N179 Acute kidney failure, unspecified: Secondary | ICD-10-CM | POA: Diagnosis not present

## 2021-07-05 NOTE — Progress Notes (Signed)
PROGRESS NOTE    Desiree Mitchell  NWG:[id number] DOB: 11/02/1955 DOA: 05/21/2021 PCP: Pcp, No    Brief Narrative:  Patient is a 65 year old female with history of breast cancer status post bilateral mastectomy, history of VA shunt  and prior subarachnoid hemorrhage/hydrocephalus, CVA with residual right-sided deficit, asthma, hypertension who presented at emergency department after she was found unresponsive by family.  On presentation, she was hypoxic, hypotensive.  She was also found to be infested with bedbugs and cockroaches.  CT head without contrast showed development of mild hydrocephalus, periventricular edema associated with malpositioned left VA shunt.  She was seen by neurosurgery and was urgently  taken to the OR for removal of her VA shunt and placement of external ventricular drain.  Hospital course also remarkable for bacteremia for which ID was following.  Patient was transferred to [facility] on 05/30/2021.  Several discussions were held with family about goals of care.  Initially she was on comfort care but since her mental status slowly improved, family wanted to discontinue comfort care,then family decided on placing her in a skilled nursing facility.   Hospice agency was also following and recommending hospice support on long-term care.  Since last several days, she has remained hemodynamically stable, overall comfortable.  Medically stable for discharge to SNF whenever possible.TOC following  11/24 no overnight issues   Assessment & Plan:   Principal Problem:   Sepsis ([place]) Active Problems:   Endotracheally intubated   On mechanically assisted ventilation (HCC)   Shunt malfunction   Rhabdomyolysis   AKI (acute kidney injury) ([place])   Increased anion gap metabolic acidosis   Acute encephalopathy   Transaminitis   Pressure injury of skin   Acute embolic stroke ([place])   Palliative care by specialist   DNR (do not resuscitate)   Pressure injury of left buttock, stage 4  ([place])  New UTI. Severe sepsis/Proteus/staph epidermidis septicemia: secondary to wounds on left buttock Left buttock stage 4 pressure ulcer.   Patient has completed all antibiotics.  Reexam patient left buttock wound, the wound is very deep.   11/23 gsx input was appreciated.  Left gluteal wound is fibrinous material and was debrided at bedside, otherwise has healthy granulation tissue and no evidence of infection.  Right gluteal wound is also healthy with good granulation tissue.  For now General surgery recommends Santyl on the left wound to continue helping with cleaning the fibrinous material.  Can do wet-to-dry saline gauze dressing changes on the right wound.  11/24 no surgical need at this General surgery signed off    Depression. Was started on citalopram      Postop acute hypoxemic respiratory failure.   VP shunt malfunction. 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 History of stroke. Stable improved   B12 deficiency. Continue oral supplement    Dysphagia. Continue dysphagia diet       DVT prophylaxis: SCDs Code Status: DNR Family Communication: None at bedside Disposition Plan:      Status is: Inpatient   Remains inpatient appropriate because: Unsafe discharge plan,         I/O last 3 completed shifts: In: 120 [P.O.:120] Out: 1200 [Urine:1200] No intake/output data recorded.     Consultants:  General surgery  Procedures: None  Antimicrobials: None  Subjective: No shortness of breath, chest pain, or dizziness  Objective: Vitals:   07/04/21 2036 07/05/21 0500 07/05/21 0608 07/05/21 1240  BP: (!) 149/73  (!) 163/76 140/80  Pulse: 73  83 75  Resp:  16   17  Temp: 98.3 F (36.8 C)  98.2 F (36.8 C) 98.2 F (36.8 C)  TempSrc: Oral  Oral   SpO2: 95%  95% 98%  Weight:  100 kg    Height:        Intake/Output Summary (Last 24 hours) at 07/05/2021 1410 Last data filed at 07/05/2021 0608 Gross per 24 hour   Intake --  Output 500 ml  Net -500 ml   Filed Weights   07/01/21 0442 07/02/21 0500 07/05/21 0500  Weight: 102.7 kg 101.5 kg 100 kg    Examination: Calm, NAD CTA no wheeze Regular S1-S2 no gallops Soft benign positive bowel sounds No edema Mood and affect appropriate in current setting   Data Reviewed: I have personally reviewed following labs and imaging studies  CBC: No results for input(s): WBC, NEUTROABS, HGB, HCT, MCV, PLT in the last 168 hours. Basic Metabolic Panel: No results for input(s): NA, K, CL, CO2, GLUCOSE, BUN, CREATININE, CALCIUM, MG, PHOS in the last 168 hours. GFR: Estimated Creatinine Clearance: 75.9 mL/min (by C-G formula based on SCr of 0.84 mg/dL). Liver Function Tests: No results for input(s): AST, ALT, ALKPHOS, BILITOT, PROT, ALBUMIN in the last 168 hours. No results for input(s): LIPASE, AMYLASE in the last 168 hours. No results for input(s): AMMONIA in the last 168 hours. Coagulation Profile: No results for input(s): INR, PROTIME in the last 168 hours. Cardiac Enzymes: No results for input(s): CKTOTAL, CKMB, CKMBINDEX, TROPONINI in the last 168 hours. BNP (last 3 results) No results for input(s): PROBNP in the last 8760 hours. HbA1C: No results for input(s): HGBA1C in the last 72 hours. CBG: No results for input(s): GLUCAP in the last 168 hours. Lipid Profile: No results for input(s): CHOL, HDL, LDLCALC, TRIG, CHOLHDL, LDLDIRECT in the last 72 hours. Thyroid Function Tests: No results for input(s): TSH, T4TOTAL, FREET4, T3FREE, THYROIDAB in the last 72 hours. Anemia Panel: No results for input(s): VITAMINB12, FOLATE, FERRITIN, TIBC, IRON, RETICCTPCT in the last 72 hours. Sepsis Labs: No results for input(s): PROCALCITON, LATICACIDVEN in the last 168 hours.  No results found for this or any previous visit (from the past 240 hour(s)).        Radiology Studies: No results found.      Scheduled Meds:  amLODipine  10 mg Oral  Daily   aspirin EC  81 mg Oral Daily   citalopram  10 mg Oral Daily   collagenase   Topical Daily   docusate sodium  100 mg Oral BID   feeding supplement (NEPRO CARB STEADY)  237 mL Oral TID BM   mouth rinse  15 mL Mouth Rinse BID   metoprolol tartrate  25 mg Oral BID   multivitamin with minerals  1 tablet Oral Daily   pantoprazole  40 mg Oral Daily   senna-docusate  1 tablet Oral BID   sodium hypochlorite   Irrigation Q2000   vitamin B-12  1,000 mcg Oral Daily   Continuous Infusions:   LOS: 44 days    Time spent: 20 min with >50% on coc    Nolberto Hanlon, MD [facility]   To contact the attending provider between 7A-7P or the covering provider during after hours 7P-7A, please log into the web site [email or web address] and access using universal [place] password for that web site. If you do not have the password, please call the hospital operator.  07/05/2021, 2:10 PM

## 2021-07-06 DIAGNOSIS — G9340 Encephalopathy, unspecified: Secondary | ICD-10-CM | POA: Diagnosis not present

## 2021-07-06 DIAGNOSIS — I639 Cerebral infarction, unspecified: Secondary | ICD-10-CM | POA: Diagnosis not present

## 2021-07-06 DIAGNOSIS — A419 Sepsis, unspecified organism: Secondary | ICD-10-CM | POA: Diagnosis not present

## 2021-07-06 DIAGNOSIS — J9601 Acute respiratory failure with hypoxia: Secondary | ICD-10-CM | POA: Diagnosis not present

## 2021-07-06 NOTE — Progress Notes (Signed)
PROGRESS NOTE    Desiree Mitchell  MPN:[id number] DOB: 1956-07-07 DOA: 05/21/2021 PCP: Pcp, No    Brief Narrative:  Patient is a 65 year old female with history of breast cancer status post bilateral mastectomy, history of VA shunt  and prior subarachnoid hemorrhage/hydrocephalus, CVA with residual right-sided deficit, asthma, hypertension who presented at emergency department after she was found unresponsive by family.  On presentation, she was hypoxic, hypotensive.  She was also found to be infested with bedbugs and cockroaches.  CT head without contrast showed development of mild hydrocephalus, periventricular edema associated with malpositioned left VA shunt.  She was seen by neurosurgery and was urgently  taken to the OR for removal of her VA shunt and placement of external ventricular drain.  Hospital course also remarkable for bacteremia for which ID was following.  Patient was transferred to [facility] on 05/30/2021.  Several discussions were held with family about goals of care.  Initially she was on comfort care but since her mental status slowly improved, family wanted to discontinue comfort care,then family decided on placing her in a skilled nursing facility.   Hospice agency was also following and recommending hospice support on long-term care.  Since last several days, she has remained hemodynamically stable, overall comfortable.  Medically stable for discharge to SNF whenever possible.TOC following  11/25 no overnight issues   Assessment & Plan:   Principal Problem:   Sepsis ([place]) Active Problems:   Endotracheally intubated   On mechanically assisted ventilation (HCC)   Shunt malfunction   Rhabdomyolysis   AKI (acute kidney injury) ([place])   Increased anion gap metabolic acidosis   Acute encephalopathy   Transaminitis   Pressure injury of skin   Acute embolic stroke ([place])   Palliative care by specialist   DNR (do not resuscitate)   Pressure injury of left buttock, stage 4  ([place])  New UTI. Severe sepsis/Proteus/staph epidermidis septicemia: secondary to wounds on left buttock Left buttock stage 4 pressure ulcer.   Patient has completed all antibiotics.  Reexam patient left buttock wound, the wound is very deep.   11/23 gsx input was appreciated.  Left gluteal wound is fibrinous material and was debrided at bedside, otherwise has healthy granulation tissue and no evidence of infection.  Right gluteal wound is also healthy with good granulation tissue.  For now General surgery recommends Santyl on the left wound to continue helping with cleaning the fibrinous material.  Can do wet-to-dry saline gauze dressing changes on the right wound.  11/25 no surgical needs General surgery signed off      Depression. Was started on citalopram       Postop acute hypoxemic respiratory failure.   VP shunt malfunction. 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 History of stroke. Stable improved   B12 deficiency. Continue oral replacement    Dysphagia. Continue dysphagia diet       DVT prophylaxis: SCDs Code Status: DNR Family Communication: None at bedside Disposition Plan:      Status is: Inpatient   Remains inpatient appropriate because: Unsafe discharge plan,         I/O last 3 completed shifts: In: 120 [P.O.:120] Out: 300 [Urine:300] No intake/output data recorded.     Consultants:  General surgery  Procedures: None  Antimicrobials: None  Subjective: No shortness of breath, chest pain, or any other symptoms  Objective: Vitals:   07/05/21 2003 07/05/21 2331 07/06/21 0442 07/06/21 0854  BP: 129/65 138/77 135/75 103/71  Pulse: 89 85 78 89  Resp: 16 18 18 18   Temp: 98.7 F (37.1 C) 98.9 F (37.2 C) 99.4 F (37.4 C) 98.1 F (36.7 C)  TempSrc: Oral Oral Oral Oral  SpO2: 92% 92% 93% 93%  Weight:      Height:        Intake/Output Summary (Last 24 hours) at 07/06/2021 0854 Last data filed at  07/05/2021 1858 Gross per 24 hour  Intake 120 ml  Output --  Net 120 ml   Filed Weights   07/01/21 0442 07/02/21 0500 07/05/21 0500  Weight: 102.7 kg 101.5 kg 100 kg    Examination: Calm, NAD CTA no wheeze Regular S1-S2 no gallops Soft benign positive bowel sounds No edema Mood and affect appropriate in current setting   Data Reviewed: I have personally reviewed following labs and imaging studies  CBC: No results for input(s): WBC, NEUTROABS, HGB, HCT, MCV, PLT in the last 168 hours. Basic Metabolic Panel: No results for input(s): NA, K, CL, CO2, GLUCOSE, BUN, CREATININE, CALCIUM, MG, PHOS in the last 168 hours. GFR: Estimated Creatinine Clearance: 75.9 mL/min (by C-G formula based on SCr of 0.84 mg/dL). Liver Function Tests: No results for input(s): AST, ALT, ALKPHOS, BILITOT, PROT, ALBUMIN in the last 168 hours. No results for input(s): LIPASE, AMYLASE in the last 168 hours. No results for input(s): AMMONIA in the last 168 hours. Coagulation Profile: No results for input(s): INR, PROTIME in the last 168 hours. Cardiac Enzymes: No results for input(s): CKTOTAL, CKMB, CKMBINDEX, TROPONINI in the last 168 hours. BNP (last 3 results) No results for input(s): PROBNP in the last 8760 hours. HbA1C: No results for input(s): HGBA1C in the last 72 hours. CBG: No results for input(s): GLUCAP in the last 168 hours. Lipid Profile: No results for input(s): CHOL, HDL, LDLCALC, TRIG, CHOLHDL, LDLDIRECT in the last 72 hours. Thyroid Function Tests: No results for input(s): TSH, T4TOTAL, FREET4, T3FREE, THYROIDAB in the last 72 hours. Anemia Panel: No results for input(s): VITAMINB12, FOLATE, FERRITIN, TIBC, IRON, RETICCTPCT in the last 72 hours. Sepsis Labs: No results for input(s): PROCALCITON, LATICACIDVEN in the last 168 hours.  No results found for this or any previous visit (from the past 240 hour(s)).        Radiology Studies: No results found.      Scheduled  Meds:  amLODipine  10 mg Oral Daily   aspirin EC  81 mg Oral Daily   citalopram  10 mg Oral Daily   collagenase   Topical Daily   docusate sodium  100 mg Oral BID   feeding supplement (NEPRO CARB STEADY)  237 mL Oral TID BM   mouth rinse  15 mL Mouth Rinse BID   metoprolol tartrate  25 mg Oral BID   multivitamin with minerals  1 tablet Oral Daily   pantoprazole  40 mg Oral Daily   senna-docusate  1 tablet Oral BID   sodium hypochlorite   Irrigation Q2000   vitamin B-12  1,000 mcg Oral Daily   Continuous Infusions:   LOS: 45 days    Time spent: 20 min with >50% on coc    Nolberto Hanlon, MD [facility]   To contact the attending provider between 7A-7P or the covering provider during after hours 7P-7A, please log into the web site [email or web address] and access using universal [place] password for that web site. If you do not have the password, please call the hospital operator.  07/06/2021, 8:54 AM

## 2021-07-06 NOTE — Progress Notes (Signed)
Physical Therapy Treatment Patient Details Name: Desiree Mitchell MRN: [id number] DOB: Jan 30, 1956 Today's Date: 07/06/2021   History of Present Illness Pt is a 65 y.o. female presenting to hospital 05/21/21 with AMS (found unresponsive by family).  Significant pressure sore present back of neck, buttocks, and coccyx.  Pt noted to be infested with bed bugs and roaches.  Pt admitted with acute respiratory failure, rhabdomyolysis, metabolic acidosis, acute kidney injury, sepsis, VA shunt malfunction, transaminitis, and posterior neck/buttocks/coccyx pressure injuries.  S/p removal ventriculoatrial shunt and placement of L parietal approach ventriculostomy 05/22/21; extubated 10/11; MRI brain 10/14 showing few scattered small acute infarcts involving different vascular territories (watershed type infarcts); TEE 10/18; and EVD removed 10/19.    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    PT Comments    Pt is making limited progress towards goals and has urgent need to toilet upon arrival. Brought [facility] out however unable to get bed rails down and unable to lower bed closer to floor to enable OOB. 2 additional staff brought in to assist with bed. Able to roll and place on bed pan and BM and performed rolling to hygiene. Does complain of pain with L knee. Will continue to progress.  Recommendations for follow up therapy are one component of a multi-disciplinary discharge planning process, led by the attending physician.  Recommendations may be updated based on patient status, additional functional criteria and insurance authorization.  Follow Up Recommendations  Skilled nursing-short term rehab (<3 hours/day)     Assistance Recommended at Discharge Frequent or constant Supervision/Assistance  Equipment Recommendations  None recommended by PT    Recommendations for Other Services       Precautions / Restrictions  Precautions Precautions: Fall Restrictions Weight Bearing Restrictions: No     Mobility  Bed Mobility Overal bed mobility: Needs Assistance Bed Mobility: Rolling Rolling: Max assist         General bed mobility comments: performed rolling for bed pan placement as this therapist unable to get bed rails down in time to attempt mobility    Transfers                   General transfer comment: unable to perform due to 3 staff unable to work bed    Ambulation/Gait                   Stairs             Wheelchair Mobility    Modified Rankin (Stroke Patients Only)       Balance                                            Cognition Arousal/Alertness: Awake/alert Behavior During Therapy: WFL for tasks assessed/performed Overall Cognitive Status: No family/caregiver present to determine baseline cognitive functioning                                 General Comments: alert to self        Exercises Other Exercises Other Exercises: supine ther-ex performed on B LE including hip abd/add, SLRs, and attempted knee flexion (painful on L side) 5-8 reps Other Exercises: rolling x B side for bed pan with +BM. Needs total assist for hygiene  General Comments        Pertinent Vitals/Pain Pain Assessment: Faces Faces Pain Scale: Hurts whole lot Pain Location: L knee with any movement Pain Descriptors / Indicators: Discomfort;Grimacing Pain Intervention(s): Limited activity within patient's tolerance;Repositioned    Home Living                          Prior Function            PT Goals (current goals can now be found in the care plan section) Acute Rehab PT Goals Patient Stated Goal: " get better." PT Goal Formulation: With patient Time For Goal Achievement: 07/20/21 Potential to Achieve Goals: Fair Progress towards PT goals: Progressing toward goals    Frequency    Min 2X/week      PT  Plan Current plan remains appropriate    Co-evaluation              AM-PAC PT "6 Clicks" Mobility   Outcome Measure  Help needed turning from your back to your side while in a flat bed without using bedrails?: A Lot Help needed moving from lying on your back to sitting on the side of a flat bed without using bedrails?: A Lot Help needed moving to and from a bed to a chair (including a wheelchair)?: Total Help needed standing up from a chair using your arms (e.g., wheelchair or bedside chair)?: A Lot Help needed to walk in hospital room?: Total Help needed climbing 3-5 steps with a railing? : Total 6 Click Score: 9    End of Session   Activity Tolerance: Patient limited by fatigue Patient left: in bed;with call bell/phone within reach Nurse Communication: Mobility status PT Visit Diagnosis: Other abnormalities of gait and mobility (R26.89);Muscle weakness (generalized) (M62.81);Difficulty in walking, not elsewhere classified (R26.2)     Time: 4128-7867 PT Time Calculation (min) (ACUTE ONLY): 35 min  Charges:  $Therapeutic Exercise: 8-22 mins $Therapeutic Activity: 8-22 mins                     Desiree Mitchell, PT, DPT [phone]    Desiree Mitchell 07/06/2021, 12:18 PM

## 2021-07-06 NOTE — TOC Progression Note (Signed)
Transition of Care [facility]) - Progression Note    Patient Details  Name: Vianka Ertel MRN: [id number] Date of Birth: 19-Aug-1955  Transition of Care [facility] CM/SW Contact  Shelbie Hutching, RN Phone Number: 07/06/2021, 1:23 PM  Clinical Narrative:    Yates Decamp to check on auth and Jackelyn Poling reports that she called [employer] and is waiting to hear back from them.    Expected Discharge Plan: Skilled Nursing Facility Barriers to Discharge: Insurance Authorization  Expected Discharge Plan and Services Expected Discharge Plan: [place] In-house Referral: Clinical Social Work Discharge Planning Services: CM Consult Post Acute Care Choice: [place] Living arrangements for the past 2 months: Single Family Home                 DME Arranged: N/A DME Agency: NA       HH Arranged: NA HH Agency: NA         Social Determinants of Health (SDOH) Interventions    Readmission Risk Interventions No flowsheet data found.

## 2021-07-07 DIAGNOSIS — G9340 Encephalopathy, unspecified: Secondary | ICD-10-CM | POA: Diagnosis not present

## 2021-07-07 DIAGNOSIS — I639 Cerebral infarction, unspecified: Secondary | ICD-10-CM | POA: Diagnosis not present

## 2021-07-07 LAB — COMPREHENSIVE METABOLIC PANEL
ALT: 18 U/L (ref 0–44)
AST: 17 U/L (ref 15–41)
Albumin: 3.1 g/dL — ABNORMAL LOW (ref 3.5–5.0)
Alkaline Phosphatase: 55 U/L (ref 38–126)
Anion gap: 8 (ref 5–15)
BUN: 27 mg/dL — ABNORMAL HIGH (ref 8–23)
CO2: 27 mmol/L (ref 22–32)
Calcium: 8.8 mg/dL — ABNORMAL LOW (ref 8.9–10.3)
Chloride: 107 mmol/L (ref 98–111)
Creatinine, Ser: 0.84 mg/dL (ref 0.44–1.00)
GFR, Estimated: >60 mL/min (ref >60)
Glucose, Bld: 105 mg/dL — ABNORMAL HIGH (ref 70–99)
Potassium: 3.9 mmol/L (ref 3.5–5.1)
Sodium: 142 mmol/L (ref 135–145)
Total Bilirubin: 0.5 mg/dL (ref 0.3–1.2)
Total Protein: 6.7 g/dL (ref 6.5–8.1)

## 2021-07-07 LAB — CBC
HCT: 41.8 % (ref 36.0–46.0)
Hemoglobin: 12.9 g/dL (ref 12.0–15.0)
MCH: 30.3 pg (ref 26.0–34.0)
MCHC: 30.9 g/dL (ref 30.0–36.0)
MCV: 98.1 fL (ref 80.0–100.0)
Platelets: 409 10*3/uL — ABNORMAL HIGH (ref 150–400)
RBC: 4.26 MIL/uL (ref 3.87–5.11)
RDW: 15 % (ref 11.5–15.5)
WBC: 10.9 10*3/uL — ABNORMAL HIGH (ref 4.0–10.5)
nRBC: 0 % (ref 0.0–0.2)

## 2021-07-07 MED ORDER — DAKINS (1/4 STRENGTH) 0.125 % EX SOLN
Freq: Every day | CUTANEOUS | Status: DC
  Filled 2021-07-07: qty 473

## 2021-07-07 NOTE — Progress Notes (Addendum)
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  Previously she was last seen normal 10/8.  Initial work-up with a CT of the head showed periventricular edema associated with left VA shunt malposition.  Neurosurgery took patient to the OR emergently on 10/11 with complete removal and of the ventriculoatrial shunt and placement of the left parietal approach ventriculostomy.  The external ventriculostomy drain eventually was removed on 05/30/2021.  This is remained stable since.  Hospital course was also complicated by acute hypoxic respiratory failure requiring ICU stay/intubation now weaned off to room air.  She also developed on 11/14 multifocal infarcts for which neurology evaluated patient.  She also was dysphagia at 1 point and a PEG tube was considered but now eating on her own.  Overall she appears stable, awaiting placement  Subjective / 24h Interval events: Doing well today, no complaints.  Watching TV.  Appears appropriate   Assessment & Plan: Principal Problem Severe sepsis, Proteus, staph epididymitis bacteremia -early in the hospital course, ID consulted and followed patient while hospitalized.  Etiology was suspected to be from chronic wounds.  TTE was unrevealing, a TEE could not be performed due to hemodynamic abnormalities of her epiglottitis.  She has completed antibiotics ceftriaxone and linezolid, ID signed off and currently she is being monitored off antibiotics.  Remains afebrile.  Active Problems Left buttock stage IV pressure ulcer-General surgery consulted, followed patient while hospitalized.  She underwent bedside  debridement otherwise has healthy granulation tissue and no evidence of infection.  General surgery recommends Santyl on the left wound to continue help cleaning with fibrinous material, wet-to-dry saline gauze dressing.  General surgery signed off  VP shunt malfunction, history of aneurysm, prior CVA-she presented with confusion, a CT scan on admission showed development of periventricular edema associated with left VA shunt malposition.  Neurosurgery to go to the OR, shunt was removed and she had an EVD placed which was eventually removed 05/30/2021.  CSF cultures remain negative.  No further interventions per neurosurgical team  Multifocal strokes-during his hospital stay she underwent an MRI which showed multifocal infarcts concerning to be embolic in nature.  Neurology consulted and followed patient while hospitalized.  TTE was unrevealing.  Continue aspirin currently.  Acute hypoxic respiratory failure, ventilatory dependent respiratory failure-initially on admission, eventually liberated from ventilation and currently on room air    Hypertension -Continue Norvasc, metoprolol   Low normal vitamin B12-Received 3 days of IM vitamin B12 supplementation. Continue oral supplementation.  Acute kidney injury-creatinine 2.8 on admission, now normalized  Dysphagia -initially declined PEG tube placement.  NG tube has been removed, tube feeding discontinued.  Continue comfort feeds.  Speech therapy following. On dysphagia 2 diet.   GERD-Continue PPI   Normocytic anemia-Continue to monitor H&H.  She was transfused with a unit of PRBC on 11/2.  Hemoglobin today stable.  Depression-started on citalopram  Goals of care-Palliative care following.  Initially she was on comfort care which has been discontinued because her oral status improved.  Case manager is following for placing her on skilled nursing facility.    Scheduled Meds:  amLODipine  10 mg Oral Daily   aspirin EC  81 mg Oral Daily    citalopram  10 mg Oral Daily   collagenase   Topical Daily   docusate sodium  100 mg Oral BID   feeding supplement (NEPRO CARB STEADY)  237 mL Oral TID BM   mouth rinse  15 mL Mouth Rinse BID   metoprolol tartrate  25 mg Oral BID   multivitamin with minerals  1 tablet Oral Daily   pantoprazole  40 mg Oral Daily   senna-docusate  1 tablet Oral BID   sodium hypochlorite   Irrigation Q2000   vitamin B-12  1,000 mcg Oral Daily   Continuous Infusions: PRN Meds:.acetaminophen **OR** acetaminophen, albuterol, ondansetron **OR** [DISCONTINUED] ondansetron (ZOFRAN) IV, polyvinyl alcohol  Diet Orders (From admission, onward)     Start     Ordered   06/12/21 1547  DIET DYS 2 Room service appropriate? Yes with Assist; Fluid consistency: Nectar Thick  Diet effective now       Comments: NO STRAWS!  Extra gravy on MINCED meats, potatoes.  Question Answer Comment  Room service appropriate? Yes with Assist   Fluid consistency: Nectar Thick      06/12/21 1548            DVT prophylaxis: Place and maintain sequential compression device Start: 06/21/21 1303 Place and maintain sequential compression device Start: 06/16/21 1101     Code Status: DNR  Family Communication: no family at bedisde  Status is: Inpatient  Remains inpatient appropriate because: Awaiting placement  Level of care: Med-Surg  Consultants:  Neurosurgery Neurology General surgery Palliative care  Antimicrobials: None currently     Objective: Vitals:   07/06/21 2045 07/07/21 0500 07/07/21 0604 07/07/21 0837  BP: (!) 147/82  (!) 141/77 (!) 143/73  Pulse: 84  74 82  Resp: 16  16 18   Temp: 97.6 F (36.4 C)  98 F (36.7 C) 97.7 F (36.5 C)  TempSrc: Oral  Oral Oral  SpO2: 90%  92% 92%  Weight:  103 kg    Height:        Intake/Output Summary (Last 24 hours) at 07/07/2021 1015 Last data filed at 07/07/2021 0710 Gross per 24 hour  Intake 0 ml  Output 350 ml  Net -350 ml   Filed Weights   07/02/21  0500 07/05/21 0500 07/07/21 0500  Weight: 101.5 kg 100 kg 103 kg    Examination:  Constitutional: NAD Eyes: no scleral icterus ENMT: Mucous membranes are moist.  Neck: normal, supple Respiratory: clear to auscultation bilaterally, no wheezing, no crackles. Cardiovascular: Regular rate and rhythm, no murmurs / rubs / gallops. No LE edema. Abdomen: non distended, no tenderness. Bowel sounds positive.  Musculoskeletal: no clubbing / cyanosis.  Skin: no rashes Neurologic: no new focal deficits.   Data Reviewed: I have independently reviewed following labs and imaging studies  CBC: Recent Labs  Lab 07/07/21 0910  WBC 10.9*  HGB 12.9  HCT 41.8  MCV 98.1  PLT 364*   Basic Metabolic Panel: Recent Labs  Lab 07/07/21 0910  NA 142  K 3.9  CL 107  CO2 27  GLUCOSE 105*  BUN 27*  CREATININE 0.84  CALCIUM 8.8*   Liver Function Tests: Recent Labs  Lab 07/07/21 0910  AST 17  ALT 18  ALKPHOS 55  BILITOT 0.5  PROT 6.7  ALBUMIN 3.1*   Coagulation Profile: No results for input(s): INR, PROTIME in the last 168 hours. HbA1C: No results for input(s): HGBA1C in the last 72 hours. CBG: No results for input(s): GLUCAP in the last 168 hours.  No  results found for this or any previous visit (from the past 240 hour(s)).   Radiology Studies: No results found.    Marzetta Board, MD, PhD [facility]  Between 7 am - 7 pm I am available, please contact me via Amion (for emergencies) or Securechat (non urgent messages)  Between 7 pm - 7 am I am not available, please contact night coverage MD/APP via Amion

## 2021-07-08 DIAGNOSIS — G9340 Encephalopathy, unspecified: Secondary | ICD-10-CM | POA: Diagnosis not present

## 2021-07-08 DIAGNOSIS — I639 Cerebral infarction, unspecified: Secondary | ICD-10-CM | POA: Diagnosis not present

## 2021-07-08 NOTE — Progress Notes (Signed)
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  Previously she was last seen normal 10/8.  Initial work-up with a CT of the head showed periventricular edema associated with left VA shunt malposition.  Neurosurgery took patient to the OR emergently on 10/11 with complete removal and of the ventriculoatrial shunt and placement of the left parietal approach ventriculostomy.  The external ventriculostomy drain eventually was removed on 05/30/2021.  This is remained stable since.  Hospital course was also complicated by acute hypoxic respiratory failure requiring ICU stay/intubation now weaned off to room air.  She also developed on 11/14 multifocal infarcts for which neurology evaluated patient.  She also was dysphagia at 1 point and a PEG tube was considered but now eating on her own.  Overall she appears stable, awaiting placement  Subjective / 24h Interval events: No overnight events, no complaints.  Watching TV.  Assessment & Plan: Principal Problem Severe sepsis, Proteus, staph epididymitis bacteremia -early in the hospital course, ID consulted and followed patient while hospitalized.  Etiology was suspected to be from chronic wounds.  TTE was unrevealing, a TEE could not be performed due to hemodynamic abnormalities of her epiglottitis.  She has completed antibiotics ceftriaxone and linezolid, ID signed off and currently she is being monitored off antibiotics.  Remains afebrile.  Active Problems Left buttock stage IV pressure ulcer-General surgery consulted, followed patient while hospitalized.  She underwent bedside debridement otherwise  has healthy granulation tissue and no evidence of infection.  General surgery recommends Santyl on the left wound to continue help cleaning with fibrinous material, wet-to-dry saline gauze dressing.  General surgery signed off  VP shunt malfunction, history of aneurysm, prior CVA-she presented with confusion, a CT scan on admission showed development of periventricular edema associated with left VA shunt malposition.  Neurosurgery to go to the OR, shunt was removed and she had an EVD placed which was eventually removed 05/30/2021.  CSF cultures remain negative.  No further interventions per neurosurgical team  Multifocal strokes-during his hospital stay she underwent an MRI which showed multifocal infarcts concerning to be embolic in nature.  Neurology consulted and followed patient while hospitalized.  TTE was unrevealing.  Continue aspirin currently.  Acute hypoxic respiratory failure, ventilatory dependent respiratory failure-initially on admission, eventually liberated from ventilation and currently on room air    Hypertension -Continue Norvasc, metoprolol   Low normal vitamin B12-Received 3 days of IM vitamin B12 supplementation. Continue oral supplementation.  Acute kidney injury-creatinine 2.8 on admission, now normalized  Dysphagia -initially declined PEG tube placement.  NG tube has been removed, tube feeding discontinued.  Continue comfort feeds.  Speech therapy following. On dysphagia 2 diet.   GERD-Continue PPI   Normocytic anemia-Continue to monitor H&H.  She was transfused with a unit of PRBC on 11/2.  Hemoglobin today stable.  Depression-started on citalopram  Goals of care-Palliative care following.  Initially she was on comfort care which has been discontinued because her oral status improved.  Case manager is following for placing her on skilled nursing facility.    Scheduled Meds:  amLODipine  10 mg Oral Daily   aspirin EC  81 mg Oral Daily   citalopram  10 mg Oral Daily  collagenase   Topical Daily   docusate sodium  100 mg Oral BID   feeding supplement (NEPRO CARB STEADY)  237 mL Oral TID BM   mouth rinse  15 mL Mouth Rinse BID   metoprolol tartrate  25 mg Oral BID   multivitamin with minerals  1 tablet Oral Daily   pantoprazole  40 mg Oral Daily   senna-docusate  1 tablet Oral BID   sodium hypochlorite   Irrigation Q2000   vitamin B-12  1,000 mcg Oral Daily   Continuous Infusions: PRN Meds:.acetaminophen **OR** acetaminophen, albuterol, ondansetron **OR** [DISCONTINUED] ondansetron (ZOFRAN) IV, polyvinyl alcohol  Diet Orders (From admission, onward)     Start     Ordered   06/12/21 1547  DIET DYS 2 Room service appropriate? Yes with Assist; Fluid consistency: Nectar Thick  Diet effective now       Comments: NO STRAWS!  Extra gravy on MINCED meats, potatoes.  Question Answer Comment  Room service appropriate? Yes with Assist   Fluid consistency: Nectar Thick      06/12/21 1548            DVT prophylaxis: Place and maintain sequential compression device Start: 06/21/21 1303 Place and maintain sequential compression device Start: 06/16/21 1101     Code Status: DNR  Family Communication: no family at bedisde  Status is: Inpatient  Remains inpatient appropriate because: Awaiting placement  Level of care: Med-Surg  Consultants:  Neurosurgery Neurology General surgery Palliative care  Antimicrobials: None currently     Objective: Vitals:   07/07/21 2000 07/07/21 2358 07/08/21 0548 07/08/21 0735  BP: 140/63 (!) 147/76 139/74 (!) 149/68  Pulse: 71 71 71 78  Resp: 17 18 17 16   Temp: 98.6 F (37 C) 98.6 F (37 C) 97.8 F (36.6 C) 98.4 F (36.9 C)  TempSrc:  Oral  Oral  SpO2: 93% 92% 97% 91%  Weight:      Height:        Intake/Output Summary (Last 24 hours) at 07/08/2021 1038 Last data filed at 07/08/2021 1030 Gross per 24 hour  Intake 120 ml  Output 500 ml  Net -380 ml    Filed Weights   07/02/21 0500  07/05/21 0500 07/07/21 0500  Weight: 101.5 kg 100 kg 103 kg    Examination:  Constitutional: NAD Respiratory: CTA Cardiovascular: Regular rate and rhythm  Data Reviewed: I have independently reviewed following labs and imaging studies  CBC: Recent Labs  Lab 07/07/21 0910  WBC 10.9*  HGB 12.9  HCT 41.8  MCV 98.1  PLT 409*    Basic Metabolic Panel: Recent Labs  Lab 07/07/21 0910  NA 142  K 3.9  CL 107  CO2 27  GLUCOSE 105*  BUN 27*  CREATININE 0.84  CALCIUM 8.8*    Liver Function Tests: Recent Labs  Lab 07/07/21 0910  AST 17  ALT 18  ALKPHOS 55  BILITOT 0.5  PROT 6.7  ALBUMIN 3.1*    Coagulation Profile: No results for input(s): INR, PROTIME in the last 168 hours. HbA1C: No results for input(s): HGBA1C in the last 72 hours. CBG: No results for input(s): GLUCAP in the last 168 hours.  No results found for this or any previous visit (from the past 240 hour(s)).   Radiology Studies: No results found.    Marzetta Board, MD, PhD [facility]  Between 7 am - 7 pm I am available, please contact me via Amion (for emergencies) or Securechat (non urgent messages)  Between 7  pm - 7 am I am not available, please contact night coverage MD/APP via Amion

## 2021-07-08 NOTE — Progress Notes (Signed)
Physical Therapy Treatment Patient Details Name: Desiree Mitchell MRN: [id number] DOB: [birth year] Today's Date: 07/08/2021   History of Present Illness Pt is a 65 y.o. female presenting to hospital 05/21/21 with AMS (found unresponsive by family).  Significant pressure sore present back of neck, buttocks, and coccyx.  Pt noted to be infested with bed bugs and roaches.  Pt admitted with acute respiratory failure, rhabdomyolysis, metabolic acidosis, acute kidney injury, sepsis, VA shunt malfunction, transaminitis, and posterior neck/buttocks/coccyx pressure injuries.  S/p removal ventriculoatrial shunt and placement of L parietal approach ventriculostomy 05/22/21; extubated 10/11; MRI brain 10/14 showing few scattered small acute infarcts involving different vascular territories (watershed type infarcts); TEE 10/18; and EVD removed 10/19.    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    PT Comments    Patient received in bed, flat affect, not conversing. She agrees to bed exercises, does not want to do anything else. Performed B LE exercises, needs assistance on her left LE and yelled out in pain with left knee flexion. Patient has difficulty elaborating on pain. She will continue to benefit from skilled PT while here to improve strength and independence, but is limited by fatigue. Needs placement at discharge.        Recommendations for follow up therapy are one component of a multi-disciplinary discharge planning process, led by the attending physician.  Recommendations may be updated based on patient status, additional functional criteria and insurance authorization.  Follow Up Recommendations  Skilled nursing-short term rehab (<3 hours/day)     Assistance Recommended at Discharge Frequent or constant Supervision/Assistance  Equipment Recommendations  None recommended by PT    Recommendations for Other Services        Precautions / Restrictions Precautions Precautions: Fall Precaution Comments: Aspiration; R chest port;  buttocks pressure injuries Restrictions Weight Bearing Restrictions: No     Mobility  Bed Mobility               General bed mobility comments: patient declined    Transfers                   General transfer comment: declined    Ambulation/Gait                   Stairs             Wheelchair Mobility    Modified Rankin (Stroke Patients Only)       Balance                                            Cognition Arousal/Alertness: Awake/alert Behavior During Therapy: Flat affect Overall Cognitive Status: No family/caregiver present to determine baseline cognitive functioning                                 General Comments: not very conversive today. Stated "I'm not very chatty today."        Exercises Other Exercises Other Exercises: B LE strengthenng exercises; AP, heel slides, hip abd/add, SLR x 10 each, needs assistance on left    General Comments        Pertinent Vitals/Pain Pain Assessment: Faces Faces Pain Scale: Hurts even more Negative Vocalization: occasional moan/groan, low speech, negative/disapproving quality Facial Expression: facial  grimacing Body Language: tense, distressed pacing, fidgeting Consolability: distracted or reassured by voice/touch Pain Location: L knee with any movement Pain Descriptors / Indicators: Discomfort;Grimacing;Moaning;Sore Pain Intervention(s): Limited activity within patient's tolerance;Monitored during session;Repositioned    Home Living                          Prior Function            PT Goals (current goals can now be found in the care plan section) Acute Rehab PT Goals Patient Stated Goal: " get better." PT Goal Formulation: With patient Time For Goal Achievement: 07/20/21 Potential to Achieve Goals: Fair Progress towards PT  goals: Not progressing toward goals - comment (not very motivated to work with PT.)    Frequency    Min 2X/week      PT Plan Current plan remains appropriate    Co-evaluation              AM-PAC PT "6 Clicks" Mobility   Outcome Measure  Help needed turning from your back to your side while in a flat bed without using bedrails?: A Lot Help needed moving from lying on your back to sitting on the side of a flat bed without using bedrails?: A Lot Help needed moving to and from a bed to a chair (including a wheelchair)?: Total Help needed standing up from a chair using your arms (e.g., wheelchair or bedside chair)?: Total Help needed to walk in hospital room?: Total Help needed climbing 3-5 steps with a railing? : Total 6 Click Score: 8    End of Session   Activity Tolerance: Patient limited by fatigue;Patient limited by lethargy Patient left: in bed;with call bell/phone within reach Nurse Communication: Mobility status PT Visit Diagnosis: Other abnormalities of gait and mobility (R26.89);Muscle weakness (generalized) (M62.81)     Time: 1000-1011 PT Time Calculation (min) (ACUTE ONLY): 11 min  Charges:  $Therapeutic Exercise: 8-22 mins                     [employer], PT, [facility] 07/08/21,10:22 AM

## 2021-07-09 DIAGNOSIS — I639 Cerebral infarction, unspecified: Secondary | ICD-10-CM | POA: Diagnosis not present

## 2021-07-09 DIAGNOSIS — G9340 Encephalopathy, unspecified: Secondary | ICD-10-CM | POA: Diagnosis not present

## 2021-07-09 NOTE — Progress Notes (Signed)
Occupational Therapy Treatment Patient Details Name: Desiree Mitchell MRN: [id number] DOB: 03/12/1956 Today's Date: 07/09/2021   History of present illness Pt is a 65 y.o. female presenting to hospital 05/21/21 with AMS (found unresponsive by family).  Significant pressure sore present back of neck, buttocks, and coccyx.  Pt noted to be infested with bed bugs and roaches.  Pt admitted with acute respiratory failure, rhabdomyolysis, metabolic acidosis, acute kidney injury, sepsis, VA shunt malfunction, transaminitis, and posterior neck/buttocks/coccyx pressure injuries.  S/p removal ventriculoatrial shunt and placement of L parietal approach ventriculostomy 05/22/21; extubated 10/11; MRI brain 10/14 showing few scattered small acute infarcts involving different vascular territories (watershed type infarcts); TEE 10/18; and EVD removed 10/19.    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   OT comments  Pt seen for OT tx this date to f/u re: safety with ADLs/ADL mobility. OT engages pt in straight arm raise with AAROM on L UE as that side is edematous for 10 reps. engages pt in modified bed level sit-up (sup to long sitting from slightly elevated HOB, FWD reaching with R UE) to address strength in lower abdominals x10, contralateral reaching (seated in high-fowler's) to her tolerance, x10 per side. MOD verbal/visual cues throughout (demonstration). At end of exercise session, OT supports L UE on pillows to assist in reducing edema and provides SETUP for meal tray. Pt simulates meal time with applesauce on tray and is noted to require MIN A with self-feeding this date d/t increased edema and weakness of L UE (appears potentially r/t to lack of use so pt encouraged to move L UE as tolerable).    Recommendations for follow up therapy are one component of a multi-disciplinary discharge planning process, led by the attending physician.   Recommendations may be updated based on patient status, additional functional criteria and insurance authorization.    Follow Up Recommendations  Skilled nursing-short term rehab (<3 hours/day)    Assistance Recommended at Discharge Frequent or constant Supervision/Assistance  Equipment Recommendations  Other (comment) (defer to next level of care)    Recommendations for Other Services      Precautions / Restrictions Precautions Precautions: Fall Precaution Comments: Aspiration; R chest port;  buttocks pressure injuries Restrictions Weight Bearing Restrictions: No       Mobility Bed Mobility Overal bed mobility: Needs Assistance Bed Mobility: Rolling;Supine to Sit Rolling: Mod assist   Supine to sit: Mod assist;+2 for physical assistance Sit to supine: Mod assist;+2 for physical assistance   General bed mobility comments: needs +2 for assist. Once seated at EOB, needs cga to balance.    Transfers Overall transfer level: Needs assistance Equipment used: 2 person hand held assist Transfers: Sit to/from Stand Sit to Stand: Max assist;+2 physical assistance           General transfer comment: needs +2 for standing with B knee blocked. Only able to stand approx 5 second x 2 reps. Quick fatigue     Balance Overall balance assessment: Needs assistance Sitting-balance support: Bilateral upper extremity supported;Feet unsupported Sitting balance-Leahy Scale: Fair     Standing balance support: Bilateral upper extremity supported;During functional activity Standing balance-Leahy Scale: Poor                             ADL either performed or assessed with clinical judgement   ADL Overall ADL's : Needs assistance/impaired Eating/Feeding: Minimal assistance Eating/Feeding Details (  indicate cue type and reason): high fowler's, increased assist this date as L UE is more swollen. Pt encouraged to move it as much as tolerable to help reduce swelling                                         Extremity/Trunk Assessment              Vision       Perception     Praxis      Cognition Arousal/Alertness: Awake/alert Behavior During Therapy: Flat affect Overall Cognitive Status: No family/caregiver present to determine baseline cognitive functioning                                 General Comments: limited conversationally, does answer yes/no appropriately and answer very direct questions, but does not respond to every question and requires increased time to process cues.          Exercises Other Exercises Other Exercises: OT engages pt in straight arm raise with AAROM on L UE as that side is edematous for 10 reps. engages pt in modified bed level sit-up (sup to long sitting from slightly elevated HOB, FWD reaching with R UE) to address strength in lower abdominals x10, contralateral reaching (seated in high-fowler's) to her tolerance, x10 per side. MOD verbal/visual cues throughout (demonstration)   Shoulder Instructions       General Comments      Pertinent Vitals/ Pain       Pain Assessment: Faces Faces Pain Scale: Hurts little more Pain Location: back Pain Descriptors / Indicators: Grimacing;Discomfort Pain Intervention(s): Monitored during session;Repositioned  Home Living                                          Prior Functioning/Environment              Frequency  Min 2X/week        Progress Toward Goals  OT Goals(current goals can now be found in the care plan section)  Progress towards OT goals: Progressing toward goals  Acute Rehab OT Goals Patient Stated Goal: none stated OT Goal Formulation: Patient unable to participate in goal setting Time For Goal Achievement: 08/03/21 Potential to Achieve Goals: [place] Discharge plan remains appropriate;Frequency remains appropriate    Co-evaluation                 AM-PAC OT "6 Clicks" Daily Activity      Outcome Measure   Help from another person eating meals?: A Little Help from another person taking care of personal grooming?: A Little Help from another person toileting, which includes using toliet, bedpan, or urinal?: Total Help from another person bathing (including washing, rinsing, drying)?: A Lot Help from another person to put on and taking off regular upper body clothing?: A Lot Help from another person to put on and taking off regular lower body clothing?: Total 6 Click Score: 12    End of Session Equipment Utilized During Treatment: Rolling walker (2 wheels);Gait belt  OT Visit Diagnosis: Muscle weakness (generalized) (M62.81);Adult, failure to thrive (R62.7);Other symptoms and signs involving cognitive function   Activity Tolerance Patient limited by fatigue;Other (comment)   Patient Left in bed;with call  bell/phone within reach;with bed alarm set   Nurse Communication Mobility status        Time: 2256-7209 OT Time Calculation (min): 15 min  Charges: OT General Charges $OT Visit: 1 Visit OT Treatments $Therapeutic Exercise: 8-22 mins  Gerrianne Scale, [place], OTR/L ascom [phone] 07/09/21, 4:20 PM

## 2021-07-09 NOTE — Progress Notes (Signed)
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  Previously she was last seen normal 10/8.  Initial work-up with a CT of the head showed periventricular edema associated with left VA shunt malposition.  Neurosurgery took patient to the OR emergently on 10/11 with complete removal and of the ventriculoatrial shunt and placement of the left parietal approach ventriculostomy.  The external ventriculostomy drain eventually was removed on 05/30/2021.  This is remained stable since.  Hospital course was also complicated by acute hypoxic respiratory failure requiring ICU stay/intubation now weaned off to room air.  She also developed on 11/14 multifocal infarcts for which neurology evaluated patient.  She also was dysphagia at 1 point and a PEG tube was considered but now eating on her own.  Overall she appears stable, awaiting placement  Subjective / 24h Interval events: No events  Assessment & Plan: Principal Problem Severe sepsis, Proteus, staph epididymitis bacteremia -early in the hospital course, ID consulted and followed patient while hospitalized.  Etiology was suspected to be from chronic wounds.  TTE was unrevealing, a TEE could not be performed due to hemodynamic abnormalities of her epiglottitis.  She has completed antibiotics ceftriaxone and linezolid, ID signed off and currently she is being monitored off antibiotics.  Remains afebrile.  Active Problems Left buttock stage IV pressure ulcer-General surgery consulted, followed patient while hospitalized.  She underwent bedside debridement otherwise has healthy granulation tissue and no  evidence of infection.  General surgery recommends Santyl on the left wound to continue help cleaning with fibrinous material, wet-to-dry saline gauze dressing.  General surgery signed off  VP shunt malfunction, history of aneurysm, prior CVA-she presented with confusion, a CT scan on admission showed development of periventricular edema associated with left VA shunt malposition.  Neurosurgery to go to the OR, shunt was removed and she had an EVD placed which was eventually removed 05/30/2021.  CSF cultures remain negative.  No further interventions per neurosurgical team  Multifocal strokes-during his hospital stay she underwent an MRI which showed multifocal infarcts concerning to be embolic in nature.  Neurology consulted and followed patient while hospitalized.  TTE was unrevealing.  Continue aspirin currently.  Acute hypoxic respiratory failure, ventilatory dependent respiratory failure-initially on admission, eventually liberated from ventilation and currently on room air    Hypertension -Continue Norvasc, metoprolol   Low normal vitamin B12-Received 3 days of IM vitamin B12 supplementation. Continue oral supplementation.  Acute kidney injury-creatinine 2.8 on admission, now normalized  Dysphagia -initially declined PEG tube placement.  NG tube has been removed, tube feeding discontinued.  Continue comfort feeds.  Speech therapy following. On dysphagia 2 diet.   GERD-Continue PPI   Normocytic anemia-Continue to monitor H&H.  She was transfused with a unit of PRBC on 11/2.  Hemoglobin today stable.  Depression-started on citalopram  Goals of care-Palliative care following.  Initially she was on comfort care which has been discontinued because her oral status improved.  Case manager is following for placing her on skilled nursing facility.    Scheduled Meds:  amLODipine  10 mg Oral Daily   aspirin EC  81 mg Oral Daily   citalopram  10 mg Oral Daily   collagenase   Topical  Daily    docusate sodium  100 mg Oral BID   feeding supplement (NEPRO CARB STEADY)  237 mL Oral TID BM   mouth rinse  15 mL Mouth Rinse BID   metoprolol tartrate  25 mg Oral BID   multivitamin with minerals  1 tablet Oral Daily   pantoprazole  40 mg Oral Daily   senna-docusate  1 tablet Oral BID   sodium hypochlorite   Irrigation Q2000   vitamin B-12  1,000 mcg Oral Daily   Continuous Infusions: PRN Meds:.acetaminophen **OR** acetaminophen, albuterol, ondansetron **OR** [DISCONTINUED] ondansetron (ZOFRAN) IV, polyvinyl alcohol  Diet Orders (From admission, onward)     Start     Ordered   06/12/21 1547  DIET DYS 2 Room service appropriate? Yes with Assist; Fluid consistency: Nectar Thick  Diet effective now       Comments: NO STRAWS!  Extra gravy on MINCED meats, potatoes.  Question Answer Comment  Room service appropriate? Yes with Assist   Fluid consistency: Nectar Thick      06/12/21 1548            DVT prophylaxis: Place and maintain sequential compression device Start: 06/21/21 1303 Place and maintain sequential compression device Start: 06/16/21 1101     Code Status: DNR  Family Communication: no family at bedisde  Status is: Inpatient  Remains inpatient appropriate because: Awaiting placement  Level of care: Med-Surg  Consultants:  Neurosurgery Neurology General surgery Palliative care  Antimicrobials: None currently     Objective: Vitals:   07/09/21 0500 07/09/21 0527 07/09/21 0800 07/09/21 1100  BP:  137/62 (!) 146/77 (!) 142/73  Pulse:  72 72 75  Resp:  18 18 16   Temp:  98.6 F (37 C) 98.9 F (37.2 C) 99.3 F (37.4 C)  TempSrc:  Oral Oral Oral  SpO2:  96% 95% 95%  Weight: 100 kg     Height:        Intake/Output Summary (Last 24 hours) at 07/09/2021 1622 Last data filed at 07/09/2021 1410 Gross per 24 hour  Intake 120 ml  Output --  Net 120 ml    Filed Weights   07/05/21 0500 07/07/21 0500 07/09/21 0500  Weight: 100 kg 103 kg 100 kg     Examination:  Constitutional: nad Respiratory: cta Cardiovascular: rrr, no mrg  Data Reviewed: I have independently reviewed following labs and imaging studies  CBC: Recent Labs  Lab 07/07/21 0910  WBC 10.9*  HGB 12.9  HCT 41.8  MCV 98.1  PLT 409*    Basic Metabolic Panel: Recent Labs  Lab 07/07/21 0910  NA 142  K 3.9  CL 107  CO2 27  GLUCOSE 105*  BUN 27*  CREATININE 0.84  CALCIUM 8.8*    Liver Function Tests: Recent Labs  Lab 07/07/21 0910  AST 17  ALT 18  ALKPHOS 55  BILITOT 0.5  PROT 6.7  ALBUMIN 3.1*    Coagulation Profile: No results for input(s): INR, PROTIME in the last 168 hours. HbA1C: No results for input(s): HGBA1C in the last 72 hours. CBG: No results for input(s): GLUCAP in the last 168 hours.  No results found for this or any previous visit (from the past 240 hour(s)).   Radiology Studies: No results found.    Marzetta Board, MD, PhD [facility]  Between 7 am - 7 pm I am available, please contact me via Amion (for emergencies) or Securechat (non urgent messages)  Between 7 pm - 7 am I am not  available, please contact night coverage MD/APP via Amion

## 2021-07-09 NOTE — Progress Notes (Signed)
Physical Therapy Treatment Patient Details Name: Desiree Mitchell MRN: [id number] DOB: 03/03/1956 Today's Date: 07/09/2021   History of Present Illness Pt is a 65 y.o. female presenting to hospital 05/21/21 with AMS (found unresponsive by family).  Significant pressure sore present back of neck, buttocks, and coccyx.  Pt noted to be infested with bed bugs and roaches.  Pt admitted with acute respiratory failure, rhabdomyolysis, metabolic acidosis, acute kidney injury, sepsis, VA shunt malfunction, transaminitis, and posterior neck/buttocks/coccyx pressure injuries.  S/p removal ventriculoatrial shunt and placement of L parietal approach ventriculostomy 05/22/21; extubated 10/11; MRI brain 10/14 showing few scattered small acute infarcts involving different vascular territories (watershed type infarcts); TEE 10/18; and EVD removed 10/19.    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    PT Comments    Pt is making gradual progress towards goals with ability to stand x 2 reps this date. Very flat affect, however is able to engage with therapist. Needs encouragement to attempt OOB mobility. +2 for safety and limited endurance with standing. Not able to take steps at this time. Will continue to progress.    Recommendations for follow up therapy are one component of a multi-disciplinary discharge planning process, led by the attending physician.  Recommendations may be updated based on patient status, additional functional criteria and insurance authorization.  Follow Up Recommendations  Skilled nursing-short term rehab (<3 hours/day)     Assistance Recommended at Discharge Frequent or constant Supervision/Assistance  Equipment Recommendations  None recommended by PT    Recommendations for Other Services       Precautions / Restrictions Precautions Precautions: Fall Precaution Comments: Aspiration; R chest port;  buttocks  pressure injuries Restrictions Weight Bearing Restrictions: No     Mobility  Bed Mobility Overal bed mobility: Needs Assistance Bed Mobility: Rolling;Supine to Sit Rolling: Mod assist   Supine to sit: Mod assist;+2 for physical assistance Sit to supine: Mod assist;+2 for physical assistance   General bed mobility comments: needs +2 for assist. Once seated at EOB, needs cga to balance.    Transfers Overall transfer level: Needs assistance Equipment used: 2 person hand held assist Transfers: Sit to/from Stand Sit to Stand: Max assist;+2 physical assistance           General transfer comment: needs +2 for standing with B knee blocked. Only able to stand approx 5 second x 2 reps. Quick fatigue    Ambulation/Gait               General Gait Details: unable to take steps/shift weight in standing   Stairs             Wheelchair Mobility    Modified Rankin (Stroke Patients Only)       Balance Overall balance assessment: Needs assistance Sitting-balance support: Bilateral upper extremity supported;Feet unsupported Sitting balance-Leahy Scale: Fair     Standing balance support: Bilateral upper extremity supported;During functional activity Standing balance-Leahy Scale: Poor                              Cognition Arousal/Alertness: Awake/alert Behavior During Therapy: Flat affect Overall Cognitive Status: No family/caregiver present to determine baseline cognitive functioning                                 General Comments: very stoic  Exercises Other Exercises Other Exercises: supine ther-ex performed on B LE including AP, quad sets, hip add squeezes, SAQ, and attempted supine bridging. 10 reps performed. Also performed rolling for linen placement    General Comments        Pertinent Vitals/Pain Pain Assessment: Faces Faces Pain Scale: Hurts even more Pain Location: R knee and back Pain Descriptors /  Indicators: Discomfort;Grimacing;Moaning;Sore Pain Intervention(s): Limited activity within patient's tolerance;Repositioned    Home Living                          Prior Function            PT Goals (current goals can now be found in the care plan section) Acute Rehab PT Goals Patient Stated Goal: " get better." PT Goal Formulation: With patient Time For Goal Achievement: 07/20/21 Potential to Achieve Goals: Fair Progress towards PT goals: Progressing toward goals    Frequency    Min 2X/week      PT Plan Current plan remains appropriate    Co-evaluation              AM-PAC PT "6 Clicks" Mobility   Outcome Measure  Help needed turning from your back to your side while in a flat bed without using bedrails?: A Lot Help needed moving from lying on your back to sitting on the side of a flat bed without using bedrails?: A Lot Help needed moving to and from a bed to a chair (including a wheelchair)?: A Lot Help needed standing up from a chair using your arms (e.g., wheelchair or bedside chair)?: A Lot Help needed to walk in hospital room?: Total Help needed climbing 3-5 steps with a railing? : Total 6 Click Score: 10    End of Session Equipment Utilized During Treatment: Gait belt Activity Tolerance: Patient limited by fatigue;Patient limited by lethargy Patient left: in bed;with call bell/phone within reach Nurse Communication: Mobility status PT Visit Diagnosis: Other abnormalities of gait and mobility (R26.89);Muscle weakness (generalized) (M62.81)     Time: 6045-4098 PT Time Calculation (min) (ACUTE ONLY): 24 min  Charges:  $Therapeutic Exercise: 8-22 mins $Therapeutic Activity: 8-22 mins                     Greggory Stallion, PT, DPT [phone]    Afomia Blackley 07/09/2021, 2:16 PM

## 2021-07-09 NOTE — TOC Progression Note (Signed)
Transition of Care [facility]) - Progression Note    Patient Details  Name: Desiree Mitchell MRN: [id number] Date of Birth: 06/13/1956  [facility] of [facility]) CM/SW Contact  Shelbie Hutching, RN Phone Number: 07/09/2021, 11:02 AM  Clinical Narrative:    RNCM has been unsuccessful with getting in touch with Debbie over at [facility], still have not heard about SNF auth.  Neoma Laming at [facility] offered a bed.  Bed accepted, daughter wanted to be closer to this area anyway, Di Kindle will start auth today.    Expected Discharge Plan: Skilled Nursing Facility Barriers to Discharge: Insurance Authorization  Expected Discharge Plan and Services Expected Discharge Plan: [place] In-house Referral: Clinical Social Work Discharge Planning Services: CM Consult Post Acute Care Choice: [place] Living arrangements for the past 2 months: Single Family Home                 DME Arranged: N/A DME Agency: NA       HH Arranged: NA HH Agency: NA         Social Determinants of Health (SDOH) Interventions    Readmission Risk Interventions No flowsheet data found.

## 2021-07-10 DIAGNOSIS — G9340 Encephalopathy, unspecified: Secondary | ICD-10-CM | POA: Diagnosis not present

## 2021-07-10 DIAGNOSIS — I639 Cerebral infarction, unspecified: Secondary | ICD-10-CM | POA: Diagnosis not present

## 2021-07-10 LAB — RESP PANEL BY RT-PCR (FLU A&B, COVID) ARPGX2
Influenza A by PCR: NEGATIVE
Influenza B by PCR: NEGATIVE
SARS Coronavirus 2 by RT PCR: NEGATIVE

## 2021-07-10 NOTE — TOC Progression Note (Signed)
Transition of Care [facility]) - Progression Note    Patient Details  Name: Frayda Egley MRN: [id number] Date of Birth: 05-13-1956  Transition of Care [facility]) CM/SW [place], LCSW Phone Number: 07/10/2021, 4:37 PM  Clinical Narrative:   Insurance authorization approved. [facility] can accept her tomorrow. MD and daughter aware.  Expected Discharge Plan: Skilled Nursing Facility Barriers to Discharge: Insurance Authorization  Expected Discharge Plan and Services Expected Discharge Plan: [place] In-house Referral: Clinical Social Work Discharge Planning Services: CM Consult Post Acute Care Choice: [place] Living arrangements for the past 2 months: Single Family Home                 DME Arranged: N/A DME Agency: NA       HH Arranged: NA HH Agency: NA         Social Determinants of Health (SDOH) Interventions    Readmission Risk Interventions No flowsheet data found.

## 2021-07-10 NOTE — Progress Notes (Signed)
Nutrition Follow-up  DOCUMENTATION CODES:  Obesity unspecified  INTERVENTION:  Continue Nepro shakes TID.  Continue Magic Cup TID.  Continue MVI with minerals daily.  Encourage PO and supplement intake.  NUTRITION DIAGNOSIS:  Inadequate oral intake related to acute illness as evidenced by NPO status. - resolved  GOAL:  Patient will meet greater than or equal to 90% of their needs. - not meeting  MONITOR:  PO intake, Supplement acceptance, Diet advancement, Labs, Weight trends, Skin, I & O's  REASON FOR ASSESSMENT:  Consult Assessment of nutrition requirement/status  ASSESSMENT:  65 y/o female with h/o HTN, asthma, breast cancer, CVA and subarachnoid hemorrhage s/p shunt placement for management of hydrocephalus > 10 years ago who is now admitted with shunt malfunction, sepsis and AMS now s/p left parietal approach ventriculostomy 10/11 10/28 - refused PEG placement, transitioned to comfort care 10/31 - comfort care rescinded  11/1- s/p BSE - advanced to dysphagia 2 diet with nectar thick liquids  11/5 - rectal pouch removed   Pt family rescinded comfort care, with hopes that pt will improve. They do not desire PEG or artificial means of nutrition/hydration.    Per general surgery notes, pt with sacral wound; no plans for surgical debridement at this time and general surgery has signed off.    Per TOC notes, pt medically stable for discharge and awaiting insurance authorization for SNF.   Pt resting soundly at time of RD visit. RD chose not to disturb at this time.  PO intake decreasing. Pt taking in mostly 0% of documented meals. Did have 50% of breakfast this morning.  Admit wt: 88.6 kg Current wt: 100 kg  Of note, pt with severe LUE and mild BLE edema.  Continue current nutrition plan.  Supplements: Nepro shakes TID  Medications: reviewed; colace BID, MVI with minerals, Protonix, Senokot BID, Vitamin B12  Labs: reviewed; Glucose 105 (H), BUN 27 (H - trending  up)  Diet Order:   Diet Order             DIET DYS 2 Room service appropriate? Yes with Assist; Fluid consistency: Nectar Thick  Diet effective now                  EDUCATION NEEDS:  No education needs have been identified at this time  Skin:  Skin Assessment: Skin Integrity Issues: Skin Integrity Issues:: Unstageable DTI: rt buttocks, cervical Stage II: coccyx Unstageable: bilateral buttocks Incisions: closed head and lt thorat  Last BM:  07/09/21  Height:  Ht Readings from Last 1 Encounters:  06/04/21 5\' 2"  (1.575 m)   Weight:  Wt Readings from Last 1 Encounters:  07/09/21 100 kg   BMI:  Body mass index is 40.31 kg/m.  Estimated Nutritional Needs:  Kcal:  1800-2100kcal/day Protein:  90-105g/day Fluid:  1.5-1.8L/day  Derrel Nip, RD, LDN (she/her/hers) Clinical Inpatient Dietitian RD Pager/After-Hours/Weekend Pager # in [place]

## 2021-07-10 NOTE — Progress Notes (Signed)
PROGRESS NOTE  Desiree Mitchell SWH:[id number] DOB: 1955-10-04 DOA: 05/21/2021 PCP: Pcp, No   LOS: 78 days   Brief Narrative / Interim history: 65 year old female with history of breast cancer/bilateral mastectomy, deviation from prior subarachnoid hemorrhage and hydrocephalus, prior CVA with residual right-sided deficits, HTN who was admitted to [facility] on 05/21/2021 after being found unresponsive by family.  Previously she was last seen normal 10/8.  Initial work-up with a CT of the head showed periventricular edema associated with left VA shunt malposition.  Neurosurgery took patient to the OR emergently on 10/11 with complete removal and of the ventriculoatrial shunt and placement of the left parietal approach ventriculostomy.  The external ventriculostomy drain eventually was removed on 05/30/2021.  This is remained stable since.  Hospital course was also complicated by acute hypoxic respiratory failure requiring ICU stay/intubation now weaned off to room air.  She also developed on 11/14 multifocal infarcts for which neurology evaluated patient.  She also was dysphagia at 1 point and a PEG tube was considered but now eating on her own.  Overall she appears stable, awaiting placement  Subjective / 24h Interval events: No complaints  Assessment & Plan: Principal Problem Severe sepsis, Proteus, staph epididymitis bacteremia -early in the hospital course, ID consulted and followed patient while hospitalized.  Etiology was suspected to be from chronic wounds.  TTE was unrevealing, a TEE could not be performed due to hemodynamic abnormalities of her epiglottitis.  She has completed antibiotics ceftriaxone and linezolid, ID signed off and currently she is being monitored off antibiotics.  Remains afebrile.  Active Problems Left buttock stage IV pressure ulcer-General surgery consulted, followed patient while hospitalized.  She underwent bedside debridement otherwise has healthy granulation tissue and no  evidence of infection.  General surgery recommends Santyl on the left wound to continue help cleaning with fibrinous material, wet-to-dry saline gauze dressing.  General surgery signed off  VP shunt malfunction, history of aneurysm, prior CVA-she presented with confusion, a CT scan on admission showed development of periventricular edema associated with left VA shunt malposition.  Neurosurgery to go to the OR, shunt was removed and she had an EVD placed which was eventually removed 05/30/2021.  CSF cultures remain negative.  No further interventions per neurosurgical team  Multifocal strokes-during his hospital stay she underwent an MRI which showed multifocal infarcts concerning to be embolic in nature.  Neurology consulted and followed patient while hospitalized.  TTE was unrevealing.  Continue aspirin currently.  Acute hypoxic respiratory failure, ventilatory dependent respiratory failure-initially on admission, eventually liberated from ventilation and currently on room air    Hypertension -Continue Norvasc, metoprolol   Low normal vitamin B12-Received 3 days of IM vitamin B12 supplementation. Continue oral supplementation.  Acute kidney injury-creatinine 2.8 on admission, now normalized  Dysphagia -initially declined PEG tube placement.  NG tube has been removed, tube feeding discontinued.  Continue comfort feeds.  Speech therapy following. On dysphagia 2 diet.   GERD-Continue PPI   Normocytic anemia-Continue to monitor H&H.  She was transfused with a unit of PRBC on 11/2.  Hemoglobin today stable.  Depression-started on citalopram  Goals of care-Palliative care following.  Initially she was on comfort care which has been discontinued because her oral status improved.  Case manager is following for placing her on skilled nursing facility.    Scheduled Meds:  amLODipine  10 mg Oral Daily   aspirin EC  81 mg Oral Daily   citalopram  10 mg Oral Daily   collagenase   Topical  Daily    docusate sodium  100 mg Oral BID   feeding supplement (NEPRO CARB STEADY)  237 mL Oral TID BM   mouth rinse  15 mL Mouth Rinse BID   metoprolol tartrate  25 mg Oral BID   multivitamin with minerals  1 tablet Oral Daily   pantoprazole  40 mg Oral Daily   senna-docusate  1 tablet Oral BID   sodium hypochlorite   Irrigation Q2000   vitamin B-12  1,000 mcg Oral Daily   Continuous Infusions: PRN Meds:.acetaminophen **OR** acetaminophen, albuterol, ondansetron **OR** [DISCONTINUED] ondansetron (ZOFRAN) IV, polyvinyl alcohol  Diet Orders (From admission, onward)     Start     Ordered   06/12/21 1547  DIET DYS 2 Room service appropriate? Yes with Assist; Fluid consistency: Nectar Thick  Diet effective now       Comments: NO STRAWS!  Extra gravy on MINCED meats, potatoes.  Question Answer Comment  Room service appropriate? Yes with Assist   Fluid consistency: Nectar Thick      06/12/21 1548            DVT prophylaxis: Place and maintain sequential compression device Start: 06/21/21 1303 Place and maintain sequential compression device Start: 06/16/21 1101     Code Status: DNR  Family Communication: no family at bedisde  Status is: Inpatient  Remains inpatient appropriate because: Awaiting placement  Level of care: Med-Surg  Consultants:  Neurosurgery Neurology General surgery Palliative care  Antimicrobials: None currently     Objective: Vitals:   07/09/21 2321 07/10/21 0555 07/10/21 0753 07/10/21 1139  BP: (!) 144/67 (!) 142/80 136/78 (!) 157/85  Pulse: 63 67 68 80  Resp: 18 16 18 20   Temp: 98.4 F (36.9 C) 98 F (36.7 C) 98.5 F (36.9 C) 98.6 F (37 C)  TempSrc: Oral Oral Oral Oral  SpO2: 94% 95% 100% 94%  Weight:      Height:        Intake/Output Summary (Last 24 hours) at 07/10/2021 1254 Last data filed at 07/10/2021 0900 Gross per 24 hour  Intake 360 ml  Output 250 ml  Net 110 ml    Filed Weights   07/05/21 0500 07/07/21 0500 07/09/21 0500   Weight: 100 kg 103 kg 100 kg    Examination:  Constitutional: nad Respiratory: cta Cardiovascular: rrr, no mrg  Data Reviewed: I have independently reviewed following labs and imaging studies  CBC: Recent Labs  Lab 07/07/21 0910  WBC 10.9*  HGB 12.9  HCT 41.8  MCV 98.1  PLT 409*    Basic Metabolic Panel: Recent Labs  Lab 07/07/21 0910  NA 142  K 3.9  CL 107  CO2 27  GLUCOSE 105*  BUN 27*  CREATININE 0.84  CALCIUM 8.8*    Liver Function Tests: Recent Labs  Lab 07/07/21 0910  AST 17  ALT 18  ALKPHOS 55  BILITOT 0.5  PROT 6.7  ALBUMIN 3.1*    Coagulation Profile: No results for input(s): INR, PROTIME in the last 168 hours. HbA1C: No results for input(s): HGBA1C in the last 72 hours. CBG: No results for input(s): GLUCAP in the last 168 hours.  No results found for this or any previous visit (from the past 240 hour(s)).   Radiology Studies: No results found.    Marzetta Board, MD, PhD [facility]  Between 7 am - 7 pm I am available, please contact me via Amion (for emergencies) or Securechat (non urgent messages)  Between 7 pm - 7  am I am not available, please contact night coverage MD/APP via Amion

## 2021-07-10 NOTE — Care Management Important Message (Signed)
Important Message  Patient Details  Name: Desiree Mitchell MRN: [id number] Date of Birth: 1956-01-16   Medicare Important Message Given:  Yes  I reviewed the Important Message from Medicare with the patient's daughter, [name] [phone]) again.  She had signed the original IM and returned to me. No additional copy needed. I thanked her for time.   Juliann Pulse A [name] 07/10/2021, 9:38 AM

## 2021-07-11 ENCOUNTER — Encounter: Payer: Self-pay | Admitting: Medical Oncology

## 2021-07-11 DIAGNOSIS — A419 Sepsis, unspecified organism: Secondary | ICD-10-CM | POA: Diagnosis not present

## 2021-07-11 DIAGNOSIS — J9601 Acute respiratory failure with hypoxia: Secondary | ICD-10-CM | POA: Diagnosis not present

## 2021-07-11 DIAGNOSIS — R6520 Severe sepsis without septic shock: Secondary | ICD-10-CM | POA: Diagnosis not present

## 2021-07-11 MED ORDER — SENNOSIDES-DOCUSATE SODIUM 8.6-50 MG PO TABS: 1.0000 | ORAL_TABLET | Freq: Two times a day (BID) | ORAL | Status: DC

## 2021-07-11 MED ORDER — CYANOCOBALAMIN 1000 MCG PO TABS: 1000.0000 ug | ORAL_TABLET | Freq: Every day | ORAL | Status: DC

## 2021-07-11 MED ORDER — ASPIRIN 81 MG PO TBEC: 81.0000 mg | DELAYED_RELEASE_TABLET | Freq: Every day | ORAL | 11 refills | Status: DC

## 2021-07-11 MED ORDER — CITALOPRAM HYDROBROMIDE 10 MG PO TABS: 10.0000 mg | ORAL_TABLET | Freq: Every day | ORAL | Status: DC

## 2021-07-11 MED ORDER — DOCUSATE SODIUM 100 MG PO CAPS: 100.0000 mg | ORAL_CAPSULE | Freq: Two times a day (BID) | ORAL | 0 refills | Status: AC

## 2021-07-11 MED ORDER — PANTOPRAZOLE SODIUM 40 MG PO TBEC: 40.0000 mg | DELAYED_RELEASE_TABLET | Freq: Every day | ORAL | Status: AC

## 2021-07-11 MED ORDER — DAKINS (1/4 STRENGTH) 0.125 % EX SOLN: Freq: Every day | CUTANEOUS | 0 refills | Status: DC

## 2021-07-11 MED ORDER — COLLAGENASE 250 UNIT/GM EX OINT: TOPICAL_OINTMENT | Freq: Every day | CUTANEOUS | 0 refills | Status: DC

## 2021-07-11 MED ORDER — AMLODIPINE BESYLATE 10 MG PO TABS: 10.0000 mg | ORAL_TABLET | Freq: Every day | ORAL | Status: AC

## 2021-07-11 MED ORDER — METOPROLOL TARTRATE 25 MG PO TABS: 25.0000 mg | ORAL_TABLET | Freq: Two times a day (BID) | ORAL | Status: AC

## 2021-07-11 MED ORDER — NEPRO/CARBSTEADY PO LIQD: 237.0000 mL | Freq: Three times a day (TID) | ORAL | 0 refills | Status: DC

## 2021-07-11 NOTE — Discharge Summary (Signed)
Physician Discharge Summary  Desiree Mitchell:[id number] DOB: Jun 08, 1956 DOA: 05/21/2021  PCP: Pcp, No  Admit date: 05/21/2021 Discharge date: 07/11/2021  Admitted From: Home Disposition:  SNF  Recommendations for Outpatient Follow-up:  Follow up with PCP in 1-2 weeks Follow up with surgeon as directed  Home Health:No Equipment/Devices:None   Discharge Condition:Stable CODE STATUS:DNR  Diet recommendation: Dysphagia 2  Brief/Interim Summary: 65 year old female with history of breast cancer/bilateral mastectomy, deviation from prior subarachnoid hemorrhage and hydrocephalus, prior CVA with residual right-sided deficits, HTN who was admitted to [facility] on 05/21/2021 after being found unresponsive by family.  Previously she was last seen normal 10/8.  Initial work-up with a CT of the head showed periventricular edema associated with left VA shunt malposition.  Neurosurgery took patient to the OR emergently on 10/11 with complete removal and of the ventriculoatrial shunt and placement of the left parietal approach ventriculostomy.  The external ventriculostomy drain eventually was removed on 05/30/2021.  This is remained stable since.  Hospital course was also complicated by acute hypoxic respiratory failure requiring ICU stay/intubation now weaned off to room air.  She also developed on 11/14 multifocal infarcts for which neurology evaluated patient.  She also was dysphagia at 1 point and a PEG tube was considered but now eating on her own.  Overall she appears stable, awaiting placement.  Insurance authorization obtained to [facility]     Discharge Diagnoses:  Principal Problem:   Sepsis [facility]) Active Problems:   Endotracheally intubated   On mechanically assisted ventilation (HCC)   Shunt malfunction   Rhabdomyolysis   AKI (acute kidney injury) ([place])   Increased anion gap metabolic acidosis   Acute encephalopathy   Transaminitis   Pressure injury of skin   Acute embolic  stroke ([place])   Palliative care by specialist   DNR (do not resuscitate)   Pressure injury of left buttock, stage 4 (HCC)  Severe sepsis, Proteus, staph epididymitis bacteremia -early in the hospital course, ID consulted and followed patient while hospitalized.  Etiology was suspected to be from chronic wounds.  TTE was unrevealing, a TEE could not be performed due to hemodynamic abnormalities of her epiglottitis.  She has completed antibiotics ceftriaxone and linezolid, ID signed off and currently she is being monitored off antibiotics.  Remains afebrile.   Active Problems Left buttock stage IV pressure ulcer-General surgery consulted, followed patient while hospitalized.  She underwent bedside debridement otherwise has healthy granulation tissue and no evidence of infection.  General surgery recommends Santyl on the left wound to continue help cleaning with fibrinous material, wet-to-dry saline gauze dressing.  General surgery signed off   VP shunt malfunction, history of aneurysm, prior CVA-she presented with confusion, a CT scan on admission showed development of periventricular edema associated with left VA shunt malposition.  Neurosurgery to go to the OR, shunt was removed and she had an EVD placed which was eventually removed 05/30/2021.  CSF cultures remain negative.  No further interventions per neurosurgical team   Multifocal strokes-during his hospital stay she underwent an MRI which showed multifocal infarcts concerning to be embolic in nature.  Neurology consulted and followed patient while hospitalized.  TTE was unrevealing.  Continue aspirin currently.   Acute hypoxic respiratory failure, ventilatory dependent respiratory failure-initially on admission, eventually liberated from ventilation and currently on room air    Hypertension -Continue Norvasc, metoprolol   Low normal vitamin B12-Received 3 days of IM vitamin B12 supplementation. Continue oral supplementation.   Acute kidney  injury-creatinine 2.8 on  admission, now normalized   Dysphagia -initially declined PEG tube placement.  NG tube has been removed, tube feeding discontinued.  Continue comfort feeds.  Speech therapy following. On dysphagia 2 diet.   GERD-Continue PPI   Normocytic anemia-Continue to monitor H&H.  She was transfused with a unit of PRBC on 11/2.  Hemoglobin today stable.   Depression-started on citalopram   Goals of care-Palliative care following.  Initially she was on comfort care which has been discontinued because her oral status improved.  Case manager is following for placing her on skilled nursing facility.    Discharge Instructions  Discharge Instructions     Diet - low sodium heart healthy   Complete by: As directed    Discharge wound care:   Complete by: As directed    Wound care  Daily      Comments: Use 1/4% Dakins moist gauze on buttock wounds daily, top with dry dressing. Change daily   Increase activity slowly   Complete by: As directed       Allergies as of 07/11/2021   No Known Allergies      Medication List     TAKE these medications    amLODipine 10 MG tablet Commonly known as: NORVASC Take 1 tablet (10 mg total) by mouth daily.   aspirin 81 MG EC tablet Take 1 tablet (81 mg total) by mouth daily. Swallow whole.   citalopram 10 MG tablet Commonly known as: CELEXA Take 1 tablet (10 mg total) by mouth daily.   collagenase ointment Commonly known as: SANTYL Apply topically daily.   cyanocobalamin 1000 MCG tablet Take 1 tablet (1,000 mcg total) by mouth daily.   docusate sodium 100 MG capsule Commonly known as: COLACE Take 1 capsule (100 mg total) by mouth 2 (two) times daily.   feeding supplement (NEPRO CARB STEADY) Liqd Take 237 mLs by mouth 3 (three) times daily between meals.   metoprolol tartrate 25 MG tablet Commonly known as: LOPRESSOR Take 1 tablet (25 mg total) by mouth 2 (two) times daily.   pantoprazole 40 MG tablet Commonly  known as: PROTONIX Take 1 tablet (40 mg total) by mouth daily.   senna-docusate 8.6-50 MG tablet Commonly known as: Senokot-S Take 1 tablet by mouth 2 (two) times daily.   sodium hypochlorite 0.125 % Soln Commonly known as: DAKIN'S 1/4 STRENGTH Irrigate with as directed daily at 8 pm.               Discharge Care Instructions  (From admission, onward)           Start     Ordered   07/11/21 0000  Discharge wound care:       Comments: Wound care  Daily      Comments: Use 1/4% Dakins moist gauze on buttock wounds daily, top with dry dressing. Change daily   07/11/21 0809            Contact information for after-discharge care     Destination     [facility] .   Service: Skilled Nursing Contact information: [address] [place] [phone]                    No Known Allergies  Consultations: Surgery NSG Palliative care   Procedures/Studies: No results found.    Subjective: Seen and examined on dc day.  Stable, no distress  Discharge Exam: Vitals:   07/10/21 2351 07/11/21 0609  BP: 120/86 Marland Kitchen)  148/86  Pulse: (!) 105 74  Resp: 20 18  Temp: 99.1 F (37.3 C) 98.4 F (36.9 C)  SpO2: 94% 93%   Vitals:   07/10/21 1604 07/10/21 2053 07/10/21 2351 07/11/21 0609  BP: 133/70 (!) 146/83 120/86 (!) 148/86  Pulse: 76 97 (!) 105 74  Resp: 20 17 20 18   Temp: 98.3 F (36.8 C) 98.5 F (36.9 C) 99.1 F (37.3 C) 98.4 F (36.9 C)  TempSrc: Oral Oral Oral Oral  SpO2: 95% 94% 94% 93%  Weight:      Height:        General: Pt is alert, awake, not in acute distress Cardiovascular: RRR, S1/S2 +, no rubs, no gallops Respiratory: CTA bilaterally, no wheezing, no rhonchi Abdominal: Soft, NT, ND, bowel sounds + Extremities: no edema, no cyanosis    The results of significant diagnostics from this hospitalization (including imaging, microbiology, ancillary and laboratory) are listed  below for reference.     Microbiology: Recent Results (from the past 240 hour(s))  Resp Panel by RT-PCR (Flu A&B, Covid) Nasopharyngeal Swab     Status: None   Collection Time: 07/10/21  5:00 PM   Specimen: Nasopharyngeal Swab; Nasopharyngeal(NP) swabs in vial transport medium  Result Value Ref Range Status   SARS Coronavirus 2 by RT PCR NEGATIVE NEGATIVE Final    Comment: (NOTE) SARS-CoV-2 target nucleic acids are NOT DETECTED.  The SARS-CoV-2 RNA is generally detectable in upper respiratory specimens during the acute phase of infection. The lowest concentration of SARS-CoV-2 viral copies this assay can detect is 138 copies/mL. A negative result does not preclude SARS-Cov-2 infection and should not be used as the sole basis for treatment or other patient management decisions. A negative result may occur with  improper specimen collection/handling, submission of specimen other than nasopharyngeal swab, presence of viral mutation(s) within the areas targeted by this assay, and inadequate number of viral copies(<138 copies/mL). A negative result must be combined with clinical observations, patient history, and epidemiological information. The expected result is Negative.  Fact Sheet for Patients:  EntrepreneurPulse.com.au  Fact Sheet for Healthcare Providers:  IncredibleEmployment.be  This test is no t yet approved or cleared by the [place] FDA and  has been authorized for detection and/or diagnosis of SARS-CoV-2 by FDA under an Emergency Use Authorization (EUA). This EUA will remain  in effect (meaning this test can be used) for the duration of the COVID-19 declaration under Section 564(b)(1) of the Act, 21 U.S.C.section 360bbb-3(b)(1), unless the authorization is terminated  or revoked sooner.       Influenza A by PCR NEGATIVE NEGATIVE Final   Influenza B by PCR NEGATIVE NEGATIVE Final    Comment: (NOTE) The Xpert Xpress  SARS-CoV-2/FLU/RSV plus assay is intended as an aid in the diagnosis of influenza from Nasopharyngeal swab specimens and should not be used as a sole basis for treatment. Nasal washings and aspirates are unacceptable for Xpert Xpress SARS-CoV-2/FLU/RSV testing.  Fact Sheet for Patients: EntrepreneurPulse.com.au  Fact Sheet for Healthcare Providers: IncredibleEmployment.be  This test is not yet approved or cleared by the [place] FDA and has been authorized for detection and/or diagnosis of SARS-CoV-2 by FDA under an Emergency Use Authorization (EUA). This EUA will remain in effect (meaning this test can be used) for the duration of the COVID-19 declaration under Section 564(b)(1) of the Act, 21 U.S.C. section 360bbb-3(b)(1), unless the authorization is terminated or revoked.  Performed at [facility], [address], [place], [place] [address]  Labs: BNP (last 3 results) No results for input(s): BNP in the last 8760 hours. Basic Metabolic Panel: Recent Labs  Lab 07/07/21 0910  NA 142  K 3.9  CL 107  CO2 27  GLUCOSE 105*  BUN 27*  CREATININE 0.84  CALCIUM 8.8*   Liver Function Tests: Recent Labs  Lab 07/07/21 0910  AST 17  ALT 18  ALKPHOS 55  BILITOT 0.5  PROT 6.7  ALBUMIN 3.1*   No results for input(s): LIPASE, AMYLASE in the last 168 hours. No results for input(s): AMMONIA in the last 168 hours. CBC: Recent Labs  Lab 07/07/21 0910  WBC 10.9*  HGB 12.9  HCT 41.8  MCV 98.1  PLT 409*   Cardiac Enzymes: No results for input(s): CKTOTAL, CKMB, CKMBINDEX, TROPONINI in the last 168 hours. BNP: Invalid input(s): POCBNP CBG: No results for input(s): GLUCAP in the last 168 hours. D-Dimer No results for input(s): DDIMER in the last 72 hours. Hgb A1c No results for input(s): HGBA1C in the last 72 hours. Lipid Profile No results for input(s): CHOL, HDL, LDLCALC, TRIG, CHOLHDL, LDLDIRECT in the last  72 hours. Thyroid function studies No results for input(s): TSH, T4TOTAL, T3FREE, THYROIDAB in the last 72 hours.  Invalid input(s): FREET3 Anemia work up No results for input(s): VITAMINB12, FOLATE, FERRITIN, TIBC, IRON, RETICCTPCT in the last 72 hours. Urinalysis    Component Value Date/Time   COLORURINE YELLOW (A) 06/25/2021 1404   APPEARANCEUR HAZY (A) 06/25/2021 1404   LABSPEC 1.028 06/25/2021 1404   PHURINE 5.0 06/25/2021 1404   GLUCOSEU NEGATIVE 06/25/2021 1404   HGBUR NEGATIVE 06/25/2021 1404   BILIRUBINUR NEGATIVE 06/25/2021 1404   KETONESUR NEGATIVE 06/25/2021 1404   PROTEINUR 30 (A) 06/25/2021 1404   NITRITE NEGATIVE 06/25/2021 1404   LEUKOCYTESUR LARGE (A) 06/25/2021 1404   Sepsis Labs Invalid input(s): PROCALCITONIN,  WBC,  LACTICIDVEN Microbiology Recent Results (from the past 240 hour(s))  Resp Panel by RT-PCR (Flu A&B, Covid) Nasopharyngeal Swab     Status: None   Collection Time: 07/10/21  5:00 PM   Specimen: Nasopharyngeal Swab; Nasopharyngeal(NP) swabs in vial transport medium  Result Value Ref Range Status   SARS Coronavirus 2 by RT PCR NEGATIVE NEGATIVE Final    Comment: (NOTE) SARS-CoV-2 target nucleic acids are NOT DETECTED.  The SARS-CoV-2 RNA is generally detectable in upper respiratory specimens during the acute phase of infection. The lowest concentration of SARS-CoV-2 viral copies this assay can detect is 138 copies/mL. A negative result does not preclude SARS-Cov-2 infection and should not be used as the sole basis for treatment or other patient management decisions. A negative result may occur with  improper specimen collection/handling, submission of specimen other than nasopharyngeal swab, presence of viral mutation(s) within the areas targeted by this assay, and inadequate number of viral copies(<138 copies/mL). A negative result must be combined with clinical observations, patient history, and epidemiological information. The expected  result is Negative.  Fact Sheet for Patients:  EntrepreneurPulse.com.au  Fact Sheet for Healthcare Providers:  IncredibleEmployment.be  This test is no t yet approved or cleared by the [place] FDA and  has been authorized for detection and/or diagnosis of SARS-CoV-2 by FDA under an Emergency Use Authorization (EUA). This EUA will remain  in effect (meaning this test can be used) for the duration of the COVID-19 declaration under Section 564(b)(1) of the Act, 21 U.S.C.section 360bbb-3(b)(1), unless the authorization is terminated  or revoked sooner.       Influenza A by PCR  NEGATIVE NEGATIVE Final   Influenza B by PCR NEGATIVE NEGATIVE Final    Comment: (NOTE) The Xpert Xpress SARS-CoV-2/FLU/RSV plus assay is intended as an aid in the diagnosis of influenza from Nasopharyngeal swab specimens and should not be used as a sole basis for treatment. Nasal washings and aspirates are unacceptable for Xpert Xpress SARS-CoV-2/FLU/RSV testing.  Fact Sheet for Patients: EntrepreneurPulse.com.au  Fact Sheet for Healthcare Providers: IncredibleEmployment.be  This test is not yet approved or cleared by the [place] FDA and has been authorized for detection and/or diagnosis of SARS-CoV-2 by FDA under an Emergency Use Authorization (EUA). This EUA will remain in effect (meaning this test can be used) for the duration of the COVID-19 declaration under Section 564(b)(1) of the Act, 21 U.S.C. section 360bbb-3(b)(1), unless the authorization is terminated or revoked.  Performed at [facility], [address] [place], [place] [address]      Time coordinating discharge: Over 30 minutes  SIGNED:   Sidney Ace, MD  [facility] 07/11/2021, 8:09 AM Pager   If 7PM-7AM, please contact night-coverage

## 2021-07-11 NOTE — TOC Transition Note (Signed)
Transition of Care [facility]) - CM/SW Discharge Note   Patient Details  Name: Desiree Mitchell MRN: [id number] Date of Birth: 1956-07-27  Transition of Care [facility]) CM/SW Contact:  Shelbie Hutching, RN Phone Number: 07/11/2021, 8:47 AM   Clinical Narrative:    Patient will discharge to [facility] today.  Bedside RN will call report to [phone].  Patient going to room 301 B.  RNCM has arranged EMS transport, patient is 4th on the list for pick up.  Daughter [name] notified of discharge today, she has already brought the patient up some pajamas to wear.     Final next level of care: Skilled Nursing Facility Barriers to Discharge: Barriers Resolved   Patient Goals and CMS Choice Patient states their goals for this hospitalization and ongoing recovery are:: Daughter wants short term rehab CMS Medicare.gov Compare Post Acute Care list provided to:: Patient Represenative (must comment) Choice offered to / list presented to : Adult Children  Discharge Placement              Patient chooses bed at: [facility] Patient to be transferred to facility by: [facility] Name of family member notified: [name]- daughter Patient and family notified of of transfer: 07/11/21  Discharge Plan and Services In-house Referral: Clinical Social Work Discharge Planning Services: CM Consult Post Acute Care Choice: [place]          DME Arranged: N/A DME Agency: NA       HH Arranged: NA HH Agency: NA        Social Determinants of Health (SDOH) Interventions     Readmission Risk Interventions No flowsheet data found.

## 2021-07-11 NOTE — Progress Notes (Incomplete)
SS 

## 2021-07-11 NOTE — Progress Notes (Signed)
Report given to [facility] at [facility]. All questions answered.

## 2021-07-13 ENCOUNTER — Encounter (HOSPITAL_COMMUNITY): Payer: Self-pay | Admitting: Radiology

## 2021-07-16 LAB — BLOOD GAS, ARTERIAL
Acid-base deficit: 1 mmol/L (ref 0.0–2.0)
Bicarbonate: 22.2 mmol/L (ref 20.0–28.0)
FIO2: 21
O2 Saturation: 96.3 %
Patient temperature: 37
pCO2 arterial: 32 mmHg (ref 32.0–48.0)
pH, Arterial: 7.45 (ref 7.350–7.450)
pO2, Arterial: 80 mmHg — ABNORMAL LOW (ref 83.0–108.0)

## 2021-12-27 ENCOUNTER — Emergency Department: Payer: Medicare (Managed Care)

## 2021-12-27 ENCOUNTER — Other Ambulatory Visit: Payer: Self-pay

## 2021-12-27 ENCOUNTER — Emergency Department
Admission: EM | Admit: 2021-12-27 | Discharge: 2021-12-27 | Disposition: A | Discharge status: Home / Self Care | Payer: Medicare (Managed Care) | Attending: Emergency Medicine | Admitting: Emergency Medicine

## 2021-12-27 ENCOUNTER — Encounter: Payer: Self-pay | Admitting: Emergency Medicine

## 2021-12-27 DIAGNOSIS — M7989 Other specified soft tissue disorders: Secondary | ICD-10-CM | POA: Insufficient documentation

## 2021-12-27 NOTE — ED Notes (Signed)
Called [facility] for transport to [facility]

## 2021-12-27 NOTE — ED Triage Notes (Signed)
Patient to ED via [facility] from [facility] for left arm swelling. Patient had PICC line removed approx 3 weeks ago. X-ray was completed at facility and showed "a catheter tip in the superior vena cava."

## 2021-12-27 NOTE — Discharge Instructions (Addendum)
Well-positioned right subclavian approach single-lumen power  injectable port catheter.  Please have patient follow up with primary care for further work up of any concerning arm swelling.

## 2021-12-27 NOTE — ED Notes (Signed)
Patient at [facility]

## 2021-12-27 NOTE — ED Provider Notes (Signed)
[facility] Provider Note    Event Date/Time   First MD Initiated Contact with Patient 12/27/21 1515     (approximate)   History   Arm Swelling   HPI  Desiree Mitchell is a 66 y.o. female  who, per discharge summary dated 07/11/21 has history of VP shunt, prior CVA who presents to the emergency department today via EMS because of apparent concern that patient had retained PICC line after reported removal 3 weeks ago. Apparently the patient started developing left arm swelling. X-ray was obtained which showed a catheter tip in the SVC, however unclear why they felt it was PICC related rather than related to patient's port. Patient herself cannot give any significant history. Does have swelling to her left arm.    Physical Exam   Triage Vital Signs: ED Triage Vitals  Enc Vitals Group     BP 12/27/21 1526 (!) 160/73     Pulse Rate 12/27/21 1526 67     Resp 12/27/21 1526 18     Temp 12/27/21 1526 97.9 F (36.6 C)     Temp Source 12/27/21 1526 Oral     SpO2 12/27/21 1526 97 %     Weight 12/27/21 1527 200 lb (90.7 kg)     Height 12/27/21 1527 '5\' 2"'$  (1.575 m)     Head Circumference --      Peak Flow --      Pain Score 12/27/21 1526 10     Pain Loc --      Pain Edu? --      Excl. in [place]? --     Most recent vital signs: Vitals:   12/27/21 1526  BP: (!) 160/73  Pulse: 67  Resp: 18  Temp: 97.9 F (36.6 C)  SpO2: 97%   General: Awake, alert and oriented. CV:  Good peripheral perfusion. Regular rate and rhythm. Resp:  Normal effort.  Abd:  No distention.  MSK:  Left arm swelling. No discoloration. Radial pulse 2+   ED Results / Procedures / Treatments   Labs (all labs ordered are listed, but only abnormal results are displayed) Labs Reviewed - No data to display   EKG  None   RADIOLOGY I independently interpreted and visualized the CXR. My interpretation: well positioned catheter Radiology interpretation:    IMPRESSION:   Well-positioned right subclavian approach single-lumen power  injectable port catheter.     Stable chronic bronchitic changes.   US venous left UE IMPRESSION:  No evidence of DVT within the left upper extremity.        PROCEDURES:  Critical Care performed: No  Procedures   MEDICATIONS ORDERED IN ED: Medications - No data to display   IMPRESSION / MDM / [place] / ED COURSE  I reviewed the triage vital signs and the nursing notes.                              Differential diagnosis includes, but is not limited to, retained catheter piece, port catheter, DVT.  Patient presented to the emergency department today because of concerns for left arm swelling and possibly retained piece of the catheter.  Chest x-ray was obtained and it does show a well-positioned port.  The patient left arm was negative for DVT on ultrasound.  No clinical findings concerning for arterial clot.  We will plan on discharge and to continue follow-up as outpatient.  FINAL CLINICAL IMPRESSION(S) / ED  DIAGNOSES   Final diagnoses:  Left arm swelling    Note:  This document was prepared using Dragon voice recognition software and may include unintentional dictation errors.    Nance Pear, MD 12/27/21 [phone]

## 2022-04-02 ENCOUNTER — Encounter (INDEPENDENT_AMBULATORY_CARE_PROVIDER_SITE_OTHER): Payer: Medicare (Managed Care) | Admitting: Vascular Surgery

## 2022-05-07 ENCOUNTER — Ambulatory Visit (INDEPENDENT_AMBULATORY_CARE_PROVIDER_SITE_OTHER): Payer: Medicare (Managed Care) | Admitting: Vascular Surgery

## 2022-05-07 ENCOUNTER — Other Ambulatory Visit (INDEPENDENT_AMBULATORY_CARE_PROVIDER_SITE_OTHER): Payer: Self-pay | Admitting: Vascular Surgery

## 2022-05-07 ENCOUNTER — Other Ambulatory Visit (INDEPENDENT_AMBULATORY_CARE_PROVIDER_SITE_OTHER): Payer: Medicare (Managed Care)

## 2022-05-07 VITALS — BP 135/75 | HR 76 | Resp 22 | Ht 63.0 in | Wt 200.0 lb

## 2022-05-07 DIAGNOSIS — I890 Lymphedema, not elsewhere classified: Secondary | ICD-10-CM | POA: Diagnosis not present

## 2022-05-07 DIAGNOSIS — I10 Essential (primary) hypertension: Secondary | ICD-10-CM

## 2022-05-07 DIAGNOSIS — R531 Weakness: Secondary | ICD-10-CM | POA: Diagnosis not present

## 2022-05-07 DIAGNOSIS — I639 Cerebral infarction, unspecified: Secondary | ICD-10-CM | POA: Diagnosis not present

## 2022-05-07 DIAGNOSIS — M7989 Other specified soft tissue disorders: Secondary | ICD-10-CM

## 2022-05-07 NOTE — Assessment & Plan Note (Signed)
Quite debilitated and her functional status is quite poor.

## 2022-05-07 NOTE — Assessment & Plan Note (Signed)
The patient has marked left arm swelling with a history of a left mastectomy for breast cancer and clearly has lymphedema on that side.  We did a DVT study today to ensure that she did not have a blood clot and none was seen.  She does have a port on the right side.  Her lymphedema is prominent and she needs a compression wrap or compression sleeve for her left arm.  Increasing activity in that left arm would also be of benefit.  A lymphedema pump for the arm may also be an option that would be of help.  I also believe a referral to the lymphedema clinic would be helpful to try to get her swelling under better control as manual massage may be of benefit.  Given her debilitated state and nursing home and her minimal mobility this is likely always going to have a significant amount of swelling.

## 2022-05-07 NOTE — Assessment & Plan Note (Signed)
Immobility on her left side makes her swelling worse.

## 2022-05-07 NOTE — Assessment & Plan Note (Signed)
blood pressure control important in reducing the progression of atherosclerotic disease. On appropriate oral medications.  

## 2022-05-07 NOTE — Progress Notes (Signed)
Patient ID: Desiree Mitchell, female   DOB: 1956-04-04, 66 y.o.   MRN: [id number]  Chief Complaint  Patient presents with   Establish Care    Referred by [facility]    HPI Desiree Mitchell is a 66 y.o. female.  I am asked to see the patient by [facility] for evaluation of left arm swelling.  The patient resides in a skilled nursing facility and is a very poor historian.  When asked how long her arms been swelling she said 2 days even though the referral was put in 2 months ago.  It is difficult to assess if she has any pain there.  The left arm is markedly swollen and she has had a mastectomy on that side in the remote past although she said it was just a few weeks ago.  She is also had a stroke and has a VP shunt and is followed by neurosurgery there is no weeping of the tissue.  There is no erythema or signs of infection.  No open wounds.  The swelling is prominent.  She is nonambulatory and its not clear how much she actually uses this left arm or her right arm for that matter.     Past Medical History:  Diagnosis Date   Asthma    Breast cancer ([place])    Hydrocephalus ([place])    Hypertension    Stroke [facility])     Past Surgical History:  Procedure Laterality Date   BRAIN SURGERY     stent in head   SHUNT REMOVAL Left 05/21/2021   Procedure: SHUNT REMOVAL;  Surgeon: Meade Maw, MD;  Location: [facility] ORS;  Service: Neurosurgery;  Laterality: Left;   TEE WITHOUT CARDIOVERSION N/A 05/29/2021   Procedure: TRANSESOPHAGEAL ECHOCARDIOGRAM (TEE);  Surgeon: Corey Skains, MD;  Location: [facility] ORS;  Service: Cardiovascular;  Laterality: N/A;   VENTRICULOSTOMY Left 05/21/2021   Procedure: VENTRICULOSTOMY;  Surgeon: Meade Maw, MD;  Location: [facility] ORS;  Service: Neurosurgery;  Laterality: Left;     Family History  Problem Relation Age of Onset   Cancer Mother    Diabetes Father    Cancer Father      Social History   Tobacco Use   Smoking status: Former     Types: Cigarettes   Smokeless tobacco: Former  Substance Use Topics   Alcohol use: No  Lives in a nursing facility  Allergies  Allergen Reactions   Tape     blisters    Current Outpatient Medications  Medication Sig Dispense Refill   amLODipine (NORVASC) 10 MG tablet Take 1 tablet (10 mg total) by mouth daily.     aspirin EC 81 MG EC tablet Take 1 tablet (81 mg total) by mouth daily. Swallow whole. 30 tablet 11   citalopram (CELEXA) 10 MG tablet Take 1 tablet (10 mg total) by mouth daily.     docusate sodium (COLACE) 100 MG capsule Take 1 capsule (100 mg total) by mouth 2 (two) times daily. 10 capsule 0   metoprolol tartrate (LOPRESSOR) 25 MG tablet Take 1 tablet (25 mg total) by mouth 2 (two) times daily.     pantoprazole (PROTONIX) 40 MG tablet Take 1 tablet (40 mg total) by mouth daily.     vitamin B-12 1000 MCG tablet Take 1 tablet (1,000 mcg total) by mouth daily.     Vitamin D, Cholecalciferol, 25 MCG (1000 UT) TABS Take 1 tablet by mouth daily.     cloNIDine (CATAPRES) 0.2  MG tablet Take 1 tablet (0.2 mg total) by mouth 2 (two) times daily. 60 tablet 0   No current facility-administered medications for this visit.      REVIEW OF SYSTEMS (Negative unless checked) Difficult to obtain.  Patient is a very poor historian and no family is present    Physical Exam BP 135/75 (BP Location: Right Arm)   Pulse 76   Resp (!) 22   Ht '5\' 3"'$  (1.6 m)   Wt 200 lb (90.7 kg)   BMI 35.43 kg/m  Gen:  WD/WN, NAD, debilitated and appears older than stated age. Head: [place]/AT, No temporalis wasting.  Ear/Nose/Throat: Hearing grossly intact, nares w/o erythema or drainage, oropharynx w/o Erythema/Exudate Eyes: Conjunctiva clear, sclera non-icteric  Neck: trachea midline.  No JVD.  Pulmonary:  Good air movement, respirations not labored, no use of accessory muscles  Cardiac: irregular Vascular:  Vessel Right Left  Radial Palpable Palpable                                    Gastrointestinal:. No masses, surgical incisions, or scars. Musculoskeletal: Hard to assess strength and tone. Left arm is markedly swollen.  Right arm and legs are mildly swollen. Neurologic: Difficult to assess. Psychiatric: Judgment and insight appear extremely poor.  Patient is a very poor historian and no family is present Dermatologic: No rashes or ulcers noted.  No cellulitis or open wounds.    Radiology No results found.  Labs No results found for this or any previous visit (from the past 2160 hour(s)).  Assessment/Plan:  Lymphedema The patient has marked left arm swelling with a history of a left mastectomy for breast cancer and clearly has lymphedema on that side.  We did a DVT study today to ensure that she did not have a blood clot and none was seen.  She does have a port on the right side.  Her lymphedema is prominent and she needs a compression wrap or compression sleeve for her left arm.  Increasing activity in that left arm would also be of benefit.  A lymphedema pump for the arm may also be an option that would be of help.  I also believe a referral to the lymphedema clinic would be helpful to try to get her swelling under better control as manual massage may be of benefit.  Given her debilitated state and nursing home and her minimal mobility this is likely always going to have a significant amount of swelling.  CVA (cerebral vascular accident) Quite debilitated and her functional status is quite poor.  HTN (hypertension) blood pressure control important in reducing the progression of atherosclerotic disease. On appropriate oral medications.   Weakness of left side of body Immobility on her left side makes her swelling worse.      [name] 05/07/2022, 4:06 PM   This note was created with Dragon medical transcription system.  Any errors from dictation are unintentional.

## 2022-06-10 ENCOUNTER — Encounter (INDEPENDENT_AMBULATORY_CARE_PROVIDER_SITE_OTHER): Payer: Self-pay

## 2022-07-10 ENCOUNTER — Inpatient Hospital Stay: Payer: Medicare Other

## 2022-07-10 ENCOUNTER — Inpatient Hospital Stay: Payer: Medicare Other | Admitting: Occupational Therapy

## 2022-07-10 ENCOUNTER — Inpatient Hospital Stay: Payer: Medicare Other | Attending: Internal Medicine | Admitting: Internal Medicine

## 2022-07-10 VITALS — BP 124/68 | Temp 97.9°F | Resp 18 | Wt 209.4 lb

## 2022-07-10 DIAGNOSIS — Z79899 Other long term (current) drug therapy: Secondary | ICD-10-CM | POA: Insufficient documentation

## 2022-07-10 DIAGNOSIS — Z9013 Acquired absence of bilateral breasts and nipples: Secondary | ICD-10-CM | POA: Insufficient documentation

## 2022-07-10 DIAGNOSIS — Z8673 Personal history of transient ischemic attack (TIA), and cerebral infarction without residual deficits: Secondary | ICD-10-CM | POA: Insufficient documentation

## 2022-07-10 DIAGNOSIS — Z7982 Long term (current) use of aspirin: Secondary | ICD-10-CM | POA: Diagnosis not present

## 2022-07-10 DIAGNOSIS — Z853 Personal history of malignant neoplasm of breast: Secondary | ICD-10-CM | POA: Diagnosis not present

## 2022-07-10 DIAGNOSIS — I890 Lymphedema, not elsewhere classified: Secondary | ICD-10-CM | POA: Diagnosis not present

## 2022-07-10 DIAGNOSIS — Z87891 Personal history of nicotine dependence: Secondary | ICD-10-CM | POA: Diagnosis not present

## 2022-07-10 NOTE — Therapy (Addendum)
[facility] at [facility], [place], [place], [address] Phone: [phone]   Fax:  [phone]  Occupational Therapy Screen:  Patient Details  Name: Desiree Mitchell MRN: [id number] Date of Birth: 05-14-1956 No data recorded  Encounter Date: 07/10/2022   OT End of Session - 07/10/22 1944     Visit Number 0             Past Medical History:  Diagnosis Date   Asthma    Breast cancer ([place])    Hydrocephalus ([place])    Hypertension    Stroke [facility])     Past Surgical History:  Procedure Laterality Date   BRAIN SURGERY     stent in head   SHUNT REMOVAL Left 05/21/2021   Procedure: SHUNT REMOVAL;  Surgeon: Desiree Maw, MD;  Location: [facility] ORS;  Service: Neurosurgery;  Laterality: Left;   TEE WITHOUT CARDIOVERSION N/A 05/29/2021   Procedure: TRANSESOPHAGEAL ECHOCARDIOGRAM (TEE);  Surgeon: Desiree Skains, MD;  Location: [facility] ORS;  Service: Cardiovascular;  Laterality: N/A;   VENTRICULOSTOMY Left 05/21/2021   Procedure: VENTRICULOSTOMY;  Surgeon: Desiree Maw, MD;  Location: [facility] ORS;  Service: Neurosurgery;  Laterality: Left;    There were no vitals filed for this visit.   Subjective Assessment - 07/10/22 1942     Subjective  I do not have any compression or had in the past on my arm    Currently in Pain? Yes    Pain Score --   no nr provided - said a lot when tried to move fingers, wrist ,elbow and shoulder   Pain Location Arm    Pain Orientation Left    Pain Type Chronic pain             ASSESSMENT AND PLAN DR Desiree Mitchell 07/10/22:   Desiree Mitchell is a 66 y.o. female bedbound, breast cancer, hydrocephalus, hypertension and stroke presented via EMS transport from [facility] facility for management of left-sided upper extremity lymphedema.   History obtained from Dr. Beverly Mitchell note from 2013. 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 She was started on neoadjuvant chemotherapy with Choksi Taxol and Cytoxan. She then underwent bilateral mastectomies and axillary lymph node dissection. Only granulation tissue and fibrosis was seen no active tumor was identified. 10 axillary lymph nodes were identified none showing metastatic disease. Patient has done well postoperatively.she has been presented at our tumor conference and recommendation for adjuvant radiation therapy was made. She is having no significant lymphedema of left upper extremity this time. Her mastectomy scars are well-healed."   Patient is a poor historian.  She was accompanied by her ride companion who had limited information.  They reported that patient developed left arm swelling yesterday.  However, on review of the chart shows that she has been having ongoing chronic left arm lymphedema.  She was seen by Dr. Lucky Mitchell on 05/07/2022.  In office ultrasound was performed which did not show any blood clot.  Plan was to refer her to lymphedema clinic for compression bandages or pump.  Patient is not connected to any clinic yet.   Discussed with the patient and companion that she does not need any further workup from breast cancer standpoint.  Her left arm lymphedema is a complication from her mastectomy surgery.  We were able to discuss the case with our occupational therapist Desiree Mitchell who will help connect the patient with right services considering the constraints with the transport and her mobility.   RTC as needed.   Patient expressed understanding and was in agreement with this plan. She also understands that She can call clinic at any time with any questions, concerns, or complaints.       OT SCREEN 07/10/22: Ask by Dr Desiree Mitchell to  assist with care with pt severe L UE lymphedema. Pt with sever stage 2 lymphedema in L UE - painfull with attempts of AAROM of digits, wrist ,elbow and L shoulder- pt report a lot. Pt lives at [facility] and bed ridden -came with EMS transportation. Pt with hx of bil mastectomy - uncertain when lymphedema started. Was seen earlier this year by vascular MD and refer to lymphedema clinic- but do not appear there was follow up. OT attempted to talk to [facility] transportation -but no answer. Was able to leave message with there rehab dept to ask to call me back to see if they are able to treat her L UE lymphedema. Await call back.    Friday 1st Dec 23 Talked by phone with [facility] director - she will discuss case with their OT -they treated lymphedema pt 's in the past  at the facility- if they need advice or help -will contact me.                                Visit Diagnosis: Lymphedema    Problem List Patient Active Problem List   Diagnosis Date Noted   History of breast cancer 07/10/2022   Lymphedema 05/07/2022   Pressure injury of left buttock, stage 4 ([place]) 07/03/2021   DNR (do not resuscitate) 06/09/2021   Palliative care by specialist    Acute embolic stroke ([place]) 52/84/1324   Sepsis ([place]) 05/22/2021   Endotracheally intubated 05/22/2021   On mechanically assisted ventilation ([place]) 05/22/2021   Shunt malfunction 05/22/2021   Rhabdomyolysis 05/22/2021   AKI (acute kidney injury) ([place]) 05/22/2021   Increased anion gap metabolic acidosis 40/05/2724   Acute encephalopathy 05/22/2021   Transaminitis 05/22/2021   Pressure injury of skin 05/22/2021   CVA (cerebral vascular accident) ([place]) 11/10/2012   Weakness of left side of body 11/05/2012   HTN (hypertension) 11/05/2012   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

## 2022-07-10 NOTE — Progress Notes (Signed)
[place]  [phone] [phone]) [phone]  ID: PSALM ARMAN OB: 14-Jun-1956  MR#: [id number]  CBJ#:[id number]  Patient Care Team: Ellene Route as PCP - General Patient, No Pcp Per (General Practice)  REFERRING PROVIDER: Dr. Jamal Collin  REASON FOR REFERRAL: History of breast cancer  HPI: Desiree Mitchell is a 66 y.o. female bedbound, breast cancer, hydrocephalus, hypertension and stroke presented via EMS transport from [facility] facility for management of left-sided upper extremity lymphedema.  History obtained from Dr. Beverly Gust note from 2013. 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 She was started on neoadjuvant chemotherapy with Choksi Taxol and Cytoxan. She then underwent bilateral mastectomies and axillary lymph node dissection. Only granulation tissue and fibrosis was seen no active tumor was identified. 10 axillary lymph nodes were identified none showing metastatic disease. Patient has done well postoperatively.she has been presented at our tumor conference and recommendation for adjuvant radiation therapy was made. She is having no significant lymphedema of left upper extremity this time. Her mastectomy scars are well-healed."  Patient is a poor historian.  She was accompanied by her ride companion who had limited information.  They reported that patient developed left arm swelling yesterday.  However, on review of the chart shows that she has been having ongoing chronic left arm lymphedema.  She was seen by Dr. Lucky Cowboy on 05/07/2022.  In office ultrasound was performed which did not show any blood clot.  Plan was to refer her to lymphedema clinic for  compression bandages or pump.  Patient is not connected to any clinic yet.   REVIEW OF SYSTEMS:   ROS  As per HPI. Otherwise, a complete review of systems is negative.  PAST MEDICAL HISTORY: Past Medical History:  Diagnosis Date   Asthma    Breast cancer ([place])    Hydrocephalus ([place])    Hypertension    Stroke ([place])     PAST SURGICAL HISTORY: Past Surgical History:  Procedure Laterality Date   BRAIN SURGERY     stent in head   SHUNT REMOVAL Left 05/21/2021   Procedure: SHUNT REMOVAL;  Surgeon: Meade Maw, MD;  Location: [facility] ORS;  Service: Neurosurgery;  Laterality: Left;   TEE WITHOUT CARDIOVERSION N/A 05/29/2021   Procedure: TRANSESOPHAGEAL ECHOCARDIOGRAM (TEE);  Surgeon: Corey Skains, MD;  Location: [facility] ORS;  Service: Cardiovascular;  Laterality: N/A;   VENTRICULOSTOMY Left 05/21/2021   Procedure: VENTRICULOSTOMY;  Surgeon: Meade Maw, MD;  Location: [facility] ORS;  Service: Neurosurgery;  Laterality: Left;    FAMILY HISTORY: Family History  Problem Relation Age of Onset   Cancer Mother    Diabetes Father    Cancer Father     HEALTH MAINTENANCE: Social History   Tobacco Use   Smoking status: Former    Types: Cigarettes   Smokeless tobacco: Former  Substance Use Topics   Alcohol use: No     Allergies  Allergen Reactions   Chlorhexidine Rash   Tape     blisters    Current Outpatient Medications  Medication Sig Dispense Refill   amLODipine (NORVASC) 10 MG tablet Take 1 tablet (10 mg total) by mouth daily.     aspirin EC 81 MG EC tablet Take 1 tablet (81 mg total) by mouth  daily. Swallow whole. 30 tablet 11   citalopram (CELEXA) 10 MG tablet Take 1 tablet (10 mg total) by mouth daily.     docusate sodium (COLACE) 100 MG capsule Take 1 capsule (100 mg total) by mouth 2 (two) times daily. 10 capsule 0   metoprolol tartrate (LOPRESSOR) 25 MG tablet Take 1 tablet (25 mg total) by mouth 2 (two) times daily.     pantoprazole (PROTONIX) 40 MG  tablet Take 1 tablet (40 mg total) by mouth daily.     vitamin B-12 1000 MCG tablet Take 1 tablet (1,000 mcg total) by mouth daily.     Vitamin D, Cholecalciferol, 25 MCG (1000 UT) TABS Take 1 tablet by mouth daily.     cloNIDine (CATAPRES) 0.2 MG tablet Take 1 tablet (0.2 mg total) by mouth 2 (two) times daily. 60 tablet 0   No current facility-administered medications for this visit.    OBJECTIVE: Vitals:   07/10/22 1113 07/10/22 1115  BP:  124/68  Resp: 18   Temp: 97.9 F (36.6 C)      Body mass index is 37.09 kg/m.      General: Well-developed, well-nourished, no acute distress. Lungs: Clear to auscultation bilaterally. Heart: Regular rate and rhythm. No rubs, murmurs, or gallops. Abdomen: Soft, nontender, nondistended. No organomegaly noted, normoactive bowel sounds. Musculoskeletal: Markedly swollen left arm with mild erythema, minimally swollen bilateral lower extremity and right arm. Neuro: Alert, answering all questions appropriately. Cranial nerves grossly intact. Skin: No rashes or petechiae noted. Psych: Normal affect.    LAB RESULTS:  Lab Results  Component Value Date   NA 142 07/07/2021   K 3.9 07/07/2021   CL 107 07/07/2021   CO2 27 07/07/2021   GLUCOSE 105 (H) 07/07/2021   BUN 27 (H) 07/07/2021   CREATININE 0.84 07/07/2021   CALCIUM 8.8 (L) 07/07/2021   PROT 6.7 07/07/2021   ALBUMIN 3.1 (L) 07/07/2021   AST 17 07/07/2021   ALT 18 07/07/2021   ALKPHOS 55 07/07/2021   BILITOT 0.5 07/07/2021   GFRNONAA >60 07/07/2021   GFRAA 74 (L) 11/10/2012    Lab Results  Component Value Date   WBC 10.9 (H) 07/07/2021   NEUTROABS 7.1 06/19/2021   HGB 12.9 07/07/2021   HCT 41.8 07/07/2021   MCV 98.1 07/07/2021   PLT 409 (H) 07/07/2021    No results found for: "TIBC", "FERRITIN", "IRONPCTSAT"   STUDIES: No results found.  ASSESSMENT AND PLAN:   AUBERY DATE is a 66 y.o. female bedbound, breast cancer, hydrocephalus, hypertension and stroke presented  via EMS transport from [facility] facility for management of left-sided upper extremity lymphedema.  History obtained from Dr. Beverly Gust note from 2013. 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 She was started on neoadjuvant chemotherapy with Choksi Taxol and Cytoxan. She then underwent bilateral mastectomies and axillary lymph node dissection. Only granulation tissue and fibrosis was seen no active tumor was identified. 10 axillary lymph nodes were identified none showing metastatic disease. Patient has done well postoperatively.she has been presented at our tumor conference and recommendation for adjuvant radiation therapy was made. She is having no significant lymphedema of left upper extremity this time. Her mastectomy scars are well-healed."  Patient is a poor historian.  She was accompanied by her ride companion who had limited information.  They reported that patient developed left arm swelling yesterday.  However, on review of the chart shows that she has been having ongoing chronic left arm lymphedema.  She was seen by Dr. Lucky Cowboy on 05/07/2022.  In office ultrasound was performed which did not show any blood clot.  Plan was to refer her to lymphedema clinic for compression bandages or pump.  Patient is not connected to any clinic yet.  Discussed with the patient and companion that she does not need any further workup from breast cancer standpoint.  Her left arm lymphedema is a complication from her mastectomy surgery.  We were able to discuss the case with our occupational therapist Gwenette Greet who will help connect the patient with right services considering the constraints with the transport and her  mobility.  RTC as needed.  Patient expressed understanding and was in agreement with this plan. She also understands that She can call clinic at any time with any questions, concerns, or complaints.   I spent a total of 45 minutes reviewing chart data, face-to-face evaluation with the patient, counseling and coordination of care as detailed above.  Jane Canary, MD   07/10/2022 12:19 PM

## 2022-07-10 NOTE — Progress Notes (Signed)
Patient here today for initial evaluation regarding history of breast cancer. Patient reports bilateral mastectomy. Patient with swelling to left arm that has been worked up by facility with no cause at this time. Patient has port in place, placed in 2012. Patient reports port has not been flushed or maintained at this time.

## 2022-08-23 ENCOUNTER — Encounter: Payer: Self-pay | Admitting: Family

## 2022-08-23 ENCOUNTER — Encounter: Payer: Self-pay | Admitting: Pharmacy Technician

## 2022-08-23 ENCOUNTER — Ambulatory Visit: Payer: Medicare Other | Attending: Family | Admitting: Family

## 2022-08-23 VITALS — BP 129/78 | HR 76 | Resp 18

## 2022-08-23 DIAGNOSIS — I110 Hypertensive heart disease with heart failure: Secondary | ICD-10-CM | POA: Insufficient documentation

## 2022-08-23 DIAGNOSIS — Z853 Personal history of malignant neoplasm of breast: Secondary | ICD-10-CM

## 2022-08-23 DIAGNOSIS — C50919 Malignant neoplasm of unspecified site of unspecified female breast: Secondary | ICD-10-CM | POA: Insufficient documentation

## 2022-08-23 DIAGNOSIS — J45909 Unspecified asthma, uncomplicated: Secondary | ICD-10-CM | POA: Diagnosis not present

## 2022-08-23 DIAGNOSIS — Z8673 Personal history of transient ischemic attack (TIA), and cerebral infarction without residual deficits: Secondary | ICD-10-CM | POA: Insufficient documentation

## 2022-08-23 DIAGNOSIS — I10 Essential (primary) hypertension: Secondary | ICD-10-CM

## 2022-08-23 DIAGNOSIS — I5032 Chronic diastolic (congestive) heart failure: Secondary | ICD-10-CM | POA: Diagnosis present

## 2022-08-23 DIAGNOSIS — Z87891 Personal history of nicotine dependence: Secondary | ICD-10-CM | POA: Diagnosis not present

## 2022-08-23 DIAGNOSIS — Z7401 Bed confinement status: Secondary | ICD-10-CM | POA: Insufficient documentation

## 2022-08-23 DIAGNOSIS — I890 Lymphedema, not elsewhere classified: Secondary | ICD-10-CM | POA: Diagnosis not present

## 2022-08-23 NOTE — Progress Notes (Signed)
[place] - PHARMACIST COUNSELING NOTE  Guideline-Directed Medical Therapy/Evidence Based Medicine  ACE/ARB/ARNI:  None Beta Blocker: Metoprolol tartrate 25 mg twice daily Aldosterone Antagonist:  None Diuretic:  None SGLT2i:  None  Adherence Assessment  Do you ever forget to take your medication? '[]'$ Yes '[x]'$ No  Do you ever skip doses due to side effects? '[]'$ Yes '[x]'$ No  Do you have trouble affording your medicines? '[]'$ Yes '[x]'$ No  Are you ever unable to pick up your medication due to transportation difficulties? '[]'$ Yes '[x]'$ No  Do you ever stop taking your medications because you don't believe they are helping? '[]'$ Yes '[x]'$ No  Do you check your weight daily? '[]'$ Yes '[]'$ No '[x]'$ Unknown   Adherence strategy: Resident at SNF who administers her medications  Barriers to obtaining medications: None  Vital signs: HR 76, BP 129/78, weight (pounds) not taken ECHO: Date 05/2021, EF 50-55%     Latest Ref Rng & Units 07/07/2021    9:10 AM 06/16/2021    4:55 AM 06/13/2021    6:40 AM  BMP  Glucose 70 - 99 mg/dL 105  108  124   BUN 8 - 23 mg/dL '27  25  25   '$ Creatinine 0.44 - 1.00 mg/dL 0.84  0.84  0.93   Sodium 135 - 145 mmol/L 142  144  142   Potassium 3.5 - 5.1 mmol/L 3.9  3.8  4.1   Chloride 98 - 111 mmol/L 107  109  107   CO2 22 - 32 mmol/L '27  28  29   '$ Calcium 8.9 - 10.3 mg/dL 8.8  8.2  8.1     Past Medical History:  Diagnosis Date   Asthma    Breast cancer (HCC)    CHF (congestive heart failure) ([place])    Hydrocephalus (HCC)    Hypertension    Stroke [facility])     ASSESSMENT 67 year old female with PMH HTN, CVA (2014), VP shunt, ventriculostomy who presents to the HF clinic for follow-up. Patient is resident at [facility] who administers her medications. Most recent ECHO from 05/2021 shows EF 50-55%. Patient takes metoprolol tartrate 25 mg twice daily.  Recent ED Visit (past 6 months): None  PLAN **Patient is a very poor historian so medication  reconciliation was performed using MAR from SNF.**  CHF/HTN Recommend new ECHO as last one is from 05/2021. Continue amlodopine and Lopressor.  Time spent: 10 minutes  Will M. Ouida Sills, PharmD PGY-1 Pharmacy Resident 08/23/2022 3:45 PM    Current Outpatient Medications:    amLODipine (NORVASC) 10 MG tablet, Take 1 tablet (10 mg total) by mouth daily., Disp: , Rfl:    aspirin EC 81 MG EC tablet, Take 1 tablet (81 mg total) by mouth daily. Swallow whole., Disp: 30 tablet, Rfl: 11   citalopram (CELEXA) 10 MG tablet, Take 1 tablet (10 mg total) by mouth daily. (Patient taking differently: Take 5 mg by mouth daily.), Disp: , Rfl:    cloNIDine (CATAPRES) 0.2 MG tablet, Take 1 tablet (0.2 mg total) by mouth 2 (two) times daily. (Patient not taking: Reported on 08/23/2022), Disp: 60 tablet, Rfl: 0   docusate sodium (COLACE) 100 MG capsule, Take 1 capsule (100 mg total) by mouth 2 (two) times daily., Disp: 10 capsule, Rfl: 0   metoprolol tartrate (LOPRESSOR) 25 MG tablet, Take 1 tablet (25 mg total) by mouth 2 (two) times daily., Disp: , Rfl:    pantoprazole (PROTONIX) 40 MG tablet, Take 1 tablet (40 mg total) by mouth daily., Disp: ,  Rfl:    vitamin B-12 1000 MCG tablet, Take 1 tablet (1,000 mcg total) by mouth daily., Disp: , Rfl:    Vitamin D, Cholecalciferol, 25 MCG (1000 UT) TABS, Take 2 tablets by mouth daily., Disp: , Rfl:    DRUGS TO CAUTION IN HEART FAILURE  Drug or Class Mechanism  Analgesics NSAIDs COX-2 inhibitors Glucocorticoids  Sodium and water retention, increased systemic vascular resistance, decreased response to diuretics   Diabetes Medications Metformin Thiazolidinediones Rosiglitazone (Avandia) Pioglitazone (Actos) DPP4 Inhibitors Saxagliptin (Onglyza) Sitagliptin (Januvia)   Lactic acidosis Possible calcium channel blockade   Unknown  Antiarrhythmics Class I  Flecainide Disopyramide Class III Sotalol Other Dronedarone  Negative inotrope,  proarrhythmic   Proarrhythmic, beta blockade  Negative inotrope  Antihypertensives Alpha Blockers Doxazosin Calcium Channel Blockers Diltiazem Verapamil Nifedipine Central Alpha Adrenergics Moxonidine Peripheral Vasodilators Minoxidil  Increases renin and aldosterone  Negative inotrope    Possible sympathetic withdrawal  Unknown  Anti-infective Itraconazole Amphotericin B  Negative inotrope Unknown  Hematologic Anagrelide Cilostazol   Possible inhibition of PD IV Inhibition of PD III causing arrhythmias  Neurologic/Psychiatric Stimulants Anti-Seizure Drugs Carbamazepine Pregabalin Antidepressants Tricyclics Citalopram Parkinsons Bromocriptine Pergolide Pramipexole Antipsychotics Clozapine Antimigraine Ergotamine Methysergide Appetite suppressants Bipolar Lithium  Peripheral alpha and beta agonist activity  Negative inotrope and chronotrope Calcium channel blockade  Negative inotrope, proarrhythmic Dose-dependent QT prolongation  Excessive serotonin activity/valvular damage Excessive serotonin activity/valvular damage Unknown  IgE mediated hypersensitivy, calcium channel blockade  Excessive serotonin activity/valvular damage Excessive serotonin activity/valvular damage Valvular damage  Direct myofibrillar degeneration, adrenergic stimulation  Antimalarials Chloroquine Hydroxychloroquine Intracellular inhibition of lysosomal enzymes  Urologic Agents Alpha Blockers Doxazosin Prazosin Tamsulosin Terazosin  Increased renin and aldosterone  Adapted from Page Carleene Overlie, et al. "Drugs That May Cause or Exacerbate Heart Failure: A Scientific Statement from the American Heart  Association." Circulation 2016; 134:e32-e69. DOI: 10.1161/CIR.0000000000000426   MEDICATION ADHERENCES TIPS AND STRATEGIES Taking medication as prescribed improves patient outcomes in heart failure (reduces hospitalizations, improves symptoms, increases survival) Side  effects of medications can be managed by decreasing doses, switching agents, stopping drugs, or adding additional therapy. Please let someone in the [facility] know if you have having bothersome side effects so we can modify your regimen. Do not alter your medication regimen without talking to [place].  Medication reminders can help patients remember to take drugs on time. If you are missing or forgetting doses you can try linking behaviors, using pill boxes, or an electronic reminder like an alarm on your phone or an app. Some people can also get automated phone calls as medication reminders.

## 2022-08-23 NOTE — Patient Instructions (Signed)
F/U here pending echo results

## 2022-08-23 NOTE — Progress Notes (Signed)
Patient ID: Desiree Mitchell, female    DOB: Oct 21, 1955, 67 y.o.   MRN: [id number]  HPI  [name] is a 67 y/o female with a history of asthma, HTN, stroke, breast cancer, hydrocephalus, previous tobacco use and chronic heart failure.   Echo 05/26/21 showed EF of 50-55%. TEE done 05/29/21  Has not been admitted or been in the ED in the last 6 months.   She presents today for her initial visit with a chief complaint of minimal fatigue upon moderate exertion. Describes this as chronic in nature. Has associated cough, pedal edema, left arm swelling and confusion along with this. Denies any difficulty sleeping, dizziness, abdominal distention, palpitations, chest pain or shortness of breath.   Has been bedridden for the last couple of years since her stroke 05/2021.  Past Medical History:  Diagnosis Date   Asthma    Breast cancer ([place])    CHF (congestive heart failure) ([place])    Hydrocephalus ([place])    Hypertension    Stroke [facility])    Past Surgical History:  Procedure Laterality Date   BRAIN SURGERY     stent in head   SHUNT REMOVAL Left 05/21/2021   Procedure: SHUNT REMOVAL;  Surgeon: Meade Maw, MD;  Location: [facility] ORS;  Service: Neurosurgery;  Laterality: Left;   TEE WITHOUT CARDIOVERSION N/A 05/29/2021   Procedure: TRANSESOPHAGEAL ECHOCARDIOGRAM (TEE);  Surgeon: Corey Skains, MD;  Location: [facility] ORS;  Service: Cardiovascular;  Laterality: N/A;   VENTRICULOSTOMY Left 05/21/2021   Procedure: VENTRICULOSTOMY;  Surgeon: Meade Maw, MD;  Location: [facility] ORS;  Service: Neurosurgery;  Laterality: Left;   Family History  Problem Relation Age of Onset   Cancer Mother    Diabetes Father    Cancer Father    Social History   Tobacco Use   Smoking status: Former    Types: Cigarettes   Smokeless tobacco: Former  Substance Use Topics   Alcohol use: No   Allergies  Allergen Reactions   Chlorhexidine Rash   Tape     blisters   Prior to Admission medications    Medication Sig Start Date End Date Taking? Authorizing Provider  amLODipine (NORVASC) 10 MG tablet Take 1 tablet (10 mg total) by mouth daily. 07/11/21  Yes Sreenath, Sudheer B, MD  aspirin EC 81 MG EC tablet Take 1 tablet (81 mg total) by mouth daily. Swallow whole. 07/11/21  Yes Sreenath, Sudheer B, MD  citalopram (CELEXA) 10 MG tablet Take 1 tablet (10 mg total) by mouth daily. Patient taking differently: Take 5 mg by mouth daily. 07/11/21  Yes Sreenath, Sudheer B, MD  docusate sodium (COLACE) 100 MG capsule Take 1 capsule (100 mg total) by mouth 2 (two) times daily. 07/11/21  Yes Sreenath, Sudheer B, MD  metoprolol tartrate (LOPRESSOR) 25 MG tablet Take 1 tablet (25 mg total) by mouth 2 (two) times daily. 07/11/21  Yes Sreenath, Sudheer B, MD  pantoprazole (PROTONIX) 40 MG tablet Take 1 tablet (40 mg total) by mouth daily. 07/11/21  Yes Sreenath, Sudheer B, MD  vitamin B-12 1000 MCG tablet Take 1 tablet (1,000 mcg total) by mouth daily. 07/11/21  Yes Sreenath, Sudheer B, MD  Vitamin D, Cholecalciferol, 25 MCG (1000 UT) TABS Take 2 tablets by mouth daily. 01/10/22  Yes [provider]  cloNIDine (CATAPRES) 0.2 MG tablet Take 1 tablet (0.2 mg total) by mouth 2 (two) times daily. Patient not taking: Reported on 08/23/2022 01/16/15 01/16/16  Arlyss Repress, PA-C  metoprolol succinate (TOPROL-XL) 50  MG 24 hr tablet Take 1 tablet (50 mg total) by mouth every morning. Take with or immediately following a meal. 04/06/13 01/16/15  Reyne Dumas, MD   Review of Systems  Constitutional:  Positive for fatigue. Negative for appetite change.  HENT:  Negative for congestion, postnasal drip and sore throat.   Eyes: Negative.   Respiratory:  Positive for cough. Negative for shortness of breath.   Cardiovascular:  Positive for leg swelling. Negative for chest pain and palpitations.  Gastrointestinal:  Negative for abdominal distention and abdominal pain.  Endocrine: Negative.   Genitourinary: Negative.    Musculoskeletal:  Negative for back pain and neck pain.       Left arm swelling for a few years  Skin: Negative.   Allergic/Immunologic: Negative.   Neurological:  Negative for dizziness and light-headedness.  Hematological:  Negative for adenopathy. Does not bruise/bleed easily.  Psychiatric/Behavioral:  Positive for confusion. Negative for sleep disturbance.    Vitals:   08/23/22 1318  BP: 129/78  Pulse: 76  Resp: 18   Wt Readings from Last 3 Encounters:  07/10/22 209 lb 6.4 oz (95 kg)  05/07/22 200 lb (90.7 kg)  12/27/21 200 lb (90.7 kg)   Lab Results  Component Value Date   CREATININE 0.84 07/07/2021   CREATININE 0.84 06/16/2021   CREATININE 0.93 06/13/2021   Physical Exam Vitals and nursing note reviewed. Exam conducted with a chaperone present (daughter and brother).  Constitutional:      Appearance: Normal appearance.  HENT:     Head: Normocephalic and atraumatic.  Cardiovascular:     Rate and Rhythm: Normal rate and regular rhythm.  Pulmonary:     Effort: Pulmonary effort is normal.     Breath sounds: No wheezing, rhonchi or rales.  Abdominal:     General: There is no distension.     Palpations: Abdomen is soft.     Tenderness: There is no abdominal tenderness.  Musculoskeletal:        General: Swelling (left arm (daughter says this has been present for a couple of years)) present.     Cervical back: Normal range of motion and neck supple.     Right lower leg: No edema.     Left lower leg: No edema.  Skin:    General: Skin is warm and dry.  Neurological:     Mental Status: She is alert. Mental status is at baseline.     Motor: Weakness present.  Psychiatric:        Mood and Affect: Mood normal.        Behavior: Behavior normal.   Assessment & Plan:  1: Chronic heart failure with preserved ejection fraction- - NYHA class II - euvolemic  - getting weighed but not daily at SNF due to patient needing hoyer lift - order written for SNF to obtain echo  and fax results to [place] - any med changes pending echo results  2: HTN- - BP 129/78 - seeing PCP at SNF - last BMP noted was 06/2021; order written for BMP on 08/27/22 & fax results to [place]  3: Breast cancer- - saw oncology Darrall Dears) 07/10/22  4: Lymphedema- - currently bedbound - no pedal edema  - left arm is swollen but daughter says that this has been chronic since her stroke   Facility medication list reviewed.   Due to patient being bedbound and having to come to appt via EMS, will not make a return appt at this time. Any changes in this  plan will be dependent on lab and echo results. Daughter was comfortable with this plan.

## 2023-10-30 ENCOUNTER — Telehealth: Payer: Self-pay

## 2023-10-30 ENCOUNTER — Other Ambulatory Visit: Payer: Self-pay

## 2023-10-30 NOTE — Telephone Encounter (Signed)
 Patient is scheduled to come tomorrow at 10:00 am here at the [facility], so I tried calling the patient's facility where it says in her chart that she resides at, [facility] in [place]), to see the reason for her coming in tomorrow. I spoke with one person and they transferred me over to B hall nurse, no one would answer the phone.

## 2023-10-31 ENCOUNTER — Inpatient Hospital Stay: Attending: Internal Medicine | Admitting: Internal Medicine

## 2023-10-31 ENCOUNTER — Inpatient Hospital Stay

## 2023-10-31 ENCOUNTER — Encounter: Payer: Self-pay | Admitting: Internal Medicine

## 2023-10-31 ENCOUNTER — Telehealth: Payer: Self-pay

## 2023-10-31 VITALS — BP 124/84 | HR 100 | Temp 97.0°F | Resp 14 | Wt 195.3 lb

## 2023-10-31 DIAGNOSIS — Z87891 Personal history of nicotine dependence: Secondary | ICD-10-CM | POA: Insufficient documentation

## 2023-10-31 DIAGNOSIS — Z9013 Acquired absence of bilateral breasts and nipples: Secondary | ICD-10-CM | POA: Insufficient documentation

## 2023-10-31 DIAGNOSIS — Z853 Personal history of malignant neoplasm of breast: Secondary | ICD-10-CM | POA: Insufficient documentation

## 2023-10-31 NOTE — Telephone Encounter (Signed)
 Patient was in clinic this morning, they didn't send the paper work that should have been with what they did bring, the labs medication and allergy list, ect... So I called the [facility] facility that she resides in to get all of that information faxed over to [place]. While on the phone with her nurse she told me that the reason for her visit was due to her having severe vaginal bleeding. They have set up her an appointment with a GYN but couldn't tell me what the exact date is. The nurse told me her vital signs from this morning, which I documented the weight since we couldn't get hers while she was in our clinic.

## 2023-10-31 NOTE — Progress Notes (Signed)
 [facility]  [phone] [phone]  ID: KANITA DELAGE OB: [birth year]  MR#: [id number]  KZS#:[id number]  Patient Care Team: Nira Retort as PCP - General Patient, No Pcp Per (General Practice)  REFERRING PROVIDER: Dr. Darrol Angel  REASON FOR REFERRAL: History of breast cancer  HPI: AALEIGHA BOZZA is a 68 y.o. female bedbound, breast cancer, hydrocephalus, hypertension and stroke presented via EMS transport from [facility] facility for management of left-sided upper extremity lymphedema.  History obtained from Dr. Darden Dates note from 2013. 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 She was started on neoadjuvant chemotherapy with Choksi Taxol and Cytoxan. She then underwent bilateral mastectomies and axillary lymph node dissection. Only granulation tissue and fibrosis was seen no active tumor was identified. 10 axillary lymph nodes were identified none showing metastatic disease. Patient has done well postoperatively.she has been presented at our tumor conference and recommendation for adjuvant radiation therapy was made. She is having no significant lymphedema of left upper extremity this time. Her mastectomy scars are well-healed."  Patient is a poor historian.  She was accompanied by her ride companion who had limited information.  They reported that patient developed left arm swelling yesterday.  However, on review of the chart shows that she has been having ongoing chronic left arm lymphedema.  She was seen by Dr. Wyn Quaker on 05/07/2022.  In office ultrasound was performed which did not show any blood clot.  Plan was to refer her to lymphedema clinic for  compression bandages or pump.  Patient is not connected to any clinic yet.  Recommended as needed follow-up.  10/31/2023-[facility] facility sent her back to medical oncology for vaginal bleeding.  There was no concerns in regards to breast cancer.  Interval history Patient was seen today accompanied with the caregiver.  In wheelchair. Reports vaginal bleeding for past few weeks.  Her left arm lymphedema is chronic and reports may be better from prior.  Has not followed with Dr. Wyn Quaker or has been connected to lymphedema clinic.   REVIEW OF SYSTEMS:   ROS  As per HPI. Otherwise, a complete review of systems is negative.  PAST MEDICAL HISTORY: Past Medical History:  Diagnosis Date   Asthma    Breast cancer (HCC)    CHF (congestive heart failure) (HCC)    Hydrocephalus (HCC)    Hypertension    Stroke (HCC)     PAST SURGICAL HISTORY: Past Surgical History:  Procedure Laterality Date   BRAIN SURGERY     stent in head   SHUNT REMOVAL Left 05/21/2021   Procedure: SHUNT REMOVAL;  Surgeon: Venetia Night, MD;  Location: [facility] ORS;  Service: Neurosurgery;  Laterality: Left;   TEE WITHOUT CARDIOVERSION N/A 05/29/2021   Procedure: TRANSESOPHAGEAL ECHOCARDIOGRAM (TEE);  Surgeon: Lamar Blinks, MD;  Location: [facility] ORS;  Service: Cardiovascular;  Laterality: N/A;   VENTRICULOSTOMY Left 05/21/2021   Procedure: VENTRICULOSTOMY;  Surgeon: Venetia Night, MD;  Location: [facility] ORS;  Service: Neurosurgery;  Laterality: Left;    FAMILY HISTORY: Family History  Problem Relation Age of Onset   Cancer Mother    Diabetes Father    Cancer Father     HEALTH MAINTENANCE: Social History   Tobacco Use   Smoking  status: Former    Types: Cigarettes   Smokeless tobacco: Former  Substance Use Topics   Alcohol use: No     Allergies  Allergen Reactions   Chlorhexidine Rash   Tape     blisters    Current Outpatient Medications  Medication Sig Dispense Refill   amLODipine  (NORVASC) 10 MG tablet Take 1 tablet (10 mg total) by mouth daily.     aspirin EC 81 MG EC tablet Take 1 tablet (81 mg total) by mouth daily. Swallow whole. 30 tablet 11   citalopram (CELEXA) 10 MG tablet Take 1 tablet (10 mg total) by mouth daily. (Patient taking differently: Take 5 mg by mouth daily.)     cloNIDine (CATAPRES) 0.2 MG tablet Take 1 tablet (0.2 mg total) by mouth 2 (two) times daily. (Patient not taking: Reported on 08/23/2022) 60 tablet 0   docusate sodium (COLACE) 100 MG capsule Take 1 capsule (100 mg total) by mouth 2 (two) times daily. 10 capsule 0   metoprolol tartrate (LOPRESSOR) 25 MG tablet Take 1 tablet (25 mg total) by mouth 2 (two) times daily.     pantoprazole (PROTONIX) 40 MG tablet Take 1 tablet (40 mg total) by mouth daily.     vitamin B-12 1000 MCG tablet Take 1 tablet (1,000 mcg total) by mouth daily.     Vitamin D, Cholecalciferol, 25 MCG (1000 UT) TABS Take 2 tablets by mouth daily.     No current facility-administered medications for this visit.    OBJECTIVE: Vitals:   10/31/23 0946  BP: 124/84  Pulse: 100  Resp: 14  Temp: (!) 97 F (36.1 C)  SpO2: 98%     There is no height or weight on file to calculate BMI.      General: Well-developed, well-nourished, no acute distress. Lungs: Clear to auscultation bilaterally. Heart: Regular rate and rhythm. No rubs, murmurs, or gallops. Abdomen: Soft, nontender, nondistended. No organomegaly noted, normoactive bowel sounds. Musculoskeletal: Markedly swollen left arm with mild erythema, minimally swollen bilateral lower extremity and right arm. Neuro: Alert, answering all questions appropriately. Cranial nerves grossly intact. Skin: No rashes or petechiae noted. Psych: Normal affect.    LAB RESULTS:  Lab Results  Component Value Date   NA 142 07/07/2021   K 3.9 07/07/2021   CL 107 07/07/2021   CO2 27 07/07/2021   GLUCOSE 105 (H) 07/07/2021   BUN 27 (H) 07/07/2021   CREATININE 0.84 07/07/2021    CALCIUM 8.8 (L) 07/07/2021   PROT 6.7 07/07/2021   ALBUMIN 3.1 (L) 07/07/2021   AST 17 07/07/2021   ALT 18 07/07/2021   ALKPHOS 55 07/07/2021   BILITOT 0.5 07/07/2021   GFRNONAA >60 07/07/2021   GFRAA 74 (L) 11/10/2012    Lab Results  Component Value Date   WBC 10.9 (H) 07/07/2021   NEUTROABS 7.1 06/19/2021   HGB 12.9 07/07/2021   HCT 41.8 07/07/2021   MCV 98.1 07/07/2021   PLT 409 (H) 07/07/2021    No results found for: "TIBC", "FERRITIN", "IRONPCTSAT"   STUDIES: No results found.  ASSESSMENT AND PLAN:   IZZABELLA BESSE is a 68 y.o. female bedbound, breast cancer, hydrocephalus, hypertension and stroke presented via EMS transport from [facility] facility for management of left-sided upper extremity lymphedema.  # History of breast cancer History obtained from Dr. Darden Dates note from 2013. "patient is a 68 year old female known to our Department having been treated 20 years prior for a infiltrating ductal carcinoma of the right breast early  stage disease. 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 She was started on neoadjuvant chemotherapy with Choksi Taxol and Cytoxan. She then underwent bilateral mastectomies and axillary lymph node dissection. Only granulation tissue and fibrosis was seen no active tumor was identified. 10 axillary lymph nodes were identified none showing metastatic disease. Patient has done well postoperatively.she has been presented at our tumor conference and recommendation for adjuvant radiation therapy was made. She is having no significant lymphedema of left upper extremity this time. Her mastectomy scars are well-healed."  Patient is a poor historian.  She was accompanied by her ride companion who had limited information.  They reported that patient developed left arm swelling yesterday.  However, on review of the chart shows that  she has been having ongoing chronic left arm lymphedema.  She was seen by Dr. Wyn Quaker on 05/07/2022.  In office ultrasound was performed which did not show any blood clot.  Plan was to refer her to lymphedema clinic for compression bandages or pump.  Patient is not connected to any clinic yet.  Discussed with the patient and companion that she does not need any further workup from breast cancer standpoint.  Her left arm lymphedema is a complication from her mastectomy surgery. Referred to Marisue Humble, OT  # Vaginal bleeding -Patient was sent back to medical oncology for vaginal bleeding.  Explained that this is not our forte.  Patient to be seen by OB/GYN.  Per facility, she has upcoming appointment with gynecologist.  RTC as needed.  Patient expressed understanding and was in agreement with this plan. She also understands that She can call clinic at any time with any questions, concerns, or complaints.   I spent a total of 45 minutes reviewing chart data, face-to-face evaluation with the patient, counseling and coordination of care as detailed above.  Michaelyn Barter, MD   10/31/2023 10:12 AM

## 2023-10-31 NOTE — Progress Notes (Signed)
 Ongoing for a little while now, when Desiree Mitchell Fail her care taker goes to bath her she notices that she starts to bleed from her private area, sometimes gushing out. She isn't in any pain.

## 2023-10-31 NOTE — Addendum Note (Signed)
 Addended by: Mardi Mainland on: 10/31/2023 11:16 AM   Modules accepted: Orders

## 2023-10-31 NOTE — Progress Notes (Signed)
 I created addendum so I could document the faxed over information from the facility she stays at, [facility].

## 2023-11-03 ENCOUNTER — Encounter: Payer: Self-pay | Admitting: Emergency Medicine

## 2023-11-03 ENCOUNTER — Emergency Department

## 2023-11-03 ENCOUNTER — Other Ambulatory Visit: Payer: Self-pay

## 2023-11-03 ENCOUNTER — Emergency Department
Admission: EM | Admit: 2023-11-03 | Discharge: 2023-11-04 | Disposition: A | Discharge status: Home / Self Care | Attending: Emergency Medicine | Admitting: Emergency Medicine

## 2023-11-03 DIAGNOSIS — I509 Heart failure, unspecified: Secondary | ICD-10-CM | POA: Insufficient documentation

## 2023-11-03 DIAGNOSIS — I110 Hypertensive heart disease with heart failure: Secondary | ICD-10-CM | POA: Insufficient documentation

## 2023-11-03 DIAGNOSIS — N939 Abnormal uterine and vaginal bleeding, unspecified: Secondary | ICD-10-CM | POA: Diagnosis present

## 2023-11-03 DIAGNOSIS — F0390 Unspecified dementia without behavioral disturbance: Secondary | ICD-10-CM | POA: Insufficient documentation

## 2023-11-03 DIAGNOSIS — N938 Other specified abnormal uterine and vaginal bleeding: Secondary | ICD-10-CM | POA: Insufficient documentation

## 2023-11-03 LAB — TYPE AND SCREEN
ABO/RH(D): AB POS
Antibody Screen: NEGATIVE

## 2023-11-03 LAB — COMPREHENSIVE METABOLIC PANEL
ALT: 12 U/L (ref 0–44)
AST: 18 U/L (ref 15–41)
Albumin: 3.2 g/dL — ABNORMAL LOW (ref 3.5–5.0)
Alkaline Phosphatase: 54 U/L (ref 38–126)
Anion gap: 10 (ref 5–15)
BUN: 10 mg/dL (ref 8–23)
CO2: 25 mmol/L (ref 22–32)
Calcium: 8.6 mg/dL — ABNORMAL LOW (ref 8.9–10.3)
Chloride: 104 mmol/L (ref 98–111)
Creatinine, Ser: 0.78 mg/dL (ref 0.44–1.00)
GFR, Estimated: >60 mL/min (ref >60)
Glucose, Bld: 133 mg/dL — ABNORMAL HIGH (ref 70–99)
Potassium: 3.8 mmol/L (ref 3.5–5.1)
Sodium: 139 mmol/L (ref 135–145)
Total Bilirubin: 0.4 mg/dL (ref 0.0–1.2)
Total Protein: 7.3 g/dL (ref 6.5–8.1)

## 2023-11-03 LAB — CBC
HCT: 40.7 % (ref 36.0–46.0)
Hemoglobin: 12.9 g/dL (ref 12.0–15.0)
MCH: 30.4 pg (ref 26.0–34.0)
MCHC: 31.7 g/dL (ref 30.0–36.0)
MCV: 95.8 fL (ref 80.0–100.0)
Platelets: 605 10*3/uL — ABNORMAL HIGH (ref 150–400)
RBC: 4.25 MIL/uL (ref 3.87–5.11)
RDW: 14.4 % (ref 11.5–15.5)
WBC: 15.3 10*3/uL — ABNORMAL HIGH (ref 4.0–10.5)
nRBC: 0 % (ref 0.0–0.2)

## 2023-11-03 NOTE — ED Provider Notes (Signed)
   [facility] Provider Note    Event Date/Time   First MD Initiated Contact with Patient 11/03/23 1812     (approximate)  History   Chief Complaint: Vaginal Bleeding  HPI  Desiree Mitchell is a 68 y.o. female with a past medical history of CHF, hypertension, prior CVA, dementia, presents to the emergency department from her nursing facility for concerns of rectal or vaginal bleeding.  According to report patient is coming from [facility] with a noted bleeding in the patient's brief unsure if this is vaginal or rectal bleeding.  Here the patient is awake alert she answers some simple questions.  Has baseline dementia.  Denies any pain.  Was able to tell us that she needs to pee.  Physical Exam   Triage Vital Signs: ED Triage Vitals [11/03/23 1236]  Encounter Vitals Group     BP 118/87     Systolic BP Percentile      Diastolic BP Percentile      Pulse Rate 84     Resp 18     Temp 98.6 F (37 C)     Temp Source Oral     SpO2 93 %     Weight 194 lb 0.1 oz (88 kg)     Height 5\' 3"  (1.6 m)     Head Circumference      Peak Flow      Pain Score 0     Pain Loc      Pain Education      Exclude from Growth Chart     Most recent vital signs: Vitals:   11/03/23 1236 11/03/23 1700  BP: 118/87 119/80  Pulse: 84 80  Resp: 18 18  Temp: 98.6 F (37 C) 98.5 F (36.9 C)  SpO2: 93% 95%    General: Awake, no distress.  CV:  Good peripheral perfusion.  Regular rate and rhythm  Resp:  Normal effort.  Equal breath sounds bilaterally.  Abd:  No distention.  Soft, nontender.  No reaction to palpation. Other:  Rectal examination shows light brown stool guaiac negative.  External pelvic exam shows a minimal amount of blood vaginally.   ED Results / Procedures / Treatments    MEDICATIONS ORDERED IN ED: Medications - No data to display   IMPRESSION / MDM / ASSESSMENT AND PLAN / ED COURSE  I reviewed the triage vital signs and the nursing  notes.  Patient's presentation is most consistent with acute presentation with potential threat to life or bodily function.  Patient presents to the emergency department for bleeding.  Rectal examination shows light brown stool guaiac negative external GU exam shows small amount of blood vaginally.  We will proceed with an ultrasound to further evaluate.  Patient's lab work today shows a reassuring H&H although slight leukocytosis, reassuring chemistry.  Patient's ultrasound has resulted showing a thickened endometrium to 39 mm concerning for possible malignancy.  I spoke to Dr. Dalbert Garnet of OB/GYN who recommends follow-up in the office for a biopsy.  I called the patient's daughter and informed her of the workup and recommendations.  Will discharge with outpatient follow-up back to her nursing facility.  FINAL CLINICAL IMPRESSION(S) / ED DIAGNOSES   Vaginal bleeding  Note:  This document was prepared using Dragon voice recognition software and may include unintentional dictation errors.   Minna Antis, MD 11/03/23 2203

## 2023-11-03 NOTE — Discharge Instructions (Addendum)
 Your workup in the emergency department tonight showed an abnormal ultrasound.  Please call the number provided tomorrow (Tuesday) for Dr. Dalbert Garnet of OB/GYN to arrange a follow-up appointment this week.  You may experience some intermittent vaginal bleeding, return to the emergency department for any very significant or heavy vaginal bleeding any pain or any other symptom concerning to yourself, otherwise please follow-up with Dr. Dalbert Garnet in her office.

## 2023-11-03 NOTE — ED Notes (Signed)
 [facility] call for transport to [facility], eta 20 minutes.

## 2023-11-03 NOTE — ED Triage Notes (Signed)
 Patient to ED via [facility] from [facility] for vaginal bleeding per pt. Ems reports states rectal bleeding. Hx of dementia- pt poor historian.

## 2023-11-03 NOTE — ED Triage Notes (Signed)
 First nurse note: Pt here from [facility] with c/o of rectal bleeding.   138/80 HR: 95

## 2023-11-12 ENCOUNTER — Telehealth: Payer: Self-pay

## 2023-11-12 NOTE — Telephone Encounter (Signed)
 I got a the following message from [place] this morning,  "do you know anything about GYN ref. for her? ", which I went and told her that yes I remember that patient she was coming in from [facility], she came in for vaginal bleeding. So Dr. Alena Bills wanted to have her referred to a GYN. Cordelia Pen told me that [facility] was were the referral was sent so they called [place] asking if she needed to go there or somewhere closer from her residence? I told Cordelia Pen that I would look into it, plus Dr. Alena Bills comes back in tomorrow so I will discuss this with her then.

## 2023-11-13 ENCOUNTER — Telehealth: Payer: Self-pay

## 2023-11-13 NOTE — Telephone Encounter (Signed)
 I spoke with Dr. Alena Bills in regards to this patient having a referral sent over to [facility], then [facility] calls [place] and asks why the patient can't just go back to the gyn to Dr. Dalbert Garnet, where she has already been before. Dr. Alena Bills said it was fine to send the GYN referral anywhere. So I faxed a referral over to the GYN here at [facility].

## 2024-03-05 DIAGNOSIS — F0390 Unspecified dementia without behavioral disturbance: Secondary | ICD-10-CM | POA: Insufficient documentation

## 2024-04-28 NOTE — Discharge Summary (Signed)
 RADIATION ONCOLOGY TREATMENT SUMMARY     Desiree Mitchell [id number]   Diagnosis: Endometrial cancer Histology:   Stage:          Please see consultation note dated 03/17/24 for full details of history and physical exam.     Simulation type/date: CT/Sim 04/14/24 -------------------------------------------------------------------------------------------------  Region Treated        Beam  Total      Days    Frac        Treatment Dates  & Technique            Energy  Dose                 Size         From   -   To  -------------------------------------------------------------------------------------------------- Pelvis, 2D                  15X      2000cGy   8      400cGy    04/20/24 - 04/27/24   Check all that apply: x Completed as planned   ___Unplanned Break  ___Unplanned admission   ___Did not complete ___Died during treatment      Clinical course and patient disposition:    Desiree Mitchell tolerated radiation therapy reasonably well with the expected degree of irritation associated with therapy.  She had no unexpected adverse effects of treatment.  Desiree Mitchell will return to [facility] for ongoing management in our clinic and/or their co-managing physicians.  Desiree Mitchell

## 2024-05-06 ENCOUNTER — Emergency Department

## 2024-05-06 ENCOUNTER — Other Ambulatory Visit: Payer: Self-pay

## 2024-05-06 ENCOUNTER — Emergency Department
Admission: EM | Admit: 2024-05-06 | Discharge: 2024-05-07 | Disposition: A | Discharge status: Skilled Nursing Facility | Attending: Emergency Medicine | Admitting: Emergency Medicine

## 2024-05-06 DIAGNOSIS — I509 Heart failure, unspecified: Secondary | ICD-10-CM | POA: Diagnosis not present

## 2024-05-06 DIAGNOSIS — D649 Anemia, unspecified: Secondary | ICD-10-CM | POA: Insufficient documentation

## 2024-05-06 DIAGNOSIS — R0902 Hypoxemia: Secondary | ICD-10-CM | POA: Diagnosis present

## 2024-05-06 DIAGNOSIS — F0390 Unspecified dementia without behavioral disturbance: Secondary | ICD-10-CM | POA: Insufficient documentation

## 2024-05-06 DIAGNOSIS — D72829 Elevated white blood cell count, unspecified: Secondary | ICD-10-CM | POA: Insufficient documentation

## 2024-05-06 DIAGNOSIS — Z8542 Personal history of malignant neoplasm of other parts of uterus: Secondary | ICD-10-CM | POA: Diagnosis not present

## 2024-05-06 DIAGNOSIS — Z853 Personal history of malignant neoplasm of breast: Secondary | ICD-10-CM | POA: Diagnosis not present

## 2024-05-06 DIAGNOSIS — J189 Pneumonia, unspecified organism: Secondary | ICD-10-CM

## 2024-05-06 DIAGNOSIS — Z8673 Personal history of transient ischemic attack (TIA), and cerebral infarction without residual deficits: Secondary | ICD-10-CM | POA: Diagnosis not present

## 2024-05-06 DIAGNOSIS — J168 Pneumonia due to other specified infectious organisms: Secondary | ICD-10-CM | POA: Insufficient documentation

## 2024-05-06 DIAGNOSIS — R7981 Abnormal blood-gas level: Secondary | ICD-10-CM

## 2024-05-06 DIAGNOSIS — I110 Hypertensive heart disease with heart failure: Secondary | ICD-10-CM | POA: Insufficient documentation

## 2024-05-06 LAB — CBC
HCT: 30.1 % — ABNORMAL LOW (ref 36.0–46.0)
Hemoglobin: 8.2 g/dL — ABNORMAL LOW (ref 12.0–15.0)
MCH: 23 pg — ABNORMAL LOW (ref 26.0–34.0)
MCHC: 27.2 g/dL — ABNORMAL LOW (ref 30.0–36.0)
MCV: 84.6 fL (ref 80.0–100.0)
Platelets: 405 K/uL — ABNORMAL HIGH (ref 150–400)
RBC: 3.56 MIL/uL — ABNORMAL LOW (ref 3.87–5.11)
RDW: 19.6 % — ABNORMAL HIGH (ref 11.5–15.5)
WBC: 10.6 K/uL — ABNORMAL HIGH (ref 4.0–10.5)
nRBC: 0 % (ref 0.0–0.2)

## 2024-05-06 LAB — BASIC METABOLIC PANEL WITH GFR
Anion gap: 12 (ref 5–15)
BUN: 15 mg/dL (ref 8–23)
CO2: 24 mmol/L (ref 22–32)
Calcium: 8.4 mg/dL — ABNORMAL LOW (ref 8.9–10.3)
Chloride: 102 mmol/L (ref 98–111)
Creatinine, Ser: 0.77 mg/dL (ref 0.44–1.00)
GFR, Estimated: >60 mL/min (ref >60)
Glucose, Bld: 137 mg/dL — ABNORMAL HIGH (ref 70–99)
Potassium: 4.3 mmol/L (ref 3.5–5.1)
Sodium: 138 mmol/L (ref 135–145)

## 2024-05-06 LAB — D-DIMER, QUANTITATIVE: D-Dimer, Quant: 1.16 ug{FEU}/mL — ABNORMAL HIGH (ref 0.00–0.50)

## 2024-05-06 LAB — TROPONIN I (HIGH SENSITIVITY): Troponin I (High Sensitivity): 3 ng/L (ref <18)

## 2024-05-06 MED ORDER — AMOXICILLIN-POT CLAVULANATE 875-125 MG PO TABS: 1.0000 | ORAL_TABLET | Freq: Two times a day (BID) | ORAL | 0 refills | Status: AC

## 2024-05-06 MED ORDER — HEPARIN SOD (PORK) LOCK FLUSH 100 UNIT/ML IV SOLN: 500.0000 [IU] | INTRAVENOUS | Status: AC | PRN

## 2024-05-06 MED ORDER — HEPARIN SOD (PORK) LOCK FLUSH 100 UNIT/ML IV SOLN
INTRAVENOUS | Status: AC
  Administered 2024-05-06: 500 [IU]
  Filled 2024-05-06: qty 5

## 2024-05-06 MED ORDER — SODIUM CHLORIDE 0.9 % IV SOLN
500.0000 mg | Freq: Once | INTRAVENOUS | Status: AC
  Administered 2024-05-06: 500 mg via INTRAVENOUS
  Filled 2024-05-06: qty 5

## 2024-05-06 MED ORDER — IOHEXOL 350 MG/ML SOLN
75.0000 mL | Freq: Once | INTRAVENOUS | Status: AC | PRN
  Administered 2024-05-06: 75 mL via INTRAVENOUS

## 2024-05-06 MED ORDER — AZITHROMYCIN 500 MG PO TABS: 500.0000 mg | ORAL_TABLET | Freq: Every day | ORAL | 0 refills | Status: AC

## 2024-05-06 MED ORDER — SODIUM CHLORIDE 0.9 % IV SOLN
2.0000 g | Freq: Once | INTRAVENOUS | Status: AC
  Administered 2024-05-06: 2 g via INTRAVENOUS
  Filled 2024-05-06: qty 20

## 2024-05-06 NOTE — Discharge Instructions (Signed)
 Your CT scan here did not show signs of a blood clot in your lung.  It did show a possible area of pneumonia.  Your blood work showed that your hemoglobin level is low, but not low enough to require blood transfusion.  I have included a prescription for 2 antibiotics to treat your possible lung infection.  Please follow-up with your outpatient oncology and primary care team to discuss further management of your symptoms.  Return to the ER for new or worsening symptoms.

## 2024-05-06 NOTE — ED Provider Notes (Signed)
 [facility] Provider Note    Event Date/Time   First MD Initiated Contact with Patient 05/06/24 1504     (approximate)   History   CT Scan   HPI  Desiree Mitchell is a 68 year old female with history of breast cancer, CHF, HTN, CVA, dementia, endometrial cancer presenting to the emergency department for evaluation of hypoxia.  Patient is unable to provide history, nonverbal at baseline.    Reviewed telemedicine visit from earlier today.  It appears that the nurse at patient's facility noted that the patient had new hypoxia.  Had a negative chest x-Calisa Luckenbaugh.  Case was discussed with nurse as well as patient's guardian, Mr. Jeri.  A CT was ordered to further evaluate for pulmonary embolism.  Of note, this does state she would likely not be a candidate for anticoagulation given her endometrial bleeding.  Plan to compare CT to prior to further determine plans for patient.  Reviewed office visit with gynecology oncology from 03/05/2024.  At that time patient was noted to have impaired functional status with medical comorbidities for which a short course of palliative radiation therapy was recommended.      Physical Exam   Triage Vital Signs: ED Triage Vitals  Encounter Vitals Group     BP 05/06/24 1415 (!) 106/52     Girls Systolic BP Percentile --      Girls Diastolic BP Percentile --      Boys Systolic BP Percentile --      Boys Diastolic BP Percentile --      Pulse Rate 05/06/24 1415 77     Resp 05/06/24 1415 18     Temp 05/06/24 1415 97.8 F (36.6 C)     Temp Source 05/06/24 1415 Oral     SpO2 05/06/24 1415 98 %     Weight 05/06/24 1413 160 lb (72.6 kg)     Height 05/06/24 1413 5' 5 (1.651 m)     Head Circumference --      Peak Flow --      Pain Score 05/06/24 1413 0     Pain Loc --      Pain Education --      Exclude from Growth Chart --     Most recent vital signs: Vitals:   05/06/24 1730 05/06/24 1839  BP: (!) 106/55   Pulse: 74   Resp: 17    Temp:  98 F (36.7 C)  SpO2: 99%      General: Awake, interactive  CV:  Regular rate, good peripheral perfusion.  Resp:  Unlabored respirations, lungs clear to auscultation Abd:  Nondistended.  Neuro:  No gross facial asymmetry,  able to nod yes or no appropriately to some basic questions   ED Results / Procedures / Treatments   Labs (all labs ordered are listed, but only abnormal results are displayed) Labs Reviewed  BASIC METABOLIC PANEL WITH GFR - Abnormal; Notable for the following components:      Result Value   Glucose, Bld 137 (*)    Calcium 8.4 (*)    All other components within normal limits  D-DIMER, QUANTITATIVE - Abnormal; Notable for the following components:   D-Dimer, Quant 1.16 (*)    All other components within normal limits  CBC - Abnormal; Notable for the following components:   WBC 10.6 (*)    RBC 3.56 (*)    Hemoglobin 8.2 (*)    HCT 30.1 (*)    MCH 23.0 (*)  MCHC 27.2 (*)    RDW 19.6 (*)    Platelets 405 (*)    All other components within normal limits  TROPONIN I (HIGH SENSITIVITY)     EKG EKG independently reviewed and interpreted by myself demonstrates:    RADIOLOGY Imaging independently reviewed and interpreted by myself demonstrates:  CTA chest without evidence of PE, bronchial wall thickening concerning for infection noted, new nodules noted from radiology comparison to 2022, but impression from CT in [facility] system from June reports both pulmonary nodules and thyroid nodules which I suspect correlate with findings on imaging today  Formal Radiology Read:  CT Angio Chest PE W and/or Wo Contrast Result Date: 05/06/2024 CLINICAL DATA:  High probability for PE. EXAM: CT ANGIOGRAPHY CHEST WITH CONTRAST TECHNIQUE: Multidetector CT imaging of the chest was performed using the standard protocol during bolus administration of intravenous contrast. Multiplanar CT image reconstructions and MIPs were obtained to evaluate the vascular anatomy.  RADIATION DOSE REDUCTION: This exam was performed according to the departmental dose-optimization program which includes automated exposure control, adjustment of the mA and/or kV according to patient size and/or use of iterative reconstruction technique. CONTRAST:  75mL OMNIPAQUE  IOHEXOL  350 MG/ML SOLN COMPARISON:  CT chest 05/21/2021. FINDINGS: Cardiovascular: There is adequate opacification of the pulmonary arteries. There is no evidence for pulmonary embolism. The heart is mildly enlarged. There is a small pericardial effusion. Aorta is normal in size. There are atherosclerotic calcifications of the aorta and coronary arteries. Right chest port catheter tip ends in the distal SVC. Mediastinum/Nodes: Exophytic inferior thyroid nodule measures 2.5 x 1.9 cm similar to the prior study. There are no enlarged mediastinal or hilar lymph nodes. Visualized esophagus is within normal limits. Lungs/Pleura: There is a new 2 mm right upper lobe nodule image 5/22. There is a new 4 mm lingular nodule image 5/53. There is a new 3 mm nodule in the left upper lobe image 5/31. There is a new 5 mm left lower lobe nodule image 5/76. There is bilateral lower lobe peribronchial wall thickening with some patchy ground-glass opacities in the bilateral lower lobes. There is no pleural effusion or pneumothorax. Upper Abdomen: No acute abnormality. Musculoskeletal: There is left axillary subcutaneous edema. No lymphadenopathy. No acute bony abnormality. Review of the MIP images confirms the above findings. IMPRESSION: 1. No evidence for pulmonary embolism. 2. Bilateral lower lobe peribronchial wall thickening with patchy ground-glass opacities in the bilateral lower lobes, likely infectious/inflammatory. 3. Multiple new pulmonary nodules measuring up to 5 mm. These are indeterminate. Metastatic disease not excluded. 4. Stable exophytic inferior thyroid nodule measuring 2.5 cm. Recommend thyroid US  (ref: J Am Coll Radiol. 2015 Feb;12(2):  143-50). 5. Left axillary subcutaneous edema. 6. Aortic atherosclerosis. Aortic Atherosclerosis (ICD10-I70.0). Electronically Signed   By: Greig Pique M.D.   On: 05/06/2024 17:38    PROCEDURES:  Critical Care performed: No  Procedures   MEDICATIONS ORDERED IN ED: Medications  cefTRIAXone  (ROCEPHIN ) 2 g in sodium chloride  0.9 % 100 mL IVPB (2 g Intravenous New Bag/Given 05/06/24 1839)  azithromycin  (ZITHROMAX ) 500 mg in sodium chloride  0.9 % 250 mL IVPB (has no administration in time range)  iohexol  (OMNIPAQUE ) 350 MG/ML injection 75 mL (75 mLs Intravenous Contrast Given 05/06/24 1630)     IMPRESSION / MDM / ASSESSMENT AND PLAN / ED COURSE  I reviewed the triage vital signs and the nursing notes.  Differential diagnosis includes, but is not limited to, pulmonary embolism, pneumonia, pleural effusion, pneumothorax, lower suspicion ACS  Patient's presentation  is most consistent with acute presentation with potential threat to life or bodily function.  68 year old female presenting with hypoxia, no apparent distress on exam but limited communication ability.  Labs with mild leukocytosis with WC of 10.6.  New anemia noted with hemoglobin of 8.2.  Patient has had significant recent endometrial bleeding for which she is receiving radiation oncology, the most recent CBC that I can see for her is from March at which time her hemoglobin was 12.9.  BMP without significant derangement.  Negative troponin.  D-dimer elevated.  CTA of the chest ordered to further evaluate.  CTA without evidence of PE.  Demonstrates pulmonary nodules and thyroid nodules that I suspect are not new as radiology report from June comments on these as well although I do not have the images to compare to.  There are inflammatory verse infectious changes noted.  Suspect possible pneumonia contributing to patient's any respiratory symptoms.  She was monitored here off of oxygen and was able to maintain her oxygen saturation, 99%  without increased work of breathing at time of my reevaluation.  I attempted to contact the patient's guardian as well as [facility] to update on them on the results of patient's workup as well as obtain additional information.  Unfortunately I was unable to get  an answer when contacting either.  Does not meet SIRS criteria.  Her port is already accessed, so we will give a dose of IV antibiotics here.  In reading her notes it is likely that she may transition to hospice.  She does have worsening anemia here, but no active bleeding and does not appear to be acutely symptomatic from this.  Given this, do think patient is stable for discharge.  Strict return precautions provided.     FINAL CLINICAL IMPRESSION(S) / ED DIAGNOSES   Final diagnoses:  Low oxygen saturation  Pneumonia due to infectious organism, unspecified laterality, unspecified part of lung     Rx / DC Orders   ED Discharge Orders          Ordered    amoxicillin -clavulanate (AUGMENTIN ) 875-125 MG tablet  2 times daily        05/06/24 1833    azithromycin  (ZITHROMAX ) 500 MG tablet  Daily        05/06/24 1833             Note:  This document was prepared using Dragon voice recognition software and may include unintentional dictation errors.   Levander Slate, MD 05/06/24 [phone]

## 2024-05-06 NOTE — Progress Notes (Signed)
--------------------------------------------------   Advance Care Planning Advance Care Planning Conversation  Pertinent Diagnosis/es: Endometrial cancer with new hypoxia  The patient and/or surrogate consented to a voluntary Advance Care Planning conversation. Individuals present for the conversation: Court Appointed Guardian: Tillman Fries and nurse at [facility] to Devetta Myers  Summary of the conversation: I received a call from Devetta Myers, nurse at the patient's facility that the patient had new hypoxia with a negative chest x-ray and her guardian was requesting further evaluation and wondered if a pulmonary consultation was to be appropriate.  I spoke with both Ms. Billy and Ms. Torain by phone today and recommended that the facility physician Dr. Antonetta order a CT of the chest to rule out both pulmonary embolism and underlying pathology given the patient's risk of blood clot with her recent diagnosis of endometrial cancer and concern over possible aspiration.  We discussed that should the patient have a pulmonary embolism she would likely not be a candidate for anticoagulation given her recent endometrial bleeding from her malignancy for which she just completed palliative radiation.  The plan is to obtain the CT scan and compare with her previous chest CT on March 05, 2024 to determine if any interventions would be appropriate as well as for consideration for transition to hospice.  Ms. Billy and Mr. Fries expressed agreement with this plan.   Outcome of the conversation and documents completed (select all that apply): Other: Plan for CT of the chest to evaluate for both pulmonary emboli as well as underlying pathology and then likely transition to hospice.  I spent 25 minutes providing separately identifiable ACP services with the patient and/or surrogate decision maker in a voluntary conversation discussing the patient's goals, values, and preferences as detailed in the note above.   ALISHA  BETH BENNER    ------------------------------------------------  This telephone encounter was conducted with the patient's (or proxy's) verbal consent via secure, interactive audio telecommunications while in clinic/office/hospital.  The patient (or proxy) was instructed to have this encounter in a suitably private space and to only have persons present to whom they give permission to participate. In addition, patient identity was confirmed by use of name plus an additional identifier.  05-14-2020 E&M) This visit was coded based on time. I spent a total of 40 minutes in both face-to-face and non-face-to-face activities for this visit on the date of this encounter.

## 2024-05-06 NOTE — ED Triage Notes (Signed)
 Pt arrived via EMS from [facility] for a CT scan of her lungs for a possible PE. Per EMS pt [facility] doctors advised facility to send pt out for the scan. Pt has no S/S or any complaints. Pt does have swelling to the left arm which is normal as well as small vaginal bleeding due to endometrium cancer with radiation yesterday at [email or web address] Pt is resting on ED stretcher with no complaints.

## 2024-05-07 NOTE — ED Notes (Signed)
 Pts brief changed, extra linens removed, peri care done

## 2024-09-11 ENCOUNTER — Emergency Department

## 2024-09-11 ENCOUNTER — Other Ambulatory Visit: Payer: Self-pay

## 2024-09-11 ENCOUNTER — Observation Stay
Admission: EM | Admit: 2024-09-11 | Discharge: 2024-09-12 | Disposition: A | Discharge status: Skilled Nursing Facility | Attending: Obstetrics and Gynecology | Admitting: Obstetrics and Gynecology

## 2024-09-11 DIAGNOSIS — I10 Essential (primary) hypertension: Secondary | ICD-10-CM | POA: Diagnosis present

## 2024-09-11 DIAGNOSIS — Z515 Encounter for palliative care: Secondary | ICD-10-CM | POA: Insufficient documentation

## 2024-09-11 DIAGNOSIS — R1011 Right upper quadrant pain: Principal | ICD-10-CM

## 2024-09-11 DIAGNOSIS — D649 Anemia, unspecified: Secondary | ICD-10-CM | POA: Diagnosis not present

## 2024-09-11 DIAGNOSIS — Z982 Presence of cerebrospinal fluid drainage device: Secondary | ICD-10-CM | POA: Insufficient documentation

## 2024-09-11 DIAGNOSIS — Z853 Personal history of malignant neoplasm of breast: Secondary | ICD-10-CM | POA: Diagnosis not present

## 2024-09-11 DIAGNOSIS — J45909 Unspecified asthma, uncomplicated: Secondary | ICD-10-CM | POA: Diagnosis not present

## 2024-09-11 DIAGNOSIS — N390 Urinary tract infection, site not specified: Principal | ICD-10-CM | POA: Diagnosis present

## 2024-09-11 DIAGNOSIS — Z87891 Personal history of nicotine dependence: Secondary | ICD-10-CM | POA: Diagnosis not present

## 2024-09-11 DIAGNOSIS — Z6835 Body mass index (BMI) 35.0-35.9, adult: Secondary | ICD-10-CM | POA: Insufficient documentation

## 2024-09-11 DIAGNOSIS — Z79899 Other long term (current) drug therapy: Secondary | ICD-10-CM | POA: Diagnosis not present

## 2024-09-11 DIAGNOSIS — I110 Hypertensive heart disease with heart failure: Secondary | ICD-10-CM | POA: Insufficient documentation

## 2024-09-11 DIAGNOSIS — K5641 Fecal impaction: Secondary | ICD-10-CM | POA: Diagnosis not present

## 2024-09-11 DIAGNOSIS — E669 Obesity, unspecified: Secondary | ICD-10-CM | POA: Diagnosis present

## 2024-09-11 DIAGNOSIS — I509 Heart failure, unspecified: Secondary | ICD-10-CM | POA: Insufficient documentation

## 2024-09-11 DIAGNOSIS — I639 Cerebral infarction, unspecified: Secondary | ICD-10-CM | POA: Diagnosis present

## 2024-09-11 DIAGNOSIS — R109 Unspecified abdominal pain: Secondary | ICD-10-CM

## 2024-09-11 DIAGNOSIS — C541 Malignant neoplasm of endometrium: Secondary | ICD-10-CM | POA: Diagnosis not present

## 2024-09-11 HISTORY — DX: Nontraumatic intracerebral hemorrhage, unspecified: I61.9

## 2024-09-11 HISTORY — DX: Gastro-esophageal reflux disease without esophagitis: K21.9

## 2024-09-11 HISTORY — DX: Disorder of thyroid, unspecified: E07.9

## 2024-09-11 HISTORY — DX: Constipation, unspecified: K59.00

## 2024-09-11 HISTORY — DX: Peripheral vascular disease, unspecified: I73.9

## 2024-09-11 HISTORY — DX: Hemiplegia, unspecified affecting left nondominant side: G81.94

## 2024-09-11 LAB — COMPREHENSIVE METABOLIC PANEL WITH GFR
ALT: <5 U/L (ref 0–44)
AST: 19 U/L (ref 15–41)
Albumin: 3.2 g/dL — ABNORMAL LOW (ref 3.5–5.0)
Alkaline Phosphatase: 73 U/L (ref 38–126)
Anion gap: 12 (ref 5–15)
BUN: 13 mg/dL (ref 8–23)
CO2: 24 mmol/L (ref 22–32)
Calcium: 8.7 mg/dL — ABNORMAL LOW (ref 8.9–10.3)
Chloride: 103 mmol/L (ref 98–111)
Creatinine, Ser: 0.86 mg/dL (ref 0.44–1.00)
GFR, Estimated: >60 mL/min
Glucose, Bld: 128 mg/dL — ABNORMAL HIGH (ref 70–99)
Potassium: 4.2 mmol/L (ref 3.5–5.1)
Sodium: 140 mmol/L (ref 135–145)
Total Bilirubin: 0.3 mg/dL (ref 0.0–1.2)
Total Protein: 7.6 g/dL (ref 6.5–8.1)

## 2024-09-11 LAB — CBC
HCT: 33.5 % — ABNORMAL LOW (ref 36.0–46.0)
Hemoglobin: 10.1 g/dL — ABNORMAL LOW (ref 12.0–15.0)
MCH: 26 pg (ref 26.0–34.0)
MCHC: 30.1 g/dL (ref 30.0–36.0)
MCV: 86.3 fL (ref 80.0–100.0)
Platelets: 649 10*3/uL — ABNORMAL HIGH (ref 150–400)
RBC: 3.88 MIL/uL (ref 3.87–5.11)
RDW: 16.3 % — ABNORMAL HIGH (ref 11.5–15.5)
WBC: 13.2 10*3/uL — ABNORMAL HIGH (ref 4.0–10.5)
nRBC: 0 % (ref 0.0–0.2)

## 2024-09-11 LAB — LIPASE, BLOOD: Lipase: 24 U/L (ref 11–51)

## 2024-09-11 MED ORDER — IOHEXOL 300 MG/ML  SOLN
100.0000 mL | Freq: Once | INTRAMUSCULAR | Status: AC | PRN
  Administered 2024-09-11: 100 mL via INTRAVENOUS

## 2024-09-11 NOTE — ED Triage Notes (Signed)
 First Nurse Note:  Pt via [facility] from [facility]. Pt c/o RUQ abd pain, states she is usually non-verbal but today pt started yelling for help. Pt has a hx of endometrial cancer. EMS gave bolus. Pt is alert but non-verbal at baseline.   EMS reports:  100.4 axillary, 115 HR, 25 RR, 152/88 BP. 187 CBG

## 2024-09-11 NOTE — ED Notes (Signed)
 Patient transported to CT

## 2024-09-11 NOTE — ED Triage Notes (Signed)
 See first nurse note. To ED from [facility] for abdominal pain. Pt is nonverbal at baseline. Pt endorses RUQ pain with palpation by nodding head in triage. EMS 20# IV R AC, 500mL NS given PTA.

## 2024-09-11 NOTE — ED Provider Notes (Signed)
 "  [facility] Provider Note    Event Date/Time   First MD Initiated Contact with Patient 09/11/24 2038     (approximate)   History   Chief Complaint Abdominal Pain   HPI  Desiree Mitchell is a 69 y.o. female with past medical history of hypertension, asthma, stroke with left hemiparesis, and endometrial cancer who presents to the ED complaining of abdominal pain.  History is limited due to patient being nonverbal following previous stroke.  Per EMS, patient indicated to staff at her nursing facility that she has been dealing with pain in her abdomen.  Patient unable to provide any other details at this time, only points at her abdomen when asked if she is in pain.     Physical Exam   Triage Vital Signs: ED Triage Vitals  Encounter Vitals Group     BP 09/11/24 1608 (!) 104/54     Girls Systolic BP Percentile --      Girls Diastolic BP Percentile --      Boys Systolic BP Percentile --      Boys Diastolic BP Percentile --      Pulse Rate 09/11/24 1608 92     Resp 09/11/24 1608 18     Temp 09/11/24 1608 98.2 F (36.8 C)     Temp Source 09/11/24 1608 Oral     SpO2 09/11/24 1608 94 %     Weight 09/11/24 1605 200 lb (90.7 kg)     Height 09/11/24 1605 5' 3 (1.6 m)     Head Circumference --      Peak Flow --      Pain Score --      Pain Loc --      Pain Education --      Exclude from Growth Chart --     Most recent vital signs: Vitals:   09/11/24 1608 09/11/24 2056  BP: (!) 104/54 (!) 146/75  Pulse: 92 94  Resp: 18 16  Temp: 98.2 F (36.8 C) 98.2 F (36.8 C)  SpO2: 94% 96%    Constitutional: Alert and oriented. Eyes: Conjunctivae are normal. Head: Atraumatic. Nose: No congestion/rhinnorhea. Mouth/Throat: Mucous membranes are moist.  Cardiovascular: Normal rate, regular rhythm. Grossly normal heart sounds.  2+ radial pulses bilaterally. Respiratory: Normal respiratory effort.  No retractions. Lungs CTAB. Gastrointestinal: Soft and  diffusely tender to palpation, greatest in the right upper quadrant. No distention. Musculoskeletal: No lower extremity tenderness nor edema.  Neurologic: Nonverbal.  Left hemiparesis noted.    ED Results / Procedures / Treatments   Labs (all labs ordered are listed, but only abnormal results are displayed) Labs Reviewed  COMPREHENSIVE METABOLIC PANEL WITH GFR - Abnormal; Notable for the following components:      Result Value   Glucose, Bld 128 (*)    Calcium 8.7 (*)    Albumin 3.2 (*)    All other components within normal limits  CBC - Abnormal; Notable for the following components:   WBC 13.2 (*)    Hemoglobin 10.1 (*)    HCT 33.5 (*)    RDW 16.3 (*)    Platelets 649 (*)    All other components within normal limits  LIPASE, BLOOD  URINALYSIS, ROUTINE W REFLEX MICROSCOPIC     EKG  ED ECG REPORT I, Carlin Palin, the attending physician, personally viewed and interpreted this ECG.   Date: 09/11/2024  EKG Time: 16:04  Rate: 93  Rhythm: normal sinus rhythm  Axis: Normal  Intervals:none  ST&T Change: None  PROCEDURES:  Critical Care performed: No  Procedures   MEDICATIONS ORDERED IN ED: Medications  iohexol  (OMNIPAQUE ) 300 MG/ML solution 100 mL (100 mLs Intravenous Contrast Given 09/11/24 2329)     IMPRESSION / MDM / ASSESSMENT AND PLAN / ED COURSE  I reviewed the triage vital signs and the nursing notes.                              69 y.o. female with past medical history of hypertension, asthma, stroke with left hemiparesis, and endometrial cancer who presents to the ED complaining of abdominal pain.  Patient's presentation is most consistent with acute presentation with potential threat to life or bodily function.  Differential diagnosis includes, but is not limited to, pancreatitis, hepatitis, cholecystitis, biliary colic, bowel obstruction, UTI, kidney stone, malignancy.  Patient chronically ill but nontoxic-appearing and in no acute distress,  vital signs are unremarkable.  Reviewing her chart, it does appear that patient is currently receiving hospice care due to advanced endometrial cancer being managed at [facility].  I attempted to reach both of her emergency contacts to discuss goals of care, but they did not answer multiple attempts.  Labs with mild leukocytosis but no significant anemia, electrolyte abnormality, or AKI.  LFTs and lipase are unremarkable.  We will further assess with CT imaging, patient turned over to oncoming provider pending CT results.      FINAL CLINICAL IMPRESSION(S) / ED DIAGNOSES   Final diagnoses:  Right upper quadrant abdominal pain     Rx / DC Orders   ED Discharge Orders     None        Note:  This document was prepared using Dragon voice recognition software and may include unintentional dictation errors.   Willo Dunnings, MD 09/11/24 [phone]  "

## 2024-09-11 NOTE — ED Notes (Signed)
 Pt returned from CT

## 2024-09-12 DIAGNOSIS — N3000 Acute cystitis without hematuria: Secondary | ICD-10-CM

## 2024-09-12 DIAGNOSIS — D649 Anemia, unspecified: Secondary | ICD-10-CM

## 2024-09-12 DIAGNOSIS — N390 Urinary tract infection, site not specified: Secondary | ICD-10-CM | POA: Diagnosis present

## 2024-09-12 DIAGNOSIS — R109 Unspecified abdominal pain: Secondary | ICD-10-CM

## 2024-09-12 DIAGNOSIS — K5641 Fecal impaction: Secondary | ICD-10-CM

## 2024-09-12 DIAGNOSIS — Z982 Presence of cerebrospinal fluid drainage device: Secondary | ICD-10-CM

## 2024-09-12 DIAGNOSIS — C541 Malignant neoplasm of endometrium: Secondary | ICD-10-CM | POA: Insufficient documentation

## 2024-09-12 LAB — CBC
HCT: 36.3 % (ref 36.0–46.0)
Hemoglobin: 10.7 g/dL — ABNORMAL LOW (ref 12.0–15.0)
MCH: 25.7 pg — ABNORMAL LOW (ref 26.0–34.0)
MCHC: 29.5 g/dL — ABNORMAL LOW (ref 30.0–36.0)
MCV: 87.3 fL (ref 80.0–100.0)
Platelets: 635 10*3/uL — ABNORMAL HIGH (ref 150–400)
RBC: 4.16 MIL/uL (ref 3.87–5.11)
RDW: 16.3 % — ABNORMAL HIGH (ref 11.5–15.5)
WBC: 14.6 10*3/uL — ABNORMAL HIGH (ref 4.0–10.5)
nRBC: 0 % (ref 0.0–0.2)

## 2024-09-12 LAB — URINALYSIS, ROUTINE W REFLEX MICROSCOPIC
Bilirubin Urine: NEGATIVE
Glucose, UA: NEGATIVE mg/dL
Ketones, ur: NEGATIVE mg/dL
Nitrite: POSITIVE — AB
Protein, ur: 30 mg/dL — AB
RBC / HPF: >50 RBC/hpf (ref 0–5)
Specific Gravity, Urine: 1.023 (ref 1.005–1.030)
WBC, UA: >50 WBC/hpf (ref 0–5)
pH: 6 (ref 5.0–8.0)

## 2024-09-12 LAB — BASIC METABOLIC PANEL WITH GFR
Anion gap: 12 (ref 5–15)
BUN: 11 mg/dL (ref 8–23)
CO2: 25 mmol/L (ref 22–32)
Calcium: 9 mg/dL (ref 8.9–10.3)
Chloride: 104 mmol/L (ref 98–111)
Creatinine, Ser: 0.84 mg/dL (ref 0.44–1.00)
GFR, Estimated: >60 mL/min
Glucose, Bld: 131 mg/dL — ABNORMAL HIGH (ref 70–99)
Potassium: 4.7 mmol/L (ref 3.5–5.1)
Sodium: 141 mmol/L (ref 135–145)

## 2024-09-12 MED ORDER — ADULT MULTIVITAMIN W/MINERALS CH
1.0000 | ORAL_TABLET | Freq: Every day | ORAL | Status: DC
  Administered 2024-09-12: 1 via ORAL
  Filled 2024-09-12: qty 1

## 2024-09-12 MED ORDER — LACTULOSE 10 GM/15ML PO SOLN
30.0000 g | Freq: Once | ORAL | Status: AC
  Administered 2024-09-12: 30 g via ORAL
  Filled 2024-09-12: qty 60

## 2024-09-12 MED ORDER — ONDANSETRON HCL 4 MG PO TABS: 4.0000 mg | ORAL_TABLET | Freq: Four times a day (QID) | ORAL | Status: DC | PRN

## 2024-09-12 MED ORDER — CIPROFLOXACIN HCL 500 MG PO TABS: 500.0000 mg | ORAL_TABLET | Freq: Two times a day (BID) | ORAL | 0 refills | Status: AC

## 2024-09-12 MED ORDER — ACETAMINOPHEN 325 MG RE SUPP: 650.0000 mg | Freq: Four times a day (QID) | RECTAL | Status: DC | PRN

## 2024-09-12 MED ORDER — ACETAMINOPHEN 325 MG PO TABS: 650.0000 mg | ORAL_TABLET | Freq: Four times a day (QID) | ORAL | Status: DC | PRN

## 2024-09-12 MED ORDER — POLYETHYLENE GLYCOL 3350 17 G PO PACK
17.0000 g | PACK | Freq: Every day | ORAL | Status: DC
  Administered 2024-09-12: 17 g via ORAL
  Filled 2024-09-12: qty 1

## 2024-09-12 MED ORDER — ENOXAPARIN SODIUM 60 MG/0.6ML IJ SOSY
0.5000 mg/kg | PREFILLED_SYRINGE | INTRAMUSCULAR | Status: DC
  Administered 2024-09-12: 45 mg via SUBCUTANEOUS

## 2024-09-12 MED ORDER — ONDANSETRON HCL 4 MG/2ML IJ SOLN: 4.0000 mg | Freq: Four times a day (QID) | INTRAMUSCULAR | Status: DC | PRN

## 2024-09-12 MED ORDER — MORPHINE SULFATE (PF) 4 MG/ML IV SOLN
4.0000 mg | INTRAVENOUS | Status: DC | PRN
  Administered 2024-09-12: 4 mg via INTRAVENOUS
  Filled 2024-09-12: qty 1

## 2024-09-12 MED ORDER — CITALOPRAM HYDROBROMIDE 20 MG PO TABS
10.0000 mg | ORAL_TABLET | Freq: Every day | ORAL | Status: DC
  Administered 2024-09-12: 10 mg via ORAL
  Filled 2024-09-12: qty 1

## 2024-09-12 MED ORDER — POLYETHYLENE GLYCOL 3350 17 G PO PACK: 34.0000 g | PACK | Freq: Every day | ORAL | Status: AC

## 2024-09-12 MED ORDER — VITAMIN D 25 MCG (1000 UNIT) PO TABS
2000.0000 [IU] | ORAL_TABLET | Freq: Every day | ORAL | Status: DC
  Administered 2024-09-12: 2000 [IU] via ORAL
  Filled 2024-09-12: qty 2

## 2024-09-12 MED ORDER — SODIUM CHLORIDE 0.9 % IV SOLN
1.0000 g | Freq: Once | INTRAVENOUS | Status: AC
  Administered 2024-09-12: 1 g via INTRAVENOUS
  Filled 2024-09-12: qty 10

## 2024-09-12 MED ORDER — CEFPODOXIME PROXETIL 200 MG PO TABS: 200.0000 mg | ORAL_TABLET | Freq: Two times a day (BID) | ORAL | Status: AC

## 2024-09-12 MED ORDER — PANTOPRAZOLE SODIUM 40 MG PO TBEC
40.0000 mg | DELAYED_RELEASE_TABLET | Freq: Every day | ORAL | Status: DC
  Administered 2024-09-12: 40 mg via ORAL
  Filled 2024-09-12: qty 1

## 2024-09-12 MED ORDER — SCOPOLAMINE 1 MG/3DAYS TD PT72
1.0000 | MEDICATED_PATCH | TRANSDERMAL | Status: DC
  Administered 2024-09-12: 1 mg via TRANSDERMAL
  Filled 2024-09-12: qty 1

## 2024-09-12 MED ORDER — OXYCODONE HCL 5 MG PO TABS: 10.0000 mg | ORAL_TABLET | ORAL | Status: DC | PRN

## 2024-09-12 MED ORDER — SENNOSIDES-DOCUSATE SODIUM 8.6-50 MG PO TABS: 1.0000 | ORAL_TABLET | Freq: Every day | ORAL | Status: DC

## 2024-09-12 MED ORDER — METRONIDAZOLE 500 MG/100ML IV SOLN
500.0000 mg | Freq: Once | INTRAVENOUS | Status: AC
  Administered 2024-09-12: 500 mg via INTRAVENOUS
  Filled 2024-09-12: qty 100

## 2024-09-12 NOTE — Assessment & Plan Note (Addendum)
 Likely multifactorial and related to UTI, possible stercoral colitis as well as pain from untreated endometrial carcinoma Pain control Treat UTI--> see respective problem Treat constipation/fecal impaction--> on the respective problem

## 2024-09-12 NOTE — ED Provider Notes (Signed)
 Not received pending CT scan and urinalysis.  Apparently she has been dealing with pain in her abdomen.  Patient is nonverbal and I really cannot elicit any sort of response from her as to her chief complaints, she does make good eye contact but does not make any noises nor is able to nod or shake her head to communicate.  The CT scan did result with findings of known endometrial carcinoma, metastases, and a couple of renal stones in the renal pelvis bilaterally with urothelial thickening and hyperenhancement which was thought maybe to be a result of urinary tract infection, noted not to have hydronephrosis, and also a moderate stool ball in the rectal area with mild perirectal fat stranding  I went back to assess the patient but given her nonverbal status I could not obtain consent for full manual disimpaction.  Will try oral bowel regiment as well as a suppository or enema to start.  Will treat with a antibiotic ceftriaxone /Flagyl  which should treat for both enteric flora as well as presumed UTI.  The renal stones do not appear to be obstructive will defer urology consultation at this time of the night as it is not appear to be a urologic emergency.  I suspect abdominal pain is due to her cancer.  Obtained a urine sample which looks cloudy and purulent and foul-smelling I do suspect urine infection.  +UTI   Desiree Cyrena Mylar, MD 09/12/24 [phone]

## 2024-09-12 NOTE — Progress Notes (Signed)
 Anticoagulation monitoring(Lovenox ):  69 yo female ordered Lovenox  40 mg Q24h    Filed Weights   09/11/24 1605  Weight: 90.7 kg (200 lb)   BMI 35.4    Lab Results  Component Value Date   CREATININE 0.86 09/11/2024   CREATININE 0.77 05/06/2024   CREATININE 0.78 11/03/2023   Estimated Creatinine Clearance: 68.8 mL/min (by C-G formula based on SCr of 0.86 mg/dL). Hemoglobin & Hematocrit     Component Value Date/Time   HGB 10.1 (L) 09/11/2024 1607   HGB 13.2 11/12/2011 0931   HCT 33.5 (L) 09/11/2024 1607   HCT 38.7 11/12/2011 0931     Per Protocol for Patient with estCrcl > 30 ml/min and BMI > 30, will transition to Lovenox  45 mg Q24h.

## 2024-09-12 NOTE — Assessment & Plan Note (Signed)
 Patient currently under hospice [facility]

## 2024-09-12 NOTE — Assessment & Plan Note (Addendum)
 Possible stercoral colitis Unable to get consented for fecal disimpaction from the ED Bowel regimen, lactulose  x 1, daily MiraLAX , consider Senokot nightly Continue Rocephin  and Flagyl 

## 2024-09-12 NOTE — Progress Notes (Signed)
 [facility] liaison note     This patient is a current hospice patient with [facility]. Please see media tab for detailed hospice report.    Liaison will continue to follow for any discharge planning needs and to coordinate continuation of hospice care.    Please don't hesitate to call with any Hospice related questions or concerns.    Thank you for the opportunity to participate in this patient's care.  Greig Basket, BSN, RN [facility] Liaison [phone]

## 2024-09-12 NOTE — H&P (Addendum)
 " History and Physical    Patient: Desiree Mitchell FMW:[id number] DOB: 03-12-1956 DOA: 09/11/2024 DOS: the patient was seen and examined on 09/12/2024 PCP: Cletus Glenn  Patient coming from: Home  Chief Complaint:  Chief Complaint  Patient presents with   Abdominal Pain    HPI: Desiree Mitchell is a 69 y.o. 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  Patient, who is on hospice, was sent from her facility for evaluation of abdominal pain.  Patient will answer simple questions with a few words or nod and shake her head, stare blankly.  She denies constipation, nausea or vomiting.  Points to her lower abdomen when asked where she is having pain  In the ED, soft BP of 104/54 on arrival that improved on its own.  Other vitals within normal limits. Labs notable for WBC 13,000 with UA strongly consistent with UTI.  Noted hemoglobin of 10.1, up from 8.2 about 4 months prior.  CMP unremarkable. EKG showed NSR at 93 CT abdomen and pelvis showed known enlarged uterus with retroperitoneal nodal metastases, nephrolithiasis without hydronephrosis, urothelial thickening as well as moderate stool ball in the rectum with mild perirectal fat stranding.  Patient treated with Rocephin  and metronidazole , given morphine  for pain. Could not obtain consent for fecal disimpaction so not done in the ED  Admission requested:     Past Medical History:  Diagnosis Date   Asthma    Breast cancer (HCC)    CHF (congestive heart failure) (HCC)    Constipation    GERD (gastroesophageal reflux disease)    Hemiplegia affecting left nondominant side (HCC)    Hydrocephalus (HCC)    Hypertension    ICH (intracerebral  hemorrhage) (HCC)    non traumatic   Peripheral vascular disease    Stroke [facility])    Thyroid disease    nontoxic single thyroid nodule   Past Surgical History:  Procedure Laterality Date   BRAIN SURGERY     stent in head   MASTECTOMY Bilateral    SHUNT REMOVAL Left 05/21/2021   Procedure: SHUNT REMOVAL;  Surgeon: Clois Fret, MD;  Location: [facility] ORS;  Service: Neurosurgery;  Laterality: Left;   TEE WITHOUT CARDIOVERSION N/A 05/29/2021   Procedure: TRANSESOPHAGEAL ECHOCARDIOGRAM (TEE);  Surgeon: Hester Wolm PARAS, MD;  Location: [facility] ORS;  Service: Cardiovascular;  Laterality: N/A;   VENTRICULOSTOMY Left 05/21/2021   Procedure: VENTRICULOSTOMY;  Surgeon: Clois Fret, MD;  Location: [facility] ORS;  Service: Neurosurgery;  Laterality: Left;   Social History:  reports that she has quit smoking. Her smoking use included cigarettes. She has quit using smokeless tobacco. She reports that she does not drink alcohol . No history on file for drug use.  Allergies[1]  Family History  Problem Relation Age of Onset   Cancer Mother    Diabetes Father    Cancer Father     Prior to Admission medications  Medication Sig Start Date End Date Taking? Authorizing Provider  amLODipine  (NORVASC ) 10 MG tablet Take 1 tablet (10 mg total) by mouth daily. 07/11/21  Yes Sreenath, Sudheer B, MD  bisacodyl (DULCOLAX) 5 MG EC tablet Take 5 mg by mouth 2 (two) times daily. 04/07/24  Yes [provider]  Cholecalciferol  50 MCG (2000 UT) CAPS Take 2,000 Units by mouth daily. 10/22/21  Yes [provider]  citalopram  (CELEXA ) 10 MG tablet Take 10 mg by mouth daily.   Yes [provider]  docusate sodium  (COLACE) 100 MG capsule Take 1 capsule (100 mg total) by mouth 2 (two) times daily. 07/11/21  Yes Jhonny Calvin NOVAK, MD  metoprolol  tartrate (LOPRESSOR ) 25 MG tablet Take 1 tablet (25 mg total) by mouth 2 (two) times daily. 07/11/21  Yes Sreenath, Sudheer B, MD  Multiple  Vitamins-Minerals (ONE-DAILY MULTI-VIT/MINERAL) TABS Take 1 tablet by mouth daily. 05/19/24  Yes [provider]  ondansetron  (ZOFRAN ) 4 MG tablet Take 4 mg by mouth every 6 (six) hours as needed for nausea or vomiting. 08/31/24  Yes [provider]  Oxycodone  HCl 10 MG TABS Take 10 mg by mouth every 12 (twelve) hours. 09/08/24  Yes [provider]  pantoprazole  (PROTONIX ) 40 MG tablet Take 1 tablet (40 mg total) by mouth daily. 07/11/21  Yes Sreenath, Sudheer B, MD  scopolamine  (TRANSDERM-SCOP) 1 MG/3DAYS Place 1 patch onto the skin every 3 (three) days. 07/02/24  Yes [provider]  metoprolol  succinate (TOPROL -XL) 50 MG 24 hr tablet Take 1 tablet (50 mg total) by mouth every morning. Take with or immediately following a meal. 04/06/13 01/16/15  Kara Reaper, MD    Physical Exam: Vitals:   09/11/24 1608 09/11/24 2056 09/12/24 0030 09/12/24 0117  BP: (!) 104/54 (!) 146/75 129/64   Pulse: 92 94 84   Resp: 18 16 16    Temp: 98.2 F (36.8 C) 98.2 F (36.8 C)  98.1 F (36.7 C)  TempSrc: Oral Oral  Oral  SpO2: 94% 96% 95%   Weight:      Height:       Physical Exam Vitals and nursing note reviewed.  Constitutional:      General: She is awake. She is not in acute distress.    Comments: Answer simple questions mostly with a nod or shake of the head.  Appears to not understand some questions and will stare blankly  HENT:     Head: Normocephalic and atraumatic.  Cardiovascular:     Rate and Rhythm: Normal rate and regular rhythm.     Heart sounds: Normal heart sounds.  Pulmonary:     Effort: Pulmonary effort is normal.     Breath sounds: Normal breath sounds.  Abdominal:     Palpations: Abdomen is soft.     Tenderness: There is abdominal tenderness in the periumbilical area and suprapubic area.  Neurological:     Mental Status: She is alert.     Comments: Right hemiplegia     Labs on Admission: I have personally reviewed following labs and imaging  studies  CBC: Recent Labs  Lab 09/11/24 1607  WBC 13.2*  HGB 10.1*  HCT 33.5*  MCV 86.3  PLT 649*   Basic Metabolic Panel: Recent Labs  Lab 09/11/24 1607  NA 140  K 4.2  CL 103  CO2 24  GLUCOSE 128*  BUN 13  CREATININE 0.86  CALCIUM 8.7*   GFR: Estimated Creatinine Clearance: 68.8 mL/min (by C-G formula based on SCr of 0.86 mg/dL). Liver Function Tests: Recent Labs  Lab 09/11/24 1607  AST 19  ALT <5  ALKPHOS 73  BILITOT 0.3  PROT 7.6  ALBUMIN 3.2*   Recent Labs  Lab 09/11/24 1607  LIPASE 24   No results for input(s): AMMONIA in the last 168 hours. Coagulation Profile: No results for input(s): INR, PROTIME in the last 168 hours. Cardiac Enzymes: No results  for input(s): CKTOTAL, CKMB, CKMBINDEX, TROPONINI in the last 168 hours. BNP (last 3 results) No results for input(s): PROBNP in the last 8760 hours. HbA1C: No results for input(s): HGBA1C in the last 72 hours. CBG: No results for input(s): GLUCAP in the last 168 hours. Lipid Profile: No results for input(s): CHOL, HDL, LDLCALC, TRIG, CHOLHDL, LDLDIRECT in the last 72 hours. Thyroid Function Tests: No results for input(s): TSH, T4TOTAL, FREET4, T3FREE, THYROIDAB in the last 72 hours. Anemia Panel: No results for input(s): VITAMINB12, FOLATE, FERRITIN, TIBC, IRON, RETICCTPCT in the last 72 hours. Urine analysis:    Component Value Date/Time   COLORURINE AMBER (A) 09/12/2024 0046   APPEARANCEUR CLOUDY (A) 09/12/2024 0046   LABSPEC 1.023 09/12/2024 0046   PHURINE 6.0 09/12/2024 0046   GLUCOSEU NEGATIVE 09/12/2024 0046   HGBUR LARGE (A) 09/12/2024 0046   BILIRUBINUR NEGATIVE 09/12/2024 0046   KETONESUR NEGATIVE 09/12/2024 0046   PROTEINUR 30 (A) 09/12/2024 0046   UROBILINOGEN 1.0 11/06/2012 0046   NITRITE POSITIVE (A) 09/12/2024 0046   LEUKOCYTESUR LARGE (A) 09/12/2024 0046    Radiological Exams on Admission: CT ABDOMEN PELVIS W  CONTRAST Result Date: 09/11/2024 EXAM: CT ABDOMEN AND PELVIS WITH CONTRAST 09/11/2024 11:40:31 PM TECHNIQUE: CT of the abdomen and pelvis was performed with the administration of 100 mL of iohexol  (OMNIPAQUE ) 300 MG/ML solution. Multiplanar reformatted images are provided for review. Automated exposure control, iterative reconstruction, and/or weight-based adjustment of the mA/kV was utilized to reduce the radiation dose to as low as reasonably achievable. COMPARISON: 05/21/2021 CLINICAL HISTORY: Abdominal pain, acute, nonlocalized. Acute, nonlocalized abdominal pain. FINDINGS: LOWER CHEST: No acute abnormality. LIVER: The liver is unremarkable. GALLBLADDER AND BILE DUCTS: Cholelithiasis. No evidence of acute cholecystitis. No biliary ductal dilatation. SPLEEN: No acute abnormality. PANCREAS: No acute abnormality. ADRENAL GLANDS: No acute abnormality. KIDNEYS, URETERS AND BLADDER: Left kidney: 8 mm stone in the left renal pelvis. There is urothelial thickening and hyperenhancement of the left renal pelvis. No hydronephrosis. Right kidney: 11 mm stone in the right renal pelvis with urothelial thickening and hyper-enhancement of the right renal pelvis. No hydronephrosis. Urinary bladder is unremarkable. GI AND BOWEL: Stomach demonstrates no acute abnormality. Moderate stool ball in the rectum. Mild perirectal fat stranding. There is no bowel obstruction. PERITONEUM AND RETROPERITONEUM: Small volume free fluid in the pelvis. No abscess. No free intraperitoneal air. VASCULATURE: Aorta is normal in caliber. Aortic atherosclerotic calcification. LYMPH NODES: Retroperitoneal lymphadenopathy. For example 2.1 cm left iliac chain node on series 2 image 74 and 1.3 cm precaval node on series 2 image 50. REPRODUCTIVE ORGANS: Enlarged uterus. Heterogeneous thickened endometrium with endometrial canal measuring 8.1 cm. Irregular wall thickening about the endometrium extending into the cervix compatible with known endometrial  carcinoma. BONES AND SOFT TISSUES: No acute osseous abnormality. No focal soft tissue abnormality. IMPRESSION: 1. Enlarged uterus with marked dilation of the endometrial canal secondary to known endometrial carcinoma. Retroperitoneal nodal metastases. 2. 8 mm stone in the left renal pelvis and 11 mm stone in the right renal pelvis with associated urothelial thickening and hyperenhancement. This may be due to ascending urinary tract infection or inflammation secondary to the pelvic stones. No hydronephrosis. 3. Moderate stool ball in the rectum with mild perirectal fat stranding . Correlate for fecal impaction. Electronically signed by: Norman Gatlin MD 09/11/2024 11:55 PM EST RP Workstation: [id number]   Data Reviewed for HPI: Relevant notes from primary care and specialist visits, past discharge summaries as available in EHR, including Care Everywhere. Prior diagnostic testing  as pertinent to current admission diagnoses Updated medications and problem lists for reconciliation ED course, including vitals, labs, imaging, treatment and response to treatment Triage notes, nursing and pharmacy notes and ED provider's notes Notable results as noted above in HPI      Assessment and Plan: * Urinary tract infection Presented with abdominal pain Rocephin  Follow urine culture  Fecal impaction in rectum (HCC) Possible stercoral colitis Unable to get consented for fecal disimpaction from the ED Bowel regimen, lactulose  x 1, daily MiraLAX , consider Senokot nightly Continue Rocephin  and Flagyl   Abdominal pain Likely multifactorial and related to UTI, possible stercoral colitis as well as pain from untreated endometrial carcinoma Pain control Treat UTI--> see respective problem Treat constipation/fecal impaction--> on the respective problem  HTN (hypertension) Resume amlodipine  once BP remains stable  Chronic anemia Hemoglobin improved from recent baseline now 10.1 was 8.2 about 4 months  ago  History of breast cancer (2012) s/p chemo/bilateral mastectomies Endometrial carcinoma(02/2024)  / retroperitoneal nodal metastases on CT Currently on hospice/palliative care  Ventricular shunt in place History of cerebral aneurysm(2007) / subarachnoid hemorrhage / hydrocephalus No acute issues suspected Patient currently on hospice  Hospice care patient Patient currently under hospice [facility]    DVT prophylaxis: Lovenox   Consults: none  Advance Care Planning:   Code Status: Prior   Family Communication: none  Disposition Plan: Back to previous home environment  Severity of Illness: The appropriate patient status for this patient is OBSERVATION. Observation status is judged to be reasonable and necessary in order to provide the required intensity of service to ensure the patient's safety. The patient's presenting symptoms, physical exam findings, and initial radiographic and laboratory data in the context of their medical condition is felt to place them at decreased risk for further clinical deterioration. Furthermore, it is anticipated that the patient will be medically stable for discharge from the hospital within 2 midnights of admission.   Author: Delayne LULLA Solian, MD 09/12/2024 2:37 AM  For on call review [email or web address].      [1]  Allergies Allergen Reactions   Chlorhexidine  Rash   Tape     blisters   "

## 2024-09-12 NOTE — Assessment & Plan Note (Signed)
 Hemoglobin improved from recent baseline now 10.1 was 8.2 about 4 months ago

## 2024-09-12 NOTE — ED Notes (Signed)
 Pt provided with peri care and clean dry brief d/t urinary incontinence.

## 2024-09-12 NOTE — Discharge Summary (Signed)
 [name] FMW:[id number] DOB: 1956-04-16 DOA: 09/11/2024  PCP: [facility]  Admit date: 09/11/2024 Discharge date: 09/12/2024  Time spent: 35 minutes  Recommendations for Outpatient Follow-up:  F/u hospice team and with pcp     Discharge Diagnoses:  Principal Problem:   Urinary tract infection Active Problems:   Fecal impaction in rectum (HCC)   HTN (hypertension)   Abdominal pain   Chronic anemia   History of breast cancer (2012) s/p chemo/bilateral mastectomies   Ventricular shunt in place   Hospice care patient   Obesity   Acute embolic stroke [facility])   Endometrial cancer (HCC)   Discharge Condition: stable  Diet recommendation: regular  Filed Weights   09/11/24 1605  Weight: 90.7 kg    History of present illness:   Desiree Mitchell is a 69 y.o. 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  Patient, who is on hospice, was sent from her facility for evaluation of abdominal pain.  Patient will answer simple questions with a few words or nod and shake her head, stare blankly.  She denies constipation, nausea or vomiting.  Points to her lower abdomen when asked where she is having pain  In the ED, soft BP of 104/54 on arrival that improved on its own.  Other vitals within normal limits. Labs notable for WBC 13,000 with UA strongly consistent with UTI.  Noted hemoglobin of 10.1, up from 8.2 about 4 months prior.  CMP unremarkable. EKG showed NSR at 93 CT abdomen and pelvis showed known enlarged uterus with retroperitoneal nodal metastases, nephrolithiasis without hydronephrosis, urothelial thickening as well as moderate stool ball in the rectum with mild perirectal fat  stranding.   Patient treated with Rocephin  and metronidazole , given morphine  for pain. Could not obtain consent for fecal disimpaction so not done in the ED  Hospital Course:   On hospice but with MOST form describing interest in limited interventions such as antibiotics, non-verbal after stroke, has metastatic endometrial cancer, bedbound at baseline, presenting with abdominal pain. CT showing rectal fecal impaction and possible ascending uti. Afebrile, mild leukocytosis that appears to be patient's baseline. Neither EDP nor admitting provider performed fecal disimpaction so this was performed by discharging provider. For the UTI patient was started on ceftriaxone , urine culture not ordered. Discharging provider has ordered urine culture as add-on, will treat for possible ascending infection with cipro  given urine culture from 2022 that grew e coli and pseudomonas. Advise more aggressive bowel regimen, will add miralax . Discharge plan reviewed with patient's Daughter, Desiree Mitchell, who is in agreement.   Procedures: Fecal disimpaction   Consultations: none  Discharge Exam: Vitals:   09/12/24 1230 09/12/24 1330  BP: 121/74 105/65  Pulse: 86 (!) 107  Resp:  16  Temp:    SpO2: 93% 97%    General: NAD, non-verbal Cardiovascular: rrr Respiratory: ctab Large amount of brown stool manually removed from rectum (nurse chaperone present)  Discharge Instructions   Discharge Instructions     Diet general   Complete by: As directed    Increase activity slowly   Complete by: As directed       Allergies as of 09/12/2024       Reactions   Chlorhexidine  Rash   Tape    blisters  Medication List     TAKE these medications    amLODipine  10 MG tablet Commonly known as: NORVASC  Take 1 tablet (10 mg total) by mouth daily.   cefpodoxime  200 MG tablet Commonly known as: VANTIN  Take 1 tablet (200 mg total) by mouth 2 (two) times daily for 6 days.   Cholecalciferol  50 MCG (2000  UT) Caps Take 2,000 Units by mouth daily.   citalopram  10 MG tablet Commonly known as: CELEXA  Take 10 mg by mouth daily.   docusate sodium  100 MG capsule Commonly known as: COLACE Take 1 capsule (100 mg total) by mouth 2 (two) times daily.   Dulcolax 5 MG EC tablet Generic drug: bisacodyl Take 5 mg by mouth 2 (two) times daily.   metoprolol  tartrate 25 MG tablet Commonly known as: LOPRESSOR  Take 1 tablet (25 mg total) by mouth 2 (two) times daily.   ondansetron  4 MG tablet Commonly known as: ZOFRAN  Take 4 mg by mouth every 6 (six) hours as needed for nausea or vomiting.   One-Daily Multi-Vit/Mineral Tabs Take 1 tablet by mouth daily.   Oxycodone  HCl 10 MG Tabs Take 10 mg by mouth every 12 (twelve) hours.   pantoprazole  40 MG tablet Commonly known as: PROTONIX  Take 1 tablet (40 mg total) by mouth daily.   polyethylene glycol 17 g packet Commonly known as: MIRALAX  / GLYCOLAX  Take 34 g by mouth daily.   scopolamine  1 MG/3DAYS Commonly known as: TRANSDERM-SCOP Place 1 patch onto the skin every 3 (three) days.       Allergies[1]  Follow-up Information     [facility] Follow up.   Contact information: [address] [place] [address] [phone]         [facility] Follow up.                   The results of significant diagnostics from this hospitalization (including imaging, microbiology, ancillary and laboratory) are listed below for reference.    Significant Diagnostic Studies: CT ABDOMEN PELVIS W CONTRAST Result Date: 09/11/2024 EXAM: CT ABDOMEN AND PELVIS WITH CONTRAST 09/11/2024 11:40:31 PM TECHNIQUE: CT of the abdomen and pelvis was performed with the administration of 100 mL of iohexol  (OMNIPAQUE ) 300 MG/ML solution. Multiplanar reformatted images are provided for review. Automated exposure control, iterative reconstruction, and/or weight-based adjustment of the mA/kV was utilized to reduce the radiation dose to as low  as reasonably achievable. COMPARISON: 05/21/2021 CLINICAL HISTORY: Abdominal pain, acute, nonlocalized. Acute, nonlocalized abdominal pain. FINDINGS: LOWER CHEST: No acute abnormality. LIVER: The liver is unremarkable. GALLBLADDER AND BILE DUCTS: Cholelithiasis. No evidence of acute cholecystitis. No biliary ductal dilatation. SPLEEN: No acute abnormality. PANCREAS: No acute abnormality. ADRENAL GLANDS: No acute abnormality. KIDNEYS, URETERS AND BLADDER: Left kidney: 8 mm stone in the left renal pelvis. There is urothelial thickening and hyperenhancement of the left renal pelvis. No hydronephrosis. Right kidney: 11 mm stone in the right renal pelvis with urothelial thickening and hyper-enhancement of the right renal pelvis. No hydronephrosis. Urinary bladder is unremarkable. GI AND BOWEL: Stomach demonstrates no acute abnormality. Moderate stool ball in the rectum. Mild perirectal fat stranding. There is no bowel obstruction. PERITONEUM AND RETROPERITONEUM: Small volume free fluid in the pelvis. No abscess. No free intraperitoneal air. VASCULATURE: Aorta is normal in caliber. Aortic atherosclerotic calcification. LYMPH NODES: Retroperitoneal lymphadenopathy. For example 2.1 cm left iliac chain node on series 2 image 74 and 1.3 cm precaval node on series 2 image 50. REPRODUCTIVE ORGANS: Enlarged uterus. Heterogeneous thickened  endometrium with endometrial canal measuring 8.1 cm. Irregular wall thickening about the endometrium extending into the cervix compatible with known endometrial carcinoma. BONES AND SOFT TISSUES: No acute osseous abnormality. No focal soft tissue abnormality. IMPRESSION: 1. Enlarged uterus with marked dilation of the endometrial canal secondary to known endometrial carcinoma. Retroperitoneal nodal metastases. 2. 8 mm stone in the left renal pelvis and 11 mm stone in the right renal pelvis with associated urothelial thickening and hyperenhancement. This may be due to ascending urinary tract  infection or inflammation secondary to the pelvic stones. No hydronephrosis. 3. Moderate stool ball in the rectum with mild perirectal fat stranding . Correlate for fecal impaction. Electronically signed by: Norman Gatlin MD 09/11/2024 11:55 PM EST RP Workstation: [id number]    Microbiology: No results found for this or any previous visit (from the past 240 hours).   Labs: Basic Metabolic Panel: Recent Labs  Lab 09/11/24 1607 09/12/24 0501  NA 140 141  K 4.2 4.7  CL 103 104  CO2 24 25  GLUCOSE 128* 131*  BUN 13 11  CREATININE 0.86 0.84  CALCIUM 8.7* 9.0   Liver Function Tests: Recent Labs  Lab 09/11/24 1607  AST 19  ALT <5  ALKPHOS 73  BILITOT 0.3  PROT 7.6  ALBUMIN 3.2*   Recent Labs  Lab 09/11/24 1607  LIPASE 24   No results for input(s): AMMONIA in the last 168 hours. CBC: Recent Labs  Lab 09/11/24 1607 09/12/24 0501  WBC 13.2* 14.6*  HGB 10.1* 10.7*  HCT 33.5* 36.3  MCV 86.3 87.3  PLT 649* 635*   Cardiac Enzymes: No results for input(s): CKTOTAL, CKMB, CKMBINDEX, TROPONINI in the last 168 hours. BNP: BNP (last 3 results) No results for input(s): BNP in the last 8760 hours.  ProBNP (last 3 results) No results for input(s): PROBNP in the last 8760 hours.  CBG: No results for input(s): GLUCAP in the last 168 hours.     Signed:  Devaughn KATHEE Ban MD.  [facility] 09/12/2024, 1:42 PM     [1]  Allergies Allergen Reactions   Chlorhexidine  Rash   Tape     blisters

## 2024-09-12 NOTE — TOC Transition Note (Signed)
 Transition of Care [facility]) - Discharge Note   Patient Details  Name: Desiree Mitchell MRN: [id number] Date of Birth: 1955/11/21  Transition of Care [facility]) CM/SW Contact:  Victory Jackquline RAMAN, RN Phone Number: 09/12/2024, 1:56 PM   Clinical Narrative:    Patient medically stable for discharge back to [facility]. RNCM received a call from leadership informing me that they had spoke to [employer] @ [facility] and they approved for her to return to the facility today. RNCM set up transportation with Octaviano at [facility] and they are on the way to pick up patient. MD and bedside nurse made aware. Pt has discharge orders, no further concerns. RNCM signing off.   Final next level of care: Skilled Nursing Facility Barriers to Discharge: Barriers Resolved   Patient Goals and CMS Choice            Discharge Placement                Patient to be transferred to facility by: [facility] Name of family member notified: [name] Patient and family notified of of transfer: 09/12/24  Discharge Plan and Services Additional resources added to the After Visit Summary for                                       Social Drivers of Health (SDOH) Interventions SDOH Screenings   Housing: Unknown (12/30/2023)   Received from [facility]  Tobacco Use: Medium Risk (09/11/2024)     Readmission Risk Interventions     No data to display

## 2024-09-12 NOTE — Assessment & Plan Note (Deleted)
 Retroperitoneal nodal metastases on CT Currently on palliative care/hospice

## 2024-09-12 NOTE — Hospital Course (Addendum)
 Desiree Mitchell

## 2024-09-12 NOTE — Assessment & Plan Note (Addendum)
 Presented with abdominal pain Rocephin  Follow urine culture

## 2024-09-12 NOTE — ED Notes (Signed)
 This RN at bedside to chaperon disimpaction by admission MD

## 2024-09-13 LAB — HIV ANTIBODY (ROUTINE TESTING W REFLEX): HIV Screen 4th Generation wRfx: NONREACTIVE

## 2024-09-15 LAB — URINE CULTURE: Culture: >=100000 — AB

## 2024-09-16 ENCOUNTER — Other Ambulatory Visit: Payer: Self-pay

## 2024-09-16 NOTE — Progress Notes (Addendum)
 ED Antimicrobial Stewardship Positive Culture Follow Up   Desiree Mitchell is an 69 y.o. female who presented to [facility] on 09/11/2024 with a chief complaint of  Chief Complaint  Patient presents with   Abdominal Pain    Recent Results (from the past 720 hours)  Urine Culture (for pregnant, neutropenic or urologic patients or patients with an indwelling urinary catheter)     Status: Abnormal   Collection Time: 09/12/24 12:46 AM   Specimen: Urine, Clean Catch  Result Value Ref Range Status   Specimen Description   Final    URINE, CLEAN CATCH Performed at [facility], [address], [place], [place] [address]    Special Requests   Final    NONE Performed at [facility], [address] [place], [place] [address]    Culture (A)  Final    >=100,000 COLONIES/mL ESCHERICHIA COLI Confirmed Extended Spectrum Beta-Lactamase Producer (ESBL).  In bloodstream infections from ESBL organisms, carbapenems are preferred over piperacillin/tazobactam. They are shown to have a lower risk of mortality.    Report Status 09/15/2024 FINAL  Final   Organism ID, Bacteria ESCHERICHIA COLI (A)  Final      Susceptibility   Escherichia coli - MIC*    AMPICILLIN >=32 RESISTANT Resistant     CEFAZOLIN (URINE) Value in next row Resistant      >=32 RESISTANTThis is a modified FDA-approved test that has been validated and its performance characteristics determined by the reporting laboratory.  This laboratory is certified under the Clinical Laboratory Improvement Amendments CLIA as qualified to perform high complexity clinical laboratory testing.    CEFEPIME  Value in next row Intermediate      >=32 RESISTANTThis is a modified FDA-approved test that has been validated and its performance characteristics determined by the reporting laboratory.  This laboratory is certified under the Clinical Laboratory Improvement Amendments CLIA as qualified to perform high complexity clinical laboratory  testing.    ERTAPENEM Value in next row Sensitive      >=32 RESISTANTThis is a modified FDA-approved test that has been validated and its performance characteristics determined by the reporting laboratory.  This laboratory is certified under the Clinical Laboratory Improvement Amendments CLIA as qualified to perform high complexity clinical laboratory testing.    CEFTRIAXONE  Value in next row Resistant      >=32 RESISTANTThis is a modified FDA-approved test that has been validated and its performance characteristics determined by the reporting laboratory.  This laboratory is certified under the Clinical Laboratory Improvement Amendments CLIA as qualified to perform high complexity clinical laboratory testing.    CIPROFLOXACIN  Value in next row Resistant      >=32 RESISTANTThis is a modified FDA-approved test that has been validated and its performance characteristics determined by the reporting laboratory.  This laboratory is certified under the Clinical Laboratory Improvement Amendments CLIA as qualified to perform high complexity clinical laboratory testing.    GENTAMICIN Value in next row Sensitive      >=32 RESISTANTThis is a modified FDA-approved test that has been validated and its performance characteristics determined by the reporting laboratory.  This laboratory is certified under the Clinical Laboratory Improvement Amendments CLIA as qualified to perform high complexity clinical laboratory testing.    NITROFURANTOIN Value in next row Sensitive      >=32 RESISTANTThis is a modified FDA-approved test that has been validated and its performance characteristics determined by the reporting laboratory.  This laboratory is certified under the Clinical Laboratory Improvement Amendments CLIA as  qualified to perform high complexity clinical laboratory testing.    TRIMETH/SULFA Value in next row Sensitive      >=32 RESISTANTThis is a modified FDA-approved test that has been validated and its performance  characteristics determined by the reporting laboratory.  This laboratory is certified under the Clinical Laboratory Improvement Amendments CLIA as qualified to perform high complexity clinical laboratory testing.    AMPICILLIN/SULBACTAM Value in next row Sensitive      >=32 RESISTANTThis is a modified FDA-approved test that has been validated and its performance characteristics determined by the reporting laboratory.  This laboratory is certified under the Clinical Laboratory Improvement Amendments CLIA as qualified to perform high complexity clinical laboratory testing.    PIP/TAZO Value in next row Sensitive      <=4 SENSITIVEThis is a modified FDA-approved test that has been validated and its performance characteristics determined by the reporting laboratory.  This laboratory is certified under the Clinical Laboratory Improvement Amendments CLIA as qualified to perform high complexity clinical laboratory testing.    MEROPENEM Value in next row Sensitive      <=4 SENSITIVEThis is a modified FDA-approved test that has been validated and its performance characteristics determined by the reporting laboratory.  This laboratory is certified under the Clinical Laboratory Improvement Amendments CLIA as qualified to perform high complexity clinical laboratory testing.    * >=100,000 COLONIES/mL ESCHERICHIA COLI    [x]  Treated with cefpodoxime  200 mg tab, and ciprofloxacin  500 mg tab, organism resistant to prescribed antimicrobials  Patient was discharged from [facility] ED on 2/1. Urine cultures came back positive for ESBL E. Coli resistant to discharge antibiotics. Discussed with ED provider, and planned to change antibiotics to TMP/SMX if symptoms not worsening - if symptoms worsening were going to recommend coming back to ED for re-evaluation.  Patient is non-verbal s/p stroke. Called daughter but has not seen patient since ED discharge. Was told patient is a resident at [facility] in [place] - spoke with RN  Davetta who mentioned that the facility's provider changed ciprofloxacin  to nitrofurantoin and kept cefpodoxime . Requested that RN relay CT imaging concerns for possible ascending tract UTI (pyelonephritis), which nitrofurantoin does not adequately treat, to the facility's provider. RN agreeable and requested CT imaging results to be faxed to [phone].   RN called back before fax that facility was able to obtain CT results. Spoke with facility's NP Jolynn) and communicated recommendations, and agreeable with plan to change antibiotics to TMP/SMX as patient's symptoms stable/not worsening.  New antibiotic prescription: TMP/SMX 1 DS tab BID x 5 days  ED Provider: Dr. Jacolyn Leonor JAYSON Viviana, PharmD Pharmacy Resident  09/16/2024 12:07 PM
